# Patient Record
Sex: Male | Born: 1935 | Race: White | Hispanic: No | Marital: Married | Smoking: Former smoker
Health system: Southern US, Community
[De-identification: ages and names within clinical notes are randomized; demographics above are authoritative.]

## PROBLEM LIST (undated history)

## (undated) DIAGNOSIS — I340 Nonrheumatic mitral (valve) insufficiency: Secondary | ICD-10-CM

## (undated) DIAGNOSIS — I10 Essential (primary) hypertension: Secondary | ICD-10-CM

## (undated) DIAGNOSIS — I4729 Other ventricular tachycardia: Secondary | ICD-10-CM

## (undated) DIAGNOSIS — N183 Chronic kidney disease, stage 3 unspecified: Secondary | ICD-10-CM

## (undated) DIAGNOSIS — Z7901 Long term (current) use of anticoagulants: Secondary | ICD-10-CM

## (undated) DIAGNOSIS — I472 Ventricular tachycardia, unspecified: Secondary | ICD-10-CM

## (undated) DIAGNOSIS — I639 Cerebral infarction, unspecified: Secondary | ICD-10-CM

## (undated) DIAGNOSIS — M1990 Unspecified osteoarthritis, unspecified site: Secondary | ICD-10-CM

## (undated) DIAGNOSIS — A0472 Enterocolitis due to Clostridium difficile, not specified as recurrent: Secondary | ICD-10-CM

## (undated) DIAGNOSIS — I5022 Chronic systolic (congestive) heart failure: Secondary | ICD-10-CM

## (undated) DIAGNOSIS — C44611 Basal cell carcinoma of skin of unspecified upper limb, including shoulder: Secondary | ICD-10-CM

## (undated) DIAGNOSIS — C189 Malignant neoplasm of colon, unspecified: Secondary | ICD-10-CM

## (undated) DIAGNOSIS — I482 Chronic atrial fibrillation, unspecified: Secondary | ICD-10-CM

## (undated) DIAGNOSIS — K922 Gastrointestinal hemorrhage, unspecified: Secondary | ICD-10-CM

## (undated) DIAGNOSIS — I2510 Atherosclerotic heart disease of native coronary artery without angina pectoris: Secondary | ICD-10-CM

## (undated) DIAGNOSIS — I420 Dilated cardiomyopathy: Secondary | ICD-10-CM

## (undated) DIAGNOSIS — E785 Hyperlipidemia, unspecified: Secondary | ICD-10-CM

## (undated) DIAGNOSIS — R7303 Prediabetes: Secondary | ICD-10-CM

## (undated) DIAGNOSIS — I119 Hypertensive heart disease without heart failure: Secondary | ICD-10-CM

## (undated) DIAGNOSIS — I255 Ischemic cardiomyopathy: Secondary | ICD-10-CM

## (undated) DIAGNOSIS — B951 Streptococcus, group B, as the cause of diseases classified elsewhere: Secondary | ICD-10-CM

## (undated) DIAGNOSIS — R7881 Bacteremia: Secondary | ICD-10-CM

## (undated) HISTORY — DX: Hyperlipidemia, unspecified: E78.5

## (undated) HISTORY — DX: Enterocolitis due to Clostridium difficile, not specified as recurrent: A04.72

## (undated) HISTORY — DX: Streptococcus, group b, as the cause of diseases classified elsewhere: B95.1

## (undated) HISTORY — DX: Ventricular tachycardia: I47.2

## (undated) HISTORY — DX: Hypertensive heart disease without heart failure: I11.9

## (undated) HISTORY — DX: Dilated cardiomyopathy: I42.0

## (undated) HISTORY — DX: Other ventricular tachycardia: I47.29

## (undated) HISTORY — DX: Gastrointestinal hemorrhage, unspecified: K92.2

## (undated) HISTORY — DX: Dilated cardiomyopathy: I25.5

## (undated) HISTORY — DX: Long term (current) use of anticoagulants: Z79.01

## (undated) HISTORY — PX: INGUINAL HERNIA REPAIR: SUR1180

## (undated) HISTORY — DX: Bacteremia: R78.81

## (undated) HISTORY — DX: Atherosclerotic heart disease of native coronary artery without angina pectoris: I25.10

## (undated) HISTORY — PX: CORONARY ARTERY BYPASS GRAFT: SHX141

## (undated) HISTORY — DX: Chronic systolic (congestive) heart failure: I50.22

## (undated) HISTORY — DX: Prediabetes: R73.03

## (undated) HISTORY — DX: Nonrheumatic mitral (valve) insufficiency: I34.0

## (undated) HISTORY — DX: Basal cell carcinoma of skin of unspecified upper limb, including shoulder: C44.611

## (undated) HISTORY — DX: Ventricular tachycardia, unspecified: I47.20

## (undated) HISTORY — DX: Malignant neoplasm of colon, unspecified: C18.9

## (undated) HISTORY — DX: Chronic kidney disease, stage 3 unspecified: N18.30

## (undated) HISTORY — DX: Chronic atrial fibrillation, unspecified: I48.20

## (undated) HISTORY — PX: CATARACT EXTRACTION W/ INTRAOCULAR LENS  IMPLANT, BILATERAL: SHX1307

## (undated) HISTORY — DX: Cerebral infarction, unspecified: I63.9

## (undated) HISTORY — DX: Chronic kidney disease, stage 3 (moderate): N18.3

---

## 2000-05-03 ENCOUNTER — Encounter: Admission: RE | Admit: 2000-05-03 | Discharge: 2000-05-03 | Payer: Self-pay | Admitting: Family Medicine

## 2000-05-03 ENCOUNTER — Encounter: Payer: Self-pay | Admitting: Family Medicine

## 2000-05-06 ENCOUNTER — Encounter (INDEPENDENT_AMBULATORY_CARE_PROVIDER_SITE_OTHER): Payer: Self-pay | Admitting: Specialist

## 2000-05-06 ENCOUNTER — Encounter: Payer: Self-pay | Admitting: Emergency Medicine

## 2000-05-06 ENCOUNTER — Inpatient Hospital Stay (HOSPITAL_COMMUNITY): Admission: EM | Admit: 2000-05-06 | Discharge: 2000-06-02 | Payer: Self-pay | Admitting: Emergency Medicine

## 2000-05-07 HISTORY — PX: CARDIAC VALVE REPLACEMENT: SHX585

## 2000-05-07 HISTORY — PX: VALVE REPLACEMENT: SUR13

## 2000-05-17 ENCOUNTER — Encounter: Payer: Self-pay | Admitting: Cardiothoracic Surgery

## 2000-05-18 ENCOUNTER — Encounter: Payer: Self-pay | Admitting: Cardiothoracic Surgery

## 2000-05-19 ENCOUNTER — Encounter: Payer: Self-pay | Admitting: Cardiothoracic Surgery

## 2000-05-20 ENCOUNTER — Encounter: Payer: Self-pay | Admitting: Cardiothoracic Surgery

## 2000-05-21 ENCOUNTER — Encounter: Payer: Self-pay | Admitting: Cardiothoracic Surgery

## 2000-05-22 ENCOUNTER — Encounter: Payer: Self-pay | Admitting: Cardiothoracic Surgery

## 2000-05-28 ENCOUNTER — Encounter: Payer: Self-pay | Admitting: Surgery

## 2000-05-29 ENCOUNTER — Encounter: Payer: Self-pay | Admitting: Cardiothoracic Surgery

## 2000-08-03 ENCOUNTER — Encounter (HOSPITAL_COMMUNITY): Admission: RE | Admit: 2000-08-03 | Discharge: 2000-11-01 | Payer: Self-pay | Admitting: Cardiology

## 2000-08-04 ENCOUNTER — Encounter (HOSPITAL_COMMUNITY): Admission: RE | Admit: 2000-08-04 | Discharge: 2000-11-02 | Payer: Self-pay | Admitting: Cardiology

## 2000-10-29 ENCOUNTER — Inpatient Hospital Stay (HOSPITAL_COMMUNITY): Admission: EM | Admit: 2000-10-29 | Discharge: 2000-11-05 | Payer: Self-pay | Admitting: *Deleted

## 2000-11-09 ENCOUNTER — Ambulatory Visit (HOSPITAL_COMMUNITY): Admission: RE | Admit: 2000-11-09 | Discharge: 2000-11-09 | Payer: Self-pay | Admitting: Cardiology

## 2004-08-05 ENCOUNTER — Encounter: Admission: RE | Admit: 2004-08-05 | Discharge: 2004-08-05 | Payer: Self-pay | Admitting: Family Medicine

## 2004-11-05 ENCOUNTER — Ambulatory Visit (HOSPITAL_COMMUNITY): Admission: RE | Admit: 2004-11-05 | Discharge: 2004-11-06 | Payer: Self-pay | Admitting: General Surgery

## 2004-11-05 ENCOUNTER — Encounter (INDEPENDENT_AMBULATORY_CARE_PROVIDER_SITE_OTHER): Payer: Self-pay | Admitting: Specialist

## 2007-01-29 ENCOUNTER — Emergency Department (HOSPITAL_COMMUNITY): Admission: EM | Admit: 2007-01-29 | Discharge: 2007-01-30 | Payer: Self-pay | Admitting: Emergency Medicine

## 2010-07-25 NOTE — Op Note (Signed)
NAMEKRISS, Alfred Dennis              ACCOUNT NO.:  [id number]   MEDICAL RECORD NO.:  [id number]          PATIENT TYPE:  AMB   LOCATION:  SDS                          FACILITY:  [facility]   PHYSICIAN:  Ollen Gross. Vernell Morgans, M.D. DATE OF BIRTH:  Jun 23, 1923   DATE OF PROCEDURE:  11/05/2004  DATE OF DISCHARGE:                                 OPERATIVE REPORT   PREOPERATIVE DIAGNOSIS:  Right inguinal hernia.   POSTOPERATIVE DIAGNOSIS:  Right indirect inguinal hernia.   OPERATION/PROCEDURE:  Right inguinal hernia repair with mesh.   SURGEON:  Ollen Gross. Carolynne Edouard, M.D.   ANESTHESIA:  Local and IV sedation.   DESCRIPTION OF PROCEDURE:  After informed consent was obtained, the patient  was brought to the operating room and placed in the supine position on the  operating table.  After adequate sedation had been achieved and the  patient's right groin and abdomen was prepped with Betadine and draped in  the usual sterile manner.  The right colon was infiltrated with 0.25%  Marcaine with epinephrine and a small incision was made from the edge of the  pubic tubercle on the right towards the anterior superior iliac spine for a  distance of about 5 cm.  This incision was carried down through the skin and  subcutaneous tissue sharply with the electrocautery.  Small bridging vein  was encountered that was clamped with hemostats, divided and ligated with 3-  0 silk ties.  The rest of the dissection was carried through the  subcutaneous tissue sharply with the electrocautery until the fascia of the  external oblique was encountered.  The fascia of the external oblique was  opened along its fibers with a 15-blade knife and Metzenbaum scissors  towards the apex of the external ring.  Blunt dissection was then carried  out around the cord structures at the edge of the pubic tubercle until the  cord structures could be surrounded between two fingers.  A half-inch  Penrose drain was then placed around the cord  structures for retraction  purposes.  The floor of the canal looked good.  Blunt hemostat dissection  and sharp dissection with the electrocautery was carried out of the cord  structures to skeletonize the cord structures until the hernia sac was  identified and gently separated from the rest of the cord structures.  The  distal end of the sac was densely adhesions to the vessels and the vas and  was roughed adherent to them.  The proximal sac was divided sharply with the  electrocautery and the sac was ligated near its base with a 2-0 silk suture  ligature.  Once this was accomplished, a piece of 3 x 6 Duval mesh was  chosen and cut to fit.  The mesh was sewed inferiorly to the shelving edge  of the inguinal ligament with some running 2-0 Prolene stitch.  Superiorly  the mesh was sewed to the muscular aponeurotic strength layer of the  transversalis with interrupted 3-0 Prolene vertical mattress stitches.  Tails were cut in the mesh laterally.  These tails were wrapped around the  cord structures  and anchored laterally to the shelving edge of the inguinal  ligament with an interrupted 2-0 Prolene stitch.  Once this was  accomplished, the mesh was in good position without any tension.  The wound  was irrigated with copious amounts of saline.  The fascia of the external  oblique was then reapproximated with a running 2-0 Vicryl stitch.  The rest  of the 0.25% Marcaine was used to infiltrate the rest of the wound.  The  subcutaneous fascia was closed with a running 3-0 Vicryl stitch and the skin  was closed with a running 4-0 Monocryl subcuticular stitch.  Benzoin and  Steri-Strips and sterile dressings were applied.  The patient tolerated the  procedure well.  At the end of the case all needle, sponge and instrument  counts were correct. The patient was then awakened and taken to the recovery  room in stable condition.      Ollen Gross. Vernell Morgans, M.D.  Electronically Signed     PST/MEDQ   D:  11/05/2004  T:  11/05/2004  Job:  [id number]

## 2010-07-25 NOTE — Discharge Summary (Signed)
[place]. [facility]  Patient:    Alfred Dennis, Alfred Dennis                       MRN: [id number] Adm. Date:  [id number] Disc. Date: [id number] Attending:  Mikey Bussing Dictator:   Dominica Severin, P.A. CC:         Mikey Bussing, M.D., [facility] Office             Armanda Magic, M.D.             Kelli Hope, M.D.                           Discharge Summary  ADDENDUM TO THE PREVIOUS DISCHARGE SUMMARY [phone]  DATE OF BIRTH:  08/19/1923  BRIEF NOTE:  This patient was initially was anticipated for discharge on May 28, 2000, although his INR at that time was not quite therapeutic, and then it was determined that he would be discharged when his INR was greater than 2.5. Later that day, on May 28, 2000, the patient had some shortness of breath, was found on a chest x-ray to have IV fluid overload and was given IV Lasix, and his shortness of breath subsequently subsided, although later on that evening, the patient had a sudden change in mental status and with ______ of his left arm.  The patient had previously been doing well, was awake, alert, and ambulating.  At that time, he had a CT scan to rule out the bleed which eventually was proven to be true, and the patient was then started on some IV heparin for further anticoagulation.  Later that evening, his left-sided weakness had eventually improved.  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  The patient gradually began to progress from that event, and he eventually regained his strength and did have some confusion, although that has since resolved.  His left-sided weakness has undoubtedly improved, and there is very little residual effect of his right brain CVA at this time.  The patient was continued to be anticoagulated and followed.  He did not have any  more cardiac compromise or respiratory compromise.  He was seen daily by cardiac rehab phase 1 and eventually began to progress to the point where he was prestroke, and the patient is going to be discharged today on June 02, 2000.  His INR is therapeutic at 3.6, and the patients discharge instructions have remained the same.  DISCHARGE MEDICATIONS:  1. Aspirin 81 mg daily.  2. Darvocet-N 100 1-2 tablets every 4-6 hours as needed for pain.  3. Toprol XL 50 tablet.  The patient is to take 1-1/2 tablets daily for a     total of 75 mg a day.  4. Coumadin 2.5 mg tablets.  The patient is to take that as directed.  He is     to take 2.5 mg today on March 27 and have his blood work drawn tomorrow by     a Patent examiner, and he will get further instructions from Dr.     Malachy Mood office for further Coumadin dosages.  The plan currently is to     have 2.5 mg on odd days and 5 mg on even days.  5. Niferex 1 tablet daily.  6. Altace 5 mg 1 daily.  7. Multivitamin daily.  8. Stool softener as needed.  9. Lasix 40 mg 1 daily x 7 days. 10. Potassium chloride 20 mEq 1 daily x 7 days.  Again, the patient has a PT INR blood work appointment on June 03, 2000, by the home health nurse and home health nurse to fax the results to Dr. Malachy Mood office and to adjust the Coumadin dose as needed.  The patient is to have an appointment with Dr. Mayford Knife next week on April 4, at 10:15 a.m. for a chest x-ray and then to see Dr. Mayford Knife and follow up with Dr. Donata Clay with the chest x-ray on Friday, June 18, 2000, at 10 a.m.  The patient is also to follow up with Dr. Thad Ranger office, the neurology department, and the patient was given the number to call to follow up with them in 3-4 weeks. DD:  06/02/00 TD:  06/03/00 Job: [id number]

## 2010-07-25 NOTE — Op Note (Signed)
[place]. [facility]  Patient:    AXXEL, GUDE Visit Number: [id number] MRN: [id number]          Service Type: MED Location: [phone] Attending Physician:  Armanda Magic Proc. Date: 11/05/00 Admit Date:  10/29/2000   CC:         Armanda Magic, M.D.   Operative Report  DIAGNOSIS:  Gross hematuria.  PROCEDURE:  Flexible bedside cystourethroscopy.  SURGEON:  Lucrezia Starch. Ovidio Hanger, M.D.  ANESTHESIA:  Local Xylocaine jelly.  COMPLICATIONS:  None.  INDICATION FOR PROCEDURE:  Mr. Monical is a very nice white male who has cardiac valve disease.  He has been on anticoagulation, and his INR was prolonged and he developed gross hematuria.  He has undergone a CT scan of the abdomen and pelvis, which revealed no significant urinary tract problems.  His urine has cleared but with the gross hematuria, it was felt that flexible bedside cystourethroscopy was indicated.  He understands the risks, benefits, and alternatives, and wishes to proceed.  DESCRIPTION OF PROCEDURE:  After the patient was supine in his bed, the penis was prepped and draped with Betadine solution.  Approximately 10 cc of 2% Xylocaine was injected intraurethrally, and then flexible cystourethroscopy was performed with an Olympus flexible cystourethroscope.  He was noted to have moderate trilobar hypertrophy and grade 1 trabeculation.  Efflux of cleft arm urine was noted from the normally-placed ureteral orifices bilaterally. There was some urate debris throughout the bladder, really no inflammation, no blood clots, and no lesions were noted.  The flexible cystourethroscope was visually removed, and the procedure was concluded.  IMPRESSION:  Benign prostatic hypertrophy, otherwise essentially normal flexible cystourethroscopy.  He will follow up in three to four weeks in the office.  If the bleeding continues, further hematuria workup will be indicated, and he will call as  needed. Attending Physician:  Armanda Magic DD:  11/05/00 TD:  11/05/00 Job: [id number]

## 2010-07-25 NOTE — Consult Note (Signed)
[place]. [facility]  Patient:    Alfred Dennis, Alfred Dennis                       MRN: [id number] Proc. Date: 05/06/00 Adm. Date:  [id number] Disc. Date: [id number] Attending:  Jetty Duhamel T Dictator:   Gwenlyn Perking, M.D.                          Consultation Report  PROBLEM LIST:  1. Atrial fibrillation with rapid ventricular response.  2. Congestive heart failure.  3. Remote history of smoking, quit 35 years ago.  4. Unknown lipid status.  5. Recent history of upper respiratory infection versus pulmonary edema from     congestive heart failure.  CONSULTATION REPORT:  Asked by Dr. Ebbie Latus hospitalist, to evaluate this patient with atrial fibrillation.  HISTORY:  In brief, this patient is a 77 year old, white male with no prior medical problems, who presented to his primary physician at [facility], with a dry cough approximately 5 days ago.  He was diagnosed with bronchitis at that time and prescription was given for a Z-Pak.  A chest x-ray was also obtained at that time.  When chest x-ray came back, however, it was apparent that the patient had, in fact, CHF, and Lasix was called in to the patient.  The patient was called in again today to the office for a recheck.  Found to be worse at that time and in atrial fibrillation.  The patient was therefore transferred to the emergency department for further evaluation and treatment and admission.  The patient has no past medical history to speak of.  PAST MEDICAL HISTORY:  None.  PAST SURGICAL HISTORY:  None.  MEDICATIONS:  None.  SOCIAL HISTORY:  No alcohol use.  Remote history of smoking one pack per day x 10 years, quit 35 years ago.  ALLERGIES:  No known drug allergies.  FAMILY HISTORY:  Noncontributory.  REVIEW OF SYSTEMS:  Negative except for dry cough.  No hemoptysis.  No melena. No hematochezia.  No fevers.  No chills.  No weight loss.  No weight gain except over the last two days,  approximately 3 pounds as reported by the patient.  Cardiovascular:  No orthopnea.  No chest pain.  Increased dyspnea on exertion over the last five days, now with shortness of breath at rest.  No paroxysmal nocturnal dyspnea.  No palpitations.  PHYSICAL EXAMINATION:  VITAL SIGNS:  Heart rate 105, blood pressure 150/80.  O2 saturations 90% on 2 L.  GENERAL:  Well-developed, well-nourished, 77 year old, white male, alert and oriented, anxious, diaphoretic, in moderate respiratory distress.  HEENT:  Normocephalic, atraumatic.  Pupils equally round and reactive to light and accommodation.  Extraocular muscles are intact.  Ears, nose and throat are grossly within normal limits.  NECK:  Positive jugular venous distention to the level of the jaw while the patient is sitting up at approximately 60 degrees of elevation of his head.  CARDIOVASCULAR:  Tachycardic, irregularly irregular rate with 3/6 systolic murmur best appreciated at the apex radiating to his axilla.  PMI is enlarged and laterally displaced.  RESPIRATORY:  Diffuse inspiratory rales.  Paradoxical abdominal respiratory movements.  Positive accessory respiratory muscle use.  Slight increase of his expiratory phase.  ABDOMEN:  Soft, distended, nontender, with normoactive bowel sounds.  EXTREMITIES:  There is 2+ pitting edema bilateral lower extremity to level of knees.  LABORATORY:  Sodium 138, potassium 4.4, chloride  105, CO2 25, BUN 30, creatinine 1.2, glucose 119.  White blood cell count 11.9.  H&H was 14.2 and 43.3.  Platelets 172.  CK and CK-MB is pending.  Troponin I is 0.01.  Chest x-ray:  Moderate CHF.  Very impressive cardiomegaly.  ASSESSMENT:  This 77 year old, white male in florid congestive heart failure in atrial fibrillation.  Onset of the atrial fibrillation is unknown, but suspect it was fairly recent.  The patient also with murmur of mitral regurgitation which may be the underlying etiology driving  congestive heart failure and atrial fibrillation.  However, other possibilities include ischemic cardiomyopathy and hypertensive cardiomyopathy.  The patient is currently stable but still with a heart rate over 100.  The patient also in considerable respiratory distress secondary to pulmonary edema.  PLAN:  1. Admit this patient to CCU/ICU.  2. Rate control with Cardizem and digoxin IV.  3. Diuresis with IV Lasix.  4. Consider further preload reduction with IV nitroglycerin.  5. Will rule out patient for myocardial infarction with serial cardiac     enzymes.  6. Echo 2-D to assess left ventricular function, as well as severity of     mitral valve regurgitation.  Discuss plan of care with attending, Dr. Armanda Magic. DD:  05/06/00 TD:  05/07/00 Job: [id number]

## 2010-07-25 NOTE — Op Note (Signed)
[place]. [facility]  Patient:    Alfred Dennis, Alfred Dennis                       MRN: [id number] Proc. Date: 05/17/00 Adm. Date:  [id number] Disc. Date: [id number] Attending:  Mikey Bussing CC:         Mikey Bussing, M.D.  Department of Anesthesia   Operative Report  PROCEDURE:  Intraoperative transesophageal echocardiographic evaluation.  OPERATING SURGEON:  Mikey Bussing, M.D.  HISTORY:  Alfred Dennis is a 77 year old white male with a diagnosis of three-vessel coronary artery disease and mitral valve disease, who is scheduled for mitral valve repair/replacement and coronary artery bypass grafting, and for whom intraoperative echocardiographic evaluation is required as part of medical-surgical management.  DESCRIPTION OF PROCEDURE:  After the induction of general anesthesia and stabilization of vital signs, the omniplane echocardiograhpic probe was passed orally through the esophagus and transgastric, intragastrically, the probe head turned cephalad, and interrogation begun.  Beginning at the apex, multiple short axis views were taken demonstrating generalized global hypokinesia of a mildly hypertrophic myocardium.  The probe head was brought continuously cephalad until the mid-transgastric views began to demonstrate the inferior aspect of the mitral valve.  Using color Doppler, there was evidence of significant regurgitation throughout most of the midportion of the mitral valve.  The long axis view of the left ventricle demonstrated intact chordae tendineae and intact papillary muscles.  At that view also, a significant regurgitant jet was seen emanating from underneath the posterior leaflet, rolling directly over the anterior leaflet, striking and rolling up the wall septally, inferoseptal wall.  The mitral valve was then imaged in successive views and rotations demonstrating generalized billowing and mild prolapse of the both anterior and  posterior leaflets with a significant overriding prolapse of the midportion of the posterior leaflet, producing the significant regurgitant jet as noted.  The left atrium was significantly enlarged and by continuous-wave Doppler, there was significant high velocity flow reversal in the pulmonary veins.  Taken together, these demonstrated 4+/severe mitral regurgitation.  The aortic valve was then imaged and demonstrated to be having three leaflets, which were thickened, but there was no evidence of stenosis or regurgitation.  The tricuspid valve was imaged through the right ventricular outflow tract views and demonstrated two to three small regurgitant jets, probably from the back pressure of the incompetent mitral valve, producing a relative pressure overload to the right side.  Both the interventricular and interatrial septa were intact.  Dr. Donata Clay decided after intraoperatively noting that in addition to significant overriding prolapse of the middle scallop of the posterior leaflet, both anterior and posterior scallops and A1 and A2 were also significantly prolapsed, and therefore he elected to do a mitral valve replacement rather than repair because there was a generalized prolapsing of the valve.  Probe was put on standby, and the operation proceeded.  After the operation was complete, the heart was refilled, restarted, and an interrogation repeated. This time the left ventricle was considerably more dynamic globally.  The synthetic mitral valve, which was a bileaflet St. Jude, was operating properly.  Both regurgitant cleaning jets were seen clearly.  There was no paravalvular leakage.  The aortic and tricuspid valve were functioning normally.  FINAL DIAGNOSES: 1. Successful replacement of the mitral valve with a properly-operating St.    Jude valve replacement. 2. Successful multiple-vessel coronary artery bypass grafting with global    improvement in the myocardial  function. DD:  05/17/00 TD:  05/18/00 Job: [id number]

## 2010-07-25 NOTE — Consult Note (Signed)
[place]  Patient:    Alfred Dennis, Alfred Dennis                       MRN: [id number] Proc. Date: 05/11/00 Adm. Date:  [id number] Disc. Date: [id number] Attending:  Jetty Duhamel T CC:         Gretta Arab Valentina Lucks, M.D., [facility]  Armanda Magic, M.D., [facility]  CVTS Office   Consultation Report  REASON FOR CONSULTATION:  Severe mitral regurgitation with congestive heart failure, significant two-vessel coronary artery disease.  PRIMARY CARE PHYSICIAN:  Gretta Arab. Valentina Lucks, M.D.  REQUESTING PHYSICIAN:  Armanda Magic, M.D.  CHIEF COMPLAINT:  Shortness of breath.  HISTORY OF PRESENT ILLNESS:  The patient is a 77 year old white male with prior history of cardiac disease who presented with symptoms of congestive heart failure and was admitted to the hospital on February 28 with rapid atrial fibrillation and pulmonary edema.  The patient has had preceding progressive dyspnea on exertion with orthopnea and had been treated with antibiotics and Lasix as an outpatient; however, he returned without improvement and was found to be in rapid atrial fibrillation with pulmonary edema on x-ray.  He was admitted for further therapy and evaluation.  The patient denied any chest pain, presyncope, or palpitation.  He was treated with diuretics and digoxin, and his heart rate was controlled.  He was placed on heparin and ruled out for MI with negative enzymes.  A cardiology consultation was completed by Dr. Armanda Magic who performed a transesophageal 2-D echocardiogram which demonstrated 3 to 4+ mitral regurgitation with probable prolapse of the posterior leaflet of the mitral valve, no significant AI or AF, global left ventricular dilatation, and an ejection fraction of 45%.  The patient then underwent left heart catheterization today which demonstrated 60% LAD stenosis, 80% stenosis of the first diagonal, and 70 to 80% stenosis of the OM-2 with a normal  right coronary.  Left ventricular end diastolic pressure was 20 mmHg, and ejection fraction was calculated at 45%.  Due to the patients severe mitral regurgitation and two vessel coronary artery disease, he was felt to be a candidate for surgical therapy, and a consultation was requested by Dr. Mayford Knife.  PAST MEDICAL HISTORY:   No prior operations or chronic medical illnesses.  PAST SURGICAL HISTORY:  No prior surgical procedures except for dental extractions.  MEDICATIONS:  None.  ALLERGIES:  None.  SOCIAL HISTORY:  The patient is married and has a son and a daughter who live in the state.  He stopped smoking 35 years ago and is a retired Psychologist, educational.  He denies significant alcohol use.  FAMILY HISTORY:  No history of rheumatic fever, valvular heart disease, or premature coronary disease.  REVIEW OF SYSTEMS:  The patient states he gained 2 to 3 pounds in weight prior to his presentation in heart failure.  He denies any fever, chills, productive cough, or night sweats.  He denies any TIA symptoms or CVA.  There is no history of claudication, DVT, or thrombophlebitis.  GI history is negative for dysphagia or blood per rectum.  Pulmonary history is negative for asthma, hemoptysis, or history of abnormal chest x-ray.  He has no hematologic history of bleeding, diaphysis, or easy bruisability.  He denies any history of depression.  Otherwise Review of Systems is negative.  PHYSICAL EXAMINATION:  VITAL SIGNS:  He is 5 feet 8 inches tall, weighs 185 pounds.  Blood pressure is 110/70, heart  rate 88 and irregular in atrial fibrillation by the telemetry monitor.  Oxygen saturation is 96 to 97% on room air.  GENERAL:  Appearance is that of an elderly white male in his hospital room following cardiac catheterization.  He is hard of hearing without his hearing aid.  I examined the patient with his wife present.  HEENT:  Full EOMs.  Normocephalic.  He has partial plates and  some necrotic teeth, especially in the upper maxillary region.  Pharynx is clear.  NECK:  Supple without JVD, thyromegaly, carotid bruit, or mass.  LYMPHATICS:  Negative palpable adenopathy in the cervical, supraclavicular, or axillary regions.  LUNGS:  Bibasilar rales.  There are no thoracic deformities.  CARDIAC:  Grade 4/6 holosystolic murmur at the left lower sternal border radiating to the left axilla.  There is no S3 gallop.  The heart rate is irregular.  ABDOMEN:  Soft, nontender, without masse, organomegaly, or abdominal bruit.  EXTREMITIES:  Reveal 2+ edema at each ankle.  No clubbing or swollen tender joints.  VASCULAR:  Exam reveals 2+ pulses bilaterally in the radial, femoral, and pedal regions.  There is no evidence of chronic venous insufficiency.  SKIN:  Without rash or lesion.  RECTAL:  Exam is deferred.  NEUROLOGIC:  Alert and oriented x 3 with full motor function limited to exam in bed as he is immobilized following cardiac catheterization.  ASSESSMENT/PLAN:  I reviewed his transesophageal 2-D echocardiogram and cardiac catheterization today.  I agree with the interpretation of severe mitral regurgitation, moderate LV dysfunction with global left ventricular dilatation and severe two-vessel coronary disease.  He would benefit from mitral valve repair/replacement and coronary artery bypass grafting to the left anterior descending artery, first diagonal, and obtuse marginal vessels.  Prior to surgery, he will need a dental consultation and treatment of any active dental disease.  Right hear catheterization would be also optimal prior to any major valvular reconstruction.  I discussed the plan and recommendations with the patient and his wife, and they understand and are in agreement to proceed.  Surgery will be scheduled after completion of the above studies.  Thank you very much for this consultation.  DD:  05/11/00 TD:  05/11/00 Job:  [id number]

## 2010-07-25 NOTE — Op Note (Signed)
[place]  Patient:    Alfred Dennis, Alfred Dennis                       MRN: [id number] Proc. Date: 05/17/00 Adm. Date:  [id number] Disc. Date: [id number] Attending:  Mikey Bussing CC:         Armanda Magic, M.D.   Operative Report  PREOPERATIVE DIAGNOSIS:  Two vessel coronary disease, severe mitral regurgitation, class 4 congestive heart failure, atrial fibrillation.  POSTOPERATIVE DIAGNOSIS:  Two vessel coronary disease, severe mitral regurgitation, class 4 congestive heart failure, atrial fibrillation.  OPERATION PERFORMED:  Coronary artery bypass grafting x 3 with mitral valve replacement (left internal mammary artery to left anterior descending, saphenous vein graft to ramus intermediate, saphenous vein graft to obtuse marginal 2, mitral valve replacement with a 33 mm St. Jude prosthesis).  SURGEON:  Mikey Bussing, M.D.  ASSISTANT:  Sherrie George, P.A.  ANESTHESIA:  General.  ANESTHESIOLOGIST:  Edwin Cap. Zoila Shutter, M.D.  INDICATIONS FOR PROCEDURE:  The patient is a 77 year old male who presented in congestive heart failure and pulmonary edema and atrial fibrillation.  He was ruled out for myocardial infarction and echo demonstrated significant mitral regurgitation.  Cardiac catheterization by  Armanda Magic, M.D. demonstrated severe two-vessel disease with 80% LAD stenosis, 80% stenosis of the ramus intermediate and 90% stenosis of the obtuse marginal 2 with severe mitral regurgitation.  The patient was referred for coronary artery bypass grafting and mitral valve replacement or repair.  Prior to the operating room I examined the patient in his hospital room and reviewed the results of the cardiac catheterization and echo with the patient and his wife.  I discussed the indications and benefits of coronary artery bypass surgery and mitral valve replacement or repair.  I discussed the major aspects of the operation including the  choice of conduit, the use of general anesthesia and cardiopulmonary bypass, the location of the surgical incisions, and the preference to performing valve repair if possible.  The patient and family understood that if a valve repair was not possible, a mitral valve replacement with a mechanical valve would be used with lifelong commitment to Coumadin anticoagulation.  They understood that we would attempt to convert the patient back to sinus rhythm but he may continue with atrial fibrillation. They understood the risks that are associated with this operation during our discussion and we discussed the risks of MI, CVA, bleeding, infection and death.  The patient understood these implications, the alternatives to surgical therapy for his coronary and valvular disease and agreed to proceed with the operation as planned under what I felt was an informed consent.  OPERATIVE FINDINGS:  The heart was dilated.  The coronaries were small and suboptimal vessels for grafting.  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  There was severe fibroelastic degeneration of the valve with significant prolapse with segments of A2, P2, and P3.  A valve repair was attempted but not possible.  DESCRIPTION OF PROCEDURE:  The patient was brought to the operating room and placed supine on the operating table there general anesthesia was induced under invasive hemodynamic monitoring.  The transesophageal echocardiogram demonstrated and documented severe mitral regurgitation.  The patient was prepped and draped as a sterile field and a median sternotomy was performed as the saphenous vein was harvested from the right leg.  The internal mammary artery was harvested as a pedicle graft from its origin at the subclavian vessels.  Heparin was administered and the ACT was documented as being therapeutic.  Through pursestrings placed in the ascending aorta and right atrium, the patient was cannulated and  placed on bypass.  A second IVC cannula was placed for bicaval drainage.  The coronaries were identified and the interatrial groove was dissected out.  Keepers were placed around both caval cannulas.  Cardioplegia cannulas were placed for both antegrade and retrograde delivery of cold blood cardioplegia.  The patient was cooled to 28 degrees and as the aortic crossclamp was applied, 700 cc total both antegrade and retrograde cold blood cardioplegia was administered with immediate cardioplegic arrest and septal temperature dropping less than 12 degrees. Topical iced saline slush was used to augment myocardial preservation and a pericardial insulator pad was used to protect the left phrenic nerve.  The distal coronary anastomoses were then performed.  The first distal anastomosis was to the ramus intermediate.  This was a 1.5 mm vessel with proximal 80% stenosis and a reversed saphenous vein was sewn end-to-side with running 7-0 Prolene.  The second distal anastomosis was to the OM2.  This was a small 1 mm vessel of the distal circumflex which had almost total proximal occlusion.  A reversed saphenous vein was sewn end-to-side with running 7-0 Prolene with adequate flow through the graft.  A third distal anastomosis was to the distal third of the LAD just before the distal diagonal bifurcation after which the LAD became very small.  The LAD was a 1.3 mm vessel and the left internal mammary artery pedicle was brought through an opening created in the left lateral pericardium and brought down on the LAD and sewn end-to-side with a running 8-0 Prolene.  There was good flow through the anastomosis after briefly flashing the anastomosis with release of the atraumatic vascular clamp on the mammary pedicle.  The pedicle was reclamped and secured to the epicardium with a 6-0 Prolene.  The patient was then readministered cardioplegia.  Attention was then directed to the mitral valve.  A left  atriotomy was performed and the atrial retractors were placed.  The mitral valve was difficult to expose due to its enlarged  redundant nature.  On valve analysis there was significant prolapse of the segments A2 with a ruptured primary chord, P2 with a ruptured chord, and P3 also with a ruptured chord.  After resection of the abnormal segments, the valve was nonrepairable.  The remainder of the anterior leaflet was removed and annular sutures of 2-0 Tevdek were placed around the annulus with pledgeted reinforcement.  Sixteen sutures were placed.  The annulus was sized to a 33 mm St. Jude valve and the sutures were placed through the sewing ring of the valve and the valve was seated and sutures were tied.  The valve was inspected and found to open and close well.  A balloon with Foley catheter was placed across the valve and the patient was rewarmed.  The atriotomy was closed and prior to tying the suture, air was evacuated from the heart through the Foley catheter as an LE vent as well as using the usual deairing maneuvers.  The Foley catheter was removed after the balloon was deflated and the atriotomy was tied.  The aortic crossclamp was removed and the vein grafts were perfused from the cardioplegia cannula off the aorta.  The heart resumed a spontaneous rhythm.  A partial occlusion clamp was placed on the ascending aorta and two proximal vein anastomoses were placed using a 4.4 mm punch and  running 6-0 Prolene. The partial clamp was removed and vein grafts were perfused.  Each had adequate flow and hemostasis was documented at the proximal and distal sites. The patient was rewarmed and reperfused.  Temporary pacing wires were applied. The retrograde cardioplegia cannula had been removed.  The inferior caval cannula was removed.  The patient was then weaned from cardiopulmonary bypass after the lungs were re-expanded and the ventilator was resumed.  The cardiac output was adequate  and the patient was given protamine and the cannulas were removed.  The patient remained on very low dose dopamine.  The mediastinum was irrigated with warm antibiotic irrigation.  The leg incisions were irrigated and closed in a standard fashion.  The pericardium was loosely reapproximated. Two mediastinal and left pleural chest tube were placed and brought out through separate incisions.  The sternum was reapproximated with interrupted steel wire.  The pectoralis fascia was closed with interrupted #1 Vicryl.  The subcuticular layer was closed with a running Vicryl and the skin was closed wtih a subcuticular.  Total cardiopulmonary bypass time was 260 minutes with aortic crossclamp time of 150 minutes. DD:  05/17/00 TD:  05/18/00 Job: [id number]

## 2010-07-25 NOTE — Consult Note (Signed)
[place]. [facility]  Patient:    Alfred Dennis, Alfred Dennis                       MRN: [id number] Proc. Date: 05/12/00 Adm. Date:  [id number] Disc. Date: [id number] Attending:  Jetty Duhamel T CC:         Mikey Bussing, M.D.; Armanda Magic, M.D.  Jetty Duhamel, M.D.; Addison Lank, D.D.S. (private dentist)   Consultation Report  DATE OF BIRTH:  02/05/1924  INTRODUCTION:  Alfred Dennis is a 77 year old, white male referred by Dr. Armanda Magic and Dr. Kathlee Nations Trigt for a dental consultation.  The patient with recent cardiac catheterization which revealed severe mitral regurgitation, congestive heart failure, and coronary artery disease.  The patient with anticipated mitral valve replacement/repair, as well as a coronary artery bypass graft heart surgery with Dr. Donata Clay.  The patient is now seen as part of a pre-heart valve surgery dental protocol to rule out dental infection prior to the surgery.  MEDICAL HISTORY:  1. Coronary artery disease.     A. Status post cardiac catheterization, May 11, 2000, which revealed        2-vessel disease and ejection fraction of approximately 45%.     B. Anticipated coronary artery bypass graft heart surgery with a mitral        valve replacement with Dr. Donata Clay.  2. Severe mitral regurgitation.     A. Status post transesophageal echocardiogram which revealed 3 to 4+        mitral regurgitation, global left ventricular dilatation, and ejection        fraction of approximately 45%.     B. Anticipated mitral valve replacement/repair along with a coronary        artery bypass graft as above.  3. Congestive heart failure.  4. Atrial fibrillation.  5. Hearing deficit with hearing aids.  6. Visual deficit with glasses.  ALLERGIES/ADVERSE DRUG REACTIONS:  None known.  MEDICATIONS:  (Per MAR)  1. Digoxin 0.25 mg daily.  2. Potassium chloride 40 mEq daily.  3. Enteric-coated aspirin 325 mg daily.  4. Lasix 40  mg daily.  5. Altace 5 mg daily.  6. Heparin as per protocol.  7. Diltiazem 125 mg per protocol.  8. Nitroglycerin sublingual as needed.  SOCIAL HISTORY:  The patient denies the use of alcohol.  Patient with remote history of smoking one pack per day x 10 years.  The patient quit smoking 35 years ago.  The patient is married with two children and two grandchildren. 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  FAMILY HISTORY:  Noncontributory.  FUNCTIONAL ASSESSMENT:  The patient was independent for all ADLs prior to this admission.  REVIEW OF SYSTEMS:  This was reviewed from the chart/health history assessment form - this admission.  DENTAL HISTORY:  CHIEF COMPLAINT:  The patient needed a dental consultation prior to the mitral valve replacement/coronary artery bypass graft heart surgery.  HISTORY OF PRESENT ILLNESS:  The patient with anticipated mitral valve replacement/coronary artery bypass graft surgery with Dr. Donata Clay.  The patient is now seen as part of a dental protocol to rule out dental infection prior to the heart surgery.  The patient currently denies a history of toothaches, swellings, or abscesses. The patient was last seen by the private dentist approximately 1 year ago for a root canal therapy.  The patient has not had a cleaning since then.  The  patient sees Dr. Addison Lank for regular dental care as needed.  The patient with a history of upper and lower partial dentures, but only wears the upper partial denture.  DENTAL EXAM:  GENERAL:  The patient is a well-developed, well-nourished white male in no acute distress.  VITAL SIGNS:  Blood pressure 142/91, pulse 86, temperature 96.  HEAD AND NECK:  There is no palpable lymphadenopathy.  The patient has bilateral TMJ subluxation on maximum opening, but denies acute symptoms.  PERIODONTAL:  The patient with chronic periodontitis with plaque and calculus accumulation/accretions, gingival  recession, incipient tooth mobility, and mild to moderate bone loss.  DENTITION:  The patient is missing tooth numbers 1, 2, 7, 10, 12, 13, 14, 15, 17, 18, 29, 30, 31, and 32.  DENTAL CARIES/SUBOPTIMAL RESTORATIONS:  The patient with multiple flexure lesions and is in need of evaluation for resin restorations by the private dentist of his choice.  ENDODONTIC:  The patient with no history of acute pulpitis symptoms.  The patient with a previous root canal therapy of tooth #6 with no apparent persistent periapical pathology or symptoms.  CROWN AND BRIDGE:  There are no crown and bridge restorations.  The patient could be evaluated for future crown and bridge therapy by the private dentist of his choice.  PROSTHODONTIC:  Patient with history of upper and lower partial dentures.  The patient indicates that the upper partial "fits okay," but that he has not worn the lower partial denture "for some time now."  The patient indicates that the lower partial denture was ill-fitting.  The patient did not bring the maxillary partial denture with him today.  OCCLUSION:  The patient with a poor occlusal scheme secondary to multiple missing teeth, supraeruption and drifting of the unopposed teeth into the edentulous areas, and lack of replacement of all missing teeth with clinically acceptable dental prostheses.  RADIOGRAPHIC INTERPRETATION:  (The panoramic x-ray was taken on May 11, 2000 and supplemented with a full series of dental radiographs in the hospital dental clinic).  Missing tooth numbers 1, 2, 7, 10, 12, 13, 14, 15, 17, 18, 29, 30, 31, and 32. There is mild to moderate bone loss.  There is supraeruption and drifting of the unopposed teeth into the edentulous areas.  There are multiple amalgam and resin restorations noted.  There is a previous root canal therapy of tooth #6 with no apparent persistent periapical pathology.  There was a questionable radiolucent area associated with  lower anterior teeth of the panoramic x-ray  but this was not seen on the periapical radiographs and did not correlate with the clinical exam.  ASSESSMENT:  1. Plaque and calculus accumulations.  2. Chronic periodontitis with mild to moderate bone loss.  3. Gingival recession.  4. Tooth mobility.  5. Multiple flexure lesions that need to be evaluated for resin restoration.  6. Missing teeth.  7. Supraeruption and drifting of the unopposed teeth into the edentulous     areas.  8. History of ill-fitting lower partial denture.  9. Lack of replacement of all missing teeth with clinically acceptable dental     prostheses. 10. Poor occlusal scheme secondary to number 6-9 as above. 11. Bilateral temporomandibular joint subluxation but without acute TMJ     symptoms. 12. Previous root canal therapy of tooth #6 with no apparent persistent     periapical pathology or symptoms. 13. Need for subacute bacteria endocarditis antibiotic prophylaxis prior to     invasive dental procedures.  PLAN/RECOMMENDATIONS:  1.  I discussed the risks, benefits, and complications of various treatment     options with the patient in relationship to his medical and dental     conditions, anticipate a mitral valve replacement/coronary artery bypass     graft heart surgery, and the risk for subacute bacteria endocarditis.  We     discussed periodontal therapy, dental restorations, crown and bridge     therapy, root canal therapy, implant therapy, and the fabrication of     dental prostheses as indicated.  The patient wishes to proceed with a     dental cleaning only and will then follow up with all the dental treatment     needs with a private dentist of his choice.  2. Discussion of findings with Dr. Armanda Magic, Dr. Kathlee Nations Trigt, and Dr.     Reather Littler as indicated concerning the ability/debility of the patient     to undergo dental treatment as planned, and the regimen of choice for     subacute bacteria  endocarditis.  3. Provision of written and verbal information on "Heart Valves and Mouth     Care" - today.  4. Discussion of findings with private dentist (Dr. Addison Lank) as indicated. DD:  05/13/00 TD:  05/13/00 Job: [id number]

## 2010-07-25 NOTE — Discharge Summary (Signed)
[place]. [facility]  Patient:    Alfred Dennis, Alfred Dennis Visit Number: [id number] MRN: [id number]          Service Type: MED Location: [id number] Attending Physician:  Armanda Magic Dictated by:   Anselm Lis, N.P. Admit Date:  10/29/2000 Discharge Date: 11/05/2000                             Discharge Summary  DATE OF BIRTH: 05/08/1923  CONSULTANTS: 1. Darci Needle, M.D., cardiology. 2. Lucrezia Starch. Earlene Plater, M.D., urologist. 3. Ollen Gross. Vernell Morgans, M.D., general surgery.  PRIMARY CARE Tamala Manzer:  Gretta Arab. Valentina Lucks, M.D.  NEUROLOGIST:  Kelli Hope, M.D.  CARDIOVASCULAR THORACIC SURGEON:  Kathlee Nations Suann Larry, M.D.  PROCEDURES: 1. Abdominal/pelvic CAT scan revealing some thickening of the    gallbladder wall with what was thought to be stones and    some stranding near the gallbladder. 2. Bedside cystoscopy by Dr. Gaynelle Arabian revealing BPH with    grade 1 trabeculation.  No bladder or urethral lesions    present.  Urate debris noted.  DISCHARGE DIAGNOSES: 1. Coagulopathy secondary to Coumadin with initial INR of 9.0,    3.4 at the time of discharge. 2. Hematuria secondary to #1. Bedside cystoscopy by Dr. Gaynelle Arabian revealing BPH without bladder or urethral lesions. 3. History of atrial fibrillation, chronic; identified February 2002. 4. Coronary atherosclerotic heart disease/severe MR (March 2002).  CABG x 3.    Mitral valve replacement St. Jude. 5. Chronic systemic anticoagulation secondary to prosthetic valve and chronic    atrial fibrillation.  PLAN: 1. The patient is discharged home in stable condition. 2. Activity as tolerated. 3. Diet:  Low fat, low cholesterol as tolerated.  DISCHARGE MEDICATIONS:  1. Multivitamin one p.o. q.d.  2. Baby aspirin 81 mg p.o. q.d.  3. Altace 5 mg p.o. q.d.  4. Lanoxin 0.25 mg p.o. q.d.  5. Metamucil (new) twice daily per package directions.  6. Zocor 20 mg p.o. q.d.  7. Toprol XL  (decreased) 50 mg tablet 1-1/2 tablets p.o. q.d.  8. Peri-Colace (new) 300/100 mg one tablet p.o. b.i.d.  Hold if     mushy stool.  9. Coumadin; (no Coumadin today November 05, 2000; changed, dose and     strength as directed. 10. Milk of Magnesia (new) 30 cc q.h.s. p.r.n. constipation.  FOLLOW-UP: 1. The patient is to stop by our clinic on the way home to get office    samples of Toprol. 2. Saturday, November 06, 2000, will stop by [facility] in the morning    to have protime/INR drawn, number [phone]).  Dr. Melburn Popper or Dr. Patty Sermons    will call and let the patient know how much Coumadin to take for    Saturday, Sunday and Monday.  The patient is to call our answering    service at [phone] if he has not heard back from Dr. Melburn Popper or    Dr. Patty Sermons by 5 or 6 Saturday evening. 3. Tuesday, November 09, 2000, at 8:30 a.m. gallbladder ultrasound at    [facility], first floor. NPO after midnight.  At 10:15 a.m. with    Dawn in our Coumadin Clinic [phone]) for protime/INR. 4. Friday, November 19, 2000, at 1:15 p.m. to see Dr. Mayford Knife [phone]). 5. Wednesday, December 01, 2000, at 11:45 a.m. to see Dr. Gaynelle Arabian,    [address], black  box/glass building.  HISTORY OF PRESENT ILLNESS: [name] is a pleasant 77 year old gentleman who is status post CABG x 3 and mitral valve replacement in March 2002. History of atrial fibrillation first noted preoperatively.  His postoperative course had been complicated by right brain stroke with a transient right-sided weakness which has totally resolved.  HOSPITAL COURSE: The patient was admitted with hematuria in the setting of protime of 49, INR 9.0.  His Coumadin was placed on hold as labs drifted downward.  An abdominal CAT scan (as part of work-up for hematuria) was significant for the presence of gallstones with an abnormal thick-wall gallbladder and pericholecystic fluid suggesting acute cholecystitis. Negative for renal calculi  or renal masses. No hydronephrosis.  Dr. Chevis Pretty, general surgery, was consulted who felt as the patient was clinically asymptomatic without fever nor elevation of the WBC.  Recommended getting an ultrasound of the gallbladder the week after discharge when his hematuria has been diagnosed and resolved.  He also states that the patient should follow up with him at any if any abdominal symptoms develop.  He is scheduled for outpatient abdominal ultrasound next Tuesday, November 09, 2000.  Dr. Earlene Plater performed a bedside flexible cystoscopy revealing BPH grade I trabeculation.  Urate debris was noted.  No bladder or urethral lesions were present.  He recommended Mr. Castilla follow up with him in three to foru weeks and p.r.n.  The patients Coumadin was restarted on November 01, 2000, for an INR of 1.7. He was initiated on Lovenox as well which continued until November 04, 2000.  On Friday, November 05, 2000, his pro time was 27.6, INR of 2.4.  Coumadin.  He will have a protime/INR drawn Saturday, November 06, 2000, with follow-up dosing of Coumadin pending those results by cross-covering doctor, Dr. Melburn Popper or Dr. Patty Sermons.  PAST MEDICAL HISTORY: 1. May 08, 2000, right cerebrovascular accident post bypass thought to be    probable embolic resulting in left-sided weakness which has resolved by the    time of discharge. 2. CASHD/severe mitral regurgitation May 17, 2000.  CABG x 3 with LIMA    to the LAD, SVG to the OM-II, SVG to ramus by Dr. Kathlee Nations Trigt.  Also,    mitral valve replacement was a #33 St. Jude mechanical valve.  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 3. Left ventricular dysfunction noted at the time of cardiac catheterization,    March 2002.  Prebypass surgery revealing EF of 42-45% with anterior/apical    and inferior apical akinesis.  On Lanoxin, ACE inhibitor, beta blocker. 4. Markedly dilated left atrium about the size of the  left ventricle noted at    the time of heart catheterization and prebypass surgery March 2002.  5. February 2002, CHF thought secondary to atrial fibrillation with    rapid ventricular response in the setting of severe mitral regurgitation,    prebypass surgery and premitral valve repair. 6. Atrial fibrillation, onset first noted February 2002.  He continues    with chronic atrial fibrillation with controlled ventricular response on    Toprol and Lanoxin. 7. On Zocor presumed secondary to dyslipidemia versus her risk factor    modification.  ALLERGIES:  No known drug allergies.  LABORATORY DATA:  Admission WBC 7.7, hemoglobin 12.9, hematocrit 38.2, platelet count 255,000.  At the time of discharge, hemoglobin 11, hematocrit 32.2, platelets 290,000.  Admission sodium 140, potassium 4.2, chloride 107, CO2 29, BUN 16, glucose 98,  creatinine 1.0, total bilirubin 0.4, alkaline phosphatase 91, SGOT 34, SGPT of 49, total protein 5.8, albumin 3.0, calcium of 8.8.  Admission pro time of 49, INR of 9, PTT of 167.  Coumadin was held. On October 30, 2000, pro time 45.2, INR 7.8, Coumadin held. On October 31, 2000, pro time 22.8, INR 2.4, Coumadin held.  November 01, 2000, protime of 18.1, INR of 1.7, Coumadin dose 7.5 mg. November 02, 2000, pro time 18.5, INR 1.7, Coumadin dose 10 mg.  On November 03, 2000, pro time 19.4, INR 1.9, Coumadin dose 7.5.  On November 04, 2000, pro time 23.8, INR 2.6, Coumadin dose 2 mg.  On November 05, 2000, pro time 27.6, INR 3.4, Coumadin held.  The patient had received Lovenox subcu November 01, 2000, through November 04, 2000.  Admission EKG revealed atrial fibrillation with controlled ventricular response at 84 beats per minute.  Nonischemic changes, occasional PVCs with right bundle branch block configuration.  Chest x-ray revealed no active disease.  Pelvic and abdominal CAT scan: Gallstones with an abnormal thick walled gallbladder and pericholecystic fluid suggesting  acute cholecystitis.  Hepatobiliary scan may be confirmatory. Negative for renal calculi or renal masses.  No hydronephrosis. Dictated by:   Anselm Lis, N.P. Attending Physician:  Armanda Magic DD:  11/05/00 TD:  11/05/00 Job: [id number]

## 2010-07-25 NOTE — Cardiovascular Report (Signed)
[place]. [facility]  Patient:    Alfred Dennis, Alfred Dennis                       MRN: [id number] Proc. Date: 05/13/00 Adm. Date:  [id number] Disc. Date: [id number] Attending:  Jetty Duhamel T CC:         Mikey Bussing, M.D.  Gretta Arab Valentina Lucks, M.D.   Cardiac Catheterization  PROCEDURE:  Left heart catheterization.  INDICATIONS:  Mitral regurgitation with mitral valve prolapse, need to assess PA pressures prior to mitral valve replacement.  COMPLICATIONS:  None.  This is a 77 year old white male who presented with congestive heart failure and was found to have severe mitral regurgitation with posterior mitral valve prolapse as well as two vessel coronary artery disease.  He now presents for right heart catheterization to evaluate pulmonary artery pressures prior to coronary artery bypass grafting with mitral valve repair or replacement.  DESCRIPTION OF PROCEDURE:  The patient is brought to the catheterization laboratory in the fasting, nonsedated state.  Informed consent was obtained. The patient was connected to continuous heart rate and pulse oximetry monitoring and intermittent blood pressure monitoring.  The right groin was prepped and draped in the usual sterile fashion.  1% lidocaine was used for local anesthesia.  Using the modified Seldinger technique, an 8 French sheath was placed in the right femoral vein.  Under fluoroscopic guidance a 7 French Swan-Ganz balloon catheter was placed into the right atrium.  Right atrial pressure was measured.  The catheter was then advanced into the right ventricle and right ventricular pressure was measured.  The catheter was then advanced under fluoroscopic guidance into the pulmonary artery and pulmonary artery pressure was measured.  It was then advanced into the pulmonary capillary wedge position.  After appropriate pressures were obtained, the balloon was deflated and cardiac outputs were performed using 10  cc for each injection.  The following results were obtained.  RESULTS:  Right atrial pressure mean 8 mmHg with an A wave of 13 mm, V wave 20 mm.  RV systolic pressure 32, RV diastolic pressure 14 mmHg, PA pressures 29/11 mmHg.  Pulmonary capillary wedge mean 18 mmHg, A wave 14 mmHg, V wave 26 mmHg.  Cardiac output 3, cardiac index 1.53.  IMPRESSION: 1. Normal PA pressures. 2. Mildly elevated pulmonary capillar wedge. 3. Mild to moderately reduced cardiac output.  PLAN:  Mitral valve replacement with CABG on Monday per Mikey Bussing, M.D. DD:  05/13/00 TD:  05/14/00 Job: [id number]

## 2010-07-25 NOTE — H&P (Signed)
[place]. [facility]  Patient:    Alfred Dennis, Alfred Dennis Visit Number: [id number] MRN: [id number]          Service Type: MED Location: [phone] Attending Physician:  Corlis Leak. Dictated by:   Anselm Lis, N.P. Adm. Date:  10/29/2000   CC:         Armanda Magic, M.D.  Gretta Arab Valentina Lucks, M.D.  Mikey Bussing, M.D.  Kelli Hope, M.D.   History and Physical  DATE OF BIRTH: April 11, 1923  PRIMARY CARE CARDIOLOGIST: Armanda Magic, M.D.  PRIMARY CARE Quynn Vilchis: Gretta Arab. Valentina Lucks, M.D.  CV/TS DOCTOR: Kathlee Nations Suann Larry, M.D.  NEUROLOGIST: Kelli Hope, M.D.  IMPRESSION (as dictated by Dr. Verdis Prime):  1. Coagulopathy secondary to Coumadin, with international normalized ratio of     9.0.  2. Hematuria secondary to #1.  3. Status post mitral valve replacement (St. Jude).  4. History of atrial fibrillation, chronic.  PLAN (as dictated by Dr. Verdis Prime):  1. Hold Coumadin.  2. Daily pro time/INR.  3. Resume Coumadin when INR less than 2.5.  4. IV fluids.  5. UA and culture.  6. CBC in a.m. q.d. x 2 days.  HISTORY OF PRESENT ILLNESS: [name] is a very pleasant 77 year old gentleman, who is status post CABG x 3, with mitral valve replacement in March 2002.  He had presented at that time with A fib and biventricular response with associated CHF.  A subsequent 2D echocardiogram revealed severe MR and markedly enlarged left atrium.  He had undergone CABG x 2 with mitral valve repair on May 13, 2000.  His postoperative course had been complicated by right brain stroke with transient left-sided weakness, which had totally resolved.  Since discharge he had been doing well at rehab and was feeling well save for some unintentional weight loss because of poor tolerance to low-salt diet.  Early this morning after voiding at approximately 3 a.m. he noted blood in the urine and presented to [facility], where pro time/INR were 9.0 and 49 respectively.  He was on chronic systemic anticoagulation secondary to A fib and prosthetic mitral valve (mechanical) and had recently been checked at our clinic and found to be subtherapeutic with slight increase in Coumadin dosing.  He has not been on antibiotics nor changed his diet significantly.  He has been following his Coumadin scheduling as prescribed.  He is without other signs of bleeding save for the hematuria.  PAST MEDICAL HISTORY:  1. Right CVA, May 08, 2000, post bypass, thought to be probable embolic     results in left-sided weakness, which resolved by the time of discharge.  2. CASHD/severe mitral regurgitation, (May 17, 2000) CABG x 3 with     LIMA to the LAD, SVG to OM #2, SVG to ramus by Dr. Kathlee Nations Trigt.     Also mitral valve replacement with #33 St. Jude mechanical valve.  The     valve has shown myxoid degeneration and fibrosis by pathology report.  3. LV dysfunction noted at the time of cardiac catheterization in March 2002     pre bypass surgery, revealing EF of 40-45% with anterior/apical and     inferior/apical akinesis.  On Lanoxin, ACE inhibitor, beta-blocker.  4. Markedly dilated left atrium about the size of the left ventricle noted at     the time of cardiac catheterization and pre bypass surgery March 2002.  5. February  2002, CHF secondary to atrial fibrillation with rapid ventricular     response in the setting of severe mitral regurgitation pre bypass surgery     and pre mitral valve repair.  6. Atrial fibrillation, onset first noted February 2002.   He continues with     chronic A fib with controlled ventricular response on Toprol and Lanoxin.  7. On Zocor, presumed secondary to dyslipidemia versus for risk factors     modification.  ALLERGIES: No known drug allergies.  MEDICATIONS:  1. Baby aspirin 81 mg p.o. q.d.  2. Multivitamin.  3. Zocor 20 mg p.o. q.d.  4. Lanoxin 0.2 mg  p.o. q.d.  5. Altace 5 mg p.o. q.d.  6. Toprol 100 mg p.o. q.d.  7. Coumadin 2.5 mg one tablet p.o. q.h.s. except for 1-1/2 tablets on     Tuesday and Friday.  SOCIAL HISTORY: The patient has been married for 51 years.  Tobacco, quit 35 years earlier.  EtOH, negative.  Caffeine, negative.  The patient has one son and one daughter alive and well, one in [place], [place] and one in [place].  FAMILY HISTORY: Mother died at age 50 of myocardial infarction, prior multiple episodes.  Father died at age 46 of complications of blood clot thrown after hip surgery.  One sister died of MI at age 49, multiple prior MIs.  She had an AICD/pacemaker.  REVIEW OF SYSTEMS: As in HPI/Past Medical History; otherwise, no problems with light headedness, dizziness, syncope, or near syncopal episodes.  No dysphagia with food or fluids.  Denies melena or bright red blood PR.  Has been having problems with constipation recently but last bowel movement yesterday (three or four times) after taking mag citrate the day before and on prior days milk of mag and stool softeners.  He has been counseled to take Metamucil by primary care doctors nurse.  The patient denies pedal edema, orthopnea, or PND.  PHYSICAL EXAMINATION (as performed by Dr. Verdis Prime):  VITAL SIGNS: Orthostatic vital signs lying 122/60 with heart rate 62, sitting 136/56 with heart rate 76, standing 135/56 with heart rate 72.  O2 saturation 99%.  Heart rate 54, respiratory rate 20.  GENERAL: He is a well-nourished, elderly gentleman in no apparent distress. His wife is in attendance.  HEENT/NECK: Bilateral carotid upstrokes without bruits, no significant JVD, no thyromegaly.  CARDIAC: Irregular rate and rhythm without murmur, rub, or gallop.  Crisp  metallic sounds, mitral valve.  ABDOMEN: Soft, nondistended.  Normoactive bowel sounds.  Negative abdomen aorta, renal, or femoral bruits.  Nontender to applied  pressure.  No masses. No organomegaly appreciated.  EXTREMITIES: Distal pulses intact.  Negative pedal edema.  NEUROLOGIC: Cranial nerves 2-12 grossly intact.  Alert and oriented x 3.  GU/RECTAL: Examinations deferred.  LABORATORY DATA: Pro time 49, INR 9, PTT 167.  Hemoglobin 12.9, hematocrit 38.2, WBC 7.7; platelets 256,000.  EKG revealed A fib with controlled ventricular response, 84 beats per minute; nonischemic EKG changes; occasional premature beats with right bundle branch block configuration.  Chest x-ray revealed no active disease. Dictated by:   Anselm Lis, N.P. Attending Physician:  Corlis Leak DD:  10/29/00 TD:  10/30/00 Job: [phone]

## 2010-07-25 NOTE — Cardiovascular Report (Signed)
[place]. [facility]  Patient:    Alfred Dennis, Alfred Dennis                       MRN: [id number] Proc. Date: 05/10/00 Adm. Date:  [id number] Disc. Date: [id number] Attending:  Jetty Duhamel T CC:         Jetty Duhamel, M.D.  Maurice Small, M.D.   Cardiac Catheterization  REFERRING PHYSICIAN:  Jetty Duhamel, M.D., Maurice Small, M.D.  PROCEDURES PERFORMED: 1. Left heart catheterization. 2. Left ventriculography.  SURGEON:  Cherlyn Labella, M.D.  INDICATIONS:  Class IV congestive heart failure and atrial fibrillation with severe mitral regurgitation.  BRIEF HISTORY:  This is a 77 year old white male previously healthy, not followed on a routine basis with his medical care, who presented with a dry cough for approximately five days and was diagnosed with bronchitis, but then a chest x-ray revealed congestive heart failure. He subsequently presented to the emergency room in Class IV congestive heart failure with rapid atrial fibrillation as well as systolic murmur consistent with mitral regurgitation. A 2D echocardiogram showed normal left ventricular size with mild left ventricular dysfunction and severe mitral regurgitation with marked enlarged left atrium. He now presents for cardiac catheterization.  COMPLICATIONS:  None.  DESCRIPTION OF PROCEDURE:  The patient was brought to the cardiac catheterization laboratory in a fasting, nonsedated state. Informed consent was obtained. The patient was connected to continuous heart rate and pulse pulse oximetry monitoring and intermittent blood pressure monitoring. The right groin was prepped and draped in a sterile fashion. Lidocaine 1% was used for local anesthesia. Using the modified Seldinger technique, a #6 French sheath was placed in the right femoral artery. Under fluoroscopic guidance a #6 [place] JL-4 catheter was placed over a guidewire into the left main coronary artery. Cine films were taken in the 30  degree RAO and 45 degree LAO cranial and caudal views. This catheter was then exchanged out over a guidewire for a #6 [place] JR-4 catheter which was placed over a guidewire into the right coronary artery under fluoroscopic guidance. Cineangiography was performed in the 30 degree and 40 degree cranial RAO and LAO positions respectively. This catheter was exchanged out over a guidewire for a #6 French straight pigtail catheter which was over the guidewire into the left ventricular cavity under fluoroscopic guidance. Left ventriculography was performed in the 30 degree straight RAO and 45 degree cranial LAO views. Thirty cc at 13 cc per second was used for the RAO view and 20 cc at 12 cc per second was used for the LAO view. The catheter was then pulled back across the aortic valve without any significant difference in pressure and then removed over a guidewire. At the end of the procedure all catheters and sheaths were removed. Manual compression was performed until adequate hemostasis was obtained. The patient tolerated the procedure well and was transferred back to his room in stable condition.  LEFT VENTRICULOGRAPHY: 1. Anterior apical and inferior apical akinetic segment with mild    left ventricular dysfunction and the ejection fraction estimated    at 40-45%. 2. There was severe 3-4 plus mitral regurgitation with a markedly    dilated left atrium that appeared actually to be the size of the    left ventricle. 3. The left ventricular end diastolic pressure was 20 mmHg,    left ventricular pressure 138/20 mmHg. 4. Aortic pressure 139/79 mmHg.  ANGIOGRAPHIC RESULTS: 1. Left main coronary artery: The left main coronary artery was  widely    patent and trifurcated into a left anterior descending artery, a    ramus branch and left circumflex branch. 2. Left anterior descending artery: The left anterior descending artery    had a 40-50% narrow eccentric lesion proximally and then gave  rise    to a small diagonal branch. There was diffuse irregularities    throughout the mid and distal left anterior descending artery.    There was a discrete 50-60% eccentric lesion seen best on the LAO    caudal view. The rest of the left anterior descending artery    traversed to the apex and was patent. There was a moderate sized    ramus branch that had an 80-90% in the proximal to the mid portion    of the vessel. 3. Left circumflex coronary artery: The left circumflex gave rise to    one very large obtuse marginal branch which was widely patent and    it bifurcated into two daughter vessels which were widely patent.    The mid circumflex artery traversed the AV groove and had a 70%    eccentric stenosis before bifurcating into the two daughter branches    which were patent. 4. Right coronary artery: The right coronary artery was widely patent    throughout its course and bifurcated into a posterior descending artery    and a posterolateral artery, both of which are widely patent.  IMPRESSION: 1. Two-vessel obstructive coronary artery disease. 2. Mild left ventricular dysfunction with apical akinesis. 3. Three to four plus mitral regurgitation with markedly dilated    left atrium. 4. Class IV congestive heart failure.  PLAN: 1. Continue IV heparin. 2. ACE inhibitors for afterload reduction. 3. Digoxin for atrial fibrillation rate control. 4. Continue diuresis. 5. Plan transesophageal echocardiogram tomorrow to further evaluate    etiology of mitral regurgitation and severity. The patient will    ultimately probably need to have coronary artery bypass grafting    and mitral valve replacement or repair. DD:  05/10/00 TD:  05/10/00 Job: [id number]

## 2010-07-25 NOTE — H&P (Signed)
[place]. [facility]  Patient:    Alfred Dennis, Alfred Dennis                       MRN: [id number] Adm. Date:  [id number] Attending:  Jetty Duhamel Dennis                         History and Physical  DATE OF BIRTH:  March 12, 1923.  PRIMARY PHYSICIAN:  Alfred Dennis, M.D.  CHIEF COMPLAINT:  New onset atrial fibrillation discovered at a visit with family practice physician.  HISTORY OF PRESENT ILLNESS:  Alfred Dennis is a 77 year old male who was sent to the emergency room at [facility] after evaluation by his primary care physician, Alfred Dennis. Valentina Dennis, who discovered the patient to be in atrial fibrillation with rapid ventricular response by electrocardiogram in her office.  The patient initially began to develop symptoms approximately four weeks ago consistent with an upper respiratory infection to include a dry cough and minimal shortness of breath only with exertion.  Symptoms persisted for approximately three weeks at which time the patient reported to Alfred Dennis for evaluation.  Initial diagnosis was possible bronchitis and the patient was placed on azithromycin as well as a cough medicine.  Chest x-ray obtained at that time later returned to Alfred Dennis with the reading of cardiomegaly with pulmonary edema.  As a result, the patient was placed on Lasix 10 mg q.d. over the phone and was asked to report to her office today.  During his office visit today, the patient complained of increasing shortness of breath over the last week, most prominent on exertion.  Electrocardiogram was done and the patient was found to be in atrial fibrillation with rapid ventricular response rate of approximately 150 beats per minute.  At this time, lying on a stretcher in the emergency room, the patient denies chest pain, palpitations, or numbness or tingling in the left arm.  He does state that when he physically exerts himself  even to the amount of just putting on pants or shirts that he gets markedly short of breath.  This resolves abruptly when resting.  He states that his cough has since cleared and that he never produced much sputum with the cough.  He has no fevers or chills.  Remarkably, Alfred Dennis has no significant past medical history and has been very healthy up until this time.  REVIEW OF SYSTEMS:  Review of systems is entirely negative with the exception of the positives noted in the history of present illness.  PAST MEDICAL HISTORY:  No previous hospitalizations.  No previous surgeries. Remote history of tobacco abuse in the amount of one pack per day x 10 years, abstinence x 35 years.  Unknown cholesterol status.  Questionable recent upper respiratory illness versus symptomatic onset of atrial fibrillation.  MEDICATIONS:  Chronically none.  Recently azithromycin in Z-Pak formulation with Lasix 10 mg q.d. x 3 days.  ALLERGIES:  None.  FAMILY HISTORY:  Noncontributory.  SOCIAL HISTORY:  The patient lives in [place].  He is retired.  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  Tobacco history is as above. He denies alcohol use or illicit drug use of any kind.  He is married and has two grown children who are healthy.  PHYSICAL EXAMINATION:  GENERAL:  A 77 year old Caucasian male who appears stated age, lying on a stretcher, somewhat short  of breath.  VITAL SIGNS:  Temperature 96.5, blood pressure 138/98, pulse 153, respiratory rate 32.  O2 saturation 100% on two liters.  HEENT:  Normocephalic and atraumatic.  Pupils are equal, round and reactive to light and accommodation.  Extraocular movements intact bilaterally.  Hearing intact grossly bilaterally.  OC/OP clear without exudate.  Nasal mucosa pink and moist without epistaxis or discharge.  NECK:  No lymphadenopathy or thyromegaly.  CARDIOVASCULAR:  Irregularly irregular with questionable 2/6 holosystolic murmur  with no appreciable gallops.  LUNGS:  Mild bibasilar crackles with good air movement throughout and no appreciable wheezing.  ABDOMEN:  Nontender, nondistended, soft.  Bowel sounds present.  No hepatosplenomegaly.  No rebound.  No ascites.  EXTREMITIES:  There is 2+ edema to the level of the knees bilateral lower extremities with no erythema or cutaneous abrasions.  NEUROLOGICAL:  There is 5/5 strength throughout upper and lower extremities. No Babinski.  Alert and oriented x 4.  Cranial nerves II-XII intact bilaterally.  Intact sensation and touch throughout.  GENITOURINARY:  Prostate mildly enlarged diffusely with no appreciable nodularity.  Hemoccult negative per doctors exam.  Scant brown stool.  LABORATORY DATA:  A 12-lead EKG revealing initial heart rate of 156 beats per minute with atrial fibrillation and subsequent follow-up with a 105 beats per minute, status post Cardizem bolus confirming atrial fibrillation.  Sodium 138, potassium 4.4, chloride 105, CO2 25, glucose 119, BUN 30, creatinine 1.2, calcium 9.1.  White blood cell count 11.9, hemoglobin 14.2, MCV 92.3, platelet count 172,000.  Absolute granulocyte count not available. CK 46, CK-MB 2.7, prothrombin time 15.3, INR 1.4, PTT 28, troponin I 0.01. TSH pending.  Chest x-ray revealing cardiomegaly with questionable pericardial effusion and mild pulmonary edema with slight blunting of right and left costophrenic angles.  IMPRESSION: 1. New onset of atrial fibrillation with rapid ventricular response:  We    will attempt rate control with IV Cardizem drip beginning at 5 mg    per hour and titrating up to a maximum of 15 as tolerated.  Keep heart    rate within target range and prevent hypotension.  We will begin evaluation    for the possible etiology of the atrial fibrillation with rule out    for myocardial infarction, though this is less likely in that the patient    has no symptoms consistent with angina  pectoris.  Additionally, TSH has    been drawn and we will await its results.  There is no history of pulmonary     disease to suggest this as a possible etiology.  The patient has not been    seen by a physician in some time and it is quite possible that he has    a primary congestive heart failure which could have lead to his atrial    fibrillation rather than the reverse but at this time we are unable to    make that call.  I will begin anticoagulation with IV heparin until    the patient is evaluated by cardiology to allow for the possibility of    acute interventions at their preference.  If no acute intervention is    deemed to be appropriate, I will change the patient to Coumadin for    long-term anticoagulation control.  At this time, I will not place the    patient on beta blocker as he is being initiated on a Cardizem drip and    I will hold on aspirin while he is heparinized. 2.  Congestive heart failure:  It is unclear at this time whether the patients    congestive heart failure was the initiating event which lead to his    atrial fibrillation or the reverse of that.  It seems most likely that    given that the patient was asymptomatic until approximately four weeks    ago that his congestive heart failure is probably the result of his    atrial fibrillation.  We will diurese at this time with IV Lasix and follow    Is and Os closely.  As his atrial fibrillation is rate control, I suspect    that his congestive heart failure would become controlled as well if it    is simply because of his atrial fibrillation.  We will obtain a 2-D    echocardiogram once the patient is rate controlled to assess for valvular    function as well as overall systolic and diastolic function to investigate    the possibility of a primary congestive heart failure.  Further medical    management will be geared upon the results of the 2-D echocardiogram.  The    patient will be supplemented with nasal  cannula oxygen while diuresis is    underway.  RECOMMENDATIONS:  I have consulted [facility] to evaluate the patient, to consider acute intervention for atrial fibrillation, and to help plan future follow-up if acute intervention is not felt to be appropriate. DD:  05/06/00 TD:  05/06/00 Job: [id number]

## 2010-07-25 NOTE — Cardiovascular Report (Signed)
[place]. [facility]  Patient:    Alfred Dennis, Alfred Dennis                       MRN: [id number] Proc. Date: 05/06/00 Adm. Date:  [id number] Disc. Date: [id number] Attending:  Kerin Perna Iii Dictator:   Sherrie George, P.A. CC:         Armanda Magic, M.D.  Jetty Duhamel, M.D.  Gretta Arab Valentina Lucks, M.D.  Mikey Bussing, M.D.   Cardiac Catheterization  DATE OF BIRTH:  1923-12-16  ADMISSION DIAGNOSES: 1. Poorly congestive heart failure with new onset of atrial fibrillation. 2. Questionable mitral regurgitation. 3. History of tobacco use.  DISCHARGE DIAGNOSES: 1. Severe two-vessel coronary artery disease, severe mitral regurgitation    +3-4 with class IV congestive heart failure and atrial fibrillation. 2. Postoperative cellulitis, right arm. 3. Postoperative anemia. 4. Postoperative atrial fibrillation/junctional rhythms.  PROCEDURES: 1. Left heart catheterization, May 10, 2000, with 70% mid RCA stenosis,    80-90% intermediate stenosis +3-4 MR with 40-45% ejection fraction. 2. Transesophageal echocardiography, May 11, 2000. 3. Right heart catheterization, May 13, 2000, Dr. Mayford Knife. 4. Coronary artery bypass grafting x 3 to the left internal mammary of    the LAD, saphenous vein graft to the second obtuse marginal, saphenous vein    graft to the ramus, mitral valve repair with #33 St. Jude mechanical    valve, May 17, 2000.  BRIEF HISTORY:  The patient is a 77 year old, white male, who was sent to the emergency room at [facility] after evaluation by his primary care physician, Dr. Maurice Small, revealed new onset atrial fibrillation and chest x-ray was consistent with congestive heart failure.  The patient initially presented with symptoms consistent with an upper respiratory infection and was treated with antibiotics, and chest x-ray was obtained and showed some pulmonary edema.  The patient was brought back for  reevaluation and chest x-ray showed worsening pulmonary edema with cardiomegaly.  The patient was subsequently admitted for medical management and further evaluation and treatment indicated.  PAST MEDICAL HISTORY:  Insignificant.  He has no previous hospitalizations.  REVIEW OF SYSTEMS:  Negative.  MEDICATIONS ON ADMISSION:  The patient was on a Z-Pak and was on Lasix 10 mg a day with three doses left.  ALLERGIES:  None.  HABITS:  The patient used tobacco for approximately 10 years and used alcohol for 35 years.  For further history and physical, please see the dictated noted.  HOSPITAL COURSE:  The patient was admitted and after admission was seen in consultation by Dr. Armanda Magic of the cardiology service.  It was her opinion that the patient had atrial fibrillation with rapid ventricular response and new congestive heart failure.  The patient was medically managed initially and showed good improvement on presentation and +2 edema of both lower extremities.  Once the patient was stable, he was taken to the catheterization lab by Dr. Mayford Knife and underwent left heart catheterization on May 10, 2000, which revealed a 70% mid RCA stenosis and 80-90% intermediate stenosis and +3 to +4 mitral regurgitation.  Ejection fraction was calculated at 40-45%.  At this point consideration for coronary artery bypass grafting and mitral valve repair was anticipated.  The patient underwent transesophageal echocardiogram on May 07, 2000.  This showed the left ventricle was mildly dilated.  Overall left ventricular systolic function was at normal limits of normal.  LV ejection fraction was estimated at 50-55% on this study.  Aortic valve thickness was mildly increased.  There appeared to be abnormal coaptation of the mitral valve leaflets.  The image was not clear but there appeared to be at least moderate prolapse in the posterior mitral valve leaflet with severe MR eccentrically directed  toward the intraatrial septum in the LA.  There was a moderate holosystolic mitral valve prolapse involving the posterior leaflet and mitral regurgitation grade was +3 to +4 on a scale of 0 to 4.  Mitral regurgitation jet was eccentric.  The left atrium was moderately to marked dilated.  The right ventricular size was in the upper limits of normal.  The estimated peak right ventricular systolic pressure was in the range of 45 to 55 mmHg.  The right atrium was dilated.  After completion of this study, Dr. Kathlee Nations Trigt was contacted.  He evaluated the patient.  It was his opinion the patient was a candidate for coronary artery bypass grafting along with mitral valve repair.  The risks and benefits were discussed in detail.  It was initially hoped he could make do with a mitral valve repair, although replacement was mentioned.  The patient remained stable and thus underwent right heart catheterization on May 13, 2000.  With this information at hand he was subsequently taken to the operating room by Dr. Donata Clay on May 17, 2000.  At that time he underwent procedures as described above which is coronary artery bypass grafting x 3 and a mitral valve replacement.  After exposing the mitral valve, he was felt to have severe prolapse and repair was not possible due to the extensive fiber elastic degeneration of the valve tissue.  Therefore, a mitral valve replacement was performed.  The patient tolerated the procedure well and returned to the intensive care unit in satisfactory condition.  He was initially A paced for a junctional rhythm but remained overall hemodynamically stable.  He was maintained on Lovenox and was started on Coumadin.  His Coumadin went up very rapidly and was ultimately held and allowed to drift back down and then  stabilized.  Because of his junctional rhythm, he continued to be A paced for several days.  There was concern that he might require a permanent  pacemaker. He continued to have intermittent bouts of junctional rhythm and atrial fibrillation.  He made slow steady progress.  He was maintained on daily diuretics.  Pacemaker was used to help with his cardiac output.  He also developed a cellulitis in the right arm which was treated conservatively with heat and Keflex as an antibiotic.  The patient was ultimately stabilized and transferred to the stepdown unit and then to 2000.  He made slow steady progress with no significant postoperative complications.  He had mild postoperative anemia and cellulitis in his right arm and then his intermittent atrial fibrillation and junctional rhythm problems which have pretty much resolved themselves.  At this point, he is ambulating up to 400 feet with O2 saturations in the 96 range on room air.  He is no longer having the junctional rhythm but remains in atrial fibrillation.  His INR is, at this point almost therapeutic with a pro time of 18.1 seconds and an INR of 1.8. The patient is hemodynamically stable.  Heart rate is in the 80s.  He has been followed on a daily basis by Dr. Mayford Knife, who today change Lopressor from 25 b.i.d. to Toprol XL 25 mg q.d.  If the patient continues to do well and has no further problems, and  INR is 2 or better, we anticipate discharge home in the a.m. of May 28, 2000.  DISCHARGE MEDICATIONS: 1. Lasix 40 mg p.o. q.d. 2. Potassium chloride 20 mEq q.d. 30 days of each. 3. Niferex 150 mg p.o. q.d. 4. Keflex 250 mg q.8h. x 5 days. 5. Altace 5 mg p.o. q.d. 6. Darvocet-N 100 1-2 p.o. q.4h. p.r.n. 7. Toprol XL 25 mg p.o. q.d. 8. Coumadin will probably send him home on 5 alternating with 2.5 mg    but will determine that in the a.m. just prior to discharge.  FOLLOWUP:  He is to see Dr. Mayford Knife next Monday on May 31, 2000, and have Coumadin checked.  He is to have a routine postoperative office examination by Dr. Mayford Knife in two weeks and will return to see Dr. Donata Clay  on Friday, April 12 at 10 a.m. with a chest x-ray from Dr. Malachy Mood office.  DISCHARGE ACTIVITY:  Light to moderate.  No lifting over 10 pounds.  No driving or strenuous activity.  WOUND CARE:  The patients wounds are all healing nicely.  He has a subcuticular suture in the sternum and legs.  There is no staple removal necessary.  The patient is to have his pacing wires removed today.  DISCHARGE LABORATORY DATA:  Sodium is 137, potassium is 3.7, chloride is 105, CO2 is 26, BUN is 15, creatinine is 0.9, glucose is 94.  Hemoglobin is 9.1 with hematocrit 27.3, white count of 9.5, platelets are 251,000.  Pro time is 18.1 with an INR of 1.8.  The patients INR was up to 4.0 on May 22, 2000. It has steadily come down from that point.  AST on March 16 is 32, ALT is 33, alk. phos. 57, total bilirubin 1.2.  RBC folate level was 531 with the normal being between 180 and 600.  Ferritin level was 721.  Vitamin B12 was 755 with the normal being 211 to 911.  Iron-binding capacity shows an iron of 28 with a normal of 42 to 135.  Total iron-binding capacity is 198 with the normal being 215 to 435, percent saturation was 14% with the normal being 20-55%. Reticulocyte count on May 21, 2000, was 1.8.  RBC reticulocyte was 2.84. Absolute reticulocyte was 51.1 with the normal being 19-186.  CONDITION ON DISCHARGE:  Improving. DD:  05/27/00 TD:  05/27/00 Job: [id number]

## 2010-07-25 NOTE — Consult Note (Signed)
[place]. [facility]  Patient:    Alfred, Dennis Visit Number: [id number] MRN: [id number]          Service Type: MED Location: [id number] Attending Physician:  Armanda Magic Dictated by:   Ollen Gross. Vernell Morgans, M.D. Proc. Date: 11/03/00 Adm. Date:  10/29/2000                            Consultation Report  HISTORY OF PRESENT ILLNESS:  [name] is a 77 year old gentleman who has a history of coronary artery disease who back in March 2002 underwent a CABG and mitral valve replacement with a St. Jude mechanical valve.  He also has a history of atrial fibrillation and has been on anticoagulation for both of these.  He was recently admitted with hematuria and was found to have an INR of 9.  As part of his workup for the hematuria, he underwent a CT scan of his abdomen and pelvis which showed some thickening of the gallbladder wall with what was thought to be stones and some stranding near the gallbladder.  The patient denies any fevers, chills, nausea, vomiting, chest pain, or abdominal pain.  His appetite has been good and he states were it not for the blood in his urine he would never have even thought about coming to the hospital.  The rest of his review of systems unremarkable.  PAST MEDICAL HISTORY:  Coronary artery disease, atrial fibrillation, embolic CVA, hypertension.  PAST SURGICAL HISTORY:  Mitral valve replacement and CABG.  MEDICATIONS: 1. Baby aspirin. 2. Zocor. 3. Lanoxin. 4. Altace. 5. Toprol. 6. Coumadin.  ALLERGIES:  He has no known drug allergies.  SOCIAL HISTORY:  Denies use of alcohol or tobacco products.  FAMILY HISTORY:  Noncontributory.  PHYSICAL EXAMINATION:  VITAL SIGNS:  He is afebrile and his vitals are stable.  GENERAL:  He is a well-developed, well-nourished, elderly white male in no acute distress lying comfortably in bed.  SKIN:  Warm and dry with no jaundice.  HEENT:  His extraocular muscles are  intact.  Pupils are equal, round, and react to light.  NECK:  No bruits.  LUNGS:  Clear to auscultation bilaterally.  HEART:  Irregularly irregular with an audible click.  ABDOMEN:  Completely soft and nontender with no guarding or peritoneal signs.  EXTREMITIES:  No cyanosis, clubbing, or edema.  NEUROLOGIC:  He is alert and oriented x 4.  HEMATOLOGIC:  I can palpate no lymphadenopathy.  LABORATORY DATA:  His white count is 6000.  ASSESSMENT AND PLAN:  This is a 77 year old white male with hematuria from his Coumadin anticoagulation who was incidentally found to have some thickening of the gallbladder wall on CT scan.  I would recommended checking some liver function tests on him at this point and, if his liver functions are completely normal, I would have a difficult time recommending that he have surgery, given the fact that he seems to be completely asymptomatic from his gallbladder. Certainly, if he ever were to develop some abdominal pain symptoms consistent with his gallbladder, we would consider offering him a cholecystectomy but this would require switching his anticoagulation from Coumadin to heparin and then stopping his anticoagulation for a period of time, which we would need the help of cardiology with.  We will check his liver functions today and follow closely with you.  Again, thank you for allowing [place] to help with the care of this patient. Dictated  by:   Ollen Gross Vernell Morgans, M.D. Attending Physician:  Armanda Magic DD:  11/03/00 TD:  11/03/00 Job: [id number]

## 2010-07-25 NOTE — Consult Note (Signed)
[place]. [facility]  Patient:    Alfred Dennis, Alfred Dennis Visit Number: [id number] MRN: [id number]          Service Type: MED Location: [id number] Attending Physician:  Armanda Magic Dictated by:   Lucrezia Starch. Ovidio Hanger, M.D. Adm. Date:  10/29/2000   CC:         Armanda Magic, M.D.   Consultation Report  CHIEF COMPLAINT:  "I have blood in my urine."  HISTORY OF PRESENT ILLNESS:  Mr. Cruey is a very nice, 77 year old white male who presents with significant cardiac problems, and he is status post coronary artery bypass and mitral valve replacement in March of 2002. He has had atrial fibrillation, congestive heart failure, and other arrhythmias. He was subsequently maintained on anticoagulation. On October 29, 2000, at 3 a.m., he developed spontaneous gross hematuria. He was seen in the emergency room where an INR was noted to be 9, and he was admitted for evaluation. He notes that he has really not had previous difficulty with urinating at home. He has never seen blood in his urine before; and since then, also his anticoagulation is under control and more appropriate, he has continued to have gross hematuria which is variable in intensity but persistent. There are no other urinary complaints.  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 He had severe mitral regurgitation, coronary artery bypass x 3, with mitral valve replacement with a St. Jude mechanical valve. He has left ventricular dysfunction, a dilated atrium, atrial fibrillation.  PAST SURGICAL HISTORY:  As above.  ALLERGIES:  No known allergies noted.  MEDICATIONS:  He takes baby aspirin, multivitamin, Zocor, Lanoxin, Altace, Toprol, Coumadin. He is currently maintained on Lovenox.  SOCIAL HISTORY:  Married 51 years. Quit smoking cigarettes 35 years ago. Negative alcohol. Caffeine negative.  FAMILY HISTORY:  His mother died at 28 of an MI, prior  multiple episodes. Father died at 20 of a blood clot. One sister died of an MI at age 55.  REVIEW OF SYSTEMS:  He does occasionally have shortness of breath. He has had no dizziness, syncope, or near-syncopal episodes. He has no dysphagia, although he does note some slight epigastric pain from time to time. He has been on a low-fat diet. Denies melena or bright red blood in stools. He has the gross hematuria as noted.  PHYSICAL EXAMINATION:  VITAL SIGNS:  Afebrile, vital signs stable.  GENERAL:  He is well-nourished, well-developed, elderly, in no acute distress. Oriented x 3.  HEENT/NECK:  No bruits. No jugular venous distention. No thyromegaly.  CHEST:  Normal diaphragmatic motion.  CARDIAC:  There is irregular irregular rhythm with a mitral valve click.  ABDOMEN:  Soft, nondistended, without masses or organomegaly. There are no costovertebral angle, inguinal, or suprapubic masses.  EXTREMITIES:  Essentially normal with negative edema.  NEUROLOGICAL:  Appears to be intact.  GENITOURINARY:  Penis and testicles without lesions.  PERTINENT LABORATORY DATA:  Urine culture and sensitivity is currently pending. BUN/creatinine is 14/1. H&H is 10.4/30.4.  A CT scan report was reviewed. He is noted to have gallstones with questionable inflammation in the perigallbladder area. There is no renal, ureter, or bladder abnormalities noted. There is BPH which is essentially normal for age.  IMPRESSION:  Gross hematuria, status post anticoagulation. CT scan is negative for significant urinary problems.  PLAN:  Flexible bedside cystourethroscopy. This was discussed in detail with the patient and with his wife and his  daughter on the telephone previously. They wish to proceed, and we will plan performing that Friday morning. He will proceed accordingly. Dictated by:   Lucrezia Starch. Ovidio Hanger, M.D. Attending Physician:  Armanda Magic DD:  11/03/00 TD:  11/03/00 Job: [id number]

## 2010-10-08 DIAGNOSIS — C44611 Basal cell carcinoma of skin of unspecified upper limb, including shoulder: Secondary | ICD-10-CM

## 2010-10-08 HISTORY — DX: Basal cell carcinoma of skin of unspecified upper limb, including shoulder: C44.611

## 2010-12-16 LAB — BASIC METABOLIC PANEL
Creatinine, Ser: 1.17
GFR calc Af Amer: >60
GFR calc non Af Amer: 60 — ABNORMAL LOW
Potassium: 4.8
Sodium: 139

## 2010-12-16 LAB — DIFFERENTIAL
Lymphocytes Relative: 13
Lymphs Abs: 1.5
Monocytes Absolute: 0.7
Monocytes Relative: 6
Neutrophils Relative %: 79 — ABNORMAL HIGH

## 2010-12-16 LAB — PROTIME-INR: INR: 2.1 — ABNORMAL HIGH

## 2010-12-16 LAB — CBC
HCT: 41.4
MCV: 91.1
RDW: 12.7

## 2011-03-10 DIAGNOSIS — H2510 Age-related nuclear cataract, unspecified eye: Secondary | ICD-10-CM | POA: Diagnosis not present

## 2011-03-16 DIAGNOSIS — H2510 Age-related nuclear cataract, unspecified eye: Secondary | ICD-10-CM | POA: Diagnosis not present

## 2011-03-23 DIAGNOSIS — Z954 Presence of other heart-valve replacement: Secondary | ICD-10-CM | POA: Diagnosis not present

## 2011-03-23 DIAGNOSIS — Z7901 Long term (current) use of anticoagulants: Secondary | ICD-10-CM | POA: Diagnosis not present

## 2011-03-30 DIAGNOSIS — H269 Unspecified cataract: Secondary | ICD-10-CM | POA: Diagnosis not present

## 2011-03-30 DIAGNOSIS — H2510 Age-related nuclear cataract, unspecified eye: Secondary | ICD-10-CM | POA: Diagnosis not present

## 2011-05-04 DIAGNOSIS — Z7901 Long term (current) use of anticoagulants: Secondary | ICD-10-CM | POA: Diagnosis not present

## 2011-05-04 DIAGNOSIS — Z954 Presence of other heart-valve replacement: Secondary | ICD-10-CM | POA: Diagnosis not present

## 2011-06-15 DIAGNOSIS — Z954 Presence of other heart-valve replacement: Secondary | ICD-10-CM | POA: Diagnosis not present

## 2011-06-15 DIAGNOSIS — Z7901 Long term (current) use of anticoagulants: Secondary | ICD-10-CM | POA: Diagnosis not present

## 2011-06-15 DIAGNOSIS — R609 Edema, unspecified: Secondary | ICD-10-CM | POA: Diagnosis not present

## 2011-06-15 DIAGNOSIS — Z79899 Other long term (current) drug therapy: Secondary | ICD-10-CM | POA: Diagnosis not present

## 2011-06-15 DIAGNOSIS — E7800 Pure hypercholesterolemia, unspecified: Secondary | ICD-10-CM | POA: Diagnosis not present

## 2011-06-15 DIAGNOSIS — I4891 Unspecified atrial fibrillation: Secondary | ICD-10-CM | POA: Diagnosis not present

## 2011-06-15 DIAGNOSIS — I2510 Atherosclerotic heart disease of native coronary artery without angina pectoris: Secondary | ICD-10-CM | POA: Diagnosis not present

## 2011-06-15 DIAGNOSIS — I10 Essential (primary) hypertension: Secondary | ICD-10-CM | POA: Diagnosis not present

## 2011-06-16 DIAGNOSIS — E7800 Pure hypercholesterolemia, unspecified: Secondary | ICD-10-CM | POA: Diagnosis not present

## 2011-06-16 DIAGNOSIS — Z79899 Other long term (current) drug therapy: Secondary | ICD-10-CM | POA: Diagnosis not present

## 2011-07-27 DIAGNOSIS — I4891 Unspecified atrial fibrillation: Secondary | ICD-10-CM | POA: Diagnosis not present

## 2011-07-27 DIAGNOSIS — Z7901 Long term (current) use of anticoagulants: Secondary | ICD-10-CM | POA: Diagnosis not present

## 2011-09-07 DIAGNOSIS — Z7901 Long term (current) use of anticoagulants: Secondary | ICD-10-CM | POA: Diagnosis not present

## 2011-09-07 DIAGNOSIS — Z954 Presence of other heart-valve replacement: Secondary | ICD-10-CM | POA: Diagnosis not present

## 2011-09-08 DIAGNOSIS — R7301 Impaired fasting glucose: Secondary | ICD-10-CM | POA: Diagnosis not present

## 2011-09-21 DIAGNOSIS — I4891 Unspecified atrial fibrillation: Secondary | ICD-10-CM | POA: Diagnosis not present

## 2011-09-21 DIAGNOSIS — Z954 Presence of other heart-valve replacement: Secondary | ICD-10-CM | POA: Diagnosis not present

## 2011-10-12 DIAGNOSIS — Z7901 Long term (current) use of anticoagulants: Secondary | ICD-10-CM | POA: Diagnosis not present

## 2011-10-12 DIAGNOSIS — Z954 Presence of other heart-valve replacement: Secondary | ICD-10-CM | POA: Diagnosis not present

## 2011-11-16 DIAGNOSIS — Z954 Presence of other heart-valve replacement: Secondary | ICD-10-CM | POA: Diagnosis not present

## 2011-11-16 DIAGNOSIS — Z7901 Long term (current) use of anticoagulants: Secondary | ICD-10-CM | POA: Diagnosis not present

## 2011-12-14 DIAGNOSIS — I4891 Unspecified atrial fibrillation: Secondary | ICD-10-CM | POA: Diagnosis not present

## 2011-12-14 DIAGNOSIS — I10 Essential (primary) hypertension: Secondary | ICD-10-CM | POA: Diagnosis not present

## 2011-12-14 DIAGNOSIS — Z954 Presence of other heart-valve replacement: Secondary | ICD-10-CM | POA: Diagnosis not present

## 2011-12-14 DIAGNOSIS — I2510 Atherosclerotic heart disease of native coronary artery without angina pectoris: Secondary | ICD-10-CM | POA: Diagnosis not present

## 2011-12-14 DIAGNOSIS — R609 Edema, unspecified: Secondary | ICD-10-CM | POA: Diagnosis not present

## 2011-12-14 DIAGNOSIS — E7800 Pure hypercholesterolemia, unspecified: Secondary | ICD-10-CM | POA: Diagnosis not present

## 2011-12-14 DIAGNOSIS — Z7901 Long term (current) use of anticoagulants: Secondary | ICD-10-CM | POA: Diagnosis not present

## 2011-12-14 DIAGNOSIS — Z79899 Other long term (current) drug therapy: Secondary | ICD-10-CM | POA: Diagnosis not present

## 2011-12-24 DIAGNOSIS — Z79899 Other long term (current) drug therapy: Secondary | ICD-10-CM | POA: Diagnosis not present

## 2011-12-24 DIAGNOSIS — I4891 Unspecified atrial fibrillation: Secondary | ICD-10-CM | POA: Diagnosis not present

## 2011-12-24 DIAGNOSIS — E7800 Pure hypercholesterolemia, unspecified: Secondary | ICD-10-CM | POA: Diagnosis not present

## 2011-12-24 DIAGNOSIS — R609 Edema, unspecified: Secondary | ICD-10-CM | POA: Diagnosis not present

## 2011-12-24 DIAGNOSIS — Z954 Presence of other heart-valve replacement: Secondary | ICD-10-CM | POA: Diagnosis not present

## 2011-12-24 DIAGNOSIS — I10 Essential (primary) hypertension: Secondary | ICD-10-CM | POA: Diagnosis not present

## 2011-12-24 DIAGNOSIS — I2510 Atherosclerotic heart disease of native coronary artery without angina pectoris: Secondary | ICD-10-CM | POA: Diagnosis not present

## 2011-12-30 DIAGNOSIS — R609 Edema, unspecified: Secondary | ICD-10-CM | POA: Diagnosis not present

## 2011-12-30 DIAGNOSIS — L03119 Cellulitis of unspecified part of limb: Secondary | ICD-10-CM | POA: Diagnosis not present

## 2011-12-30 DIAGNOSIS — Z9181 History of falling: Secondary | ICD-10-CM | POA: Diagnosis not present

## 2011-12-30 DIAGNOSIS — I10 Essential (primary) hypertension: Secondary | ICD-10-CM | POA: Diagnosis not present

## 2011-12-30 DIAGNOSIS — I4891 Unspecified atrial fibrillation: Secondary | ICD-10-CM | POA: Diagnosis not present

## 2011-12-30 DIAGNOSIS — Z1331 Encounter for screening for depression: Secondary | ICD-10-CM | POA: Diagnosis not present

## 2011-12-30 DIAGNOSIS — L02419 Cutaneous abscess of limb, unspecified: Secondary | ICD-10-CM | POA: Diagnosis not present

## 2012-01-06 DIAGNOSIS — L03119 Cellulitis of unspecified part of limb: Secondary | ICD-10-CM | POA: Diagnosis not present

## 2012-01-06 DIAGNOSIS — Z23 Encounter for immunization: Secondary | ICD-10-CM | POA: Diagnosis not present

## 2012-01-06 DIAGNOSIS — L02419 Cutaneous abscess of limb, unspecified: Secondary | ICD-10-CM | POA: Diagnosis not present

## 2012-01-11 DIAGNOSIS — I4891 Unspecified atrial fibrillation: Secondary | ICD-10-CM | POA: Diagnosis not present

## 2012-01-11 DIAGNOSIS — R609 Edema, unspecified: Secondary | ICD-10-CM | POA: Diagnosis not present

## 2012-01-11 DIAGNOSIS — I2510 Atherosclerotic heart disease of native coronary artery without angina pectoris: Secondary | ICD-10-CM | POA: Diagnosis not present

## 2012-01-11 DIAGNOSIS — Z79899 Other long term (current) drug therapy: Secondary | ICD-10-CM | POA: Diagnosis not present

## 2012-01-11 DIAGNOSIS — I10 Essential (primary) hypertension: Secondary | ICD-10-CM | POA: Diagnosis not present

## 2012-01-11 DIAGNOSIS — Z954 Presence of other heart-valve replacement: Secondary | ICD-10-CM | POA: Diagnosis not present

## 2012-01-11 DIAGNOSIS — Z7901 Long term (current) use of anticoagulants: Secondary | ICD-10-CM | POA: Diagnosis not present

## 2012-01-26 DIAGNOSIS — Z954 Presence of other heart-valve replacement: Secondary | ICD-10-CM | POA: Diagnosis not present

## 2012-01-26 DIAGNOSIS — Z7901 Long term (current) use of anticoagulants: Secondary | ICD-10-CM | POA: Diagnosis not present

## 2012-02-03 ENCOUNTER — Encounter (HOSPITAL_COMMUNITY): Payer: Self-pay | Admitting: Physical Medicine and Rehabilitation

## 2012-02-03 ENCOUNTER — Inpatient Hospital Stay (HOSPITAL_COMMUNITY)
Admission: EM | Admit: 2012-02-03 | Discharge: 2012-02-18 | DRG: 329 | Disposition: A | Discharge status: Home Health | Payer: Medicare Other | Attending: Family Medicine | Admitting: Family Medicine

## 2012-02-03 ENCOUNTER — Emergency Department (HOSPITAL_COMMUNITY): Payer: Medicare Other

## 2012-02-03 DIAGNOSIS — K8020 Calculus of gallbladder without cholecystitis without obstruction: Secondary | ICD-10-CM | POA: Diagnosis not present

## 2012-02-03 DIAGNOSIS — I482 Chronic atrial fibrillation, unspecified: Secondary | ICD-10-CM

## 2012-02-03 DIAGNOSIS — J9819 Other pulmonary collapse: Secondary | ICD-10-CM | POA: Diagnosis not present

## 2012-02-03 DIAGNOSIS — C184 Malignant neoplasm of transverse colon: Secondary | ICD-10-CM | POA: Diagnosis not present

## 2012-02-03 DIAGNOSIS — Z79899 Other long term (current) drug therapy: Secondary | ICD-10-CM

## 2012-02-03 DIAGNOSIS — R072 Precordial pain: Secondary | ICD-10-CM | POA: Diagnosis not present

## 2012-02-03 DIAGNOSIS — I872 Venous insufficiency (chronic) (peripheral): Secondary | ICD-10-CM | POA: Diagnosis present

## 2012-02-03 DIAGNOSIS — K2990 Gastroduodenitis, unspecified, without bleeding: Secondary | ICD-10-CM | POA: Diagnosis not present

## 2012-02-03 DIAGNOSIS — R7309 Other abnormal glucose: Secondary | ICD-10-CM | POA: Diagnosis present

## 2012-02-03 DIAGNOSIS — I4891 Unspecified atrial fibrillation: Secondary | ICD-10-CM | POA: Diagnosis present

## 2012-02-03 DIAGNOSIS — D649 Anemia, unspecified: Secondary | ICD-10-CM | POA: Diagnosis present

## 2012-02-03 DIAGNOSIS — I10 Essential (primary) hypertension: Secondary | ICD-10-CM | POA: Diagnosis not present

## 2012-02-03 DIAGNOSIS — I959 Hypotension, unspecified: Secondary | ICD-10-CM

## 2012-02-03 DIAGNOSIS — F17200 Nicotine dependence, unspecified, uncomplicated: Secondary | ICD-10-CM | POA: Diagnosis present

## 2012-02-03 DIAGNOSIS — G4700 Insomnia, unspecified: Secondary | ICD-10-CM | POA: Diagnosis not present

## 2012-02-03 DIAGNOSIS — R0989 Other specified symptoms and signs involving the circulatory and respiratory systems: Secondary | ICD-10-CM | POA: Diagnosis not present

## 2012-02-03 DIAGNOSIS — D509 Iron deficiency anemia, unspecified: Secondary | ICD-10-CM | POA: Diagnosis not present

## 2012-02-03 DIAGNOSIS — N179 Acute kidney failure, unspecified: Secondary | ICD-10-CM | POA: Diagnosis present

## 2012-02-03 DIAGNOSIS — I5023 Acute on chronic systolic (congestive) heart failure: Secondary | ICD-10-CM | POA: Diagnosis present

## 2012-02-03 DIAGNOSIS — K922 Gastrointestinal hemorrhage, unspecified: Secondary | ICD-10-CM | POA: Diagnosis not present

## 2012-02-03 DIAGNOSIS — I4729 Other ventricular tachycardia: Secondary | ICD-10-CM | POA: Diagnosis not present

## 2012-02-03 DIAGNOSIS — Z7901 Long term (current) use of anticoagulants: Secondary | ICD-10-CM

## 2012-02-03 DIAGNOSIS — D010 Carcinoma in situ of colon: Secondary | ICD-10-CM | POA: Diagnosis not present

## 2012-02-03 DIAGNOSIS — R0602 Shortness of breath: Secondary | ICD-10-CM | POA: Diagnosis not present

## 2012-02-03 DIAGNOSIS — K6389 Other specified diseases of intestine: Secondary | ICD-10-CM

## 2012-02-03 DIAGNOSIS — D490 Neoplasm of unspecified behavior of digestive system: Secondary | ICD-10-CM | POA: Diagnosis not present

## 2012-02-03 DIAGNOSIS — Z954 Presence of other heart-valve replacement: Secondary | ICD-10-CM | POA: Diagnosis not present

## 2012-02-03 DIAGNOSIS — I509 Heart failure, unspecified: Secondary | ICD-10-CM | POA: Diagnosis present

## 2012-02-03 DIAGNOSIS — C772 Secondary and unspecified malignant neoplasm of intra-abdominal lymph nodes: Secondary | ICD-10-CM | POA: Diagnosis not present

## 2012-02-03 DIAGNOSIS — K921 Melena: Secondary | ICD-10-CM | POA: Diagnosis present

## 2012-02-03 DIAGNOSIS — Z452 Encounter for adjustment and management of vascular access device: Secondary | ICD-10-CM | POA: Diagnosis not present

## 2012-02-03 DIAGNOSIS — D62 Acute posthemorrhagic anemia: Secondary | ICD-10-CM | POA: Diagnosis not present

## 2012-02-03 DIAGNOSIS — J984 Other disorders of lung: Secondary | ICD-10-CM | POA: Diagnosis not present

## 2012-02-03 DIAGNOSIS — K633 Ulcer of intestine: Secondary | ICD-10-CM | POA: Diagnosis not present

## 2012-02-03 DIAGNOSIS — D126 Benign neoplasm of colon, unspecified: Secondary | ICD-10-CM | POA: Diagnosis not present

## 2012-02-03 DIAGNOSIS — Z952 Presence of prosthetic heart valve: Secondary | ICD-10-CM

## 2012-02-03 DIAGNOSIS — C189 Malignant neoplasm of colon, unspecified: Secondary | ICD-10-CM | POA: Diagnosis not present

## 2012-02-03 DIAGNOSIS — J811 Chronic pulmonary edema: Secondary | ICD-10-CM | POA: Diagnosis not present

## 2012-02-03 DIAGNOSIS — I472 Ventricular tachycardia, unspecified: Secondary | ICD-10-CM | POA: Diagnosis not present

## 2012-02-03 DIAGNOSIS — K2970 Gastritis, unspecified, without bleeding: Secondary | ICD-10-CM | POA: Diagnosis present

## 2012-02-03 HISTORY — DX: Essential (primary) hypertension: I10

## 2012-02-03 HISTORY — DX: Unspecified osteoarthritis, unspecified site: M19.90

## 2012-02-03 LAB — CBC WITH DIFFERENTIAL/PLATELET
Basophils Absolute: 0.1 10*3/uL (ref 0.0–0.1)
Basophils Relative: 1 % (ref 0–1)
Eosinophils Relative: 1 % (ref 0–5)
HCT: 22.8 % — ABNORMAL LOW (ref 39.0–52.0)
Lymphocytes Relative: 21 % (ref 12–46)
Lymphs Abs: 1.6 10*3/uL (ref 0.7–4.0)
MCHC: 29.4 g/dL — ABNORMAL LOW (ref 30.0–36.0)
Monocytes Relative: 15 % — ABNORMAL HIGH (ref 3–12)
Platelets: 268 10*3/uL (ref 150–400)
RDW: 16.7 % — ABNORMAL HIGH (ref 11.5–15.5)
WBC: 7.8 10*3/uL (ref 4.0–10.5)

## 2012-02-03 LAB — PROTIME-INR: Prothrombin Time: 29.3 seconds — ABNORMAL HIGH (ref 11.6–15.2)

## 2012-02-03 LAB — POCT I-STAT, CHEM 8
Calcium, Ion: 1.21 mmol/L (ref 1.13–1.30)
Glucose, Bld: 110 mg/dL — ABNORMAL HIGH (ref 70–99)
Potassium: 4.7 mEq/L (ref 3.5–5.1)
Sodium: 138 mEq/L (ref 135–145)
TCO2: 25 mmol/L (ref 0–100)

## 2012-02-03 LAB — PREPARE RBC (CROSSMATCH)

## 2012-02-03 LAB — POCT I-STAT TROPONIN I

## 2012-02-03 LAB — ABO/RH: ABO/RH(D): A POS

## 2012-02-03 MED ORDER — NITROGLYCERIN 0.4 MG SL SUBL: 0.4000 mg | SUBLINGUAL_TABLET | SUBLINGUAL | Status: DC | PRN

## 2012-02-03 MED ORDER — BISACODYL 10 MG RE SUPP: 10.0000 mg | Freq: Every day | RECTAL | Status: DC | PRN

## 2012-02-03 MED ORDER — METOPROLOL SUCCINATE ER 25 MG PO TB24
25.0000 mg | ORAL_TABLET | Freq: Every day | ORAL | Status: DC
  Administered 2012-02-04: 25 mg via ORAL
  Filled 2012-02-03 (×3): qty 1

## 2012-02-03 MED ORDER — SIMVASTATIN 10 MG PO TABS
10.0000 mg | ORAL_TABLET | Freq: Every day | ORAL | Status: DC
  Administered 2012-02-03 – 2012-02-17 (×14): 10 mg via ORAL
  Filled 2012-02-03 (×16): qty 1

## 2012-02-03 MED ORDER — PANTOPRAZOLE SODIUM 40 MG IV SOLR
40.0000 mg | Freq: Every day | INTRAVENOUS | Status: DC
  Administered 2012-02-04 – 2012-02-05 (×2): 40 mg via INTRAVENOUS
  Filled 2012-02-03 (×3): qty 40

## 2012-02-03 MED ORDER — ONDANSETRON HCL 4 MG/2ML IJ SOLN: 4.0000 mg | Freq: Three times a day (TID) | INTRAMUSCULAR | Status: DC | PRN

## 2012-02-03 MED ORDER — ACETAMINOPHEN 325 MG PO TABS: 650.0000 mg | ORAL_TABLET | Freq: Four times a day (QID) | ORAL | Status: DC | PRN

## 2012-02-03 MED ORDER — SODIUM CHLORIDE 0.9 % IJ SOLN
3.0000 mL | INTRAMUSCULAR | Status: DC | PRN
  Administered 2012-02-05: 3 mL via INTRAVENOUS
  Administered 2012-02-13: 22:00:00 via INTRAVENOUS

## 2012-02-03 MED ORDER — ONDANSETRON HCL 4 MG PO TABS: 4.0000 mg | ORAL_TABLET | Freq: Four times a day (QID) | ORAL | Status: DC | PRN

## 2012-02-03 MED ORDER — SODIUM CHLORIDE 0.9 % IJ SOLN: 3.0000 mL | Freq: Two times a day (BID) | INTRAMUSCULAR | Status: DC

## 2012-02-03 MED ORDER — ACETAMINOPHEN 650 MG RE SUPP: 650.0000 mg | Freq: Four times a day (QID) | RECTAL | Status: DC | PRN

## 2012-02-03 MED ORDER — FUROSEMIDE 10 MG/ML IJ SOLN: 40.0000 mg | Freq: Every day | INTRAMUSCULAR | Status: DC

## 2012-02-03 MED ORDER — ONDANSETRON HCL 4 MG/2ML IJ SOLN: 4.0000 mg | Freq: Four times a day (QID) | INTRAMUSCULAR | Status: DC | PRN

## 2012-02-03 MED ORDER — FUROSEMIDE 10 MG/ML IJ SOLN
40.0000 mg | Freq: Two times a day (BID) | INTRAMUSCULAR | Status: DC
  Administered 2012-02-04 – 2012-02-07 (×7): 40 mg via INTRAVENOUS
  Filled 2012-02-03 (×9): qty 4

## 2012-02-03 MED ORDER — SODIUM CHLORIDE 0.9 % IV SOLN
250.0000 mL | INTRAVENOUS | Status: DC | PRN
  Administered 2012-02-04: 250 mL via INTRAVENOUS

## 2012-02-03 MED ORDER — SODIUM CHLORIDE 0.9 % IJ SOLN
3.0000 mL | Freq: Two times a day (BID) | INTRAMUSCULAR | Status: DC
  Administered 2012-02-04 – 2012-02-14 (×12): 3 mL via INTRAVENOUS

## 2012-02-03 NOTE — ED Notes (Signed)
Pt presents to department for evaluation of SOB and midsternal chest pressure. SOB has become progressively worse x2 week, states CP x1 month. Pt speaking complete sentences at the time. Respirations unlabored. He is alert and answering questions appropriately. Denies pain at the time.

## 2012-02-03 NOTE — ED Provider Notes (Signed)
History     CSN: [id number]  Arrival date & time 02/03/12  7829   First MD Initiated Contact with Patient 02/03/12 1032      Chief Complaint  Patient presents with  . Shortness of Breath     The history is provided by the patient.   Pt presents to department for evaluation of SOB and midsternal chest pressure. SOB has become progressively worse x2 week, states CP x1 month. Pt speaking complete sentences at the time. Respirations unlabored. He is alert and answering questions appropriately. Denies pain at the time.  No past medical history on file.  No past surgical history on file.  History reviewed. No pertinent family history.  History  Substance Use Topics  . Smoking status: Never Smoker   . Smokeless tobacco: Not on file  . Alcohol Use: No      Review of Systems All other systems reviewed and are negative Allergies  Review of patient's allergies indicates no known allergies.  Home Medications  No current outpatient prescriptions on file.  BP 131/48  Pulse 74  Temp 98.3 F (36.8 C) (Oral)  Resp 16  SpO2 99%  Physical Exam  Nursing note and vitals reviewed. Constitutional: He is oriented to person, place, and time. He appears well-developed and well-nourished. No distress.  HENT:  Head: Normocephalic and atraumatic.  Eyes: Pupils are equal, round, and reactive to light.  Neck: Normal range of motion. JVD (Mild JVD) present.  Cardiovascular: Normal rate and intact distal pulses.   Pulmonary/Chest: No respiratory distress. He has no rales.  Abdominal: Normal appearance. He exhibits no distension.  Genitourinary: Guaiac positive stool.  Musculoskeletal: Normal range of motion. He exhibits edema (4+ pitting edema bilaterally).  Neurological: He is alert and oriented to person, place, and time. No cranial nerve deficit.  Skin: Skin is warm and dry. No rash noted.  Psychiatric: He has a normal mood and affect. His behavior is normal.    ED Course    Procedures (including critical care time)  Labs Reviewed  PRO B NATRIURETIC PEPTIDE - Abnormal; Notable for the following:    Pro B Natriuretic peptide (BNP) 3809.0 (*)     All other components within normal limits  CBC WITH DIFFERENTIAL - Abnormal; Notable for the following:    RBC 3.18 (*)     Hemoglobin 6.7 (*)     HCT 22.8 (*)     MCV 71.7 (*)     MCH 21.1 (*)     MCHC 29.4 (*)     RDW 16.7 (*)     Monocytes Relative 15 (*)     Monocytes Absolute 1.2 (*)     All other components within normal limits  POCT I-STAT, CHEM 8 - Abnormal; Notable for the following:    BUN 34 (*)     Creatinine, Ser 1.40 (*)     Glucose, Bld 110 (*)     Hemoglobin 8.2 (*)     HCT 24.0 (*)     All other components within normal limits  POCT I-STAT TROPONIN I  OCCULT BLOOD, POC DEVICE  OCCULT BLOOD X 1 CARD TO LAB, STOOL  PROTIME-INR  APTT  PREPARE RBC (CROSSMATCH)  TYPE AND SCREEN   Dg Chest 2 View  02/03/2012  *RADIOLOGY REPORT*  Clinical Data: Short of breath.  Weakness.  CHEST - 2 VIEW  Comparison: None.  Findings: Cardiomegaly.  Pulmonary vascular congestion.  Elevation of the left hemidiaphragm.  Left basilar atelectasis.  No focal consolidation.  Mitral valve replacement.  Interstitial pulmonary edema is present.  IMPRESSION: Cardiomegaly, pulmonary vascular congestion and interstitial pulmonary edema compatible with mild CHF.   Original Report Authenticated By: Andreas Newport, M.D.      1. Anemia   2. GI bleed   3. Chronic congestive heart failure       MDM  Plan admission for transfusion and further evaluation of anemia        Nelia Shi, MD 02/03/12 1428

## 2012-02-03 NOTE — H&P (Addendum)
[facility] History and Physical  TRUETT MCFARLAN QMV:[id number] DOB: [birth year] DOA: 02/03/2012  Referring physician: Dr Radford Pax PCP: No primary provider on file.  Specialists:  Chief Complaint: worsening DOE  HPI: Alfred Dennis is a 78 y.o. Male with h/o afib, MVR st Jude's valve, CHF last EF 40-45% who presents with the above complaints. He states that for the past 3weeks he has had SOB with minimal exertion that has worsened. He is a poor historian and his daughter is assisting with the history(his wife is present but has some dementia and unable to assist) He denies orthopnea and states he wakes up multiple times at night but unable to tell if that happens because he feels SOB. He admits to leg swelling and states he has had it for sometime. He also admits that he stopped taking his lasix for sometime(unclear as to exactly how long)a few weeks ago when his wife was hospitalized but that he resumed it when he noticed he legs were swelling up more. He denies chest pian, cough, fevers. In the ED CXR showed Cardiomegaly, pulmonary vascular congestion and interstitial pulmonary edema compatible with mild CHF, BNP elevated and labs showed hgb of 6.7 but recheck on a few mins later was 8.2.Last Hgb was 13.9 in 2008   H e admits to easy fatiguability. Rectal exam per EDP was guaiac +, brown stool, INR IS 2.96. He denies melena, and no hematochezia. He is admitted  For further eval and management.      Review of Systems: The patient denies anorexia, fever, weight loss,, vision loss, decreased hearing, hoarseness, chest pain, syncope, balance deficits, hemoptysis, abdominal pain, melena, hematochezia, severe indigestion/heartburn, hematuria, incontinence,transient blindness, depression  Past Medical History  Diagnosis Date  . Hypertension   . Shortness of breath     "w/any activity lately" (02/03/2012)  . CHF (congestive heart failure)     mild/note 02/03/2012  . Arthritis     "left  foot; fingers" (02/03/2012)   Past Surgical History  Procedure Date  . Inguinal hernia repair     right  . Cardiac valve replacement ~ 2001    "Delio't know which one"  (1127/2013)  . Cataract extraction w/ intraocular lens  implant, bilateral ~ 2011  . Valve replacement     Dr. Mayford Knife Cardiology   Social History:  reports that he has been smoking Cigarettes.  He has a 20 pack-year smoking history. He has never used smokeless tobacco. He reports that he does not drink alcohol or use illicit drugs. where does patient live--home   No Known Allergies family history. Mother died at age 20 of myocardial infarction,  Father died at age 44 of complications of blood clot thrown after  hip surgery. One sister died of MI at age 57, multiple prior MIs.    Prior to Admission medications   Medication Sig Start Date End Date Taking? Authorizing Provider  furosemide (LASIX) 20 MG tablet Take 40 mg by mouth daily.   Yes Historical Provider, MD  hydrALAZINE (APRESOLINE) 25 MG tablet Take 37.5 mg by mouth 4 (four) times daily.   Yes Historical Provider, MD  metoprolol succinate (TOPROL-XL) 50 MG 24 hr tablet Take 75 mg by mouth daily. Take with or immediately following a meal.   Yes Historical Provider, MD  Multiple Vitamin (MULTIVITAMIN WITH MINERALS) TABS Take 1 tablet by mouth daily.   Yes Historical Provider, MD  omega-3 acid ethyl esters (LOVAZA) 1 G capsule Take 1 g by mouth 3 (three) times  daily.   Yes Historical Provider, MD  simvastatin (ZOCOR) 10 MG tablet Take 10 mg by mouth at bedtime.   Yes Historical Provider, MD  triamcinolone cream (KENALOG) 0.1 % Apply 1 application topically 2 (two) times daily. Stop on 02/05/12   Yes Historical Provider, MD  warfarin (COUMADIN) 5 MG tablet Take 2.5-5 mg by mouth daily. Takes 2.5mg  everyday except 5mg  on Tuesday, Thursday, and Sunday   Yes Historical Provider, MD   Physical Exam: Filed Vitals:   02/03/12 1005 02/03/12 1115 02/03/12 1202 02/03/12 1400    BP: 147/81 133/54 120/58 131/48  Pulse: 89 82 76 74  Temp: 98.3 F (36.8 C)     TempSrc: Oral     Resp: 24 20 16 16   SpO2: 100% 98% 99% 99%   Constitutional: Vital signs reviewed.  Patient is a well-developed and well-nourished  in no acute distress and cooperative with exam. Alert and oriented x3.  Head: Normocephalic and atraumatic Ear: TM normal bilaterally Mouth: no erythema or exudates, MMM Eyes: PERRL, EOMI, conjunctivae normal, No scleral icterus.  Neck: Supple, Trachea midline normal ROM, No JVD, mass, thyromegaly, or carotid bruit present.  Cardiovascular: Irreg, irreg, rate controlled, S1 normal, S2 normal, no S3 appreciated, pulses symmetric and intact bilaterally Pulmonary/Chest: CTAB, no wheezes,few crackles at bases Abdominal: Soft. Non-tender, non-distended, bowel sounds are normal, no masses, organomegaly, or guarding present.  Extremities:+2-3 edema, no cyanosis Neurological: A&O x3, Strength is normal and symmetric bilaterally, cranial nerve II-XII are grossly intact, no focal motor deficit, sensory intact to light touch bilaterally.  Skin: Warm, dry and intact. No rash. Psychiatric: Normal mood and affect.  Labs on Admission:  Basic Metabolic Panel:  Lab 02/03/12 6283  NA 138  K 4.7  CL 103  CO2 --  GLUCOSE 110*  BUN 34*  CREATININE 1.40*  CALCIUM --  MG --  PHOS --   Liver Function Tests: No results found for this basename: AST:5,ALT:5,ALKPHOS:5,BILITOT:5,PROT:5,ALBUMIN:5 in the last 168 hours No results found for this basename: LIPASE:5,AMYLASE:5 in the last 168 hours No results found for this basename: AMMONIA:5 in the last 168 hours CBC:  Lab 02/03/12 1126 02/03/12 1113  WBC -- 7.8  NEUTROABS -- 4.8  HGB 8.2* 6.7*  HCT 24.0* 22.8*  MCV -- 71.7*  PLT -- 268   Cardiac Enzymes: No results found for this basename: CKTOTAL:5,CKMB:5,CKMBINDEX:5,TROPONINI:5 in the last 168 hours  BNP (last 3 results)  Basename 02/03/12 1113  PROBNP 3809.0*    CBG: No results found for this basename: GLUCAP:5 in the last 168 hours  Radiological Exams on Admission: Dg Chest 2 View  02/03/2012  *RADIOLOGY REPORT*  Clinical Data: Short of breath.  Weakness.  CHEST - 2 VIEW  Comparison: None.  Findings: Cardiomegaly.  Pulmonary vascular congestion.  Elevation of the left hemidiaphragm.  Left basilar atelectasis.  No focal consolidation.  Mitral valve replacement.  Interstitial pulmonary edema is present.  IMPRESSION: Cardiomegaly, pulmonary vascular congestion and interstitial pulmonary edema compatible with mild CHF.   Original Report Authenticated By: Andreas Newport, M.D.       Assessment/Plan Principal Problem:  *CHF (congestive heart failure) - start diureses with iv lasix - echo, CES to further eval, anemia and  non compliance with lasix also contributing factors - he is followed by Dr Royston Cowper. Active Problems: Anemia/GIB - recheck hgb abd transfuse as appropriate - GI consulted- Kiron to see - holDING coumadin for now  Chronic a-fib  ARF (acute renal failure) - monitor with diuresis, and further  manage accordingly if worsening  HTN (hypertension) - toprol resume at lower dose given GIB, and holding off hydralazine for now monitor and further treat as appropriate  S/P MVR (mitral valve replacement) -holding coumadin, follow and start heparin when INR<2.5 as recommended per cards.  -Cards consulted for further recs - Dr Eldridge Dace to see   Code Status: full Family Communication: daughter and wife at bedside Disposition Plan: admit to tele.  Time spent: >38mins  Kela Millin [facility] Pager [phone]  If 7PM-7AM, please contact night-coverage [email or web address] Password [facility] 02/03/2012, 4:38 PM

## 2012-02-03 NOTE — Consult Note (Addendum)
Admit date: 02/03/2012 Referring Physician  Dr. Donna Bernard Primary Physician  Maurice Small, MD Primary Cardiologist  Dr. Mayford Knife Reason for Consultation  anemia  HPI: 78 year old man with history of mitral valve replacement who has been experiencing shortness of breath.  He has been treated for lower extremity edema over the past few weeks with diuretics.  In the past, he also had three-vessel bypass surgery with Dr. Donata Clay.  He does not report significant orthopnea.  He does not report any bright red blood in his stool.  He has not had black stool.  He does report some blood on the toilet paper after wiping.  He thought this may have been related to issues with urinary incontinence.  Currently, he denies any chest pain or shortness of breath at rest.  In the emergency room, he was found to be guaiac positive.  Because of this, we are asked to help with anticoagulation.  He will be seen by GI shortly as well.     PMH:   Past Medical History  Diagnosis Date  . Hypertension   . Shortness of breath     "w/any activity lately" (02/03/2012)  . CHF (congestive heart failure)     mild/note 02/03/2012  . Arthritis     "left foot; fingers" (02/03/2012)     PSH:   Past Surgical History  Procedure Date  . Inguinal hernia repair     right  . Cardiac valve replacement ~ 2001    "Ichiro't know which one"  (1127/2013)  . Cataract extraction w/ intraocular lens  implant, bilateral ~ 2011  . Valve replacement     Dr. Mayford Knife Cardiology    Allergies:  Review of patient's allergies indicates no known allergies. Prior to Admit Meds:   Prescriptions prior to admission  Medication Sig Dispense Refill  . furosemide (LASIX) 20 MG tablet Take 40 mg by mouth daily.      . hydrALAZINE (APRESOLINE) 25 MG tablet Take 37.5 mg by mouth 4 (four) times daily.      . metoprolol succinate (TOPROL-XL) 50 MG 24 hr tablet Take 75 mg by mouth daily. Take with or immediately following a meal.      . Multiple Vitamin  (MULTIVITAMIN WITH MINERALS) TABS Take 1 tablet by mouth daily.      Marland Kitchen omega-3 acid ethyl esters (LOVAZA) 1 G capsule Take 1 g by mouth 3 (three) times daily.      . simvastatin (ZOCOR) 10 MG tablet Take 10 mg by mouth at bedtime.      . triamcinolone cream (KENALOG) 0.1 % Apply 1 application topically 2 (two) times daily. Stop on 02/05/12      . warfarin (COUMADIN) 5 MG tablet Take 2.5-5 mg by mouth daily. Takes 2.5mg  everyday except 5mg  on Tuesday, Thursday, and Sunday       Fam HX:   History reviewed. No pertinent family history. Social HX:    History   Social History  . Marital Status: Married    Spouse Name: N/A    Number of Children: N/A  . Years of Education: N/A   Occupational History  . Not on file.   Social History Main Topics  . Smoking status: Current Every Day Smoker -- 1.0 packs/day for 20 years    Types: Cigarettes  . Smokeless tobacco: Never Used     Comment: 02/03/2012 "quit smoking in my 30's"  . Alcohol Use: No  . Drug Use: No  . Sexually Active: No   Other Topics  Concern  . Not on file   Social History Narrative  . No narrative on file     ROS:  All 11 ROS were addressed and are negative except what is stated in the HPI  Physical Exam: Blood pressure 126/55, pulse 83, temperature 98.9 F (37.2 C), temperature source Oral, resp. rate 17, SpO2 99.00%.   General: Well developed, well nourished, in no acute distress Head:   Normal cephalic and atramatic  Lungs:  Scant bibasilar crackles Heart:  Irregularly irregular rhythm, crisp S1 click, S2 Abdomen:  abdomen soft and non-tender Msk:   Normal strength and tone for age. Extremities:  3+ bilateral lower extremity pitting edema.  Neuro: Alert Psych:  Normal affect, responds appropriately    Labs:   Lab Results  Component Value Date   WBC 7.8 02/03/2012   HGB 8.2* 02/03/2012   HCT 24.0* 02/03/2012   MCV 71.7* 02/03/2012   PLT 268 02/03/2012    Lab 02/03/12 1126  NA 138  K 4.7  CL 103    CO2 --  BUN 34*  CREATININE 1.40*  CALCIUM --  PROT --  BILITOT --  ALKPHOS --  ALT --  AST --  GLUCOSE 110*   No results found for this basename: PTT   Lab Results  Component Value Date   INR 2.96* 02/03/2012   INR 2.1* 01/30/2007   No results found for this basename: CKTOTAL, CKMB, CKMBINDEX, TROPONINI     No results found for this basename: CHOL   No results found for this basename: HDL   No results found for this basename: LDLCALC   No results found for this basename: TRIG   No results found for this basename: CHOLHDL   No results found for this basename: LDLDIRECT      Radiology:  Dg Chest 2 View  02/03/2012  *RADIOLOGY REPORT*  Clinical Data: Short of breath.  Weakness.  CHEST - 2 VIEW  Comparison: None.  Findings: Cardiomegaly.  Pulmonary vascular congestion.  Elevation of the left hemidiaphragm.  Left basilar atelectasis.  No focal consolidation.  Mitral valve replacement.  Interstitial pulmonary edema is present.  IMPRESSION: Cardiomegaly, pulmonary vascular congestion and interstitial pulmonary edema compatible with mild CHF.   Original Report Authenticated By: Andreas Newport, M.D.     EKG:  Atrial fibrillation with PVC  ASSESSMENT: Mitral valve replacement, anemia, heart failure, possible GI bleed  PLAN:  May need GI workup.  If invasive procedure is needed, would hold Coumadin.  Start IV heparin when INR below 2.5.  This can be stopped for whatever procedure is needed.  Given that his prosthetic valves in the mitral position, he is at higher risk for thrombosis of the valve if he is off of anticoagulation for a prolonged period of time.  Would recommend IV Lasix, 40 mg IV twice a day.  He has evidence of significant fluid overload.  Follow kidney function and potassium closely. EF in the past was 40% in 2008.  Likely acute on chronic systolic heart failure.  CAD.  No angina.  Will follow along.  Corky Crafts., MD  02/03/2012  5:36 PM

## 2012-02-03 NOTE — ED Notes (Signed)
Pt transported to xray 

## 2012-02-04 ENCOUNTER — Encounter (HOSPITAL_COMMUNITY): Payer: Self-pay | Admitting: *Deleted

## 2012-02-04 DIAGNOSIS — I4891 Unspecified atrial fibrillation: Secondary | ICD-10-CM

## 2012-02-04 DIAGNOSIS — D649 Anemia, unspecified: Secondary | ICD-10-CM

## 2012-02-04 DIAGNOSIS — I509 Heart failure, unspecified: Secondary | ICD-10-CM

## 2012-02-04 DIAGNOSIS — K922 Gastrointestinal hemorrhage, unspecified: Secondary | ICD-10-CM

## 2012-02-04 LAB — HEMOGLOBIN AND HEMATOCRIT, BLOOD: Hemoglobin: 9.3 g/dL — ABNORMAL LOW (ref 13.0–17.0)

## 2012-02-04 LAB — TROPONIN I: Troponin I: <0.3 ng/mL (ref <0.30)

## 2012-02-04 LAB — PROTIME-INR
Prothrombin Time: 26.3 seconds — ABNORMAL HIGH (ref 11.6–15.2)
Prothrombin Time: 26.3 seconds — ABNORMAL HIGH (ref 11.6–15.2)

## 2012-02-04 LAB — CBC
MCH: 23.2 pg — ABNORMAL LOW (ref 26.0–34.0)
MCHC: 31.5 g/dL (ref 30.0–36.0)
Platelets: 161 10*3/uL (ref 150–400)
RBC: 3.53 MIL/uL — ABNORMAL LOW (ref 4.22–5.81)
RDW: 17.8 % — ABNORMAL HIGH (ref 11.5–15.5)

## 2012-02-04 LAB — IRON AND TIBC
Iron: 22 ug/dL — ABNORMAL LOW (ref 42–135)
Saturation Ratios: 5 % — ABNORMAL LOW (ref 20–55)
TIBC: 423 ug/dL (ref 215–435)
UIBC: 401 ug/dL — ABNORMAL HIGH (ref 125–400)

## 2012-02-04 LAB — BASIC METABOLIC PANEL
GFR calc Af Amer: 63 mL/min — ABNORMAL LOW (ref >90)
GFR calc non Af Amer: 54 mL/min — ABNORMAL LOW (ref >90)
Glucose, Bld: 92 mg/dL (ref 70–99)
Potassium: 4.3 mEq/L (ref 3.5–5.1)
Sodium: 140 mEq/L (ref 135–145)

## 2012-02-04 MED ORDER — HEPARIN (PORCINE) IN NACL 100-0.45 UNIT/ML-% IJ SOLN
750.0000 [IU]/h | INTRAMUSCULAR | Status: AC
  Administered 2012-02-04 – 2012-02-05 (×2): 1050 [IU]/h via INTRAVENOUS
  Administered 2012-02-06: 900 [IU]/h via INTRAVENOUS
  Administered 2012-02-07: 750 [IU]/h via INTRAVENOUS
  Filled 2012-02-04 (×5): qty 250

## 2012-02-04 NOTE — Progress Notes (Signed)
[facility] PROGRESS NOTE  HAFIZ IRION ZDG:[id number] DOB: 01-19-1924 DOA: 02/03/2012 PCP: No primary provider on file.  Assessment/Plan: Acute on chronic systolic CHF -Last ejection fraction 40% -Recheck echocardiogram -Continue furosemide IV 40 mg twice a day -Appreciate cardiology recommendations -Continue Toprol-XL -Daily weights, strict I.'s and O.'s -neg 575cc for admission -Check TSH Anemia with positive FOBT -Transfused 2 units PRBCs yesterday -GI has been consulted -Hold warfarin -With high risk of thrombosis due to St. Jude MVR, start heparin if INR is less than 2.5 Chronic atrial fibrillation -Continue metoprolol succinate -Rate controlled currently -Anticoagulation as discussed above Microcytic anemia -Await GI eval -Check iron studies although may be falsely normal given recent transfusion Venous stasis dermatitis-legs -No need for antibiotics for his legs    Family Communication:   Daughter at beside Disposition Plan:   Home when medically stable     Procedures/Studies: Dg Chest 2 View  02/03/2012  *RADIOLOGY REPORT*  Clinical Data: Short of breath.  Weakness.  CHEST - 2 VIEW  Comparison: None.  Findings: Cardiomegaly.  Pulmonary vascular congestion.  Elevation of the left hemidiaphragm.  Left basilar atelectasis.  No focal consolidation.  Mitral valve replacement.  Interstitial pulmonary edema is present.  IMPRESSION: Cardiomegaly, pulmonary vascular congestion and interstitial pulmonary edema compatible with mild CHF.   Original Report Authenticated By: Andreas Newport, M.D.          Subjective: Patient continues to complain of orthopnea and dyspnea on exertion. No dyspnea at rest. No distress. Denies any fevers, chills, chest pain, nausea, vomiting, diarrhea, abdominal pain, dizziness.   Objective: Filed Vitals:   02/04/12 0216 02/04/12 0316 02/04/12 0415 02/04/12 0534  BP: 155/50 123/50 136/58 137/60  Pulse: 80 104 82 72  Temp:  98.5 F (36.9 C) 98 F (36.7 C) 97.8 F (36.6 C) 98.4 F (36.9 C)  TempSrc: Oral Oral Oral Oral  Resp: 20 18 20 18   Weight:    75.2 kg (165 lb 12.6 oz)  SpO2:    96%    Intake/Output Summary (Last 24 hours) at 02/04/12 0929 Last data filed at 02/04/12 9518  Gross per 24 hour  Intake    950 ml  Output   1525 ml  Net   -575 ml   Weight change:  Exam:   General:  Pt is alert, follows commands appropriately, not in acute distress  HEENT: No icterus, No thrush, No neck mass, [place]/AT  Cardiovascular: IRRR,no rubs, no gallops  Respiratory: Bibasilar crackles, right greater than left. No wheezes or rhonchi.   Abdomen: Soft/+BS, non tender, non distended, no guarding  Extremities: 3+ edema, No lymphangitis, No petechiae, No rashes, no synovitis  Data Reviewed: Basic Metabolic Panel:  Lab 02/04/12 8416 02/03/12 1126  NA 140 138  K 4.3 4.7  CL 108 103  CO2 25 --  GLUCOSE 92 110*  BUN 20 34*  CREATININE 1.18 1.40*  CALCIUM 9.1 --  MG -- --  PHOS -- --   Liver Function Tests: No results found for this basename: AST:5,ALT:5,ALKPHOS:5,BILITOT:5,PROT:5,ALBUMIN:5 in the last 168 hours No results found for this basename: LIPASE:5,AMYLASE:5 in the last 168 hours No results found for this basename: AMMONIA:5 in the last 168 hours CBC:  Lab 02/04/12 0455 02/03/12 1126 02/03/12 1113  WBC 5.7 -- 7.8  NEUTROABS -- -- 4.8  HGB 8.2* 8.2* 6.7*  HCT 26.0* 24.0* 22.8*  MCV 73.7* -- 71.7*  PLT 161 -- 268   Cardiac Enzymes:  Lab 02/04/12 0445 02/03/12 2158  CKTOTAL -- --  CKMB -- --  CKMBINDEX -- --  TROPONINI <0.30 <0.30   BNP: No components found with this basename: POCBNP:5 CBG: No results found for this basename: GLUCAP:5 in the last 168 hours  No results found for this or any previous visit (from the past 240 hour(s)).   Scheduled Meds:   . furosemide  40 mg Intravenous BID  . metoprolol succinate  25 mg Oral Daily  . pantoprazole (PROTONIX) IV  40 mg Intravenous  Daily  . simvastatin  10 mg Oral q1800  . sodium chloride  3 mL Intravenous Q12H  . sodium chloride  3 mL Intravenous Q12H  . [DISCONTINUED] furosemide  40 mg Intravenous Daily   Continuous Infusions:    Akeya Ryther, DO  [facility] Pager [phone]  If 7PM-7AM, please contact night-coverage [email or web address] Password [email or web address] 02/04/2012, 9:29 AM   LOS: 1 day

## 2012-02-04 NOTE — Progress Notes (Signed)
Subjective:  Sitting up in bed eating soup. No distress. Daughter here as well as wife.   Objective:  Vital Signs in the last 24 hours: Temp:  [97.1 F (36.2 C)-98.9 F (37.2 C)] 98.4 F (36.9 C) (11/28 0534) Pulse Rate:  [58-104] 72  (11/28 0534) Resp:  [16-24] 18  (11/28 0534) BP: (119-155)/(48-81) 137/60 mmHg (11/28 0534) SpO2:  [96 %-100 %] 96 % (11/28 0534) Weight:  [75.2 kg (165 lb 12.6 oz)] 75.2 kg (165 lb 12.6 oz) (11/28 0534)  Intake/Output from previous day: 11/27 0701 - 11/28 0700 In: 950 [P.O.:300; Blood:650] Out: 1325 [Urine:1325]   Physical Exam: General: Well developed, well nourished, in no acute distress. Head:  Normocephalic and atraumatic. Lungs:Mild decrease at bases. No wheeze Heart: Irreg Irreg, S1 click.   No murmur, rubs or gallops.  Abdomen: soft, non-tender, positive bowel sounds. Extremities: No clubbing or cyanosis.3+ chronic appearing LE edema. Neurologic: Alert and oriented x 3.    Lab Results:  Basename 02/04/12 0455 02/03/12 1126 02/03/12 1113  WBC 5.7 -- 7.8  HGB 8.2* 8.2* --  PLT 161 -- 268    Basename 02/04/12 0455 02/03/12 1126  NA 140 138  K 4.3 4.7  CL 108 103  CO2 25 --  GLUCOSE 92 110*  BUN 20 34*  CREATININE 1.18 1.40*    Basename 02/04/12 0445 02/03/12 2158  TROPONINI <0.30 <0.30    Imaging: Dg Chest 2 View  02/03/2012  *RADIOLOGY REPORT*  Clinical Data: Short of breath.  Weakness.  CHEST - 2 VIEW  Comparison: None.  Findings: Cardiomegaly.  Pulmonary vascular congestion.  Elevation of the left hemidiaphragm.  Left basilar atelectasis.  No focal consolidation.  Mitral valve replacement.  Interstitial pulmonary edema is present.  IMPRESSION: Cardiomegaly, pulmonary vascular congestion and interstitial pulmonary edema compatible with mild CHF.   Original Report Authenticated By: Andreas Newport, M.D.    Personally viewed.   Telemetry: AFIB persistent. 130 at ambulation. 5 beats of WCT possible afib with aberr.   Personally viewed.   Assessment/Plan:  Principal Problem:  *CHF (congestive heart failure) Active Problems:  Chronic a-fib  ARF (acute renal failure)  HTN (hypertension)  S/P MVR (mitral valve replacement)  Anemia  GIB (gastrointestinal bleeding)   -INR 2.6. Start heparin IV once below 2.5. Increased risk of thrombosis. MECHANICAL valve.   - AFIB reasonable control. Cont with Toprol 25 QD.   - Diastolic/Sysolic heart failure - no significant orthopnea or SOB but does have edema. Continue with IV lasix 40 BID. Replete K as needed.   -Anemia - Hg 6.7 now 8.2 post tx. GIB - GI to see.   Will follow with you. Spoke at length with daughter.   SKAINS, MARK 02/04/2012, 9:54 AM

## 2012-02-04 NOTE — Consult Note (Signed)
Referring Provider: [facility] Primary Care Physician:  No primary provider on file. Primary Gastroenterologist:  none  Reason for Consultation:  anemia  HPI: Alfred Dennis is a 78 y.o. male with history of congestive heart failure and chronic atrial fibrillation. He is status post mitral valve replacement in 2001 with St. Jude's valve and has been on chronic Coumadin since. Patient was admitted yesterday with complaint of progressive dyspnea which she says started about 4-5 weeks ago and had gradually become worse . Apparently his wife was hospitalized a few weeks ago and he had stopped taking his diuretic during that time so that he could go back and 4 to the hospital to see her .He says he restarted the Lasix but I'm not clear exactly when that happened. His dyspnea worsened and he presented to the emergency room. He denies any chest pain. He was found on admission to have a BNP of 3809, and hemoglobin of 6.7 hematocrit 22.8 MCV of 71.7 and an INR of 2.96. He  had not had any overt bleeding but was found to be Hemoccult-positive. He states that he has never had any GI evaluation, and has no history of anemia as far as he can remember. He denies any dysphagia odynophagia heartburn abdominal pain nausea change in bowel habits etc. He does admit to some constipation a couple of weeks ago but has not noted any melena or hematochezia.  He was transfused last evening and hemoglobin is 8.2 this morning. He says his breathing is better but probably not back to baseline. He is concerned about his lower extremity edema and says he has gained about 7 pounds over the past couple weeks. Family history is negative for colon cancer/ polyps. Patient has not been on any aspirin or NSAIDs.  We discussed potential endoscopic evaluation, and despite his daughter's protest he is fairly adamant at this time and he is not having any procedures done. He states he wants his congestive heart failure treated but he  will not have any procedures done with potential complications that might shorten his life. He says he is too old, does not have much time left and does not want to have any involved workup.  Past Medical History  Diagnosis Date  . Hypertension   . Shortness of breath     "w/any activity lately" (02/03/2012)  . CHF (congestive heart failure)     mild/note 02/03/2012  . Arthritis     "left foot; fingers" (02/03/2012)    Past Surgical History  Procedure Date  . Inguinal hernia repair     right  . Cataract extraction w/ intraocular lens  implant, bilateral ~ 2011  . Valve replacement     Dr. [facility]  . Cardiac valve replacement ~ 2001    "Semaj't know which one"  (02/03/2012)    Prior to Admission medications   Medication Sig Start Date End Date Taking? Authorizing Provider  furosemide (LASIX) 20 MG tablet Take 40 mg by mouth daily.   Yes Historical Provider, MD  hydrALAZINE (APRESOLINE) 25 MG tablet Take 37.5 mg by mouth 4 (four) times daily.   Yes Historical Provider, MD  metoprolol succinate (TOPROL-XL) 50 MG 24 hr tablet Take 75 mg by mouth daily. Take with or immediately following a meal.   Yes Historical Provider, MD  Multiple Vitamin (MULTIVITAMIN WITH MINERALS) TABS Take 1 tablet by mouth daily.   Yes Historical Provider, MD  omega-3 acid ethyl esters (LOVAZA) 1 G capsule Take 1 g by mouth  3 (three) times daily.   Yes Historical Provider, MD  simvastatin (ZOCOR) 10 MG tablet Take 10 mg by mouth at bedtime.   Yes Historical Provider, MD  triamcinolone cream (KENALOG) 0.1 % Apply 1 application topically 2 (two) times daily. Stop on 02/05/12   Yes Historical Provider, MD  warfarin (COUMADIN) 5 MG tablet Take 2.5-5 mg by mouth daily. Takes 2.5mg  everyday except 5mg  on Tuesday, Thursday, and Sunday   Yes Historical Provider, MD    Current Facility-Administered Medications  Medication Dose Route Frequency Provider Last Rate Last Dose  . 0.9 %  sodium chloride infusion   250 mL Intravenous PRN Kela Millin, MD      . acetaminophen (TYLENOL) tablet 650 mg  650 mg Oral Q6H PRN Adeline C Viyuoh, MD       Or  . acetaminophen (TYLENOL) suppository 650 mg  650 mg Rectal Q6H PRN Adeline C Viyuoh, MD      . bisacodyl (DULCOLAX) suppository 10 mg  10 mg Rectal Daily PRN Adeline C Viyuoh, MD      . furosemide (LASIX) injection 40 mg  40 mg Intravenous BID Corky Crafts, MD   40 mg at 02/04/12 0823  . metoprolol succinate (TOPROL-XL) 24 hr tablet 25 mg  25 mg Oral Daily Adeline C Viyuoh, MD   25 mg at 02/04/12 1009  . nitroGLYCERIN (NITROSTAT) SL tablet 0.4 mg  0.4 mg Sublingual Q5 Min x 3 PRN Adeline C Viyuoh, MD      . ondansetron (ZOFRAN) tablet 4 mg  4 mg Oral Q6H PRN Adeline C Viyuoh, MD       Or  . ondansetron (ZOFRAN) injection 4 mg  4 mg Intravenous Q6H PRN Adeline C Viyuoh, MD      . pantoprazole (PROTONIX) injection 40 mg  40 mg Intravenous Daily Adeline C Viyuoh, MD   40 mg at 02/04/12 1009  . simvastatin (ZOCOR) tablet 10 mg  10 mg Oral q1800 Kela Millin, MD   10 mg at 02/03/12 1852  . sodium chloride 0.9 % injection 3 mL  3 mL Intravenous Q12H Adeline C Viyuoh, MD   3 mL at 02/04/12 1009  . sodium chloride 0.9 % injection 3 mL  3 mL Intravenous Q12H Adeline C Viyuoh, MD      . sodium chloride 0.9 % injection 3 mL  3 mL Intravenous PRN Adeline C Viyuoh, MD      . [DISCONTINUED] furosemide (LASIX) injection 40 mg  40 mg Intravenous Daily Adeline C Viyuoh, MD      . [DISCONTINUED] ondansetron (ZOFRAN) injection 4 mg  4 mg Intravenous Q8H PRN Nelia Shi, MD        Allergies as of 02/03/2012  . (No Known Allergies)    History reviewed. No pertinent family history.  History   Social History  . Marital Status: Married    Spouse Name: N/A    Number of Children: N/A  . Years of Education: N/A   Occupational History  . Not on file.   Social History Main Topics  . Smoking status: Current Every Day Smoker -- 1.0 packs/day for 20 years     Types: Cigarettes  . Smokeless tobacco: Never Used     Comment: 02/03/2012 "quit smoking in my 30's"  . Alcohol Use: No  . Drug Use: No  . Sexually Active: No   Other Topics Concern  . Not on file   Social History Narrative  . No narrative on file  Review of Systems: Pertinent positive and negative review of systems were noted in the above HPI section.  All other review of systems was otherwise negative. Marland Kitchen  Physical Exam: Vital signs in last 24 hours: Temp:  [97.1 F (36.2 C)-98.9 F (37.2 C)] 98.3 F (36.8 C) (11/28 1011) Pulse Rate:  [58-104] 82  (11/28 1011) Resp:  [16-20] 18  (11/28 1011) BP: (119-155)/(48-73) 133/73 mmHg (11/28 1011) SpO2:  [96 %-99 %] 98 % (11/28 1011) Weight:  [165 lb 12.6 oz (75.2 kg)] 165 lb 12.6 oz (75.2 kg) (11/28 0534) Last BM Date: 02/03/12 General:   Alert,  Well-developed elderly white male sitting on the edge of the bed, well-nourished, pleasant and cooperative in NAD. Family in room Head:  Normocephalic and atraumatic. Eyes:  Sclera clear, no icterus.   Conjunctiva pale. Ears:  Normal auditory acuity. Nose:  No deformity, discharge,  or lesions. Mouth:  No deformity or lesions.   Neck:  Supple; no masses or thyromegaly. Lungs: Fine Rales left greater than right  Heart: Irregular rate and rhythm; artificial valve click, systolic murmur Abdomen:  Soft,nontender, BS active,nonpalp mass or hsm.   Rectal:  Deferred, documented Hemoccult positive on admission  Msk:  Symmetrical without gross deformities. . Pulses:  Normal pulses noted. Extremities:  2+ edema to the knees bilaterally, with chronic stasis changes Neurologic:  Alert and  oriented x4;  grossly normal neurologically. Skin:  Intact without significant lesions or rashes.. Psych:  Alert and cooperative. Normal mood and affect.  Intake/Output from previous day: 11/27 0701 - 11/28 0700 In: 950 [P.O.:300; Blood:650] Out: 1325 [Urine:1325] Intake/Output this shift: Total  I/O In: -  Out: 650 [Urine:650]  Lab Results:  Basename 02/04/12 1000 02/04/12 0455 02/03/12 1126 02/03/12 1113  WBC -- 5.7 -- 7.8  HGB 9.3* 8.2* 8.2* --  HCT 29.7* 26.0* 24.0* --  PLT -- 161 -- 268   BMET  Basename 02/04/12 0455 02/03/12 1126  NA 140 138  K 4.3 4.7  CL 108 103  CO2 25 --  GLUCOSE 92 110*  BUN 20 34*  CREATININE 1.18 1.40*  CALCIUM 9.1 --   LFT No results found for this basename: PROT,ALBUMIN,AST,ALT,ALKPHOS,BILITOT,BILIDIR,IBILI in the last 72 hours PT/INR  Basename 02/04/12 0455 02/03/12 1400  LABPROT 26.3* 29.3*  INR 2.56* 2.96*     Studies/Results: Dg Chest 2 View  02/03/2012  *RADIOLOGY REPORT*  Clinical Data: Short of breath.  Weakness.  CHEST - 2 VIEW  Comparison: None.  Findings: Cardiomegaly.  Pulmonary vascular congestion.  Elevation of the left hemidiaphragm.  Left basilar atelectasis.  No focal consolidation.  Mitral valve replacement.  Interstitial pulmonary edema is present.  IMPRESSION: Cardiomegaly, pulmonary vascular congestion and interstitial pulmonary edema compatible with mild CHF.   Original Report Authenticated By: Andreas Newport, M.D.     IMPRESSION:  #32  78 year old male with acute on chronic congestive heart failure #2 chronic atrial fibrillation #3 chronic anticoagulation with Coumadin #4 status post mitral valve replacement/St. Jude's valve #5 severe microcytic anemia-iron studies pending #6 Hemoccult-positive stool in the setting of therapeutic INR. Patient has no GI symptoms, given microcytic indices may have had a slow GI blood loss over many months. Possibilities include occult neoplasm, AVMs, gastropathy etc.  PLAN: Will allow regular diet, await iron studies Patient would need to be optimized from a cardiopulmonary standpoint prior to considering endoscopic evaluation, and ideally would need to come off of his Coumadin and be bridged. At this time he is fairly adamant that he  is not going to have a GI evaluation, and  if decision is made not to proceed with any endoscopic evaluation he will need iron supplementation as indicated, empiric PPI, and serial CBCs as an outpatient with transfusions as indicated. We will followup over the next few days    Amy Esterwood  02/04/2012, 11:01 AM

## 2012-02-04 NOTE — Progress Notes (Signed)
Pt 5 beat run of VT. Cardiology MD aware. Will continue to monitor

## 2012-02-04 NOTE — Progress Notes (Addendum)
ANTICOAGULATION CONSULT NOTE - Initial Consult  Pharmacy Consult for heparin when INR<2.5 Indication: chronic afib, st. Jude mitral valve No Known Allergies  Patient Measurements: Height: 5\' 7"  (170.2 cm) Weight: 165 lb 12.6 oz (75.2 kg) IBW/kg (Calculated) : 66.1  Heparin Dosing Weight: 75kg  Vital Signs: Temp: 97.1 F (36.2 C) (11/28 1500) Temp src: Oral (11/28 1500) BP: 109/57 mmHg (11/28 1500) Pulse Rate: 70  (11/28 1500)  Labs:  Basename 02/04/12 1552 02/04/12 1000 02/04/12 0455 02/04/12 0445 02/03/12 2158 02/03/12 1400 02/03/12 1126 02/03/12 1113  HGB 8.4* 9.3* -- -- -- -- -- --  HCT 26.9* 29.7* 26.0* -- -- -- -- --  PLT -- -- 161 -- -- -- -- 268  APTT -- -- -- -- -- 46* -- --  LABPROT 26.3* -- 26.3* -- -- 29.3* -- --  INR 2.56* -- 2.56* -- -- 2.96* -- --  HEPARINUNFRC -- -- -- -- -- -- -- --  CREATININE -- -- 1.18 -- -- -- 1.40* --  CKTOTAL -- -- -- -- -- -- -- --  CKMB -- -- -- -- -- -- -- --  TROPONINI -- -- -- <0.30 <0.30 -- -- --    Estimated Creatinine Clearance: 42.8 ml/min (by C-G formula based on Cr of 1.18).   Medical History: Past Medical History  Diagnosis Date  . Hypertension   . Shortness of breath     "w/any activity lately" (02/03/2012)  . CHF (congestive heart failure)     mild/note 02/03/2012  . Arthritis     "left foot; fingers" (02/03/2012)  . Dysrhythmia     Medications:  Warfarin Home dose 2.5 qday X 5 mg TTSun last dose 11/26  Assessment: 77 year old male with chronic afib and st jude mitral valve on warfarin prior to admit. Patient admitted with dyspnea on exertion and found to have FOBT+ in ED. Holding warfarin for now pending further workup. D/w Dr. Anne Fu this afternoon and given mitral valve and that INR is now at low end of goal will recheck INR this afternoon and start heparin if appropriate.  Upon INR recheck, INR 2.56 (same as this AM).  Discussed with Dr. Anne Fu, will go ahead and begin IV heparin since further INR  decline is expected, and he is high risk for clot given mechanical valve.  Goal of Therapy:  INR 2.5-3.5 Heparin level 0.3-0.7 units/ml Monitor platelets by anticoagulation protocol: Yes   Plan:  1. Start heparin at 1050 units/hr. (no bolus) 2. Check heparin level 8 hrs after gtt starts. 3. Daily heparin level and CBC. 4. F/U plans for resuming Coumadin.  Koralyn Prestage C 02/04/2012,7:12 PM

## 2012-02-04 NOTE — Consult Note (Signed)
Patient seen, examined, and I agree with the above documentation, including the assessment and plan. Microcytic anemia with heme + stool on warfarin.  No overt or acute bleeding currently. Pt remains hesitant to agree to any GI procedure at this point. Can re-discuss once optimized from cardiopulmonary standpoint, also warfarin would need to be held, INR < 1.5.  Heparin gtt decision per cardiology given MVR. If he refuses procedures, then would replete iron, perhaps IV while here, and then continue oral supplementation at home Will follow

## 2012-02-04 NOTE — Progress Notes (Signed)
ANTICOAGULATION CONSULT NOTE - Initial Consult  Pharmacy Consult for heparin when INR<2.5 Indication: chronic afib, st. Jude mitral valve No Known Allergies  Patient Measurements: Height: 5\' 7"  (170.2 cm) Weight: 165 lb 12.6 oz (75.2 kg) IBW/kg (Calculated) : 66.1  Heparin Dosing Weight: 75kg  Vital Signs: Temp: 98.3 F (36.8 C) (11/28 1011) Temp src: Oral (11/28 1011) BP: 133/73 mmHg (11/28 1011) Pulse Rate: 82  (11/28 1011)  Labs:  Basename 02/04/12 1000 02/04/12 0455 02/04/12 0445 02/03/12 2158 02/03/12 1400 02/03/12 1126 02/03/12 1113  HGB 9.3* 8.2* -- -- -- -- --  HCT 29.7* 26.0* -- -- -- 24.0* --  PLT -- 161 -- -- -- -- 268  APTT -- -- -- -- 46* -- --  LABPROT -- 26.3* -- -- 29.3* -- --  INR -- 2.56* -- -- 2.96* -- --  HEPARINUNFRC -- -- -- -- -- -- --  CREATININE -- 1.18 -- -- -- 1.40* --  CKTOTAL -- -- -- -- -- -- --  CKMB -- -- -- -- -- -- --  TROPONINI -- -- <0.30 <0.30 -- -- --    Estimated Creatinine Clearance: 42.8 ml/min (by C-G formula based on Cr of 1.18).   Medical History: Past Medical History  Diagnosis Date  . Hypertension   . Shortness of breath     "w/any activity lately" (02/03/2012)  . CHF (congestive heart failure)     mild/note 02/03/2012  . Arthritis     "left foot; fingers" (02/03/2012)  . Dysrhythmia     Medications:  Warfarin Home dose 2.5 qday X 5 mg TTSun last dose 11/26  Assessment: 78 year old male with chronic afib and st jude mitral valve on warfarin prior to admit. Patient admitted with dyspnea on exertion and found to have FOBT+ in ED. Holding warfarin for now pending further workup. D/w Dr. Anne Fu this afternoon and given mitral valve and that INR is now at low end of goal will recheck INR this afternoon and start heparin if appropriate.  Goal of Therapy:  INR 2.5-3.5 Heparin level 0.3-0.7 units/ml Monitor platelets by anticoagulation protocol: Yes   Plan:  Recheck INR at 1800 Start IV heparin when  INR<2.5  Alfred Dennis 02/04/2012,2:28 PM

## 2012-02-05 DIAGNOSIS — D509 Iron deficiency anemia, unspecified: Secondary | ICD-10-CM | POA: Diagnosis present

## 2012-02-05 LAB — CBC
HCT: 26.1 % — ABNORMAL LOW (ref 39.0–52.0)
MCH: 23.3 pg — ABNORMAL LOW (ref 26.0–34.0)
MCV: 73.3 fL — ABNORMAL LOW (ref 78.0–100.0)
Platelets: 181 10*3/uL (ref 150–400)
RDW: 18.2 % — ABNORMAL HIGH (ref 11.5–15.5)

## 2012-02-05 LAB — TYPE AND SCREEN
Unit division: 0
Unit division: 0

## 2012-02-05 LAB — HEPARIN LEVEL (UNFRACTIONATED)
Heparin Unfractionated: 0.4 IU/mL (ref 0.30–0.70)
Heparin Unfractionated: 0.44 IU/mL (ref 0.30–0.70)

## 2012-02-05 LAB — BASIC METABOLIC PANEL
CO2: 26 mEq/L (ref 19–32)
Calcium: 8.7 mg/dL (ref 8.4–10.5)
Creatinine, Ser: 1.25 mg/dL (ref 0.50–1.35)
Glucose, Bld: 114 mg/dL — ABNORMAL HIGH (ref 70–99)

## 2012-02-05 MED ORDER — METOPROLOL SUCCINATE ER 50 MG PO TB24
50.0000 mg | ORAL_TABLET | Freq: Every day | ORAL | Status: DC
  Administered 2012-02-05 – 2012-02-11 (×6): 50 mg via ORAL
  Filled 2012-02-05 (×8): qty 1

## 2012-02-05 MED ORDER — PANTOPRAZOLE SODIUM 40 MG PO TBEC
40.0000 mg | DELAYED_RELEASE_TABLET | Freq: Every day | ORAL | Status: DC
  Administered 2012-02-05 – 2012-02-18 (×13): 40 mg via ORAL
  Filled 2012-02-05 (×13): qty 1

## 2012-02-05 MED ORDER — SODIUM CHLORIDE 0.9 % IV SOLN
125.0000 mg | Freq: Once | INTRAVENOUS | Status: AC
  Administered 2012-02-05: 125 mg via INTRAVENOUS
  Filled 2012-02-05: qty 10

## 2012-02-05 MED ORDER — FERROUS SULFATE 325 (65 FE) MG PO TABS
325.0000 mg | ORAL_TABLET | Freq: Three times a day (TID) | ORAL | Status: DC
  Administered 2012-02-05 – 2012-02-09 (×10): 325 mg via ORAL
  Filled 2012-02-05 (×17): qty 1

## 2012-02-05 MED ORDER — FERROUS SULFATE 325 (65 FE) MG PO TABS: 325.0000 mg | ORAL_TABLET | Freq: Two times a day (BID) | ORAL | Status: DC

## 2012-02-05 NOTE — Progress Notes (Signed)
ANTICOAGULATION CONSULT NOTE - Follow Up Consult  Pharmacy Consult for heparin when INR<2.5 Indication: chronic afib, st. Jude mitral valve No Known Allergies  Patient Measurements: Height: 5\' 7"  (170.2 cm) Weight: 165 lb 12.6 oz (75.2 kg) IBW/kg (Calculated) : 66.1  Heparin Dosing Weight: 75kg  Vital Signs: Temp: 98.2 F (36.8 C) (11/28 2002) Temp src: Oral (11/28 2002) BP: 113/47 mmHg (11/28 2002) Pulse Rate: 80  (11/28 2002)  Labs:  Basename 02/05/12 0310 02/04/12 2146 02/04/12 1552 02/04/12 0455 02/04/12 0445 02/03/12 2158 02/03/12 1400 02/03/12 1126 02/03/12 1113  HGB 8.3* 8.6* -- -- -- -- -- -- --  HCT 26.1* 26.7* 26.9* -- -- -- -- -- --  PLT 181 -- -- 161 -- -- -- -- 268  APTT -- -- -- -- -- -- 46* -- --  LABPROT 25.2* -- 26.3* 26.3* -- -- -- -- --  INR 2.42* -- 2.56* 2.56* -- -- -- -- --  HEPARINUNFRC 0.40 -- -- -- -- -- -- -- --  CREATININE -- -- -- 1.18 -- -- -- 1.40* --  CKTOTAL -- -- -- -- -- -- -- -- --  CKMB -- -- -- -- -- -- -- -- --  TROPONINI -- -- -- -- <0.30 <0.30 -- -- --    Estimated Creatinine Clearance: 42.8 ml/min (by C-G formula based on Cr of 1.18).   Medical History: Past Medical History  Diagnosis Date  . Hypertension   . Shortness of breath     "w/any activity lately" (02/03/2012)  . CHF (congestive heart failure)     mild/note 02/03/2012  . Arthritis     "left foot; fingers" (02/03/2012)  . Dysrhythmia     Medications:  Warfarin Home dose 2.5 qday X 5 mg TTSun last dose 11/26  Assessment: Alfred Dennis with chronic afib and st jude mitral valve on warfarin prior to admit. Patient admitted with dyspnea on exertion and found to have FOBT+ in ED. On IV heparin and holding warfarin for now pending further workup. Heparin level (0.4) is at-goal on 1050 units/hr.   Goal of Therapy:  INR 2.5-3.5 Heparin level 0.3-0.7 units/ml Monitor platelets by anticoagulation protocol: Yes   Plan:  1. Continue IV heparin at 1050 units/hr.    2. Confirmatory heparin level at 11:00 AM. 3. F/U plans for resuming Coumadin.  [name] 02/05/2012,3:50 AM

## 2012-02-05 NOTE — Progress Notes (Addendum)
[facility] PROGRESS NOTE  [facility] WUJ:[id number] DOB: 21-Oct-1923 DOA: 02/03/2012 PCP: No primary provider on file.  Assessment/Plan: Acute on chronic systolic CHF  -Last ejection fraction 40%  -Recheck echocardiogram  -Continue furosemide IV 40 mg twice a day  -Appreciate cardiology recommendations  -Continue Toprol-XL  -Daily weights, strict I.'s and O.'s  -neg 3313cc/2kg for the admission -TSH 3.922 Blood loss Anemia -Transfused 2 units PRBCs 11/27 -appreciate GI followup -Hold warfarin  -With high risk of thrombosis due to St. Jude MVR, start heparin gtt Chronic atrial fibrillation/history of St. Jude mitral valve replacement -Continue metoprolol succinate-dose increased--discussed with Dr. Anne Fu -Rate controlled currently  -Anticoagulation as discussed above  Microcytic anemia  -Give 1 dose ferric gluconate IV, start ferrous sulfate 325 3 times a day -Appreciate GI recommendations -Endoscopy planned on Monday if INR is less than 1.5 Venous stasis dermatitis-legs  -No need for antibiotics for his legs     Family Communication:   Son at bedside Disposition Plan:   Home when medically stable      Procedures/Studies: Dg Chest 2 View  02/03/2012  *RADIOLOGY REPORT*  Clinical Data: Short of breath.  Weakness.  CHEST - 2 VIEW  Comparison: None.  Findings: Cardiomegaly.  Pulmonary vascular congestion.  Elevation of the left hemidiaphragm.  Left basilar atelectasis.  No focal consolidation.  Mitral valve replacement.  Interstitial pulmonary edema is present.  IMPRESSION: Cardiomegaly, pulmonary vascular congestion and interstitial pulmonary edema compatible with mild CHF.   Original Report Authenticated By: Andreas Newport, M.D.          Subjective: Patient is breathing better. He denies any fevers, chills, chest pain, nausea, vomiting, diarrhea, abdominal pain, hematochezia, melena, dizziness  Objective: Filed Vitals:   02/04/12 2002 02/05/12 0534  02/05/12 1024 02/05/12 1427  BP: 113/47 132/39 125/51 102/48  Pulse: 80 74 88 67  Temp: 98.2 F (36.8 C) 98.7 F (37.1 C)  97.6 F (36.4 C)  TempSrc: Oral Oral  Oral  Resp: 18 18 17 19   Height:      Weight:  73.2 kg (161 lb 6 oz)    SpO2: 99% 99% 98% 98%    Intake/Output Summary (Last 24 hours) at 02/05/12 1625 Last data filed at 02/05/12 1428  Gross per 24 hour  Intake 1369.86 ml  Output   3005 ml  Net -1635.14 ml   Weight change: -2 kg (-4 lb 6.5 oz) Exam:   General:  Pt is alert, follows commands appropriately, not in acute distress  HEENT: No icterus, No thrush,  [place]/AT  Cardiovascular: RRR, S1/S2, no rubs, no gallops  Respiratory: CTA bilaterally, no wheezing, no crackles, no rhonchi  Abdomen: Soft/+BS, non tender, non distended, no guarding  Extremities: 3+edema,No lymphangitis, No petechiae, No rashes, no synovitis  Data Reviewed: Basic Metabolic Panel:  Lab 02/05/12 9562 02/04/12 0455 02/03/12 1126  NA 139 140 138  K 3.5 4.3 4.7  CL 104 108 103  CO2 26 25 --  GLUCOSE 114* 92 110*  BUN 22 20 34*  CREATININE 1.25 1.18 1.40*  CALCIUM 8.7 9.1 --  MG -- -- --  PHOS -- -- --   Liver Function Tests: No results found for this basename: AST:5,ALT:5,ALKPHOS:5,BILITOT:5,PROT:5,ALBUMIN:5 in the last 168 hours No results found for this basename: LIPASE:5,AMYLASE:5 in the last 168 hours No results found for this basename: AMMONIA:5 in the last 168 hours CBC:  Lab 02/05/12 1030 02/05/12 0310 02/04/12 2146 02/04/12 1552 02/04/12 1000 02/04/12 0455 02/03/12 1113  WBC -- 6.9 -- -- --  5.7 7.8  NEUTROABS -- -- -- -- -- -- 4.8  HGB 8.1* 8.3* 8.6* 8.4* 9.3* -- --  HCT 25.6* 26.1* 26.7* 26.9* 29.7* -- --  MCV -- 73.3* -- -- -- 73.7* 71.7*  PLT -- 181 -- -- -- 161 268   Cardiac Enzymes:  Lab 02/04/12 0445 02/03/12 2158  CKTOTAL -- --  CKMB -- --  CKMBINDEX -- --  TROPONINI <0.30 <0.30   BNP: No components found with this basename: POCBNP:5 CBG: No results  found for this basename: GLUCAP:5 in the last 168 hours  No results found for this or any previous visit (from the past 240 hour(s)).   Scheduled Meds:   . ferric gluconate (FERRLECIT/NULECIT) IV  125 mg Intravenous Once  . ferrous sulfate  325 mg Oral TID WC  . furosemide  40 mg Intravenous BID  . metoprolol succinate  50 mg Oral Daily  . pantoprazole  40 mg Oral QAC lunch  . simvastatin  10 mg Oral q1800  . sodium chloride  3 mL Intravenous Q12H  . sodium chloride  3 mL Intravenous Q12H  . [DISCONTINUED] ferrous sulfate  325 mg Oral BID WC  . [DISCONTINUED] metoprolol succinate  25 mg Oral Daily  . [DISCONTINUED] pantoprazole (PROTONIX) IV  40 mg Intravenous Daily   Continuous Infusions:   . heparin 1,050 Units/hr (02/05/12 1200)     Taysom Glymph, DO  [facility] Pager [phone]  If 7PM-7AM, please contact night-coverage [email or web address] Password [email or web address] 02/05/2012, 4:25 PM   LOS: 2 days

## 2012-02-05 NOTE — Clinical Documentation Improvement (Signed)
Anemia Blood Loss Clarification  THIS DOCUMENT IS NOT A PERMANENT PART OF THE MEDICAL RECORD  RESPOND TO THE THIS QUERY, FOLLOW THE INSTRUCTIONS BELOW:  1. If needed, update documentation for the patient's encounter via the notes activity.  2. Access this query again and click edit on the In [employer].  3. After updating, or not, click F2 to complete all highlighted (required) fields concerning your review. Select "additional documentation in the medical record" OR "no additional documentation provided".  4. Click Sign note button.  5. The deficiency will fall out of your In Basket *Please let us know if you are not able to complete this workflow by phone or e-mail (listed below).        02/05/12  Dear Dr. Jomarie Longs Marton Redwood  In an effort to better capture your patient's severity of illness, reflect appropriate length of stay and utilization of resources, a review of the patient medical record has revealed the following indicators.    Based on your clinical judgment, please clarify and document in a progress note and/or discharge summary the clinical condition associated with the following supporting information:  In responding to this query please exercise your independent judgment.  The fact that a query is asked, does not imply that any particular answer is desired or expected.  Possible Clinical Conditions?    Acute Blood Loss Anemia   Acute on chronic blood loss anemia   Chronic blood loss anemia   Other Condition________________   Cannot Clinically Determine    Supporting Information:  Risk Factors: (As per notes)  "Anemia/GIB", " Anemia with positive FOBT"  Signs and Symptom: (As per notes)". Anemia/GI bleed-per primary team/GI. Awaiting colonoscopy although he is hesitant to agree to any GI procedure at this point. Warfarin is currently being held. INR would need to be optimized to less than 1.5. I have reviewed gastroenterology  note"   Diagnostics: LABS: Component      Hemoglobin HCT  Latest Ref Rng      13.0 - 17.0 g/dL 16.1 - 09.6 %  04/54/0981     11:13 AM 6.7 (LL) 22.8 (L)  02/03/2012     11:26 AM 8.2 (L) 24.0 (L)  02/04/2012     4:55 AM 8.2 (L) 26.0 (L)  02/04/2012     10:00 AM 9.3 (L) 29.7 (L)  02/04/2012     3:52 PM 8.4 (L) 26.9 (L)  02/04/2012     9:46 PM 8.6 (L) 26.7 (L)  02/05/2012      8.3 (L) 26.1 (L)    Treatments: Transfusion:(As per notes)-Transfused 2 units PRBCs yesterday  Serial H&H monitoring   Reviewed: made changes to my progress note 12/10 Thank You,  Joanette Gula Delk RN,BSN Clinical Documentation Specialist: [phone] Pager Health Information Management [place]

## 2012-02-05 NOTE — Progress Notes (Signed)
     [facility] Daily Rounding Note 02/05/2012, 10:48 AM  SUBJECTIVE:       Breathing better, not at baseline best yet.  No pain.  Not dizzy.  No CP Weight is 73.2 kg c/w 75.2 kg yesterday.  Now willing to proceed to colon/egd.   OBJECTIVE:         Vital signs in last 24 hours:    Temp:  [97.1 F (36.2 C)-98.7 F (37.1 C)] 98.7 F (37.1 C) (11/29 0534) Pulse Rate:  [70-88] 88  (11/29 1024) Resp:  [17-18] 17  (11/29 1024) BP: (109-132)/(39-57) 125/51 mmHg (11/29 1024) SpO2:  [96 %-99 %] 98 % (11/29 1024) Weight:  [73.2 kg (161 lb 6 oz)] 73.2 kg (161 lb 6 oz) (11/29 0534) Last BM Date: 02/04/12 General: frail, aged, NAD   Heart: Irrefular/irregular with mech valve click Chest: fine rales at bases.  No acute dyspnea.  Abdomen: soft , NT, active BS, ND  Extremities: 3+ LE edema with chronic brawny and woody changes, some weeping.  Neuro/Psych:  Pleasant, not disoriented or confused.  Intake/Output from previous day: 11/28 0701 - 11/29 0700 In: 1070.4 [P.O.:840; I.V.:219.4; IV Piggyback:11] Out: 2800 [Urine:2800]  Intake/Output this shift: Total I/O In: 220 [P.O.:220] Out: 980 [Urine:980]  Lab Results:  Basename 02/05/12 0310 02/04/12 2146 02/04/12 1552 02/04/12 0455 02/03/12 1113  WBC 6.9 -- -- 5.7 7.8  HGB 8.3* 8.6* 8.4* -- --  HCT 26.1* 26.7* 26.9* -- --  PLT 181 -- -- 161 268   BMET  Basename 02/05/12 0310 02/04/12 0455 02/03/12 1126  NA 139 140 138  K 3.5 4.3 4.7  CL 104 108 103  CO2 26 25 --  GLUCOSE 114* 92 110*  BUN 22 20 34*  CREATININE 1.25 1.18 1.40*  CALCIUM 8.7 9.1 --    PT/INR  Basename 02/05/12 0310 02/04/12 1552  LABPROT 25.2* 26.3*  INR 2.42* 2.56*   Studies/Results: Dg Chest 2 View  02/03/2012  *RADIOLOGY REPORT*  Clinical Data: Short of breath.  Weakness.  CHEST - 2 VIEW  Comparison: None.  Findings: Cardiomegaly.  Pulmonary vascular congestion.  Elevation of the left hemidiaphragm.  Left basilar atelectasis.  No focal consolidation.   Mitral valve replacement.  Interstitial pulmonary edema is present.  IMPRESSION: Cardiomegaly, pulmonary vascular congestion and interstitial pulmonary edema compatible with mild CHF.   Original Report Authenticated By: Andreas Newport, M.D.     ASSESMENT: *  Microcytic anemia.  Heme positive. Hgb stable.  No Iron in use PTA. FOB positive on admission. On empiric IV Protonix (no PPI PTA).  Pt now agreeable to colonoscopy/EGD *  Mechanical AVR.  Chronic Coumadin on hold. Interim Heparin GTT in place.  *  Atrial Fib *  Acute on chronic systolic and diastolic heart failure.  *  Insomnia.  Will defer decsion re sleep aid meds to hospitalist.       PLAN: *  Could do colon/egd as soon as tomorrow, may be better to wait and give pt another 36 hours of diuresis before embarking on prep. *  Change to po PPI.  CBC in AM.   LOS: 2 days   Jennye Moccasin  02/05/2012, 10:48 AM Pager: [phone]

## 2012-02-05 NOTE — Progress Notes (Signed)
ANTICOAGULATION CONSULT NOTE - Follow Up Consult  Pharmacy Consult for heparin when INR<2.5 Indication: chronic afib, st. Jude mitral valve No Known Allergies  Patient Measurements: Height: 5\' 7"  (170.2 cm) Weight: 161 lb 6 oz (73.2 kg) IBW/kg (Calculated) : 66.1  Heparin Dosing Weight: 75kg  Vital Signs: Temp: 98.7 F (37.1 C) (11/29 0534) Temp src: Oral (11/29 0534) BP: 125/51 mmHg (11/29 1024) Pulse Rate: 88  (11/29 1024)  Labs:  Basename 02/05/12 1030 02/05/12 0310 02/04/12 2146 02/04/12 1552 02/04/12 0455 02/04/12 0445 02/03/12 2158 02/03/12 1400 02/03/12 1126 02/03/12 1113  HGB 8.1* 8.3* -- -- -- -- -- -- -- --  HCT 25.6* 26.1* 26.7* -- -- -- -- -- -- --  PLT -- 181 -- -- 161 -- -- -- -- 268  APTT -- -- -- -- -- -- -- 46* -- --  LABPROT -- 25.2* -- 26.3* 26.3* -- -- -- -- --  INR -- 2.42* -- 2.56* 2.56* -- -- -- -- --  HEPARINUNFRC 0.44 0.40 -- -- -- -- -- -- -- --  CREATININE -- 1.25 -- -- 1.18 -- -- -- 1.40* --  CKTOTAL -- -- -- -- -- -- -- -- -- --  CKMB -- -- -- -- -- -- -- -- -- --  TROPONINI -- -- -- -- -- <0.30 <0.30 -- -- --    Estimated Creatinine Clearance: 40.4 ml/min (by C-G formula based on Cr of 1.25).   Medications:  Warfarin Home dose 2.5 qday X 5 mg TTSun last dose 11/26  Assessment: 78 year old male with chronic afib and st jude mitral valve on warfarin prior to admit. Patient admitted with dyspnea on exertion and found to have FOBT+ in ED. On IV heparin and holding warfarin for colonoscopy/endocopy. Repeat heparin level (0.44) remains thereapeutic on 1050 units/hr. INR 2.42. Per GI note desire INR < 1./5 for procedures.  H/H 8.1/25.6.  PLTC 181 - stable.    Goal of Therapy:  INR 2.5-3.5 Heparin level 0.3-0.7 units/ml Monitor platelets by anticoagulation protocol: Yes   Plan:  1. Continue IV heparin at 1050 units/hr.  2. Daily HL and CBC while on heparin bridge for mechanical AVR. 3. Colonoscopy/egd once INR < 1.5 Herby Abraham,  Pharm.D. [phone] 02/05/2012 11:59 AM

## 2012-02-05 NOTE — Progress Notes (Signed)
Utilization Review Completed.   Shakema Surita, RN, BSN Nurse Case Manager  [phone]  

## 2012-02-05 NOTE — Progress Notes (Signed)
Patient seen, examined, and I agree with the above documentation, including the assessment and plan. Hgb is stable without overt bleeding. Pt being followed for acute CHF exacerbation and still not returned to baseline.  IV diuresis ongoing. We have discussed EGD/colonoscopy and he would like given iron def anemia.  His INR is 2.4 and will need additional time to drift to < 1.5.  He is already on heparin gtt to cover his mitral valve.  This also will provide more time for ongoing diuresis and improvement in CHF exacerbation before proceeding. Anticipate EGD/colon likely Monday Monitor Hgb

## 2012-02-05 NOTE — Progress Notes (Signed)
Subjective:  Sitting up on side in bed, comfortable, breathing well but he does complain of his left nostril being "clogged ". No bleeding episodes. Yesterday evening, heparin IV was started. He did have question about colonoscopy.  Objective:  Vital Signs in the last 24 hours: Temp:  [97.1 F (36.2 C)-98.7 F (37.1 C)] 98.7 F (37.1 C) (11/29 0534) Pulse Rate:  [70-82] 74  (11/29 0534) Resp:  [18] 18  (11/29 0534) BP: (109-133)/(39-73) 132/39 mmHg (11/29 0534) SpO2:  [96 %-99 %] 99 % (11/29 0534) Weight:  [73.2 kg (161 lb 6 oz)] 73.2 kg (161 lb 6 oz) (11/29 0534)  Intake/Output from previous day: 11/28 0701 - 11/29 0700 In: 491 [P.O.:480; IV Piggyback:11] Out: 2800 [Urine:2800]   Physical Exam: General: Well developed, well nourished, in no acute distress.  Head: Normocephalic and atraumatic.  Lungs:Mild decrease at bases. No wheeze  Heart: Irreg Irreg, S1 click. No murmur, rubs or gallops.  Abdomen: soft, non-tender, positive bowel sounds.  Extremities: No clubbing or cyanosis.3+ chronic appearing LE edema. Slightly improved today, slightly reddened. Neurologic: Alert and oriented x 3.    Lab Results:  Basename 02/05/12 0310 02/04/12 2146 02/04/12 0455  WBC 6.9 -- 5.7  HGB 8.3* 8.6* --  PLT 181 -- 161    Basename 02/05/12 0310 02/04/12 0455  NA 139 140  K 3.5 4.3  CL 104 108  CO2 26 25  GLUCOSE 114* 92  BUN 22 20  CREATININE 1.25 1.18    Basename 02/04/12 0445 02/03/12 2158  TROPONINI <0.30 <0.30     Telemetry: Brief periods of rapid ventricular response atrial fibrillation. Majority of time, he is below 100 beats per minute. Personally viewed.    Assessment/Plan:  Principal Problem:  *CHF (congestive heart failure) Active Problems:  Chronic a-fib  ARF (acute renal failure)  HTN (hypertension)  S/P MVR (mitral valve replacement)  Anemia  GIB (gastrointestinal bleeding)  78 year old male who was admitted with anemia, status post transfusion,  presumed GI bleed with mechanical St. Jude mitral valve, chronic anticoagulation, hypertension, persistent chronic atrial fibrillation fairly well rate controlled with chronic lower extremity edema consistent with acute on chronic diastolic heart failure.  1. Mechanical mitral valve-heparin IV started last night. INR less than 2.5. Resume Coumadin when comfortable by GI perspective.  2. Atrial fibrillation-persistent. He does have peaks of rapid ventricular response. I will try to increase his metoprolol from 25 mg a day to 50 mg a day. Hopefully this will help. Overall he is fairly well rate controlled.  3. Acute on chronic diastolic/systolic heart failure-prior ejection fraction in the 40% range. Still has lower extremity edema although improved. Creatinine is currently 1.25 today. I would continue with 40 mg IV twice a day Lasix. Potassium supplementation as needed.  4. Anemia/GI bleed-per primary team/GI. Awaiting colonoscopy although he is hesitant to agree to any GI procedure at this point. Warfarin is currently being held. INR would need to be optimized to less than 1.5. I have reviewed gastroenterology note.   Gudelia Eugene 02/05/2012, 8:02 AM

## 2012-02-06 DIAGNOSIS — I10 Essential (primary) hypertension: Secondary | ICD-10-CM

## 2012-02-06 DIAGNOSIS — D509 Iron deficiency anemia, unspecified: Secondary | ICD-10-CM

## 2012-02-06 DIAGNOSIS — I5023 Acute on chronic systolic (congestive) heart failure: Secondary | ICD-10-CM

## 2012-02-06 LAB — CBC
MCHC: 30.9 g/dL (ref 30.0–36.0)
RDW: 19 % — ABNORMAL HIGH (ref 11.5–15.5)

## 2012-02-06 LAB — BASIC METABOLIC PANEL
BUN: 23 mg/dL (ref 6–23)
GFR calc Af Amer: 54 mL/min — ABNORMAL LOW (ref >90)
GFR calc non Af Amer: 47 mL/min — ABNORMAL LOW (ref >90)
Potassium: 3.5 mEq/L (ref 3.5–5.1)
Sodium: 140 mEq/L (ref 135–145)

## 2012-02-06 LAB — PROTIME-INR
INR: 1.59 — ABNORMAL HIGH (ref 0.00–1.49)
Prothrombin Time: 18.5 seconds — ABNORMAL HIGH (ref 11.6–15.2)

## 2012-02-06 LAB — HEPARIN LEVEL (UNFRACTIONATED): Heparin Unfractionated: 0.93 IU/mL — ABNORMAL HIGH (ref 0.30–0.70)

## 2012-02-06 MED ORDER — POTASSIUM CHLORIDE CRYS ER 20 MEQ PO TBCR
40.0000 meq | EXTENDED_RELEASE_TABLET | Freq: Two times a day (BID) | ORAL | Status: AC
  Administered 2012-02-06 – 2012-02-07 (×4): 40 meq via ORAL
  Filled 2012-02-06 (×5): qty 2

## 2012-02-06 MED ORDER — PEG-KCL-NACL-NASULF-NA ASC-C 100 G PO SOLR
1.0000 | Freq: Once | ORAL | Status: AC
  Administered 2012-02-07: 100 g via ORAL
  Filled 2012-02-06 (×2): qty 1

## 2012-02-06 MED ORDER — SODIUM CHLORIDE 0.45 % IV SOLN
INTRAVENOUS | Status: DC
  Administered 2012-02-06: 12:00:00 via INTRAVENOUS

## 2012-02-06 NOTE — Progress Notes (Signed)
Pt was asleep and had 7 beats of Vtach. Pt asymptomatic. VS stable.  NP on call made aware. No new orders received at this time. Will cont to monitor pt.

## 2012-02-06 NOTE — Progress Notes (Signed)
[facility] Progress Note  Subjective: Patient feels better today, dyspnea has improved. Eating well.  No abd complaints.  No overt bleeding. Wife and daughter at bedside On heparin gtt  Objective:  Vital signs in last 24 hours: Temp:  [97.6 F (36.4 C)-98.6 F (37 C)] 98.6 F (37 C) (11/30 0424) Pulse Rate:  [66-78] 78  (11/30 1027) Resp:  [18-19] 18  (11/30 1027) BP: (102-125)/(46-57) 125/57 mmHg (11/30 1027) SpO2:  [96 %-100 %] 100 % (11/30 1027) Weight:  [158 lb 15.2 oz (72.1 kg)] 158 lb 15.2 oz (72.1 kg) (11/30 0424) Last BM Date: 02/04/12 Gen: awake, alert, NAD HEENT: anicteric, op clear CV: irreg, irreg, s1 click Pulm: CTA b/l Abd: soft, NT/ND, +BS throughout Ext: 1+ LE edema Neuro: nonfocal   Intake/Output from previous day: 11/29 0701 - 11/30 0700 In: 903.6 [P.O.:680; I.V.:215.6; IV Piggyback:8] Out: 3030 [Urine:3030] Intake/Output this shift: Total I/O In: 120 [P.O.:120] Out: 1225 [Urine:1225]  Lab Results:  [facility] 02/06/12 0515 02/05/12 1030 02/05/12 0310 02/04/12 0455  WBC 5.3 -- 6.9 5.7  HGB 8.0* 8.1* 8.3* --  HCT 25.9* 25.6* 26.1* --  PLT 197 -- 181 161   BMET  Basename 02/06/12 0515 02/05/12 0310 02/04/12 0455  NA 140 139 140  K 3.5 3.5 4.3  CL 106 104 108  CO2 28 26 25   GLUCOSE 92 114* 92  BUN 23 22 20   CREATININE 1.34 1.25 1.18  CALCIUM 8.6 8.7 9.1    PT/INR  Basename 02/06/12 0515 02/05/12 0310  LABPROT 18.5* 25.2*  INR 1.59* 2.42*    Assessment / Plan: 78 year old history of atrial fibrillation, status post mitral valve replacement on Coumadin at home presenting with profound heme-positive microcytic anemia/iron deficiency  1.  Iron deficiency anemia/heme positive stool -- he has continued to improve from a CHF standpoint with IV diuresis, hemoglobin has been stable after transfusion with no overt bleeding. I had a long discussion with the patient and his family at bedside today including the risks and benefits of  endoscopic intervention versus iron supplementation and daily PPI with watchful waiting. They have elected for endoscopic procedure, understanding the risks and benefits, and are agreeable to proceed. --He received IV iron while hospitalized --INR 1.59 today, and drifting down on heparin drip. --Will plan EGD and colonoscopy with Dr. Leone Payor on Monday. Prep to begin tomorrow, heparin drip to be held 6 hours prior to procedure    Principal Problem:  *CHF (congestive heart failure) Active Problems:  Chronic a-fib  ARF (acute renal failure)  HTN (hypertension)  S/P MVR (mitral valve replacement)  GIB (gastrointestinal bleeding)  Iron deficiency anemia  Acute on chronic systolic CHF (congestive heart failure)  Non-sustained ventricular tachycardia     LOS: 3 days   Alfred Dennis M  02/06/2012, 11:20 AM

## 2012-02-06 NOTE — Progress Notes (Signed)
ANTICOAGULATION CONSULT NOTE - Follow Up Consult  Pharmacy Consult for heparin Indication: chronic afib, st Jude mitral valve  No Known Allergies  Patient Measurements: Height: 5\' 7"  (170.2 cm) Weight: 158 lb 15.2 oz (72.1 kg) IBW/kg (Calculated) : 66.1  Heparin Dosing Weight: 75 kg  Vital Signs: Temp: 98.3 F (36.8 C) (11/30 1359) Temp src: Oral (11/30 1359) BP: 128/58 mmHg (11/30 1405) Pulse Rate: 61  (11/30 1359)  Labs:  Basename 02/06/12 1428 02/06/12 0515 02/05/12 1030 02/05/12 0310 02/04/12 1552 02/04/12 0455 02/04/12 0445 02/03/12 2158  HGB -- 8.0* 8.1* -- -- -- -- --  HCT -- 25.9* 25.6* 26.1* -- -- -- --  PLT -- 197 -- 181 -- 161 -- --  APTT -- -- -- -- -- -- -- --  LABPROT -- 18.5* -- 25.2* 26.3* -- -- --  INR -- 1.59* -- 2.42* 2.56* -- -- --  HEPARINUNFRC 0.96* 0.93* 0.44 -- -- -- -- --  CREATININE -- 1.34 -- 1.25 -- 1.18 -- --  CKTOTAL -- -- -- -- -- -- -- --  CKMB -- -- -- -- -- -- -- --  TROPONINI -- -- -- -- -- -- <0.30 <0.30    Estimated Creatinine Clearance: 37.7 ml/min (by C-G formula based on Cr of 1.34).   Medications:  Scheduled:    . [COMPLETED] ferric gluconate (FERRLECIT/NULECIT) IV  125 mg Intravenous Once  . ferrous sulfate  325 mg Oral TID WC  . furosemide  40 mg Intravenous BID  . metoprolol succinate  50 mg Oral Daily  . pantoprazole  40 mg Oral QAC lunch  . peg 3350 powder  1 kit Oral Once  . potassium chloride  40 mEq Oral BID  . simvastatin  10 mg Oral q1800  . sodium chloride  3 mL Intravenous Q12H  . sodium chloride  3 mL Intravenous Q12H  . [DISCONTINUED] ferrous sulfate  325 mg Oral BID WC   Infusions:    . sodium chloride 10 mL/hr at 02/06/12 1215  . heparin 900 Units/hr (02/06/12 1610)    Assessment: 78 yo male with chronic afib and st Jude MV is currently on supratherapeutic heparin.  Heparin level 0.96.  Confirmed that patient is currently on heparin 900 units/hr Goal of Therapy:  Heparin level 0.3-0.7  units/ml Monitor platelets by anticoagulation protocol: Yes   Plan:  1) Reduce heparin to 750 units/hr 2) Heparin level in 8 hr  Eloisa Chokshi, Tsz-Yin 02/06/2012,3:14 PM

## 2012-02-06 NOTE — Progress Notes (Signed)
NT reported BP=80/44. Pt asymptomatic. Pt manual BP=128/58 confirmed twice manually.  Will con't to monitor and will notify MD of any changes.

## 2012-02-06 NOTE — Progress Notes (Signed)
ANTICOAGULATION CONSULT NOTE - Follow Up Consult  Pharmacy Consult for heparin when INR<2.5 Indication: chronic afib, st. Jude mitral valve No Known Allergies  Patient Measurements: Height: 5\' 7"  (170.2 cm) Weight: 158 lb 15.2 oz (72.1 kg) IBW/kg (Calculated) : 66.1  Heparin Dosing Weight: 75kg  Vital Signs: Temp: 98.6 F (37 C) (11/30 0424) Temp src: Oral (11/30 0424) BP: 125/52 mmHg (11/30 0424) Pulse Rate: 66  (11/30 0424)  Labs:  Basename 02/06/12 0515 02/05/12 1030 02/05/12 0310 02/04/12 1552 02/04/12 0455 02/04/12 0445 02/03/12 2158 02/03/12 1400 02/03/12 1126  HGB 8.0* 8.1* -- -- -- -- -- -- --  HCT 25.9* 25.6* 26.1* -- -- -- -- -- --  PLT 197 -- 181 -- 161 -- -- -- --  APTT -- -- -- -- -- -- -- 46* --  LABPROT 18.5* -- 25.2* 26.3* -- -- -- -- --  INR 1.59* -- 2.42* 2.56* -- -- -- -- --  HEPARINUNFRC 0.93* 0.44 0.40 -- -- -- -- -- --  CREATININE -- -- 1.25 -- 1.18 -- -- -- 1.40*  CKTOTAL -- -- -- -- -- -- -- -- --  CKMB -- -- -- -- -- -- -- -- --  TROPONINI -- -- -- -- -- <0.30 <0.30 -- --    Estimated Creatinine Clearance: 40.4 ml/min (by C-G formula based on Cr of 1.25).   Medications:  Warfarin Home dose 2.5 qday X 5 mg TTSun last dose 11/26  Assessment: 78 year old male with chronic afib and st jude mitral valve on warfarin prior to admit. Patient admitted with dyspnea on exertion and found to have FOBT+ in ED. On IV heparin and holding warfarin for colonoscopy/endocopy.   Heparin level (0.93) is above-goal on 1050 units/hr. Confirmed with RN that heparin level was drawn appropriately.   Goal of Therapy:  INR 2.5-3.5 Heparin level 0.3-0.7 units/ml Monitor platelets by anticoagulation protocol: Yes   Plan:  1. Decrease IV heparin to 900 units/hr.  2. Heparin level in 8 hours.   Lorre Munroe 02/06/2012 6:39 AM

## 2012-02-06 NOTE — Progress Notes (Signed)
[facility] PROGRESS NOTE  Alfred Dennis:[id number] DOB: Nov 04, 1923 DOA: 02/03/2012 PCP: No primary provider on file.  Assessment/Plan: Acute on chronic systolic CHF  -Last ejection fraction 40%  -Continue furosemide IV 40 mg twice a day  -Appreciate cardiology recommendations  -Continue Toprol-XL  -Daily weights, strict I.'s and O.'s  -neg 5196cc/6.5kg for the admission  -TSH 3.922  Blood loss Anemia  -Transfused 2 units PRBCs 11/27--hemoglobin remained stable -appreciate GI followup  -Hold warfarin  -With high risk of thrombosis due to St. Jude MVR, continue heparin gtt  Chronic atrial fibrillation/history of St. Jude mitral valve replacement  -Continue metoprolol succinate-dose increased--discussed with Dr. Anne Fu  -Rate controlled currently  -Anticoagulation as discussed above  Microcytic anemia  -Give 1 dose ferric gluconate IV, start ferrous sulfate 325mg  3 times a day  -Appreciate GI recommendations  -Endoscopy planned on Monday if INR is less than 1.5  Venous stasis dermatitis-legs  -No need for antibiotics for his legs     Family Communication:   Discussed with daughter Disposition Plan:   Home when medically stable     Procedures/Studies: Dg Chest 2 View  02/03/2012  *RADIOLOGY REPORT*  Clinical Data: Short of breath.  Weakness.  CHEST - 2 VIEW  Comparison: None.  Findings: Cardiomegaly.  Pulmonary vascular congestion.  Elevation of the left hemidiaphragm.  Left basilar atelectasis.  No focal consolidation.  Mitral valve replacement.  Interstitial pulmonary edema is present.  IMPRESSION: Cardiomegaly, pulmonary vascular congestion and interstitial pulmonary edema compatible with mild CHF.   Original Report Authenticated By: Andreas Newport, M.D.          Subjective: Patient is feeling better. He is having less shortness of breath. He denies any fevers, chills, chest pain, shortness breath, nausea, vomiting, diarrhea, abdominal pain, dysuria,  rashes.  Objective: Filed Vitals:   02/05/12 1427 02/05/12 2024 02/06/12 0424 02/06/12 1027  BP: 102/48 109/46 125/52 125/57  Pulse: 67 73 66 78  Temp: 97.6 F (36.4 C) 97.8 F (36.6 C) 98.6 F (37 C)   TempSrc: Oral Oral Oral   Resp: 19 18 18 18   Height:      Weight:   72.1 kg (158 lb 15.2 oz)   SpO2: 98% 96% 96% 100%    Intake/Output Summary (Last 24 hours) at 02/06/12 1137 Last data filed at 02/06/12 0900  Gross per 24 hour  Intake  799.6 ml  Output   3275 ml  Net -2475.4 ml   Weight change: -1.1 kg (-2 lb 6.8 oz) Exam:   General:  Pt is alert, follows commands appropriately, not in acute distress  HEENT: No icterus, No thrush, [place]/AT  Cardiovascular: IRRR, S1/S2, no rubs  Respiratory: Bibasilar crackles. No wheezes or rhonchi. Good air movement.  Abdomen: Soft/+BS, non tender, non distended, no guarding  Extremities: 2+ edema, No lymphangitis, No petechiae, No rashes, no synovitis  Data Reviewed: Basic Metabolic Panel:  Lab 02/06/12 8119 02/05/12 0310 02/04/12 0455 02/03/12 1126  NA 140 139 140 138  K 3.5 3.5 4.3 4.7  CL 106 104 108 103  CO2 28 26 25  --  GLUCOSE 92 114* 92 110*  BUN 23 22 20  34*  CREATININE 1.34 1.25 1.18 1.40*  CALCIUM 8.6 8.7 9.1 --  MG -- -- -- --  PHOS -- -- -- --   Liver Function Tests: No results found for this basename: AST:5,ALT:5,ALKPHOS:5,BILITOT:5,PROT:5,ALBUMIN:5 in the last 168 hours No results found for this basename: LIPASE:5,AMYLASE:5 in the last 168 hours No results found for  this basename: AMMONIA:5 in the last 168 hours CBC:  Lab 02/06/12 0515 02/05/12 1030 02/05/12 0310 02/04/12 2146 02/04/12 1552 02/04/12 0455 02/03/12 1113  WBC 5.3 -- 6.9 -- -- 5.7 7.8  NEUTROABS -- -- -- -- -- -- 4.8  HGB 8.0* 8.1* 8.3* 8.6* 8.4* -- --  HCT 25.9* 25.6* 26.1* 26.7* 26.9* -- --  MCV 74.6* -- 73.3* -- -- 73.7* 71.7*  PLT 197 -- 181 -- -- 161 268   Cardiac Enzymes:  Lab 02/04/12 0445 02/03/12 2158  CKTOTAL -- --  CKMB --  --  CKMBINDEX -- --  TROPONINI <0.30 <0.30   BNP: No components found with this basename: POCBNP:5 CBG: No results found for this basename: GLUCAP:5 in the last 168 hours  No results found for this or any previous visit (from the past 240 hour(s)).   Scheduled Meds:   . [COMPLETED] ferric gluconate (FERRLECIT/NULECIT) IV  125 mg Intravenous Once  . ferrous sulfate  325 mg Oral TID WC  . furosemide  40 mg Intravenous BID  . metoprolol succinate  50 mg Oral Daily  . pantoprazole  40 mg Oral QAC lunch  . potassium chloride  40 mEq Oral BID  . simvastatin  10 mg Oral q1800  . sodium chloride  3 mL Intravenous Q12H  . sodium chloride  3 mL Intravenous Q12H  . [DISCONTINUED] ferrous sulfate  325 mg Oral BID WC   Continuous Infusions:   . heparin 900 Units/hr (02/06/12 0641)     Sindee Stucker, DO  [facility] Pager [phone]  If 7PM-7AM, please contact night-coverage [email or web address] Password [email or web address] 02/06/2012, 11:37 AM   LOS: 3 days

## 2012-02-06 NOTE — Progress Notes (Signed)
Subjective:  No complaints, sitting, eating breakfast. No CP, no SOB  Objective:  Vital Signs in the last 24 hours: Temp:  [97.6 F (36.4 C)-98.6 F (37 C)] 98.6 F (37 C) (11/30 0424) Pulse Rate:  [66-88] 66  (11/30 0424) Resp:  [17-19] 18  (11/30 0424) BP: (102-125)/(46-52) 125/52 mmHg (11/30 0424) SpO2:  [96 %-98 %] 96 % (11/30 0424) Weight:  [72.1 kg (158 lb 15.2 oz)] 72.1 kg (158 lb 15.2 oz) (11/30 0424)  Intake/Output from previous day: 11/29 0701 - 11/30 0700 In: 903.6 [P.O.:680; I.V.:215.6; IV Piggyback:8] Out: 3030 [Urine:3030]   Physical Exam: General: Well developed, well nourished, in no acute distress.  Head: Normocephalic and atraumatic.  Lungs:Mild decrease at bases. No wheeze  Heart: Irreg Irreg, S1 click. No murmur, rubs or gallops.  Abdomen: soft, non-tender, positive bowel sounds.  Extremities: No clubbing or cyanosis.3+ chronic appearing LE edema. Slightly improved today, slightly reddened.  Neurologic: Alert and oriented x 3.    Lab Results:  Basename 02/06/12 0515 02/05/12 1030 02/05/12 0310  WBC 5.3 -- 6.9  HGB 8.0* 8.1* --  PLT 197 -- 181    Basename 02/06/12 0515 02/05/12 0310  NA 140 139  K 3.5 3.5  CL 106 104  CO2 28 26  GLUCOSE 92 114*  BUN 23 22  CREATININE 1.34 1.25    Basename 02/04/12 0445 02/03/12 2158  TROPONINI <0.30 <0.30   Telemetry: AFIB. 5 beats of NSVT 1 am. Asymptomatic Personally viewed.    Cardiac Studies:  EF 40% in 2008  Assessment/Plan:  Principal Problem:  *CHF (congestive heart failure) Active Problems:  Chronic a-fib  ARF (acute renal failure)  HTN (hypertension)  S/P MVR (mitral valve replacement)  GIB (gastrointestinal bleeding)  Iron deficiency anemia  Acute on chronic systolic CHF (congestive heart failure)   78 year old male who was admitted with anemia, status post transfusion, presumed GI bleed with mechanical St. Jude mitral valve, chronic anticoagulation, hypertension, persistent chronic  atrial fibrillation fairly well rate controlled with chronic lower extremity edema consistent with acute on chronic diastolic/systolic heart failure, past EF 40%  1. Mechanical mitral valve-heparin IV. INR less than 2.5. Resume Coumadin when comfortable by GI perspective.   2. Atrial fibrillation-persistent. Increased his metoprolol from 25 mg a day to 50 mg a day on 11/29. Hopefully this will help. Overall he is fairly well rate controlled.   3. Acute on chronic diastolic/systolic heart failure-prior ejection fraction in the 40% range. Still has lower extremity edema although improved. Creatinine is slightly increased to 1.34 from 1.25 today (when he came in 1.4). I would continue with 40 mg IV twice a day Lasix. Potassium supplementation as needed. Perhaps change to oral tomorrow depending on creat.   4. Anemia/GI bleed-per primary team/GI. Awaiting colonoscopy/EGD. Warfarin is currently being held. INR would need to be optimized to less than 1.5. I have reviewed gastroenterology note.  5. Non sustained VT - brief. Metoprolol. Asymptomatic. Keep K > 4.   6. Hypokalemia - K 3.5. Replete. Gave him of KCL BID for 2 days.   SKAINS, MARK 02/06/2012, 7:42 AM

## 2012-02-06 NOTE — Progress Notes (Signed)
  Echocardiogram 2D Echocardiogram has been performed.  Alfred Dennis 02/06/2012, 9:37 AM

## 2012-02-07 LAB — HEPARIN LEVEL (UNFRACTIONATED)
Heparin Unfractionated: 0.59 IU/mL (ref 0.30–0.70)
Heparin Unfractionated: 0.6 [IU]/mL (ref 0.30–0.70)

## 2012-02-07 LAB — PROTIME-INR
INR: 1.31 (ref 0.00–1.49)
Prothrombin Time: 16 seconds — ABNORMAL HIGH (ref 11.6–15.2)

## 2012-02-07 LAB — CBC
MCH: 22.5 pg — ABNORMAL LOW (ref 26.0–34.0)
Platelets: 211 10*3/uL (ref 150–400)
RBC: 3.55 MIL/uL — ABNORMAL LOW (ref 4.22–5.81)

## 2012-02-07 MED ORDER — FUROSEMIDE 40 MG PO TABS
40.0000 mg | ORAL_TABLET | Freq: Every day | ORAL | Status: DC
  Administered 2012-02-08 – 2012-02-11 (×3): 40 mg via ORAL
  Filled 2012-02-07 (×6): qty 1

## 2012-02-07 NOTE — Progress Notes (Signed)
[facility] PROGRESS NOTE  Alfred Dennis KGM:[id number] DOB: Jan 03, 1924 DOA: 02/03/2012 PCP: No primary provider on file.  Assessment/Plan: Acute on chronic systolic CHF  -EF-25-30 -Furosemide changed to po -Appreciate cardiology recommendations  -Continue Toprol-XL  -Daily weights, strict I.'s and O.'s  -neg 8441cc/5.0kg for the admission  -TSH 3.922  NSVTach -due to cardiomyopathy -continue metoprolol Blood loss Anemia  -Transfused 2 units PRBCs 11/27--hemoglobin remained stable  -appreciate GI followup  -Hold warfarin  -With high risk of thrombosis due to St. Jude MVR, continue heparin gtt  Chronic atrial fibrillation/history of St. Jude mitral valve replacement  -Continue metoprolol succinate-dose increased--discussed with Dr. Anne Fu  -Rate controlled currently  -Anticoagulation as discussed above  Microcytic anemia  -Give 1 dose ferric gluconate IV, start ferrous sulfate 325mg  3 times a day  -Appreciate GI recommendations  -Endoscopy planned on Monday  Venous stasis dermatitis-legs  -No need for antibiotics for his legs     Family Communication:   Daughter at beside      Procedures/Studies: Dg Chest 2 View  02/03/2012  *RADIOLOGY REPORT*  Clinical Data: Short of breath.  Weakness.  CHEST - 2 VIEW  Comparison: None.  Findings: Cardiomegaly.  Pulmonary vascular congestion.  Elevation of the left hemidiaphragm.  Left basilar atelectasis.  No focal consolidation.  Mitral valve replacement.  Interstitial pulmonary edema is present.  IMPRESSION: Cardiomegaly, pulmonary vascular congestion and interstitial pulmonary edema compatible with mild CHF.   Original Report Authenticated By: Andreas Newport, M.D.          Subjective: Patient feels that his breathing is back to normal. He denies any chest pain, shortness of breath, nausea, vomiting, diarrhea, abdominal pain. No fevers or chills. No dysuria. No hematuria. Denies any hematochezia or  melena.  Objective: Filed Vitals:   02/06/12 1405 02/06/12 2007 02/07/12 0430 02/07/12 1033  BP: 128/58 112/44 90/58 113/69  Pulse:  76 70 83  Temp:  98.1 F (36.7 C) 97.2 F (36.2 C) 98.6 F (37 C)  TempSrc:  Oral Oral Oral  Resp:  18 18 18   Height:      Weight:   70.2 kg (154 lb 12.2 oz)   SpO2:  98% 98% 97%    Intake/Output Summary (Last 24 hours) at 02/07/12 1300 Last data filed at 02/07/12 1200  Gross per 24 hour  Intake   1010 ml  Output   4175 ml  Net  -3165 ml   Weight change: -1.9 kg (-4 lb 3 oz) Exam:   General:  Pt is alert, follows commands appropriately, not in acute distress  HEENT: No icterus, No thrush,  [place]/AT  Cardiovascular: IRRR no rubs,   Respiratory: Left greater than the right crackles. No wheezes or rhonchi. Good air movement.  Abdomen: Soft/+BS, non tender, non distended, no guarding  Extremities: 2+ edema, No lymphangitis, No petechiae, No rashes, no synovitis  Data Reviewed: Basic Metabolic Panel:  Lab 02/06/12 6440 02/05/12 0310 02/04/12 0455 02/03/12 1126  NA 140 139 140 138  K 3.5 3.5 4.3 4.7  CL 106 104 108 103  CO2 28 26 25  --  GLUCOSE 92 114* 92 110*  BUN 23 22 20  34*  CREATININE 1.34 1.25 1.18 1.40*  CALCIUM 8.6 8.7 9.1 --  MG -- -- -- --  PHOS -- -- -- --   Liver Function Tests: No results found for this basename: AST:5,ALT:5,ALKPHOS:5,BILITOT:5,PROT:5,ALBUMIN:5 in the last 168 hours No results found for this basename: LIPASE:5,AMYLASE:5 in the last 168 hours No results found for  this basename: AMMONIA:5 in the last 168 hours CBC:  Lab 02/07/12 0545 02/06/12 0515 02/05/12 1030 02/05/12 0310 02/04/12 2146 02/04/12 0455 02/03/12 1113  WBC 5.9 5.3 -- 6.9 -- 5.7 7.8  NEUTROABS -- -- -- -- -- -- 4.8  HGB 8.0* 8.0* 8.1* 8.3* 8.6* -- --  HCT 26.4* 25.9* 25.6* 26.1* 26.7* -- --  MCV 74.4* 74.6* -- 73.3* -- 73.7* 71.7*  PLT 211 197 -- 181 -- 161 268   Cardiac Enzymes:  Lab 02/04/12 0445 02/03/12 2158  CKTOTAL -- --   CKMB -- --  CKMBINDEX -- --  TROPONINI <0.30 <0.30   BNP: No components found with this basename: POCBNP:5 CBG: No results found for this basename: GLUCAP:5 in the last 168 hours  No results found for this or any previous visit (from the past 240 hour(s)).   Scheduled Meds:   . ferrous sulfate  325 mg Oral TID WC  . furosemide  40 mg Oral Daily  . metoprolol succinate  50 mg Oral Daily  . pantoprazole  40 mg Oral QAC lunch  . peg 3350 powder  1 kit Oral Once  . potassium chloride  40 mEq Oral BID  . simvastatin  10 mg Oral q1800  . sodium chloride  3 mL Intravenous Q12H  . sodium chloride  3 mL Intravenous Q12H  . [DISCONTINUED] furosemide  40 mg Intravenous BID   Continuous Infusions:   . sodium chloride 10 mL/hr at 02/06/12 1215  . heparin 750 Units/hr (02/07/12 0305)     Jenella Craigie, DO  [facility] Pager [phone]  If 7PM-7AM, please contact night-coverage [email or web address] Password [email or web address] 02/07/2012, 1:00 PM   LOS: 4 days

## 2012-02-07 NOTE — Progress Notes (Signed)
Subjective:  Laying flat, feeling well. No bleeding. No SOB, no CP.   Objective:  Vital Signs in the last 24 hours: Temp:  [97.2 F (36.2 C)-98.3 F (36.8 C)] 97.2 F (36.2 C) (12/01 0430) Pulse Rate:  [61-78] 70  (12/01 0430) Resp:  [17-18] 18  (12/01 0430) BP: (80-128)/(44-58) 90/58 mmHg (12/01 0430) SpO2:  [98 %-100 %] 98 % (12/01 0430) Weight:  [70.2 kg (154 lb 12.2 oz)] 70.2 kg (154 lb 12.2 oz) (12/01 0430)  Intake/Output from previous day: 11/30 0701 - 12/01 0700 In: 950 [P.O.:860; I.V.:90] Out: 3500 [Urine:3500]   Physical Exam: General: Well developed, well nourished, in no acute distress.  Head: Normocephalic and atraumatic.  Lungs:Mild decrease at bases. No wheeze  Heart: Irreg Irreg, S1 click. No murmur, rubs or gallops.  Abdomen: soft, non-tender, positive bowel sounds.  Extremities: No clubbing or cyanosis.1+ chronic appearing LE edema. Slightly improved today, slightly reddened.  Neurologic: Alert and oriented x 3.      Lab Results:  Basename 02/07/12 0545 02/06/12 0515  WBC 5.9 5.3  HGB 8.0* 8.0*  PLT 211 197    Basename 02/06/12 0515 02/05/12 0310  NA 140 139  K 3.5 3.5  CL 106 104  CO2 28 26  GLUCOSE 92 114*  BUN 23 22  CREATININE 1.34 1.25   Telemetry: no NSVT. AFIB rate contr.  Personally viewed.   Cardiac Studies:  ECHO EF 25%  Assessment/Plan:  Principal Problem:  *CHF (congestive heart failure) Active Problems:  Chronic a-fib  ARF (acute renal failure)  HTN (hypertension)  S/P MVR (mitral valve replacement)  GIB (gastrointestinal bleeding)  Iron deficiency anemia  Acute on chronic systolic CHF (congestive heart failure)  Non-sustained ventricular tachycardia   78 year old male who was admitted with anemia, status post transfusion, presumed GI bleed with mechanical St. Jude mitral valve, chronic anticoagulation, hypertension, persistent chronic atrial fibrillation fairly well rate controlled with chronic lower extremity edema  consistent with acute on chronic diastolic/systolic heart failure, past EF 40% now 20-25%   1. Mechanical mitral valve-heparin IV. INR less than 2.5. Resume Coumadin when comfortable by GI perspective.   2. Atrial fibrillation-persistent. Increased his metoprolol from 25 mg a day to 50 mg a day on 11/29. Helping. Overall he is fairly well rate controlled.   3. Acute on chronic diastolic/systolic heart failure-prior ejection fraction in the 40% range now 20-25%. Has decreased lower extremity edema. Creatinine is slightly increased to 1.34 from 1.25  (when he came in 1.4). I will decrease to 40 PO once a day Lasix. Hypotension as well. Potassium supplementation as needed.  4. Anemia/GI bleed-per primary team/GI. Awaiting colonoscopy/EGD. Warfarin is currently being held. INR now ess than 1.5. I have reviewed gastroenterology note.   5. Non sustained VT - brief. Metoprolol. Asymptomatic. Keep K > 4. This is expected with EF 20-25%. Would not be candidate for ICD due to age.    6. Hypokalemia - K 3.5. Replete. Gave him of KCL BID for 2 days.      SKAINS, MARK 02/07/2012, 8:22 AM

## 2012-02-07 NOTE — Progress Notes (Signed)
ANTICOAGULATION CONSULT NOTE - Follow Up Consult  Pharmacy Consult for heparin Indication: chronic afib, st Jude mitral valve  No Known Allergies  Labs:  Basename 02/07/12 0545 02/07/12 0129 02/06/12 1428 02/06/12 0515 02/05/12 1030 02/05/12 0310  HGB 8.0* -- -- 8.0* -- --  HCT 26.4* -- -- 25.9* 25.6* --  PLT 211 -- -- 197 -- 181  APTT -- -- -- -- -- --  LABPROT 16.0* -- -- 18.5* -- 25.2*  INR 1.31 -- -- 1.59* -- 2.42*  HEPARINUNFRC 0.60 0.59 0.96* -- -- --  CREATININE -- -- -- 1.34 -- 1.25  CKTOTAL -- -- -- -- -- --  CKMB -- -- -- -- -- --  TROPONINI -- -- -- -- -- --    Estimated Creatinine Clearance: 37.7 ml/min (by C-G formula based on Cr of 1.34).  Assessment: 78 yo male with chronic afib and St Jude MVR for Heparin Heparin level therapeutic  Goal of Therapy:  Heparin level 0.3-0.7 units/ml Monitor platelets by anticoagulation protocol: Yes   Plan:  Continue Heparin at 750 units / hr Plan for EGD tomorrow  Thank you. Okey Regal, PharmD [phone]  02/07/2012,10:02 AM

## 2012-02-07 NOTE — Progress Notes (Signed)
ANTICOAGULATION CONSULT NOTE - Follow Up Consult  Pharmacy Consult for heparin Indication: chronic afib, st Jude mitral valve  No Known Allergies  Patient Measurements: Height: 5\' 7"  (170.2 cm) Weight: 158 lb 15.2 oz (72.1 kg) IBW/kg (Calculated) : 66.1  Heparin Dosing Weight: 75 kg  Vital Signs: Temp: 98.1 F (36.7 C) (11/30 2007) Temp src: Oral (11/30 2007) BP: 112/44 mmHg (11/30 2007) Pulse Rate: 76  (11/30 2007)  Labs:  Basename 02/07/12 0129 02/06/12 1428 02/06/12 0515 02/05/12 1030 02/05/12 0310 02/04/12 1552 02/04/12 0455 02/04/12 0445  HGB -- -- 8.0* 8.1* -- -- -- --  HCT -- -- 25.9* 25.6* 26.1* -- -- --  PLT -- -- 197 -- 181 -- 161 --  APTT -- -- -- -- -- -- -- --  LABPROT -- -- 18.5* -- 25.2* 26.3* -- --  INR -- -- 1.59* -- 2.42* 2.56* -- --  HEPARINUNFRC 0.59 0.96* 0.93* -- -- -- -- --  CREATININE -- -- 1.34 -- 1.25 -- 1.18 --  CKTOTAL -- -- -- -- -- -- -- --  CKMB -- -- -- -- -- -- -- --  TROPONINI -- -- -- -- -- -- -- <0.30    Estimated Creatinine Clearance: 37.7 ml/min (by C-G formula based on Cr of 1.34).  Assessment: 78 yo male with chronic afib and St Jude MVR for Heparin  Goal of Therapy:  Heparin level 0.3-0.7 units/ml Monitor platelets by anticoagulation protocol: Yes   Plan:  Continue Heparin at current rate  Almer Bushey, [employer] 02/07/2012,2:42 AM

## 2012-02-08 ENCOUNTER — Encounter (HOSPITAL_COMMUNITY): Payer: Self-pay | Admitting: Gastroenterology

## 2012-02-08 ENCOUNTER — Encounter (HOSPITAL_COMMUNITY)
Admission: EM | Disposition: A | Discharge status: Home Health | Payer: Self-pay | Source: Home / Self Care | Attending: Internal Medicine

## 2012-02-08 DIAGNOSIS — C189 Malignant neoplasm of colon, unspecified: Secondary | ICD-10-CM | POA: Diagnosis present

## 2012-02-08 DIAGNOSIS — D126 Benign neoplasm of colon, unspecified: Secondary | ICD-10-CM

## 2012-02-08 DIAGNOSIS — D490 Neoplasm of unspecified behavior of digestive system: Secondary | ICD-10-CM

## 2012-02-08 DIAGNOSIS — K2970 Gastritis, unspecified, without bleeding: Secondary | ICD-10-CM

## 2012-02-08 DIAGNOSIS — K2990 Gastroduodenitis, unspecified, without bleeding: Secondary | ICD-10-CM

## 2012-02-08 DIAGNOSIS — K6389 Other specified diseases of intestine: Secondary | ICD-10-CM

## 2012-02-08 HISTORY — PX: COLONOSCOPY: SHX5424

## 2012-02-08 HISTORY — PX: ESOPHAGOGASTRODUODENOSCOPY: SHX5428

## 2012-02-08 LAB — CBC
HCT: 29.4 % — ABNORMAL LOW (ref 39.0–52.0)
Hemoglobin: 8.9 g/dL — ABNORMAL LOW (ref 13.0–17.0)
MCH: 22.8 pg — ABNORMAL LOW (ref 26.0–34.0)
MCHC: 30.3 g/dL (ref 30.0–36.0)
MCV: 75.2 fL — ABNORMAL LOW (ref 78.0–100.0)
Platelets: 225 K/uL (ref 150–400)
RBC: 3.91 MIL/uL — ABNORMAL LOW (ref 4.22–5.81)
RDW: 20.2 % — ABNORMAL HIGH (ref 11.5–15.5)
WBC: 5.4 K/uL (ref 4.0–10.5)

## 2012-02-08 LAB — BASIC METABOLIC PANEL
CO2: 25 mEq/L (ref 19–32)
Calcium: 9.3 mg/dL (ref 8.4–10.5)
Creatinine, Ser: 1.2 mg/dL (ref 0.50–1.35)
GFR calc non Af Amer: 53 mL/min — ABNORMAL LOW (ref >90)
Glucose, Bld: 95 mg/dL (ref 70–99)

## 2012-02-08 LAB — HEPARIN LEVEL (UNFRACTIONATED): Heparin Unfractionated: 0.46 IU/mL (ref 0.30–0.70)

## 2012-02-08 LAB — PROTIME-INR: Prothrombin Time: 16.1 seconds — ABNORMAL HIGH (ref 11.6–15.2)

## 2012-02-08 LAB — GLUCOSE, CAPILLARY: Glucose-Capillary: 116 mg/dL — ABNORMAL HIGH (ref 70–99)

## 2012-02-08 SURGERY — EGD (ESOPHAGOGASTRODUODENOSCOPY)
Anesthesia: Moderate Sedation

## 2012-02-08 MED ORDER — BUTAMBEN-TETRACAINE-BENZOCAINE 2-2-14 % EX AERO
INHALATION_SPRAY | CUTANEOUS | Status: DC | PRN
  Administered 2012-02-08: 2 via TOPICAL

## 2012-02-08 MED ORDER — SODIUM CHLORIDE 0.9 % IV SOLN
INTRAVENOUS | Status: DC
  Administered 2012-02-08: 500 mL via INTRAVENOUS

## 2012-02-08 MED ORDER — HEPARIN (PORCINE) IN NACL 100-0.45 UNIT/ML-% IJ SOLN
900.0000 [IU]/h | INTRAMUSCULAR | Status: DC
  Administered 2012-02-08 (×2): 750 [IU]/h via INTRAVENOUS
  Administered 2012-02-09 (×2): 900 [IU]/h via INTRAVENOUS
  Filled 2012-02-08 (×3): qty 250

## 2012-02-08 MED ORDER — MIDAZOLAM HCL 5 MG/5ML IJ SOLN
INTRAMUSCULAR | Status: DC | PRN
  Administered 2012-02-08 (×3): 1 mg via INTRAVENOUS

## 2012-02-08 MED ORDER — MIDAZOLAM HCL 10 MG/2ML IJ SOLN: INTRAMUSCULAR | Status: DC | PRN

## 2012-02-08 MED ORDER — FENTANYL CITRATE 0.05 MG/ML IJ SOLN
INTRAMUSCULAR | Status: DC | PRN
  Administered 2012-02-08 (×3): 12.5 ug via INTRAVENOUS

## 2012-02-08 MED ORDER — FENTANYL CITRATE 0.05 MG/ML IJ SOLN
INTRAMUSCULAR | Status: AC
  Filled 2012-02-08: qty 4

## 2012-02-08 MED ORDER — DIPHENHYDRAMINE HCL 50 MG/ML IJ SOLN
INTRAMUSCULAR | Status: AC
  Filled 2012-02-08: qty 1

## 2012-02-08 MED ORDER — MIDAZOLAM HCL 5 MG/ML IJ SOLN
INTRAMUSCULAR | Status: AC
  Filled 2012-02-08: qty 4

## 2012-02-08 NOTE — Progress Notes (Deleted)
Pt d/c to home with wife.

## 2012-02-08 NOTE — Progress Notes (Signed)
[facility] PROGRESS NOTE  MAXTEN SHULER BJY:[id number] DOB: April 12, 1923 DOA: 02/03/2012 PCP: No primary provider on file.  Assessment/Plan: Acute on chronic systolic CHF  -EF-25-30  -Furosemide changed to po--continue -Appreciate cardiology recommendations  -Continue Toprol-XL  -Daily weights, strict I.'s and O.'s  -neg 7706/6.7kg for the admission  -TSH 3.922  Transverse colon mass -Noted on colonoscopy -Gen. surgery consulted today NSVTach  -no further epsiodes with increase of metoprolol -due to cardiomyopathy  -continue metoprolol  Blood loss Anemia/Iron deficiency -Transfused 2 units PRBCs 11/27--hemoglobin remained stable  -appreciate GI followup  -Hold warfarin  -With high risk of thrombosis due to St. Jude MVR, continue heparin gtt with anticipated surgery for colon mass -continue iron supplementation Chronic atrial fibrillation/history of St. Jude mitral valve replacement  -Continue metoprolol succinate-dose increased -Rate controlled currently  -Anticoagulation as discussed above  Venous stasis dermatitis-legs  -No need for antibiotics for his legs      Family Communication:   Family at beside Disposition Plan:   Home when medically stable   Procedures:  Colonoscopy and EGD 02/08/2012>>>>     Procedures/Studies: Dg Chest 2 View  02/03/2012  *RADIOLOGY REPORT*  Clinical Data: Short of breath.  Weakness.  CHEST - 2 VIEW  Comparison: None.  Findings: Cardiomegaly.  Pulmonary vascular congestion.  Elevation of the left hemidiaphragm.  Left basilar atelectasis.  No focal consolidation.  Mitral valve replacement.  Interstitial pulmonary edema is present.  IMPRESSION: Cardiomegaly, pulmonary vascular congestion and interstitial pulmonary edema compatible with mild CHF.   Original Report Authenticated By: Andreas Newport, M.D.          Subjective: Patient complains of mild sore throat. He denies any fevers, chills, chest pain, shortness of  breath, nausea, vomiting, diarrhea. Denies any rashes, dysuria.  Objective: Filed Vitals:   02/08/12 1423 02/08/12 1430 02/08/12 1440 02/08/12 1450  BP: 113/55 113/55 119/44 145/113  Pulse:      Temp:      TempSrc:      Resp: 15 20 15 16   Height:      Weight:      SpO2: 100% 100% 100%     Intake/Output Summary (Last 24 hours) at 02/08/12 1605 Last data filed at 02/08/12 1500  Gross per 24 hour  Intake 2176.63 ml  Output   1232 ml  Net 944.63 ml   Weight change: -1.707 kg (-3 lb 12.2 oz) Exam:   General:  Pt is alert, follows commands appropriately, not in acute distress  HEENT: No icterus, No thrush [place]/AT  Cardiovascular: RRR, no rubs, no gallops  Respiratory: Bibasilar crackles, left greater than right. No wheezes or rhonchi. Good air movement.  Heart auscultation. No wheezes or rhonchi.  Abdomen: Soft/+BS, non tender, non distended, no guarding  Extremities: 2+ edema, No lymphangitis, No petechiae, No rashes, no synovitis  Data Reviewed: Basic Metabolic Panel:  Lab 02/08/12 0865 02/06/12 0515 02/05/12 0310 02/04/12 0455 02/03/12 1126  NA 142 140 139 140 138  K 4.5 3.5 3.5 4.3 4.7  CL 108 106 104 108 103  CO2 25 28 26 25  --  GLUCOSE 95 92 114* 92 110*  BUN 23 23 22 20  34*  CREATININE 1.20 1.34 1.25 1.18 1.40*  CALCIUM 9.3 8.6 8.7 9.1 --  MG -- -- -- -- --  PHOS -- -- -- -- --   Liver Function Tests: No results found for this basename: AST:5,ALT:5,ALKPHOS:5,BILITOT:5,PROT:5,ALBUMIN:5 in the last 168 hours No results found for this basename: LIPASE:5,AMYLASE:5 in the last 168 hours  No results found for this basename: AMMONIA:5 in the last 168 hours CBC:  Lab 02/08/12 0445 02/07/12 0545 02/06/12 0515 02/05/12 1030 02/05/12 0310 02/04/12 0455 02/03/12 1113  WBC 5.4 5.9 5.3 -- 6.9 5.7 --  NEUTROABS -- -- -- -- -- -- 4.8  HGB 8.9* 8.0* 8.0* 8.1* 8.3* -- --  HCT 29.4* 26.4* 25.9* 25.6* 26.1* -- --  MCV 75.2* 74.4* 74.6* -- 73.3* 73.7* --  PLT 225 211 197  -- 181 161 --   Cardiac Enzymes:  Lab 02/04/12 0445 02/03/12 2158  CKTOTAL -- --  CKMB -- --  CKMBINDEX -- --  TROPONINI <0.30 <0.30   BNP: No components found with this basename: POCBNP:5 CBG:  Lab 02/08/12 1112  GLUCAP 116*    No results found for this or any previous visit (from the past 240 hour(s)).   Scheduled Meds:   . ferrous sulfate  325 mg Oral TID WC  . furosemide  40 mg Oral Daily  . metoprolol succinate  50 mg Oral Daily  . pantoprazole  40 mg Oral QAC lunch  . [COMPLETED] peg 3350 powder  1 kit Oral Once  . [COMPLETED] potassium chloride  40 mEq Oral BID  . simvastatin  10 mg Oral q1800  . sodium chloride  3 mL Intravenous Q12H   Continuous Infusions:   . [EXPIRED] heparin Stopped (02/08/12 0715)  . heparin    . [DISCONTINUED] sodium chloride 10 mL/hr at 02/06/12 1215  . [DISCONTINUED] sodium chloride 500 mL (02/08/12 1310)     Lottie Siska, DO  [facility] Pager [phone]  If 7PM-7AM, please contact night-coverage [email or web address] Password [email or web address] 02/08/2012, 4:05 PM   LOS: 5 days

## 2012-02-08 NOTE — Op Note (Signed)
Alfred Dennis [facility] [address] [place], [address]   ENDOSCOPY PROCEDURE REPORT  PATIENT: Alfred Dennis, Alfred Dennis  MR#: [id number] BIRTHDATE: 10-20-1923 , 87  yrs. old GENDER: Male ENDOSCOPIST: Iva Boop, MD, [facility] PROCEDURE DATE:  02/08/2012 PROCEDURE:  EGD w/ biopsy ASA CLASS:     Class IV INDICATIONS:  Iron deficiency anemia.   Heme positive stool. MEDICATIONS: Fentanyl 37.5 mcg IV and Versed 3 mg IV TOPICAL ANESTHETIC: Cetacaine Spray  DESCRIPTION OF PROCEDURE: After the risks benefits and alternatives of the procedure were thoroughly explained, informed consent was obtained.  The Pentax Gastroscope [id number] endoscope was introduced through the mouth and advanced to the second portion of the duodenum. Without limitations.  The instrument was slowly withdrawn as the mucosa was fully examined.        STOMACH: Abnormal mucosa was found in the gastric antrum.  The mucosa was erythematous and had petechiae.  Multiple biopsies were performed using cold forceps.  Sample sent for histology. ? gastritis vs. vascular ectasia  The remainder of the upper endoscopy exam was otherwise normal. Retroflexed views revealed no abnormalities.     The scope was then withdrawn from the patient and the procedure completed.  COMPLICATIONS: There were no complications. ENDOSCOPIC IMPRESSION: 1.   Abnormal mucosa was found in the gastric antrum; The mucosa was erythematous and had petechiae; multiple biopsies ? gastritis vs. mild vascular ectasia 2.   The remainder of the upper endoscopy exam was otherwise normal  RECOMMENDATIONS: Proceed with a Colonoscopy.    eSigned:  Iva Boop, MD, [name] 02/08/2012 2:34 PM

## 2012-02-08 NOTE — Progress Notes (Signed)
ANTICOAGULATION CONSULT NOTE - Follow Up Consult  Pharmacy Consult for Heparin Indication: Afib + AVR  No Known Allergies  Patient Measurements: Height: 5\' 7"  (170.2 cm) Weight: 151 lb (68.493 kg) (b scale) IBW/kg (Calculated) : 66.1  Heparin Dosing Weight:    Vital Signs: Temp: 98.3 F (36.8 C) (12/02 1257) Temp src: Oral (12/02 1257) BP: 145/113 mmHg (12/02 1450) Pulse Rate: 117  (12/02 1257)  Labs:  Alvira Philips 02/08/12 0445 02/07/12 0545 02/07/12 0129 02/06/12 0515  HGB 8.9* 8.0* -- --  HCT 29.4* 26.4* -- 25.9*  PLT 225 211 -- 197  APTT -- -- -- --  LABPROT 16.1* 16.0* -- 18.5*  INR 1.32 1.31 -- 1.59*  HEPARINUNFRC 0.46 0.60 0.59 --  CREATININE 1.20 -- -- 1.34  CKTOTAL -- -- -- --  CKMB -- -- -- --  TROPONINI -- -- -- --    Estimated Creatinine Clearance: 42.1 ml/min (by C-G formula based on Cr of 1.2).   Assessment: CC/HPI: 78 year old male with chronic afib and St. Jude MVR on warfarin prior to admit. Patient admitted with dyspnea on exertion and found to have FOBT+ in ED. On IV heparin and holding warfarin for colonoscopy/endocopy.  AC: Warfarin PTA for Mechanical Mitral Valve and h/o A-fib, INR 1.32 today (going to endoscopy today). On heparin while off warfarin>> HL this AM was 0.46 on 750 units/hr. PTA Dose: 5mg  warfarin Tues/Thurs/Sunday, 2.5mg  all other days with admit INR 2.96  GI: Circumferential mass were found in the transverse colon Three sessile polyps found at the cecum.  Goal of Therapy:  Heparin level 0.3-0.7 units/ml Monitor platelets by anticoagulation protocol: Yes   Plan:  Orders to restart heparin post-colonoscopy. Resume 750 units/hr and recheck level in 6-8 hrs. No plan to resume Coumadin yet in case surgery needed.  Merilynn Finland, [employer] 02/08/2012,3:35 PM

## 2012-02-08 NOTE — Consult Note (Signed)
Agree with above.   Would plan resection unless widespread disease found, and then palliative resection for bleeding may still be indicated.  Will follow and plan resection midweek barring unforseen circumstances.

## 2012-02-08 NOTE — Op Note (Signed)
[facility] [address] [place], [address]   COLONOSCOPY PROCEDURE REPORT  PATIENT: Taim, Wurm  MR#: [id number] BIRTHDATE: November 16, 1923 , 87  yrs. old GENDER: Male ENDOSCOPIST: Iva Boop, MD, [facility] PROCEDURE DATE:  02/08/2012 PROCEDURE:   Colonoscopy with biopsy ASA CLASS:   Class IV INDICATIONS:Iron Deficiency Anemia and heme-positive stool. MEDICATIONS: There was residual sedation effect present from prior procedure.  DESCRIPTION OF PROCEDURE:   After the risks benefits and alternatives of the procedure were thoroughly explained, informed consent was obtained.  A digital rectal exam revealed no abnormalities of the rectum.   The Pentax Colonoscope [id number] endoscope was introduced through the anus and advanced to the cecum, which was identified by both the appendix and ileocecal valve. No adverse events experienced.   The quality of the prep was good, using MoviPrep  The instrument was then slowly withdrawn as the colon was fully examined.      COLON FINDINGS: A circumferential ulcerated mass with friable surfaces was found in the transverse colon.  Multiple biopsies were performed using cold forceps.   Three polypoid shaped sessile polyps measuring 0.5 - 2 cm in size were found at the cecum and in the transverse colon.  Multiple biopsies were performed using cold forceps.   The colon mucosa was otherwise normal.  Retroflexed views revealed no abnormalities. The time to cecum=minutes 0 seconds.  Withdrawal time=  .  The scope was withdrawn and the procedure completed. COMPLICATIONS: There were no complications.  ENDOSCOPIC IMPRESSION: 1.   Circumferential mass were found in the transverse colon; multiple biopsies were performed using cold forceps- looks like carcinoma and source of anemia 2.   Three sessile polyps measuring 0.5 - 2 cm in size were found at the cecum (2) - biopiseds and in the transverse colon (1- 5mm); multiple  biopsies were performed using cold forceps 3.   The colon mucosa was otherwise normal  RECOMMENDATIONS: Resume heparin but not warfarin (ordered), clear liquids Will need to discuss if surgery reasonable   eSigned:  Iva Boop, MD, [facility] 02/08/2012 2:40 PM

## 2012-02-08 NOTE — Consult Note (Signed)
Alfred Dennis 08-30-1923  [id number].   Requesting MD: Dr. Onalee Hua Tat Chief Complaint/Reason for Consult: transverse colon mass HPI: This is an 78 yo male who has been having increasing SOB over the last 3-4 weeks.  It progressed to the point he came to [facility] where he was found to be in CHF.  He was subsequently admitted for further treatment.  At the same time, he was noted to be anemic, which was exacerbating his SOB.  He was found to be heme-positive and GI was asked to see the patient.  He was set up for a colonoscopy.  He was found to have several sessile polyps in the right colon and a transverse colon mass that was ulcerative in nature and likely malignant.  The patient denies any abdominal symptoms such as pain, nausea, vomiting, change in bowel function, or hematochezia.  Denies weight loss.  Due to this finding, we were asked to see the patient for surgical evaluation.  Review of Systems: Please see HPI, otherwise all other systems are negative  History reviewed. No pertinent family history.  No family history of colon cancer  Past Medical History  Diagnosis Date  . Hypertension   . Shortness of breath     "w/any activity lately" (02/03/2012)  . CHF (congestive heart failure)     mild/note 02/03/2012  . Arthritis     "left foot; fingers" (02/03/2012)  . Dysrhythmia     Past Surgical History  Procedure Date  . Inguinal hernia repair     right  . Cataract extraction w/ intraocular lens  implant, bilateral ~ 2011  . Valve replacement     Dr. [facility]  . Cardiac valve replacement ~ 2001    "Krishna't know which one"  (02/03/2012)  . Coronary artery bypass graft     3x bypass    Social History:  reports that he quit smoking about 31 years ago. His smoking use included Cigarettes. He has a 20 pack-year smoking history. He has never used smokeless tobacco. He reports that he does not drink alcohol or use illicit drugs.  Allergies: No Known Allergies  Medications  Prior to Admission  Medication Sig Dispense Refill  . furosemide (LASIX) 20 MG tablet Take 40 mg by mouth daily.      . hydrALAZINE (APRESOLINE) 25 MG tablet Take 37.5 mg by mouth 4 (four) times daily.      . metoprolol succinate (TOPROL-XL) 50 MG 24 hr tablet Take 75 mg by mouth daily. Take with or immediately following a meal.      . Multiple Vitamin (MULTIVITAMIN WITH MINERALS) TABS Take 1 tablet by mouth daily.      Marland Kitchen omega-3 acid ethyl esters (LOVAZA) 1 G capsule Take 1 g by mouth 3 (three) times daily.      . simvastatin (ZOCOR) 10 MG tablet Take 10 mg by mouth at bedtime.      . triamcinolone cream (KENALOG) 0.1 % Apply 1 application topically 2 (two) times daily. Stop on 02/05/12      . warfarin (COUMADIN) 5 MG tablet Take 2.5-5 mg by mouth daily. Takes 2.5mg  everyday except 5mg  on Tuesday, Thursday, and Sunday        Blood pressure 145/113, pulse 117, temperature 98.3 F (36.8 C), temperature source Oral, resp. rate 16, height 5\' 7"  (1.702 m), weight 151 lb (68.493 kg), SpO2 100.00%. Physical Exam: General: pleasant, WD, WN white male who is laying in bed in NAD HEENT: head is normocephalic, atraumatic.  Sclera  are noninjected.  PERRL.  Ears and nose without any masses or lesions.  Mouth is pink and moist Heart: irregularly irregular.  Normal s1,s2. No obvious murmurs, gallops, or rubs noted.  Palpable radial and pedal pulses bilaterally Lungs: CTAB, no wheezes, rhonchi, or rales noted.  Respiratory effort nonlabored Abd: soft, NT, ND, +BS, no masses, hernias, or organomegaly MS: all 4 extremities are symmetrical with no cyanosis or clubbing.  He has peripheral edema noted in his bilateral lower extremities Skin: warm and dry with no masses, lesions, or rashes Psych: A&Ox3 with an appropriate affect.    Results for orders placed during the hospital encounter of 02/03/12 (from the past 48 hour(s))  HEPARIN LEVEL (UNFRACTIONATED)     Status: Normal   Collection Time   02/07/12   1:29 AM      Component Value Range Comment   Heparin Unfractionated 0.59  0.30 - 0.70 IU/mL   PROTIME-INR     Status: Abnormal   Collection Time   02/07/12  5:45 AM      Component Value Range Comment   Prothrombin Time 16.0 (*) 11.6 - 15.2 seconds    INR 1.31  0.00 - 1.49   CBC     Status: Abnormal   Collection Time   02/07/12  5:45 AM      Component Value Range Comment   WBC 5.9  4.0 - 10.5 K/uL    RBC 3.55 (*) 4.22 - 5.81 MIL/uL    Hemoglobin 8.0 (*) 13.0 - 17.0 g/dL    HCT 01.0 (*) 27.2 - 52.0 %    MCV 74.4 (*) 78.0 - 100.0 fL    MCH 22.5 (*) 26.0 - 34.0 pg    MCHC 30.3  30.0 - 36.0 g/dL    RDW 53.6 (*) 64.4 - 15.5 %    Platelets 211  150 - 400 K/uL   HEPARIN LEVEL (UNFRACTIONATED)     Status: Normal   Collection Time   02/07/12  5:45 AM      Component Value Range Comment   Heparin Unfractionated 0.60  0.30 - 0.70 IU/mL   PROTIME-INR     Status: Abnormal   Collection Time   02/08/12  4:45 AM      Component Value Range Comment   Prothrombin Time 16.1 (*) 11.6 - 15.2 seconds    INR 1.32  0.00 - 1.49   HEPARIN LEVEL (UNFRACTIONATED)     Status: Normal   Collection Time   02/08/12  4:45 AM      Component Value Range Comment   Heparin Unfractionated 0.46  0.30 - 0.70 IU/mL   BASIC METABOLIC PANEL     Status: Abnormal   Collection Time   02/08/12  4:45 AM      Component Value Range Comment   Sodium 142  135 - 145 mEq/L    Potassium 4.5  3.5 - 5.1 mEq/L    Chloride 108  96 - 112 mEq/L    CO2 25  19 - 32 mEq/L    Glucose, Bld 95  70 - 99 mg/dL    BUN 23  6 - 23 mg/dL    Creatinine, Ser 0.34  0.50 - 1.35 mg/dL    Calcium 9.3  8.4 - 74.2 mg/dL    GFR calc non Af Amer 53 (*) >90 mL/min    GFR calc Af Amer 62 (*) >90 mL/min   CBC     Status: Abnormal   Collection Time   02/08/12  4:45  AM      Component Value Range Comment   WBC 5.4  4.0 - 10.5 K/uL    RBC 3.91 (*) 4.22 - 5.81 MIL/uL    Hemoglobin 8.9 (*) 13.0 - 17.0 g/dL    HCT 16.1 (*) 09.6 - 52.0 %    MCV 75.2 (*) 78.0 -  100.0 fL    MCH 22.8 (*) 26.0 - 34.0 pg    MCHC 30.3  30.0 - 36.0 g/dL    RDW 04.5 (*) 40.9 - 15.5 %    Platelets 225  150 - 400 K/uL   GLUCOSE, CAPILLARY     Status: Abnormal   Collection Time   02/08/12 11:12 AM      Component Value Range Comment   Glucose-Capillary 116 (*) 70 - 99 mg/dL    No results found.     Assessment/Plan 1. Transverse colon mass, likely malignant 2. A. Fib 3. CHF 4. HTN  Plan: 1. Cardiology is already seeing the patient.  We would ask them if the patient is in as good of shape cardiac wise as possible for surgical intervention.  If so, then we will plan on proceeding with right hemicolectomy around mid week.  In the mean time, I have ordered a CT scan of the chest/abd/pel to rule out metastatic disease.  He should remain on clear liquids and NOT be advanced passed this to avoid another bowel prep before surgery.  We will check a CEA.  Continue to hold coumadin and remain on heparin.  We will cut his drip off about 8 hours prior to surgery the day of.  I have discussed all of this with him and his family.  He is still a little sleepy and may not remember all we have discussed though.  As of now, they all understand and are willing to proceed with surgical resection.  Mirra Basilio E 02/08/2012, 7:20 PM Pager: [phone]

## 2012-02-08 NOTE — Progress Notes (Signed)
SUBJECTIVE:  Resting well after colonoscopy which showed transverse colonic mass and 3 sessile polyps in cecum  OBJECTIVE:   Vitals:   Filed Vitals:   02/08/12 1423 02/08/12 1430 02/08/12 1440 02/08/12 1450  BP: 113/55 113/55 119/44 145/113  Pulse:      Temp:      TempSrc:      Resp: 15 20 15 16   Height:      Weight:      SpO2: 100% 100% 100%    I&O's:   Intake/Output Summary (Last 24 hours) at 02/08/12 1554 Last data filed at 02/08/12 1500  Gross per 24 hour  Intake 2176.63 ml  Output   1232 ml  Net 944.63 ml   TELEMETRY: Reviewed telemetry pt in atrial fibrillation     PHYSICAL EXAM General: Well developed, well nourished, in no acute distress Head: Eyes PERRLA, No xanthomas.   Normal cephalic and atramatic  Lungs:   Clear bilaterally to auscultation and percussion. Heart:   HRRR S1 S2 Pulses are 2+ & equal. Abdomen: Bowel sounds are positive, abdomen soft and non-tender without masses  Extremities:   Trace edema  bilaterally Neuro: Alert and oriented X 3. Psych:  Good affect, responds appropriately   LABS: Basic Metabolic Panel:  Basename 02/08/12 0445 02/06/12 0515  NA 142 140  K 4.5 3.5  CL 108 106  CO2 25 28  GLUCOSE 95 92  BUN 23 23  CREATININE 1.20 1.34  CALCIUM 9.3 8.6  MG -- --  PHOS -- --   Liver Function Tests: No results found for this basename: AST:2,ALT:2,ALKPHOS:2,BILITOT:2,PROT:2,ALBUMIN:2 in the last 72 hours No results found for this basename: LIPASE:2,AMYLASE:2 in the last 72 hours CBC:  Basename 02/08/12 0445 02/07/12 0545  WBC 5.4 5.9  NEUTROABS -- --  HGB 8.9* 8.0*  HCT 29.4* 26.4*  MCV 75.2* 74.4*  PLT 225 211   Coag Panel:   Lab Results  Component Value Date   INR 1.32 02/08/2012   INR 1.31 02/07/2012   INR 1.59* 02/06/2012    RADIOLOGY: Dg Chest 2 View  02/03/2012  *RADIOLOGY REPORT*  Clinical Data: Short of breath.  Weakness.  CHEST - 2 VIEW  Comparison: None.  Findings: Cardiomegaly.  Pulmonary vascular congestion.   Elevation of the left hemidiaphragm.  Left basilar atelectasis.  No focal consolidation.  Mitral valve replacement.  Interstitial pulmonary edema is present.  IMPRESSION: Cardiomegaly, pulmonary vascular congestion and interstitial pulmonary edema compatible with mild CHF.   Original Report Authenticated By: Andreas Newport, M.D.     Assessment/Plan:  Principal Problem:  *CHF (congestive heart failure)  Active Problems:  Chronic a-fib  ARF (acute renal failure)  HTN (hypertension)  S/P MVR (mitral valve replacement)  GIB (gastrointestinal bleeding)  Iron deficiency anemia  Acute on chronic systolic CHF (congestive heart failure)  Non-sustained ventricular tachycardia   78 year old male who was admitted with anemia, status post transfusion, GI bleed with mechanical St. Jude mitral valve, chronic anticoagulation, hypertension, persistent chronic atrial fibrillation fairly well rate controlled with chronic lower extremity edema consistent with acute on chronic diastolic/systolic heart failure, past EF 40% now 20-25%  1. Mechanical mitral valve-heparin IV. INR less than 2.5.  No coumadin for now until surgery has made determination of whether to proceed with resection of colon 2. Atrial fibrillation-persistent. Increased his metoprolol from 25 mg a day to 50 mg a day on 11/29. Helping. Overall he is fairly well rate controlled.  3. Acute on chronic diastolic/systolic heart failure-prior ejection fraction in  the 40% range now 20-25%. Has decreased lower extremity edema.  4. Anemia/GI bleed-per primary team/GI. colonoscopy today with transverse colon mass worrisome for CA - surgery consulted.  No coumadin until surgery determines how to proceed. 5. Non sustained VT - brief. Metoprolol. Asymptomatic. Keep K > 4. This is expected with EF 20-25%. Would not be candidate for ICD due to age.       Quintella Reichert, MD  02/08/2012  3:54 PM

## 2012-02-08 NOTE — Progress Notes (Signed)
IV attempt 2x. Unsuccessful. IV team paged

## 2012-02-09 ENCOUNTER — Encounter (HOSPITAL_COMMUNITY): Payer: Self-pay

## 2012-02-09 ENCOUNTER — Encounter (HOSPITAL_COMMUNITY): Payer: Self-pay | Admitting: Internal Medicine

## 2012-02-09 ENCOUNTER — Inpatient Hospital Stay (HOSPITAL_COMMUNITY): Payer: Medicare Other

## 2012-02-09 DIAGNOSIS — C189 Malignant neoplasm of colon, unspecified: Secondary | ICD-10-CM

## 2012-02-09 LAB — CBC
HCT: 29.1 % — ABNORMAL LOW (ref 39.0–52.0)
MCHC: 30.2 g/dL (ref 30.0–36.0)
MCV: 75.6 fL — ABNORMAL LOW (ref 78.0–100.0)
Platelets: 213 10*3/uL (ref 150–400)
RDW: 21 % — ABNORMAL HIGH (ref 11.5–15.5)

## 2012-02-09 LAB — HEPARIN LEVEL (UNFRACTIONATED): Heparin Unfractionated: 0.18 IU/mL — ABNORMAL LOW (ref 0.30–0.70)

## 2012-02-09 MED ORDER — IOHEXOL 300 MG/ML  SOLN
100.0000 mL | Freq: Once | INTRAMUSCULAR | Status: AC | PRN
  Administered 2012-02-09: 100 mL via INTRAVENOUS

## 2012-02-09 NOTE — Progress Notes (Signed)
ANTICOAGULATION CONSULT NOTE - Follow Up Consult  Pharmacy Consult for heparin Indication: chronic afib, st Jude mitral valve  No Known Allergies  Patient Measurements: Height: 5\' 7"  (170.2 cm) Weight: 148 lb 6.4 oz (67.314 kg) (b scale) IBW/kg (Calculated) : 66.1  Heparin Dosing Weight: 75 kg  Vital Signs: Temp: 97.3 F (36.3 C) (12/03 1349) Temp src: Oral (12/03 1349) BP: 115/54 mmHg (12/03 1349) Pulse Rate: 70  (12/03 1349)  Labs:  Basename 02/09/12 1632 02/09/12 0547 02/09/12 0005 02/08/12 0445 02/07/12 0545  HGB -- 8.8* -- 8.9* --  HCT -- 29.1* -- 29.4* 26.4*  PLT -- 213 -- 225 211  APTT -- -- -- -- --  LABPROT -- -- -- 16.1* 16.0*  INR -- -- -- 1.32 1.31  HEPARINUNFRC 0.42 0.18* 0.22* -- --  CREATININE -- -- -- 1.20 --  CKTOTAL -- -- -- -- --  CKMB -- -- -- -- --  TROPONINI -- -- -- -- --    Estimated Creatinine Clearance: 42.1 ml/min (by C-G formula based on Cr of 1.2).  Assessment: 78 year old male with chronic afib and St. Jude MVR on warfarin prior to admit. Patient admitted with dyspnea on exertion and found to have FOBT+ in ED. On IV heparin and holding warfarin, likely to OR 12/4 for colectomy 2/2 mass found during endoscopy.   AC: On Warfarin PTA for Mechanical Mitral Valve and h/o A-fib, off warfarin and on heparin drip, to OR likely on 12/4 for colectomy 2/2 mass. Heparin level now 0.42 in goal.  PTA Dose: 5mg  warfarin Tues/Thurs/Sunday, 2.5mg  all other days with admit INR 2.96  GI: Transverse colon mass, causing hematochezia. Biopsies pending but clinically this is cancer. Awaiting staging CT scan.    Goal of Therapy:  Heparin level 0.3-0.7 units/ml Monitor platelets by anticoagulation protocol: Yes   Plan:  Heparin 900 units/hr Next heparin level in am.  Misty Stanley Stillinger 02/09/2012,5:24 PM

## 2012-02-09 NOTE — Progress Notes (Addendum)
SUBJECTIVE:  Doing well no compliants  OBJECTIVE:   Vitals:   Filed Vitals:   02/08/12 1450 02/08/12 2036 02/09/12 0442 02/09/12 1052  BP: 145/113 143/68 124/84 132/72  Pulse:  79 73 70  Temp:  98.1 F (36.7 C) 97.7 F (36.5 C)   TempSrc:  Oral Oral   Resp: 16 16 18    Height:      Weight:   67.314 kg (148 lb 6.4 oz)   SpO2:  100% 100%    I&O's:   Intake/Output Summary (Last 24 hours) at 02/09/12 1307 Last data filed at 02/09/12 1100  Gross per 24 hour  Intake 1432.38 ml  Output   1925 ml  Net -492.62 ml   TELEMETRY: Reviewed telemetry pt in atrial fibrillation     PHYSICAL EXAM General: Well developed, well nourished, in no acute distress Lungs:   Clear bilaterally to auscultation and percussion. Heart:   Irregularly irregular S1 S2 Pulses are 2+ & equal. Abdomen: Bowel sounds are positive, abdomen soft and non-tender without masses Extremities:   Trace edema Neuro: Alert and oriented X 3. Psych:  Good affect, responds appropriately   LABS: Basic Metabolic Panel:  Basename 02/08/12 0445  NA 142  K 4.5  CL 108  CO2 25  GLUCOSE 95  BUN 23  CREATININE 1.20  CALCIUM 9.3  MG --  PHOS --   Liver Function Tests: No results found for this basename: AST:2,ALT:2,ALKPHOS:2,BILITOT:2,PROT:2,ALBUMIN:2 in the last 72 hours No results found for this basename: LIPASE:2,AMYLASE:2 in the last 72 hours CBC:  Basename 02/09/12 0547 02/08/12 0445  WBC 7.7 5.4  NEUTROABS -- --  HGB 8.8* 8.9*  HCT 29.1* 29.4*  MCV 75.6* 75.2*  PLT 213 225   Coag Panel:   Lab Results  Component Value Date   INR 1.32 02/08/2012   INR 1.31 02/07/2012   INR 1.59* 02/06/2012    RADIOLOGY: Dg Chest 2 View  02/03/2012  *RADIOLOGY REPORT*  Clinical Data: Short of breath.  Weakness.  CHEST - 2 VIEW  Comparison: None.  Findings: Cardiomegaly.  Pulmonary vascular congestion.  Elevation of the left hemidiaphragm.  Left basilar atelectasis.  No focal consolidation.  Mitral valve replacement.   Interstitial pulmonary edema is present.  IMPRESSION: Cardiomegaly, pulmonary vascular congestion and interstitial pulmonary edema compatible with mild CHF.   Original Report Authenticated By: Andreas Newport, M.D.    Ct Chest W Contrast  02/09/2012  *RADIOLOGY REPORT*  Clinical Data:  Status post colonoscopy 02/08/2012 where a circumferential ulcerated mass was identified transverse colon.  CT CHEST, ABDOMEN AND PELVIS WITH CONTRAST  Technique:  Multidetector CT imaging of the chest, abdomen and pelvis was performed following the standard protocol during bolus administration of intravenous contrast.  Contrast: OMNIPAQUE IOHEXOL 300 MG/ML  SOLN  Comparison:  Plain films of the chest 02/03/2012.  CT CHEST  Findings:  There is no axillary, hilar or mediastinal lymphadenopathy.  Trace pleural fluid on the left is noted.  There is no right pleural effusion or pericardial effusion.  Massive cardiomegaly is identified.  The patient is status post CABG and mitral valve replacement.  No pulmonary nodule or mass is identified.  There is some mild dependent atelectasis.  No focal bony abnormality is identified.  IMPRESSION:  1.  Negative for metastatic or acute disease. 2.  Massive cardiomegaly.  CT ABDOMEN AND PELVIS  Findings:  The liver, adrenal glands, spleen, pancreas and kidneys all appear normal.  Multiple stones are seen within the gallbladder but there  is no CT evidence of cholecystitis.  The patient has a left inguinal hernia containing a loop of descending colon.  No obstruction or other complicating feature is identified.  Scattered colonic diverticula without diverticulitis are noted.  Focal wall thickening is seen in the transverse colon just distal to the hepatic flexure which may represent the patient's mass seen on colonoscopy. The walls of the rectosigmoid colon and cecum are thickened although these loops are under distended.  Stomach, small bowel and appendix are normal in appearance.  The prostate  gland is mildly enlarged.  Urinary bladder appears normal.  There is no lymphadenopathy or fluid.  No focal bony abnormality is identified.  IMPRESSION:  1.  Negative for acute or metastatic disease. 2.  Wall thickening in the transverse colon just distal to the hepatic flexure may represent the patient's known colon mass.  Wall thickening of the cecum and rectosigmoid colon is likely due to under distention.  3.  Mild enlargement of the prostate gland. 4.  Diverticulosis without diverticulitis. 5.  Left inguinal hernia contains a knuckle of colon without obstruction or other complicating feature. 6.  Gallstones without cholecystitis.   Original Report Authenticated By: Holley Dexter, M.D.    Ct Abdomen Pelvis W Contrast  02/09/2012  *RADIOLOGY REPORT*  Clinical Data:  Status post colonoscopy 02/08/2012 where a circumferential ulcerated mass was identified transverse colon.  CT CHEST, ABDOMEN AND PELVIS WITH CONTRAST  Technique:  Multidetector CT imaging of the chest, abdomen and pelvis was performed following the standard protocol during bolus administration of intravenous contrast.  Contrast: OMNIPAQUE IOHEXOL 300 MG/ML  SOLN  Comparison:  Plain films of the chest 02/03/2012.  CT CHEST  Findings:  There is no axillary, hilar or mediastinal lymphadenopathy.  Trace pleural fluid on the left is noted.  There is no right pleural effusion or pericardial effusion.  Massive cardiomegaly is identified.  The patient is status post CABG and mitral valve replacement.  No pulmonary nodule or mass is identified.  There is some mild dependent atelectasis.  No focal bony abnormality is identified.  IMPRESSION:  1.  Negative for metastatic or acute disease. 2.  Massive cardiomegaly.  CT ABDOMEN AND PELVIS  Findings:  The liver, adrenal glands, spleen, pancreas and kidneys all appear normal.  Multiple stones are seen within the gallbladder but there is no CT evidence of cholecystitis.  The patient has a left inguinal  hernia containing a loop of descending colon.  No obstruction or other complicating feature is identified.  Scattered colonic diverticula without diverticulitis are noted.  Focal wall thickening is seen in the transverse colon just distal to the hepatic flexure which may represent the patient's mass seen on colonoscopy. The walls of the rectosigmoid colon and cecum are thickened although these loops are under distended.  Stomach, small bowel and appendix are normal in appearance.  The prostate gland is mildly enlarged.  Urinary bladder appears normal.  There is no lymphadenopathy or fluid.  No focal bony abnormality is identified.  IMPRESSION:  1.  Negative for acute or metastatic disease. 2.  Wall thickening in the transverse colon just distal to the hepatic flexure may represent the patient's known colon mass.  Wall thickening of the cecum and rectosigmoid colon is likely due to under distention.  3.  Mild enlargement of the prostate gland. 4.  Diverticulosis without diverticulitis. 5.  Left inguinal hernia contains a knuckle of colon without obstruction or other complicating feature. 6.  Gallstones without cholecystitis.  Original Report Authenticated By: Holley Dexter, M.D.    Assessment/Plan:  Principal Problem:  *CHF (congestive heart failure)  Active Problems:  Chronic a-fib  ARF (acute renal failure)  HTN (hypertension)  S/P MVR (mitral valve replacement)  GIB (gastrointestinal bleeding)  Iron deficiency anemia  Acute on chronic systolic CHF (congestive heart failure)  Non-sustained ventricular tachycardia  Colonic mass most likely CA 78 year old male who was admitted with anemia, status post transfusion, GI bleed with mechanical St. Jude mitral valve, chronic anticoagulation, hypertension, persistent chronic atrial fibrillation fairly well rate controlled with chronic lower extremity edema consistent with acute on chronic diastolic/systolic heart failure, past EF 40% now 20-25%  1.  Mechanical mitral valve-heparin IV. INR less than 2.5. No coumadin for now until surgery  2. Atrial fibrillation-persistent rate controlled.  3. Acute on chronic diastolic/systolic heart failure-prior ejection fraction in the 40% range now 20-25% - improved 4. Anemia/GI bleed-per primary team/GI. colonoscopy today with transverse colon mass worrisome for CA - surgery consulted. No coumadin until surgery determines how to proceed.  5. Non sustained VT - brief. Metoprolol. Asymptomatic. Keep K > 4. This is expected with EF 20-25%. Would not be candidate for ICD due to age.  6.  Colonic mass - very difficult situation.  He needs mass removed otherwise restarting coumadin for his MVR will be difficult giving ongoing bleeding from his mass.  If is not on chronic anticoagulation his risk of valve thrombosis is extremely high.  He is at least moderate risk if not high risk for surgery from a cardiac standpoint due to severe LV dysfunction.  His risk includes the risk of CHF exacerbation as well as intraop or periop infarct since we do not know if he has CAD that has resulted in decline in his EF.  If ultimately it is determined that he is a candidate for surgery ( no evidence of metastatic disease) then would recommend use of intraop IV NTG to maximize coronary blood flow and also avoid fluid overload.  He currently is compensated with his CHF and heart rate is controlled.          Quintella Reichert, MD  02/09/2012  1:07 PM

## 2012-02-09 NOTE — Progress Notes (Signed)
Consent obtained from pt for Laparoscopic assisted partial colectomy as ordered by Dr. Donell Beers.  Amanda Pea, Charity fundraiser.

## 2012-02-09 NOTE — Progress Notes (Signed)
Will complete staging workup with scans. Plan lap R colon tomorrow unless widespread metastatic disease present. Discussed with wife and daughter. Reviewed risks of infection, leak, bleeding, ostomy, cardiac events.

## 2012-02-09 NOTE — Progress Notes (Signed)
ANTICOAGULATION CONSULT NOTE - Follow Up Consult  Pharmacy Consult for heparin Indication: chronic afib, st Jude mitral valve  No Known Allergies  Patient Measurements: Height: 5\' 7"  (170.2 cm) Weight: 151 lb (68.493 kg) (b scale) IBW/kg (Calculated) : 66.1  Heparin Dosing Weight: 75 kg  Vital Signs: Temp: 98.1 F (36.7 C) (12/02 2036) Temp src: Oral (12/02 2036) BP: 143/68 mmHg (12/02 2036) Pulse Rate: 79  (12/02 2036)  Labs:  Basename 02/09/12 0005 02/08/12 0445 02/07/12 0545 02/06/12 0515  HGB -- 8.9* 8.0* --  HCT -- 29.4* 26.4* 25.9*  PLT -- 225 211 197  APTT -- -- -- --  LABPROT -- 16.1* 16.0* 18.5*  INR -- 1.32 1.31 1.59*  HEPARINUNFRC 0.22* 0.46 0.60 --  CREATININE -- 1.20 -- 1.34  CKTOTAL -- -- -- --  CKMB -- -- -- --  TROPONINI -- -- -- --    Estimated Creatinine Clearance: 42.1 ml/min (by C-G formula based on Cr of 1.2).  Assessment: 78 yo male with chronic afib and St Jude MVR for Heparin  Goal of Therapy:  Heparin level 0.3-0.7 units/ml Monitor platelets by anticoagulation protocol: Yes   Plan:  As patient was previously therapeutic at current rate, expect level to increase with time.  Will continue Heparin at same rate for now, f/u am labs    [name], Gary Fleet 02/09/2012,12:56 AM

## 2012-02-09 NOTE — Progress Notes (Signed)
[facility] PROGRESS NOTE  Alfred Dennis WGN:[id number] DOB: 1924/02/23 DOA: 02/03/2012 PCP: No primary provider on file.  Assessment/Plan: Acute on chronic systolic CHF  -EF-25-30  -Furosemide changed to po--continue  -Appreciate cardiology recommendations  -Continue Toprol-XL  -Daily weights, strict I.'s and O.'s  -neg 8284cc for the admission  -TSH 3.922  Transverse colon mass  -Noted on colonoscopy  -Gen. Surgery plans R-hemicolectomy if no metastatic disease -cardiology following for clearance--appreciate input NSVTach  -no further epsiodes with increase of metoprolol  -due to cardiomyopathy  -continue metoprolol  Blood loss Anemia/Iron deficiency  -Transfused 2 units PRBCs 11/27--hemoglobin remained stable  -appreciate GI followup  -Hold warfarin  -With high risk of thrombosis due to St. Jude MVR, continue heparin gtt with anticipated surgery for colon mass  -continue iron supplementation  Chronic atrial fibrillation/history of St. Jude mitral valve replacement  -Continue metoprolol succinate-dose increased  -Rate controlled currently  -Anticoagulation as discussed above  Venous stasis dermatitis-legs  -No need for antibiotics for his legs   Procedures:  Colonoscopy and EGD 02/08/2012    Family Communication:   Daughter at beside Disposition Plan:   Home when medically stable      Procedures/Studies: Dg Chest 2 View  02/03/2012  *RADIOLOGY REPORT*  Clinical Data: Short of breath.  Weakness.  CHEST - 2 VIEW  Comparison: None.  Findings: Cardiomegaly.  Pulmonary vascular congestion.  Elevation of the left hemidiaphragm.  Left basilar atelectasis.  No focal consolidation.  Mitral valve replacement.  Interstitial pulmonary edema is present.  IMPRESSION: Cardiomegaly, pulmonary vascular congestion and interstitial pulmonary edema compatible with mild CHF.   Original Report Authenticated By: Andreas Newport, M.D.    Ct Chest W Contrast  02/09/2012   *RADIOLOGY REPORT*  Clinical Data:  Status post colonoscopy 02/08/2012 where a circumferential ulcerated mass was identified transverse colon.  CT CHEST, ABDOMEN AND PELVIS WITH CONTRAST  Technique:  Multidetector CT imaging of the chest, abdomen and pelvis was performed following the standard protocol during bolus administration of intravenous contrast.  Contrast: OMNIPAQUE IOHEXOL 300 MG/ML  SOLN  Comparison:  Plain films of the chest 02/03/2012.  CT CHEST  Findings:  There is no axillary, hilar or mediastinal lymphadenopathy.  Trace pleural fluid on the left is noted.  There is no right pleural effusion or pericardial effusion.  Massive cardiomegaly is identified.  The patient is status post CABG and mitral valve replacement.  No pulmonary nodule or mass is identified.  There is some mild dependent atelectasis.  No focal bony abnormality is identified.  IMPRESSION:  1.  Negative for metastatic or acute disease. 2.  Massive cardiomegaly.  CT ABDOMEN AND PELVIS  Findings:  The liver, adrenal glands, spleen, pancreas and kidneys all appear normal.  Multiple stones are seen within the gallbladder but there is no CT evidence of cholecystitis.  The patient has a left inguinal hernia containing a loop of descending colon.  No obstruction or other complicating feature is identified.  Scattered colonic diverticula without diverticulitis are noted.  Focal wall thickening is seen in the transverse colon just distal to the hepatic flexure which may represent the patient's mass seen on colonoscopy. The walls of the rectosigmoid colon and cecum are thickened although these loops are under distended.  Stomach, small bowel and appendix are normal in appearance.  The prostate gland is mildly enlarged.  Urinary bladder appears normal.  There is no lymphadenopathy or fluid.  No focal bony abnormality is identified.  IMPRESSION:  1.  Negative  for acute or metastatic disease. 2.  Wall thickening in the transverse colon just  distal to the hepatic flexure may represent the patient's known colon mass.  Wall thickening of the cecum and rectosigmoid colon is likely due to under distention.  3.  Mild enlargement of the prostate gland. 4.  Diverticulosis without diverticulitis. 5.  Left inguinal hernia contains a knuckle of colon without obstruction or other complicating feature. 6.  Gallstones without cholecystitis.   Original Report Authenticated By: Holley Dexter, M.D.    Ct Abdomen Pelvis W Contrast  02/09/2012  *RADIOLOGY REPORT*  Clinical Data:  Status post colonoscopy 02/08/2012 where a circumferential ulcerated mass was identified transverse colon.  CT CHEST, ABDOMEN AND PELVIS WITH CONTRAST  Technique:  Multidetector CT imaging of the chest, abdomen and pelvis was performed following the standard protocol during bolus administration of intravenous contrast.  Contrast: OMNIPAQUE IOHEXOL 300 MG/ML  SOLN  Comparison:  Plain films of the chest 02/03/2012.  CT CHEST  Findings:  There is no axillary, hilar or mediastinal lymphadenopathy.  Trace pleural fluid on the left is noted.  There is no right pleural effusion or pericardial effusion.  Massive cardiomegaly is identified.  The patient is status post CABG and mitral valve replacement.  No pulmonary nodule or mass is identified.  There is some mild dependent atelectasis.  No focal bony abnormality is identified.  IMPRESSION:  1.  Negative for metastatic or acute disease. 2.  Massive cardiomegaly.  CT ABDOMEN AND PELVIS  Findings:  The liver, adrenal glands, spleen, pancreas and kidneys all appear normal.  Multiple stones are seen within the gallbladder but there is no CT evidence of cholecystitis.  The patient has a left inguinal hernia containing a loop of descending colon.  No obstruction or other complicating feature is identified.  Scattered colonic diverticula without diverticulitis are noted.  Focal wall thickening is seen in the transverse colon just distal to the  hepatic flexure which may represent the patient's mass seen on colonoscopy. The walls of the rectosigmoid colon and cecum are thickened although these loops are under distended.  Stomach, small bowel and appendix are normal in appearance.  The prostate gland is mildly enlarged.  Urinary bladder appears normal.  There is no lymphadenopathy or fluid.  No focal bony abnormality is identified.  IMPRESSION:  1.  Negative for acute or metastatic disease. 2.  Wall thickening in the transverse colon just distal to the hepatic flexure may represent the patient's known colon mass.  Wall thickening of the cecum and rectosigmoid colon is likely due to under distention.  3.  Mild enlargement of the prostate gland. 4.  Diverticulosis without diverticulitis. 5.  Left inguinal hernia contains a knuckle of colon without obstruction or other complicating feature. 6.  Gallstones without cholecystitis.   Original Report Authenticated By: Holley Dexter, M.D.          Subjective:   Objective: Filed Vitals:   02/09/12 0442 02/09/12 1052 02/09/12 1349 02/09/12 2137  BP: 124/84 132/72 115/54 121/59  Pulse: 73 70 70 69  Temp: 97.7 F (36.5 C)  97.3 F (36.3 C) 97.1 F (36.2 C)  TempSrc: Oral  Oral Oral  Resp: 18  18 18   Height:      Weight: 67.314 kg (148 lb 6.4 oz)     SpO2: 100%  100% 98%    Intake/Output Summary (Last 24 hours) at 02/09/12 2305 Last data filed at 02/09/12 2137  Gross per 24 hour  Intake 1312.38 ml  Output   1750 ml  Net -437.62 ml   Weight change: -1.179 kg (-2 lb 9.6 oz) Exam:   General:  Pt is alert, follows commands appropriately, not in acute distress  HEENT: No icterus, No thrush, No neck mass, [place]/AT  Cardiovascular: RRR, S1/S2, no rubs, no gallops  Respiratory: CTA bilaterally, no wheezing, no crackles, no rhonchi  Abdomen: Soft/+BS, non tender, non distended, no guarding  Extremities: No edema, No lymphangitis, No petechiae, No rashes, no synovitis  Data  Reviewed: Basic Metabolic Panel:  Lab 02/08/12 1610 02/06/12 0515 02/05/12 0310 02/04/12 0455 02/03/12 1126  NA 142 140 139 140 138  K 4.5 3.5 3.5 4.3 4.7  CL 108 106 104 108 103  CO2 25 28 26 25  --  GLUCOSE 95 92 114* 92 110*  BUN 23 23 22 20  34*  CREATININE 1.20 1.34 1.25 1.18 1.40*  CALCIUM 9.3 8.6 8.7 9.1 --  MG -- -- -- -- --  PHOS -- -- -- -- --   Liver Function Tests: No results found for this basename: AST:5,ALT:5,ALKPHOS:5,BILITOT:5,PROT:5,ALBUMIN:5 in the last 168 hours No results found for this basename: LIPASE:5,AMYLASE:5 in the last 168 hours No results found for this basename: AMMONIA:5 in the last 168 hours CBC:  Lab 02/09/12 0547 02/08/12 0445 02/07/12 0545 02/06/12 0515 02/05/12 1030 02/05/12 0310 02/03/12 1113  WBC 7.7 5.4 5.9 5.3 -- 6.9 --  NEUTROABS -- -- -- -- -- -- 4.8  HGB 8.8* 8.9* 8.0* 8.0* 8.1* -- --  HCT 29.1* 29.4* 26.4* 25.9* 25.6* -- --  MCV 75.6* 75.2* 74.4* 74.6* -- 73.3* --  PLT 213 225 211 197 -- 181 --   Cardiac Enzymes:  Lab 02/04/12 0445 02/03/12 2158  CKTOTAL -- --  CKMB -- --  CKMBINDEX -- --  TROPONINI <0.30 <0.30   BNP: No components found with this basename: POCBNP:5 CBG:  Lab 02/08/12 1112  GLUCAP 116*    No results found for this or any previous visit (from the past 240 hour(s)).   Scheduled Meds:   . ferrous sulfate  325 mg Oral TID WC  . furosemide  40 mg Oral Daily  . metoprolol succinate  50 mg Oral Daily  . pantoprazole  40 mg Oral QAC lunch  . simvastatin  10 mg Oral q1800  . sodium chloride  3 mL Intravenous Q12H   Continuous Infusions:   . heparin 900 Units/hr (02/09/12 0848)     Myia Bergh, DO  [facility] Pager [phone]  If 7PM-7AM, please contact night-coverage [email or web address] Password [email or web address] 02/09/2012, 11:05 PM   LOS: 6 days

## 2012-02-09 NOTE — Progress Notes (Signed)
1 Day Post-Op  Subjective: Resting quietly, wife and daughter at bedside. He appears to be in no acute distress and offers no new c/o this morning.  Objective: Vital signs in last 24 hours: Temp:  [97.7 F (36.5 C)-98.3 F (36.8 C)] 97.7 F (36.5 C) (12/03 0442) Pulse Rate:  [73-117] 73  (12/03 0442) Resp:  [14-20] 18  (12/03 0442) BP: (101-145)/(44-113) 124/84 mmHg (12/03 0442) SpO2:  [98 %-100 %] 100 % (12/03 0442) Weight:  [148 lb 6.4 oz (67.314 kg)] 148 lb 6.4 oz (67.314 kg) (12/03 0442) Last BM Date: 02/08/12  Intake/Output from previous day: 12/02 0701 - 12/03 0700 In: 952.4 [P.O.:840; I.V.:112.4] Out: 1227 [Urine:1225; Stool:2] Intake/Output this shift:    General appearance: alert, cooperative, appears stated age and no distress Chest: CTA bilaterally Cardiac: RRR  Abdomen: soft, + BS, non tender Extremities: warm to touch, no edema or tenderness, + pulses Labs: wbc up slightly but still wnl, H&H stable, PLTs down slightly.  Lab Results:   [facility] 02/09/12 0547 02/08/12 0445  WBC 7.7 5.4  HGB 8.8* 8.9*  HCT 29.1* 29.4*  PLT 213 225   BMET  Basename 02/08/12 0445  NA 142  K 4.5  CL 108  CO2 25  GLUCOSE 95  BUN 23  CREATININE 1.20  CALCIUM 9.3   PT/INR  Basename 02/08/12 0445 02/07/12 0545  LABPROT 16.1* 16.0*  INR 1.32 1.31   ABG No results found for this basename: PHART:2,PCO2:2,PO2:2,HCO3:2 in the last 72 hours  Studies/Results: No results found.  Anti-infectives: Anti-infectives    None      Assessment/Plan: Patient Active Problem List  Diagnosis  . Chronic a-fib  . CHF (congestive heart failure)  . ARF (acute renal failure)  . HTN (hypertension)  . S/P MVR (mitral valve replacement)  . GIB (gastrointestinal bleeding)  . Iron deficiency anemia  . Acute on chronic systolic CHF (congestive heart failure)  . Non-sustained ventricular tachycardia  . Carcinoma of colon  . Benign neoplasm of colon  . Gastritis  . Colonic mass   s/p Procedure(s) (LRB) with comments: ESOPHAGOGASTRODUODENOSCOPY (EGD) (N/A) COLONOSCOPY (N/A)   Plan:  1. Ct of abdomen and chest today 2. Cardiology to see patient for pre-op clearance 3. NPO after midnight if cleared by Cardiology 4. Probable right hemicolectomy to level of cecum tomorrow for transverse colon mass, with sessile polyps in cecum. 5. Management of his other medical needs per medicine team.   LOS: 6 days    Golda Acre [facility] Pager # [phone] 02/09/2012

## 2012-02-09 NOTE — Progress Notes (Signed)
     Alfred Dennis Daily Rounding Note 02/09/2012, 8:36 AM  Notes reviewed from Dr Mayford Knife, Dr Donell Beers.  Plans are to proceed to "resection unless widespread disease found, and then palliative resection for bleeding may still be indicated"  A/P *  Transverse colon mass, causing hematochezia.  Biopsies pending but clinically this is cancer.  Awaiting staging CT scan.  Surgery likely to proceed this week with blessing of Cardiologist.  *  Chronic Coumadin owing to [facility] mitral valve and A fib. On hold, heparin in place *  ABL anemia. Iron deficient.  Due to bleeding colon mass. 2 units PRBC thus far.  *  CHF  Dennis will sign off.  Call if questions or reinvolvement needed.     LOS: 6 days   Jennye Moccasin  02/09/2012, 8:36 AM Pager: [phone]   Addendum, 1:57 PM:  CT CHEST 1. Negative for metastatic or acute disease.  2. Massive cardiomegaly.  CT ABDOMEN AND PELVIS IMPRESSION:  1. Negative for acute or metastatic disease.  2. Wall thickening in the transverse colon just distal to the  hepatic flexure may represent the patient's known colon mass. Wall  thickening of the cecum and rectosigmoid colon is likely due to  under distention.  3. Mild enlargement of the prostate gland.  4. Diverticulosis without diverticulitis.  5. Left inguinal hernia contains a knuckle of colon without  obstruction or other complicating feature.  6. Gallstones without cholecystitis.  IMPRESSION:  1. Negative for acute or metastatic disease.  2. Wall thickening in the transverse colon just distal to the  hepatic flexure may represent the patient's known colon mass. Wall  thickening of the cecum and rectosigmoid colon is likely due to  under distention.  3. Mild enlargement of the prostate gland.  4. Diverticulosis without diverticulitis.  5. Left inguinal hernia contains a knuckle of colon without  obstruction or other complicating feature.  6. Gallstones without cholecystitis.  IMPRESSION:  1.  Negative for acute or metastatic disease.  2. Wall thickening in the transverse colon just distal to the  hepatic flexure may represent the patient's known colon mass. Wall  thickening of the cecum and rectosigmoid colon is likely due to  under distention.  3. Mild enlargement of the prostate gland.  4. Diverticulosis without diverticulitis.  5. Left inguinal hernia contains a knuckle of colon without  obstruction or other complicating feature.  6. Gallstones without cholecystitis.

## 2012-02-09 NOTE — Progress Notes (Signed)
ANTICOAGULATION CONSULT NOTE - Follow Up Consult  Pharmacy Consult for heparin Indication: chronic afib, st Jude mitral valve  No Known Allergies  Patient Measurements: Height: 5\' 7"  (170.2 cm) Weight: 148 lb 6.4 oz (67.314 kg) (b scale) IBW/kg (Calculated) : 66.1  Heparin Dosing Weight: 75 kg  Vital Signs: Temp: 97.7 F (36.5 C) (12/03 0442) Temp src: Oral (12/03 0442) BP: 124/84 mmHg (12/03 0442) Pulse Rate: 73  (12/03 0442)  Labs:  Basename 02/09/12 0547 02/09/12 0005 02/08/12 0445 02/07/12 0545  HGB 8.8* -- 8.9* --  HCT 29.1* -- 29.4* 26.4*  PLT 213 -- 225 211  APTT -- -- -- --  LABPROT -- -- 16.1* 16.0*  INR -- -- 1.32 1.31  HEPARINUNFRC 0.18* 0.22* 0.46 --  CREATININE -- -- 1.20 --  CKTOTAL -- -- -- --  CKMB -- -- -- --  TROPONINI -- -- -- --    Estimated Creatinine Clearance: 42.1 ml/min (by C-G formula based on Cr of 1.2).  Assessment: 78 yo male with chronic afib and St Jude MVR for Heparin.  Heparin level did not increase further.  Goal of Therapy:  Heparin level 0.3-0.7 units/ml Monitor platelets by anticoagulation protocol: Yes   Plan:  Increase Heparin 900 units/hr Check heparin level in 8 hours.  Saron Vanorman, Gary Fleet 02/09/2012,7:45 AM

## 2012-02-09 NOTE — Progress Notes (Signed)
Agree with Ms. Gribbin's assessment and plan. Story Vanvranken E. Ansel Ferrall, MD, FACG  

## 2012-02-10 ENCOUNTER — Inpatient Hospital Stay (HOSPITAL_COMMUNITY): Payer: Medicare Other | Admitting: Anesthesiology

## 2012-02-10 ENCOUNTER — Encounter (HOSPITAL_COMMUNITY): Payer: Self-pay | Admitting: Anesthesiology

## 2012-02-10 ENCOUNTER — Encounter (HOSPITAL_COMMUNITY)
Admission: EM | Disposition: A | Discharge status: Home Health | Payer: Self-pay | Source: Home / Self Care | Attending: Internal Medicine

## 2012-02-10 DIAGNOSIS — Z954 Presence of other heart-valve replacement: Secondary | ICD-10-CM

## 2012-02-10 DIAGNOSIS — C189 Malignant neoplasm of colon, unspecified: Secondary | ICD-10-CM

## 2012-02-10 HISTORY — PX: LAPAROSCOPIC PARTIAL COLECTOMY: SHX5907

## 2012-02-10 LAB — CBC
HCT: 29.6 % — ABNORMAL LOW (ref 39.0–52.0)
Hemoglobin: 9 g/dL — ABNORMAL LOW (ref 13.0–17.0)
MCH: 23.1 pg — ABNORMAL LOW (ref 26.0–34.0)
MCHC: 30.4 g/dL (ref 30.0–36.0)
RDW: 21.5 % — ABNORMAL HIGH (ref 11.5–15.5)

## 2012-02-10 SURGERY — LAPAROSCOPIC PARTIAL COLECTOMY
Anesthesia: General | Site: Abdomen | Wound class: Clean Contaminated

## 2012-02-10 MED ORDER — ALVIMOPAN 12 MG PO CAPS: 12.0000 mg | ORAL_CAPSULE | Freq: Two times a day (BID) | ORAL | Status: DC

## 2012-02-10 MED ORDER — SODIUM CHLORIDE 0.9 % IR SOLN
Status: DC | PRN
  Administered 2012-02-10: 1000 mL

## 2012-02-10 MED ORDER — ROCURONIUM BROMIDE 100 MG/10ML IV SOLN
INTRAVENOUS | Status: DC | PRN
  Administered 2012-02-10: 50 mg via INTRAVENOUS

## 2012-02-10 MED ORDER — HYDROMORPHONE HCL PF 1 MG/ML IJ SOLN
0.2500 mg | INTRAMUSCULAR | Status: DC | PRN
  Administered 2012-02-10 (×2): 0.5 mg via INTRAVENOUS

## 2012-02-10 MED ORDER — LACTATED RINGERS IV SOLN
INTRAVENOUS | Status: DC
  Administered 2012-02-10: 10:00:00 via INTRAVENOUS

## 2012-02-10 MED ORDER — PROPOFOL 10 MG/ML IV BOLUS
INTRAVENOUS | Status: DC | PRN
  Administered 2012-02-10: 100 mg via INTRAVENOUS

## 2012-02-10 MED ORDER — METRONIDAZOLE IN NACL 5-0.79 MG/ML-% IV SOLN
500.0000 mg | INTRAVENOUS | Status: DC
  Filled 2012-02-10: qty 100

## 2012-02-10 MED ORDER — VECURONIUM BROMIDE 10 MG IV SOLR
INTRAVENOUS | Status: DC | PRN
  Administered 2012-02-10: 1 mg via INTRAVENOUS
  Administered 2012-02-10: 3 mg via INTRAVENOUS

## 2012-02-10 MED ORDER — ARTIFICIAL TEARS OP OINT
TOPICAL_OINTMENT | OPHTHALMIC | Status: DC | PRN
  Administered 2012-02-10: 1 via OPHTHALMIC

## 2012-02-10 MED ORDER — SODIUM CHLORIDE 0.9 % IV SOLN
10.0000 mg | INTRAVENOUS | Status: DC | PRN
  Administered 2012-02-10: 15 ug/min via INTRAVENOUS

## 2012-02-10 MED ORDER — ALVIMOPAN 12 MG PO CAPS
12.0000 mg | ORAL_CAPSULE | Freq: Two times a day (BID) | ORAL | Status: DC
  Administered 2012-02-10 – 2012-02-14 (×10): 12 mg via ORAL
  Filled 2012-02-10 (×13): qty 1

## 2012-02-10 MED ORDER — HEPARIN (PORCINE) IN NACL 100-0.45 UNIT/ML-% IJ SOLN
1000.0000 [IU]/h | INTRAMUSCULAR | Status: DC
  Administered 2012-02-10 – 2012-02-11 (×2): 900 [IU]/h via INTRAVENOUS
  Filled 2012-02-10 (×3): qty 250

## 2012-02-10 MED ORDER — ONDANSETRON HCL 4 MG/2ML IJ SOLN: 4.0000 mg | Freq: Once | INTRAMUSCULAR | Status: DC | PRN

## 2012-02-10 MED ORDER — FENTANYL CITRATE 0.05 MG/ML IJ SOLN
INTRAMUSCULAR | Status: DC | PRN
  Administered 2012-02-10: 100 ug via INTRAVENOUS

## 2012-02-10 MED ORDER — CEFAZOLIN SODIUM 1-5 GM-% IV SOLN
INTRAVENOUS | Status: AC
  Filled 2012-02-10: qty 100

## 2012-02-10 MED ORDER — 0.9 % SODIUM CHLORIDE (POUR BTL) OPTIME
TOPICAL | Status: DC | PRN
  Administered 2012-02-10 (×2): 1000 mL

## 2012-02-10 MED ORDER — BUPIVACAINE-EPINEPHRINE PF 0.25-1:200000 % IJ SOLN
INTRAMUSCULAR | Status: AC
  Filled 2012-02-10: qty 30

## 2012-02-10 MED ORDER — LIDOCAINE HCL (PF) 1 % IJ SOLN
INTRAMUSCULAR | Status: AC
  Filled 2012-02-10: qty 30

## 2012-02-10 MED ORDER — KCL IN DEXTROSE-NACL 20-5-0.45 MEQ/L-%-% IV SOLN
INTRAVENOUS | Status: DC
  Administered 2012-02-10: 50 mL/h via INTRAVENOUS
  Filled 2012-02-10 (×3): qty 1000

## 2012-02-10 MED ORDER — CEFAZOLIN SODIUM 1-5 GM-% IV SOLN
INTRAVENOUS | Status: DC | PRN
  Administered 2012-02-10: 2 g via INTRAVENOUS

## 2012-02-10 MED ORDER — HYDROMORPHONE HCL PF 1 MG/ML IJ SOLN
INTRAMUSCULAR | Status: AC
  Filled 2012-02-10: qty 1

## 2012-02-10 MED ORDER — ONDANSETRON HCL 4 MG/2ML IJ SOLN
INTRAMUSCULAR | Status: DC | PRN
  Administered 2012-02-10: 4 mg via INTRAVENOUS

## 2012-02-10 MED ORDER — METRONIDAZOLE IN NACL 5-0.79 MG/ML-% IV SOLN
INTRAVENOUS | Status: DC | PRN
  Administered 2012-02-10: 500 mg via INTRAVENOUS

## 2012-02-10 MED ORDER — MORPHINE SULFATE 2 MG/ML IJ SOLN
1.0000 mg | INTRAMUSCULAR | Status: DC | PRN
  Administered 2012-02-10 – 2012-02-11 (×2): 2 mg via INTRAVENOUS
  Filled 2012-02-10 (×3): qty 1

## 2012-02-10 MED ORDER — LACTATED RINGERS IV SOLN
INTRAVENOUS | Status: DC | PRN
  Administered 2012-02-10 (×2): via INTRAVENOUS

## 2012-02-10 MED ORDER — LIDOCAINE HCL (CARDIAC) 20 MG/ML IV SOLN
INTRAVENOUS | Status: DC | PRN
  Administered 2012-02-10: 50 mg via INTRAVENOUS

## 2012-02-10 MED ORDER — LIDOCAINE HCL 1 % IJ SOLN
INTRAMUSCULAR | Status: DC | PRN
  Administered 2012-02-10: 12:00:00

## 2012-02-10 MED ORDER — EPHEDRINE SULFATE 50 MG/ML IJ SOLN
INTRAMUSCULAR | Status: DC | PRN
  Administered 2012-02-10: 10 mg via INTRAVENOUS

## 2012-02-10 SURGICAL SUPPLY — 88 items
APPLIER CLIP ROT 10 11.4 M/L (STAPLE)
APR CLP MED LRG 11.4X10 (STAPLE)
BLADE SURG 10 STRL SS (BLADE) ×2 IMPLANT
BLADE SURG ROTATE 9660 (MISCELLANEOUS) ×1 IMPLANT
CANISTER SUCTION 2500CC (MISCELLANEOUS) ×2 IMPLANT
CELLS DAT CNTRL 66122 CELL SVR (MISCELLANEOUS) IMPLANT
CHLORAPREP W/TINT 26ML (MISCELLANEOUS) ×2 IMPLANT
CLIP APPLIE ROT 10 11.4 M/L (STAPLE) IMPLANT
CLOTH BEACON ORANGE TIMEOUT ST (SAFETY) ×2 IMPLANT
COVER SURGICAL LIGHT HANDLE (MISCELLANEOUS) ×2 IMPLANT
DECANTER SPIKE VIAL GLASS SM (MISCELLANEOUS) IMPLANT
DISSECTOR BLUNT TIP ENDO 5MM (MISCELLANEOUS) IMPLANT
DRAPE PROXIMA HALF (DRAPES) IMPLANT
DRAPE UTILITY 15X26 W/TAPE STR (DRAPE) ×4 IMPLANT
DRAPE WARM FLUID 44X44 (DRAPE) ×2 IMPLANT
DRSG COVADERM 4X6 (GAUZE/BANDAGES/DRESSINGS) ×1 IMPLANT
ELECT CAUTERY BLADE 6.4 (BLADE) ×2 IMPLANT
ELECT REM PT RETURN 9FT ADLT (ELECTROSURGICAL) ×2
ELECTRODE REM PT RTRN 9FT ADLT (ELECTROSURGICAL) ×1 IMPLANT
GAUZE SPONGE 2X2 8PLY STRL LF (GAUZE/BANDAGES/DRESSINGS) IMPLANT
GEL ULTRASOUND 20GR AQUASONIC (MISCELLANEOUS) IMPLANT
GLOVE BIO SURGEON STRL SZ 6 (GLOVE) ×4 IMPLANT
GLOVE BIO SURGEON STRL SZ7.5 (GLOVE) ×2 IMPLANT
GLOVE BIOGEL PI IND STRL 6.5 (GLOVE) ×1 IMPLANT
GLOVE BIOGEL PI IND STRL 7.0 (GLOVE) IMPLANT
GLOVE BIOGEL PI INDICATOR 6.5 (GLOVE) ×1
GLOVE BIOGEL PI INDICATOR 7.0 (GLOVE) ×1
GLOVE ECLIPSE 6.5 STRL STRAW (GLOVE) ×1 IMPLANT
GLOVE SURG SS PI 6.5 STRL IVOR (GLOVE) ×1 IMPLANT
GLOVE SURG SS PI 7.0 STRL IVOR (GLOVE) ×1 IMPLANT
GOWN PREVENTION PLUS XXLARGE (GOWN DISPOSABLE) ×2 IMPLANT
GOWN STRL NON-REIN LRG LVL3 (GOWN DISPOSABLE) ×4 IMPLANT
KIT BASIN OR (CUSTOM PROCEDURE TRAY) ×2 IMPLANT
KIT ROOM TURNOVER OR (KITS) ×2 IMPLANT
LEGGING LITHOTOMY PAIR STRL (DRAPES) IMPLANT
LIGASURE 5MM LAPAROSCOPIC (INSTRUMENTS) IMPLANT
LIGASURE IMPACT 36 18CM CVD LR (INSTRUMENTS) IMPLANT
NS IRRIG 1000ML POUR BTL (IV SOLUTION) ×4 IMPLANT
PAD ARMBOARD 7.5X6 YLW CONV (MISCELLANEOUS) ×4 IMPLANT
PENCIL BUTTON HOLSTER BLD 10FT (ELECTRODE) ×2 IMPLANT
RELOAD PROXIMATE 75MM BLUE (ENDOMECHANICALS) ×4 IMPLANT
RETRACTOR WND ALEXIS 18 MED (MISCELLANEOUS) IMPLANT
RTRCTR WOUND ALEXIS 18CM MED (MISCELLANEOUS)
SCALPEL HARMONIC ACE (MISCELLANEOUS) IMPLANT
SCISSORS LAP 5X35 DISP (ENDOMECHANICALS) ×1 IMPLANT
SEALER TISSUE G2 STRG ARTC 35C (ENDOMECHANICALS) ×1 IMPLANT
SET IRRIG TUBING LAPAROSCOPIC (IRRIGATION / IRRIGATOR) ×1 IMPLANT
SLEEVE ENDOPATH XCEL 5M (ENDOMECHANICALS) ×4 IMPLANT
SPECIMEN JAR LARGE (MISCELLANEOUS) ×2 IMPLANT
SPONGE GAUZE 2X2 STER 10/PKG (GAUZE/BANDAGES/DRESSINGS) ×1
SPONGE GAUZE 4X4 12PLY (GAUZE/BANDAGES/DRESSINGS) ×2 IMPLANT
SPONGE LAP 18X18 X RAY DECT (DISPOSABLE) IMPLANT
STAPLER GUN LINEAR PROX 60 (STAPLE) ×1 IMPLANT
STAPLER PROXIMATE 75MM BLUE (STAPLE) ×1 IMPLANT
STAPLER VISISTAT 35W (STAPLE) ×2 IMPLANT
SUCTION POOLE TIP (SUCTIONS) ×2 IMPLANT
SURGILUBE 2OZ TUBE FLIPTOP (MISCELLANEOUS) IMPLANT
SUT PDS AB 1 CT  36 (SUTURE)
SUT PDS AB 1 CT 36 (SUTURE) IMPLANT
SUT PDS II 0 TP-1 LOOPED 60 (SUTURE) ×4 IMPLANT
SUT PROLENE 2 0 CT2 30 (SUTURE) IMPLANT
SUT PROLENE 2 0 KS (SUTURE) IMPLANT
SUT SILK 2 0 (SUTURE)
SUT SILK 2 0 SH CR/8 (SUTURE) IMPLANT
SUT SILK 2-0 18XBRD TIE 12 (SUTURE) ×1 IMPLANT
SUT SILK 3 0 (SUTURE)
SUT SILK 3 0 SH CR/8 (SUTURE) IMPLANT
SUT SILK 3-0 18XBRD TIE 12 (SUTURE) ×1 IMPLANT
SUT VIC AB 2-0 SH 27 (SUTURE) ×4
SUT VIC AB 2-0 SH 27XBRD (SUTURE) IMPLANT
SUT VIC AB 3-0 SH 8-18 (SUTURE) ×1 IMPLANT
SUT VICRYL AB 2 0 TIES (SUTURE) ×1 IMPLANT
SUT VICRYL AB 3 0 TIES (SUTURE) ×1 IMPLANT
SYS LAPSCP GELPORT 120MM (MISCELLANEOUS)
SYSTEM LAPSCP GELPORT 120MM (MISCELLANEOUS) IMPLANT
TOWEL OR 17X24 6PK STRL BLUE (TOWEL DISPOSABLE) ×2 IMPLANT
TOWEL OR 17X26 10 PK STRL BLUE (TOWEL DISPOSABLE) ×2 IMPLANT
TRAY FOLEY CATH 14FRSI W/METER (CATHETERS) ×2 IMPLANT
TRAY LAPAROSCOPIC (CUSTOM PROCEDURE TRAY) ×2 IMPLANT
TRAY PROCTOSCOPIC FIBER OPTIC (SET/KITS/TRAYS/PACK) IMPLANT
TROCAR XCEL 12X100 BLDLESS (ENDOMECHANICALS) IMPLANT
TROCAR XCEL BLUNT TIP 100MML (ENDOMECHANICALS) ×2 IMPLANT
TROCAR XCEL NON-BLD 11X100MML (ENDOMECHANICALS) IMPLANT
TROCAR XCEL NON-BLD 5MMX100MML (ENDOMECHANICALS) ×2 IMPLANT
TUBE CONNECTING 12X1/4 (SUCTIONS) ×2 IMPLANT
TUBING FILTER THERMOFLATOR (ELECTROSURGICAL) ×2 IMPLANT
WATER STERILE IRR 1000ML POUR (IV SOLUTION) IMPLANT
YANKAUER SUCT BULB TIP NO VENT (SUCTIONS) ×4 IMPLANT

## 2012-02-10 NOTE — Anesthesia Procedure Notes (Signed)
Procedure Name: Intubation Date/Time: 02/10/2012 10:57 AM Performed by: Carmela Rima Pre-anesthesia Checklist: Patient identified, Timeout performed, Emergency Drugs available, Suction available and Patient being monitored Patient Re-evaluated:Patient Re-evaluated prior to inductionOxygen Delivery Method: Circle system utilized Preoxygenation: Pre-oxygenation with 100% oxygen Intubation Type: IV induction Ventilation: Mask ventilation without difficulty Laryngoscope Size: Mac and 3 Grade View: Grade I Tube type: Oral Tube size: 7.5 mm Number of attempts: 1 Placement Confirmation: ETT inserted through vocal cords under direct vision,  positive ETCO2 and breath sounds checked- equal and bilateral Secured at: 22 cm Tube secured with: Tape Dental Injury: Teeth and Oropharynx as per pre-operative assessment

## 2012-02-10 NOTE — Progress Notes (Addendum)
SUBJECTIVE: doing well.  Denies chest pain or SOB  OBJECTIVE:   Vitals:   Filed Vitals:   02/09/12 1349 02/09/12 2137 02/10/12 0513 02/10/12 0823  BP: 115/54 121/59 115/54 124/39  Pulse: 70 69 70 63  Temp: 97.3 F (36.3 C) 97.1 F (36.2 C) 98.1 F (36.7 C) 98 F (36.7 C)  TempSrc: Oral Oral Oral Oral  Resp: 18 18 18 18   Height:      Weight:   66.724 kg (147 lb 1.6 oz)   SpO2: 100% 98% 99% 98%   I&O's:   Intake/Output Summary (Last 24 hours) at 02/10/12 0836 Last data filed at 02/10/12 1610  Gross per 24 hour  Intake    840 ml  Output   1750 ml  Net   -910 ml   TELEMETRY: Reviewed telemetry pt in atrial fibrillation     PHYSICAL EXAM General: Well developed, well nourished, in no acute distress Head: Eyes PERRLA, No xanthomas.   Normal cephalic and atramatic  Lungs:   Clear bilaterally to auscultation and percussion. Heart:   HRRR S1 S2 Pulses are 2+ & equal. Abdomen: Bowel sounds are positive, abdomen soft and non-tender without masses Extremities:   No clubbing, cyanosis or edema.  DP +1 Neuro: Alert and oriented X 3. Psych:  Good affect, responds appropriately   LABS: Basic Metabolic Panel:  Basename 02/08/12 0445  NA 142  K 4.5  CL 108  CO2 25  GLUCOSE 95  BUN 23  CREATININE 1.20  CALCIUM 9.3  MG --  PHOS --   CBC:  Basename 02/10/12 0450 02/09/12 0547  WBC 4.7 7.7  NEUTROABS -- --  HGB 9.0* 8.8*  HCT 29.6* 29.1*  MCV 76.1* 75.6*  PLT 200 213   Coag Panel:   Lab Results  Component Value Date   INR 1.32 02/08/2012   INR 1.31 02/07/2012   INR 1.59* 02/06/2012    RADIOLOGY: Dg Chest 2 View  02/03/2012  *RADIOLOGY REPORT*  Clinical Data: Short of breath.  Weakness.  CHEST - 2 VIEW  Comparison: None.  Findings: Cardiomegaly.  Pulmonary vascular congestion.  Elevation of the left hemidiaphragm.  Left basilar atelectasis.  No focal consolidation.  Mitral valve replacement.  Interstitial pulmonary edema is present.  IMPRESSION: Cardiomegaly,  pulmonary vascular congestion and interstitial pulmonary edema compatible with mild CHF.   Original Report Authenticated By: Andreas Newport, M.D.    Ct Chest W Contrast  02/09/2012  *RADIOLOGY REPORT*  Clinical Data:  Status post colonoscopy 02/08/2012 where a circumferential ulcerated mass was identified transverse colon.  CT CHEST, ABDOMEN AND PELVIS WITH CONTRAST  Technique:  Multidetector CT imaging of the chest, abdomen and pelvis was performed following the standard protocol during bolus administration of intravenous contrast.  Contrast: OMNIPAQUE IOHEXOL 300 MG/ML  SOLN  Comparison:  Plain films of the chest 02/03/2012.  CT CHEST  Findings:  There is no axillary, hilar or mediastinal lymphadenopathy.  Trace pleural fluid on the left is noted.  There is no right pleural effusion or pericardial effusion.  Massive cardiomegaly is identified.  The patient is status post CABG and mitral valve replacement.  No pulmonary nodule or mass is identified.  There is some mild dependent atelectasis.  No focal bony abnormality is identified.  IMPRESSION:  1.  Negative for metastatic or acute disease. 2.  Massive cardiomegaly.  CT ABDOMEN AND PELVIS  Findings:  The liver, adrenal glands, spleen, pancreas and kidneys all appear normal.  Multiple stones are seen within  the gallbladder but there is no CT evidence of cholecystitis.  The patient has a left inguinal hernia containing a loop of descending colon.  No obstruction or other complicating feature is identified.  Scattered colonic diverticula without diverticulitis are noted.  Focal wall thickening is seen in the transverse colon just distal to the hepatic flexure which may represent the patient's mass seen on colonoscopy. The walls of the rectosigmoid colon and cecum are thickened although these loops are under distended.  Stomach, small bowel and appendix are normal in appearance.  The prostate gland is mildly enlarged.  Urinary bladder appears normal.  There  is no lymphadenopathy or fluid.  No focal bony abnormality is identified.  IMPRESSION:  1.  Negative for acute or metastatic disease. 2.  Wall thickening in the transverse colon just distal to the hepatic flexure may represent the patient's known colon mass.  Wall thickening of the cecum and rectosigmoid colon is likely due to under distention.  3.  Mild enlargement of the prostate gland. 4.  Diverticulosis without diverticulitis. 5.  Left inguinal hernia contains a knuckle of colon without obstruction or other complicating feature. 6.  Gallstones without cholecystitis.   Original Report Authenticated By: Holley Dexter, M.D.    Ct Abdomen Pelvis W Contrast  02/09/2012  *RADIOLOGY REPORT*  Clinical Data:  Status post colonoscopy 02/08/2012 where a circumferential ulcerated mass was identified transverse colon.  CT CHEST, ABDOMEN AND PELVIS WITH CONTRAST  Technique:  Multidetector CT imaging of the chest, abdomen and pelvis was performed following the standard protocol during bolus administration of intravenous contrast.  Contrast: OMNIPAQUE IOHEXOL 300 MG/ML  SOLN  Comparison:  Plain films of the chest 02/03/2012.  CT CHEST  Findings:  There is no axillary, hilar or mediastinal lymphadenopathy.  Trace pleural fluid on the left is noted.  There is no right pleural effusion or pericardial effusion.  Massive cardiomegaly is identified.  The patient is status post CABG and mitral valve replacement.  No pulmonary nodule or mass is identified.  There is some mild dependent atelectasis.  No focal bony abnormality is identified.  IMPRESSION:  1.  Negative for metastatic or acute disease. 2.  Massive cardiomegaly.  CT ABDOMEN AND PELVIS  Findings:  The liver, adrenal glands, spleen, pancreas and kidneys all appear normal.  Multiple stones are seen within the gallbladder but there is no CT evidence of cholecystitis.  The patient has a left inguinal hernia containing a loop of descending colon.  No obstruction or  other complicating feature is identified.  Scattered colonic diverticula without diverticulitis are noted.  Focal wall thickening is seen in the transverse colon just distal to the hepatic flexure which may represent the patient's mass seen on colonoscopy. The walls of the rectosigmoid colon and cecum are thickened although these loops are under distended.  Stomach, small bowel and appendix are normal in appearance.  The prostate gland is mildly enlarged.  Urinary bladder appears normal.  There is no lymphadenopathy or fluid.  No focal bony abnormality is identified.  IMPRESSION:  1.  Negative for acute or metastatic disease. 2.  Wall thickening in the transverse colon just distal to the hepatic flexure may represent the patient's known colon mass.  Wall thickening of the cecum and rectosigmoid colon is likely due to under distention.  3.  Mild enlargement of the prostate gland. 4.  Diverticulosis without diverticulitis. 5.  Left inguinal hernia contains a knuckle of colon without obstruction or other complicating feature. 6.  Gallstones  without cholecystitis.   Original Report Authenticated By: Holley Dexter, M.D.    Assessment/Plan:  Principal Problem:  *CHF (congestive heart failure)  Active Problems:  Chronic a-fib  ARF (acute renal failure)  HTN (hypertension)  S/P MVR (mitral valve replacement)  GIB (gastrointestinal bleeding)  Iron deficiency anemia  Acute on chronic systolic CHF (congestive heart failure)  Non-sustained ventricular tachycardia  Colonic mass most likely CA  78 year old male who was admitted with anemia, status post transfusion, GI bleed with mechanical St. Jude mitral valve, chronic anticoagulation, hypertension, persistent chronic atrial fibrillation fairly well rate controlled with chronic lower extremity edema consistent with acute on chronic diastolic/systolic heart failure, past EF 40% now 20-25%  1. Mechanical mitral valve-heparin IV.  No coumadin for now until  surgery  2. Atrial fibrillation-persistent rate controlled.  3. Acute on chronic diastolic/systolic heart failure-prior ejection fraction in the 40% range now 20-25% -appears compensated. 4. Anemia/GI bleed-per primary team/GI. colonoscopy today with transverse colon mass worrisome for CA - surgery today. No coumadin until surgery 5. Non sustained VT - brief. Metoprolol. Asymptomatic. Keep K > 4. This is expected with EF 20-25%. Would not be candidate for ICD due to age.  6. Colonic mass - very difficult situation. He needs mass removed otherwise restarting coumadin for his MVR will be difficult giving ongoing bleeding from his mass. If is not on chronic anticoagulation his risk of valve thrombosis is extremely high. He is at least moderate risk if not high risk for surgery from a cardiac standpoint due to severe LV dysfunction. His risk includes the risk of CHF exacerbation as well as intraop or periop infarct since we do not know if he has CAD that has resulted in decline in his EF. If ultimately it is determined that he is a candidate for surgery ( no evidence of metastatic disease) then would recommend use of intraop IV NTG to maximize coronary blood flow and also avoid fluid overload. He currently is compensated with his CHF and heart rate is controlled. Risks discussed with family members and they understand.         Quintella Reichert, MD  02/10/2012  8:36 AM

## 2012-02-10 NOTE — Transfer of Care (Signed)
Immediate Anesthesia Transfer of Care Note  Patient: Alfred Dennis  Procedure(s) Performed: Procedure(s) (LRB) with comments: LAPAROSCOPIC PARTIAL COLECTOMY (N/A)  Patient Location: PACU  Anesthesia Type:General  Level of Consciousness: awake  Airway & Oxygen Therapy: Patient Spontanous Breathing and Patient connected to nasal cannula oxygen  Post-op Assessment: Report given to PACU RN, Post -op Vital signs reviewed and stable and Patient moving all extremities X 4  Post vital signs: Reviewed and stable  Complications: No apparent anesthesia complications

## 2012-02-10 NOTE — Progress Notes (Signed)
02/10/11 @ 0355 heparin drip discontinued as per Dr Donell Beers order

## 2012-02-10 NOTE — Progress Notes (Deleted)
Utilization review completed.  

## 2012-02-10 NOTE — Anesthesia Postprocedure Evaluation (Signed)
  Anesthesia Post-op Note  Patient: Alfred Dennis  Procedure(s) Performed: Procedure(s) (LRB) with comments: LAPAROSCOPIC PARTIAL COLECTOMY (N/A)  Patient Location: PACU  Anesthesia Type:General  Level of Consciousness: awake, oriented, sedated and patient cooperative  Airway and Oxygen Therapy: Patient Spontanous Breathing  Post-op Pain: mild  Post-op Assessment: Post-op Vital signs reviewed, Patient's Cardiovascular Status Stable, Respiratory Function Stable, Patent Airway, No signs of Nausea or vomiting and Pain level controlled  Post-op Vital Signs: stable  Complications: No apparent anesthesia complications

## 2012-02-10 NOTE — Anesthesia Preprocedure Evaluation (Addendum)
Anesthesia Evaluation  Patient identified by MRN, date of birth, ID band Patient awake    Reviewed: Allergy & Precautions, H&P , NPO status , Patient's Chart, lab work & pertinent test results  Airway Mallampati: I TM Distance: >3 FB Neck ROM: full    Dental  (+) Partial Upper, Poor Dentition and Dental Advidsory Given   Pulmonary shortness of breath and at rest, COPDformer smoker,          Cardiovascular hypertension, + Peripheral Vascular Disease and +CHF + dysrhythmias Atrial Fibrillation Rhythm:irregular Rate:Normal     Neuro/Psych    GI/Hepatic   Endo/Other    Renal/GU ARFRenal disease     Musculoskeletal   Abdominal   Peds  Hematology  (+) anemia ,   Anesthesia Other Findings   Reproductive/Obstetrics                          Anesthesia Physical Anesthesia Plan  ASA: III  Anesthesia Plan: General   Post-op Pain Management:    Induction: Intravenous  Airway Management Planned: Oral ETT  Additional Equipment:   Intra-op Plan:   Post-operative Plan: Extubation in OR  Informed Consent: I have reviewed the patients History and Physical, chart, labs and discussed the procedure including the risks, benefits and alternatives for the proposed anesthesia with the patient or authorized representative who has indicated his/her understanding and acceptance.   Dental Advisory Given  Plan Discussed with: CRNA, Anesthesiologist and Surgeon  Anesthesia Plan Comments:        Anesthesia Quick Evaluation

## 2012-02-10 NOTE — Progress Notes (Signed)
Patient ID: Alfred Dennis, male   DOB: [birth year], 78 y.o.   MRN: [id number] 2 Days Post-Op  Subjective: Resting quietly, wife and daughter at bedside. He appears to be in no acute distress and offers no new c/o this morning. Awaiting surgery later this morning.  Objective: Vital signs in last 24 hours: Temp:  [97.1 F (36.2 C)-98.1 F (36.7 C)] 98.1 F (36.7 C) (12/04 0513) Pulse Rate:  [69-70] 70  (12/04 0513) Resp:  [18] 18  (12/04 0513) BP: (115-132)/(54-72) 115/54 mmHg (12/04 0513) SpO2:  [98 %-100 %] 99 % (12/04 0513) Weight:  [147 lb 1.6 oz (66.724 kg)] 147 lb 1.6 oz (66.724 kg) (12/04 0513) Last BM Date: 02/08/12  Intake/Output from previous day: 12/03 0701 - 12/04 0700 In: 840 [P.O.:840] Out: 1750 [Urine:1750] Intake/Output this shift:    General appearance: alert, cooperative, appears stated age and no distress Chest: CTA bilaterally Cardiac: RRR  Abdomen: soft, + BS, non tender Extremities: warm to touch, no edema or tenderness, + pulses Labs: wbc wnl, H&H stable, PLTs down slightly.   Lab Results:   [facility] 02/10/12 0450 02/09/12 0547  WBC 4.7 7.7  HGB 9.0* 8.8*  HCT 29.6* 29.1*  PLT 200 213   BMET  Basename 02/08/12 0445  NA 142  K 4.5  CL 108  CO2 25  GLUCOSE 95  BUN 23  CREATININE 1.20  CALCIUM 9.3   PT/INR  Basename 02/08/12 0445  LABPROT 16.1*  INR 1.32   ABG No results found for this basename: PHART:2,PCO2:2,PO2:2,HCO3:2 in the last 72 hours  Studies/Results: Ct Chest W Contrast  02/09/2012  *RADIOLOGY REPORT*  Clinical Data:  Status post colonoscopy 02/08/2012 where a circumferential ulcerated mass was identified transverse colon.  CT CHEST, ABDOMEN AND PELVIS WITH CONTRAST  Technique:  Multidetector CT imaging of the chest, abdomen and pelvis was performed following the standard protocol during bolus administration of intravenous contrast.  Contrast: OMNIPAQUE IOHEXOL 300 MG/ML  SOLN  Comparison:  Plain films of the chest  02/03/2012.  CT CHEST  Findings:  There is no axillary, hilar or mediastinal lymphadenopathy.  Trace pleural fluid on the left is noted.  There is no right pleural effusion or pericardial effusion.  Massive cardiomegaly is identified.  The patient is status post CABG and mitral valve replacement.  No pulmonary nodule or mass is identified.  There is some mild dependent atelectasis.  No focal bony abnormality is identified.  IMPRESSION:  1.  Negative for metastatic or acute disease. 2.  Massive cardiomegaly.  CT ABDOMEN AND PELVIS  Findings:  The liver, adrenal glands, spleen, pancreas and kidneys all appear normal.  Multiple stones are seen within the gallbladder but there is no CT evidence of cholecystitis.  The patient has a left inguinal hernia containing a loop of descending colon.  No obstruction or other complicating feature is identified.  Scattered colonic diverticula without diverticulitis are noted.  Focal wall thickening is seen in the transverse colon just distal to the hepatic flexure which may represent the patient's mass seen on colonoscopy. The walls of the rectosigmoid colon and cecum are thickened although these loops are under distended.  Stomach, small bowel and appendix are normal in appearance.  The prostate gland is mildly enlarged.  Urinary bladder appears normal.  There is no lymphadenopathy or fluid.  No focal bony abnormality is identified.  IMPRESSION:  1.  Negative for acute or metastatic disease. 2.  Wall thickening in the transverse colon just distal to the  hepatic flexure may represent the patient's known colon mass.  Wall thickening of the cecum and rectosigmoid colon is likely due to under distention.  3.  Mild enlargement of the prostate gland. 4.  Diverticulosis without diverticulitis. 5.  Left inguinal hernia contains a knuckle of colon without obstruction or other complicating feature. 6.  Gallstones without cholecystitis.   Original Report Authenticated By: Holley Dexter,  M.D.    Ct Abdomen Pelvis W Contrast  02/09/2012  *RADIOLOGY REPORT*  Clinical Data:  Status post colonoscopy 02/08/2012 where a circumferential ulcerated mass was identified transverse colon.  CT CHEST, ABDOMEN AND PELVIS WITH CONTRAST  Technique:  Multidetector CT imaging of the chest, abdomen and pelvis was performed following the standard protocol during bolus administration of intravenous contrast.  Contrast: OMNIPAQUE IOHEXOL 300 MG/ML  SOLN  Comparison:  Plain films of the chest 02/03/2012.  CT CHEST  Findings:  There is no axillary, hilar or mediastinal lymphadenopathy.  Trace pleural fluid on the left is noted.  There is no right pleural effusion or pericardial effusion.  Massive cardiomegaly is identified.  The patient is status post CABG and mitral valve replacement.  No pulmonary nodule or mass is identified.  There is some mild dependent atelectasis.  No focal bony abnormality is identified.  IMPRESSION:  1.  Negative for metastatic or acute disease. 2.  Massive cardiomegaly.  CT ABDOMEN AND PELVIS  Findings:  The liver, adrenal glands, spleen, pancreas and kidneys all appear normal.  Multiple stones are seen within the gallbladder but there is no CT evidence of cholecystitis.  The patient has a left inguinal hernia containing a loop of descending colon.  No obstruction or other complicating feature is identified.  Scattered colonic diverticula without diverticulitis are noted.  Focal wall thickening is seen in the transverse colon just distal to the hepatic flexure which may represent the patient's mass seen on colonoscopy. The walls of the rectosigmoid colon and cecum are thickened although these loops are under distended.  Stomach, small bowel and appendix are normal in appearance.  The prostate gland is mildly enlarged.  Urinary bladder appears normal.  There is no lymphadenopathy or fluid.  No focal bony abnormality is identified.  IMPRESSION:  1.  Negative for acute or metastatic disease.  2.  Wall thickening in the transverse colon just distal to the hepatic flexure may represent the patient's known colon mass.  Wall thickening of the cecum and rectosigmoid colon is likely due to under distention.  3.  Mild enlargement of the prostate gland. 4.  Diverticulosis without diverticulitis. 5.  Left inguinal hernia contains a knuckle of colon without obstruction or other complicating feature. 6.  Gallstones without cholecystitis.   Original Report Authenticated By: Holley Dexter, M.D.     Anti-infectives: Anti-infectives    None      Assessment/Plan: Patient Active Problem List  Diagnosis  . Chronic a-fib  . CHF (congestive heart failure)  . ARF (acute renal failure)  . HTN (hypertension)  . S/P MVR (mitral valve replacement)  . GIB (gastrointestinal bleeding)  . Iron deficiency anemia  . Acute on chronic systolic CHF (congestive heart failure)  . Non-sustained ventricular tachycardia  . Carcinoma of colon  . Benign neoplasm of colon  . Gastritis  . Colonic mass  s/p Procedure(s) (LRB) with comments: ESOPHAGOGASTRODUODENOSCOPY (EGD) (N/A) COLONOSCOPY (N/A)   Plan:  1. NPO in anticipation of surgery later today. 2. Cont IVF, Heparin drip has been stopped.  Probable right hemicolectomy to level of  cecum tomorrow for transverse colon mass, with sessile polyps in cecum. 2. Management of his other medical needs per medicine team.   LOS: 7 days    Golda Acre [facility] Surgery Pager # [phone] 02/10/2012

## 2012-02-10 NOTE — Progress Notes (Addendum)
[facility] PROGRESS NOTE  Alfred Dennis UXL:[id number] DOB: 02-07-1924 DOA: 02/03/2012 PCP: No primary provider on file.  Assessment/Plan: Acute on chronic systolic CHF  -EF-25-30  -Furosemide changed to po--continue  -Appreciate cardiology recommendations  -Continue Toprol-XL  -Daily weights, strict I.'s and O.'s  -neg 8284cc for the admission  -TSH 3.922  - cards following  Transverse colon mass  -Noted on colonoscopy  -s/p R-hemicolectomy 12/4  NSVTach  -no further epsiodes with increase of metoprolol  -due to cardiomyopathy  -continue metoprolol   Blood loss Anemia/Iron deficiency  -Transfused 2 units PRBCs 11/27--hemoglobin remained stable  -appreciate GI followup  -Hold warfarin  -With high risk of thrombosis due to St. Jude MVR, to resume heparin gtt this evening following surgery. -continue iron supplementation   Chronic atrial fibrillation/history of St. Jude mitral valve replacement  -Continue metoprolol succinate-dose increased  -Rate controlled currently  -coumadin on hold, IV heparin to be resumed in few hours  Venous stasis dermatitis-legs  -No need for antibiotics for his legs   Procedures:  Colonoscopy and EGD 02/08/2012    Family Communication:   Daughter at beside Disposition Plan:   Home Vs ST-SNF when medically stable      Procedures/Studies: Dg Chest 2 View  02/03/2012  *RADIOLOGY REPORT*  Clinical Data: Short of breath.  Weakness.  CHEST - 2 VIEW  Comparison: None.  Findings: Cardiomegaly.  Pulmonary vascular congestion.  Elevation of the left hemidiaphragm.  Left basilar atelectasis.  No focal consolidation.  Mitral valve replacement.  Interstitial pulmonary edema is present.  IMPRESSION: Cardiomegaly, pulmonary vascular congestion and interstitial pulmonary edema compatible with mild CHF.   Original Report Authenticated By: Andreas Newport, M.D.    Ct Chest W Contrast  02/09/2012  *RADIOLOGY REPORT*  Clinical Data:  Status  post colonoscopy 02/08/2012 where a circumferential ulcerated mass was identified transverse colon.  CT CHEST, ABDOMEN AND PELVIS WITH CONTRAST  Technique:  Multidetector CT imaging of the chest, abdomen and pelvis was performed following the standard protocol during bolus administration of intravenous contrast.  Contrast: OMNIPAQUE IOHEXOL 300 MG/ML  SOLN  Comparison:  Plain films of the chest 02/03/2012.  CT CHEST  Findings:  There is no axillary, hilar or mediastinal lymphadenopathy.  Trace pleural fluid on the left is noted.  There is no right pleural effusion or pericardial effusion.  Massive cardiomegaly is identified.  The patient is status post CABG and mitral valve replacement.  No pulmonary nodule or mass is identified.  There is some mild dependent atelectasis.  No focal bony abnormality is identified.  IMPRESSION:  1.  Negative for metastatic or acute disease. 2.  Massive cardiomegaly.  CT ABDOMEN AND PELVIS  Findings:  The liver, adrenal glands, spleen, pancreas and kidneys all appear normal.  Multiple stones are seen within the gallbladder but there is no CT evidence of cholecystitis.  The patient has a left inguinal hernia containing a loop of descending colon.  No obstruction or other complicating feature is identified.  Scattered colonic diverticula without diverticulitis are noted.  Focal wall thickening is seen in the transverse colon just distal to the hepatic flexure which may represent the patient's mass seen on colonoscopy. The walls of the rectosigmoid colon and cecum are thickened although these loops are under distended.  Stomach, small bowel and appendix are normal in appearance.  The prostate gland is mildly enlarged.  Urinary bladder appears normal.  There is no lymphadenopathy or fluid.  No focal bony abnormality is identified.  IMPRESSION:  1.  Negative for acute or metastatic disease. 2.  Wall thickening in the transverse colon just distal to the hepatic flexure may represent  the patient's known colon mass.  Wall thickening of the cecum and rectosigmoid colon is likely due to under distention.  3.  Mild enlargement of the prostate gland. 4.  Diverticulosis without diverticulitis. 5.  Left inguinal hernia contains a knuckle of colon without obstruction or other complicating feature. 6.  Gallstones without cholecystitis.   Original Report Authenticated By: Holley Dexter, M.D.    Ct Abdomen Pelvis W Contrast  02/09/2012  *RADIOLOGY REPORT*  Clinical Data:  Status post colonoscopy 02/08/2012 where a circumferential ulcerated mass was identified transverse colon.  CT CHEST, ABDOMEN AND PELVIS WITH CONTRAST  Technique:  Multidetector CT imaging of the chest, abdomen and pelvis was performed following the standard protocol during bolus administration of intravenous contrast.  Contrast: OMNIPAQUE IOHEXOL 300 MG/ML  SOLN  Comparison:  Plain films of the chest 02/03/2012.  CT CHEST  Findings:  There is no axillary, hilar or mediastinal lymphadenopathy.  Trace pleural fluid on the left is noted.  There is no right pleural effusion or pericardial effusion.  Massive cardiomegaly is identified.  The patient is status post CABG and mitral valve replacement.  No pulmonary nodule or mass is identified.  There is some mild dependent atelectasis.  No focal bony abnormality is identified.  IMPRESSION:  1.  Negative for metastatic or acute disease. 2.  Massive cardiomegaly.  CT ABDOMEN AND PELVIS  Findings:  The liver, adrenal glands, spleen, pancreas and kidneys all appear normal.  Multiple stones are seen within the gallbladder but there is no CT evidence of cholecystitis.  The patient has a left inguinal hernia containing a loop of descending colon.  No obstruction or other complicating feature is identified.  Scattered colonic diverticula without diverticulitis are noted.  Focal wall thickening is seen in the transverse colon just distal to the hepatic flexure which may represent the  patient's mass seen on colonoscopy. The walls of the rectosigmoid colon and cecum are thickened although these loops are under distended.  Stomach, small bowel and appendix are normal in appearance.  The prostate gland is mildly enlarged.  Urinary bladder appears normal.  There is no lymphadenopathy or fluid.  No focal bony abnormality is identified.  IMPRESSION:  1.  Negative for acute or metastatic disease. 2.  Wall thickening in the transverse colon just distal to the hepatic flexure may represent the patient's known colon mass.  Wall thickening of the cecum and rectosigmoid colon is likely due to under distention.  3.  Mild enlargement of the prostate gland. 4.  Diverticulosis without diverticulitis. 5.  Left inguinal hernia contains a knuckle of colon without obstruction or other complicating feature. 6.  Gallstones without cholecystitis.   Original Report Authenticated By: Holley Dexter, M.D.          Subjective: just back from PACU, feels ok, some abd discomfort   Objective: Filed Vitals:   02/10/12 1445 02/10/12 1500 02/10/12 1515 02/10/12 1545  BP: 107/46 119/45 119/53 129/48  Pulse:      Temp:    97.4 F (36.3 C)  TempSrc:      Resp:      Height:      Weight:      SpO2:        Intake/Output Summary (Last 24 hours) at 02/10/12 1647 Last data filed at 02/10/12 1245  Gross per 24 hour  Intake  1420 ml  Output    575 ml  Net    845 ml   Weight change: -0.59 kg (-1 lb 4.8 oz) Exam:   General:  Pt is alert, follows commands appropriately, not in acute distress  HEENT: No icterus, No thrush, No neck mass, [place]/AT  Cardiovascular: RRR, S1/S2, no rubs, no gallops  Respiratory: CTA bilaterally, no wheezing, no crackles, no rhonchi  Abdomen: Soft/+BS, non tender, non distended, no guarding  Extremities: No edema, No lymphangitis, No petechiae, No rashes, no synovitis  Data Reviewed: Basic Metabolic Panel:  Lab 02/08/12 1478 02/06/12 0515 02/05/12 0310 02/04/12 0455   NA 142 140 139 140  K 4.5 3.5 3.5 4.3  CL 108 106 104 108  CO2 25 28 26 25   GLUCOSE 95 92 114* 92  BUN 23 23 22 20   CREATININE 1.20 1.34 1.25 1.18  CALCIUM 9.3 8.6 8.7 9.1  MG -- -- -- --  PHOS -- -- -- --   Liver Function Tests: No results found for this basename: AST:5,ALT:5,ALKPHOS:5,BILITOT:5,PROT:5,ALBUMIN:5 in the last 168 hours No results found for this basename: LIPASE:5,AMYLASE:5 in the last 168 hours No results found for this basename: AMMONIA:5 in the last 168 hours CBC:  Lab 02/10/12 0450 02/09/12 0547 02/08/12 0445 02/07/12 0545 02/06/12 0515  WBC 4.7 7.7 5.4 5.9 5.3  NEUTROABS -- -- -- -- --  HGB 9.0* 8.8* 8.9* 8.0* 8.0*  HCT 29.6* 29.1* 29.4* 26.4* 25.9*  MCV 76.1* 75.6* 75.2* 74.4* 74.6*  PLT 200 213 225 211 197   Cardiac Enzymes:  Lab 02/04/12 0445 02/03/12 2158  CKTOTAL -- --  CKMB -- --  CKMBINDEX -- --  TROPONINI <0.30 <0.30   BNP: No components found with this basename: POCBNP:5 CBG:  Lab 02/08/12 1112  GLUCAP 116*    Recent Results (from the past 240 hour(s))  SURGICAL PCR SCREEN     Status: Normal   Collection Time   02/10/12  5:14 AM      Component Value Range Status Comment   MRSA, PCR NEGATIVE  NEGATIVE Final    Staphylococcus aureus NEGATIVE  NEGATIVE Final      Scheduled Meds:    . alvimopan  12 mg Oral BID  . furosemide  40 mg Oral Daily  . HYDROmorphone      . metoprolol succinate  50 mg Oral Daily  . pantoprazole  40 mg Oral QAC lunch  . simvastatin  10 mg Oral q1800  . sodium chloride  3 mL Intravenous Q12H  . [DISCONTINUED] alvimopan  12 mg Oral BID  . [DISCONTINUED] ferrous sulfate  325 mg Oral TID WC  . [DISCONTINUED] metronidazole  500 mg Intravenous To OR   Continuous Infusions:    . dextrose 5 % and 0.45 % NaCl with KCl 20 mEq/L    . [DISCONTINUED] heparin 900 Units/hr (02/09/12 2327)  . [DISCONTINUED] lactated ringers 50 mL/hr at 02/10/12 2956     Zannie Cove, MD  [facility] Pager  [phone]  If 7PM-7AM, please contact night-coverage [email or web address] Password [email or web address] 02/10/2012, 4:47 PM   LOS: 7 days

## 2012-02-10 NOTE — Progress Notes (Signed)
No further questions. Reviewed plan with patient.  Plan partial colectomy today.

## 2012-02-10 NOTE — Progress Notes (Signed)
ANTICOAGULATION CONSULT NOTE - Follow Up Consult  Pharmacy Consult for heparin Indication: chronic afib, St. Jude mitral valve  No Known Allergies  Patient Measurements: Height: 5\' 7"  (170.2 cm) Weight: 147 lb 1.6 oz (66.724 kg) (scale b) IBW/kg (Calculated) : 66.1  Heparin Dosing Weight: 75 kg  Vital Signs: Temp: 97.4 F (36.3 C) (12/04 1545) Temp src: Oral (12/04 0823) BP: 129/48 mmHg (12/04 1545) Pulse Rate: 63  (12/04 0823)  Labs:  Basename 02/10/12 0450 02/09/12 1632 02/09/12 0547 02/08/12 0445  HGB 9.0* -- 8.8* --  HCT 29.6* -- 29.1* 29.4*  PLT 200 -- 213 225  APTT -- -- -- --  LABPROT -- -- -- 16.1*  INR -- -- -- 1.32  HEPARINUNFRC 0.14* 0.42 0.18* --  CREATININE -- -- -- 1.20  CKTOTAL -- -- -- --  CKMB -- -- -- --  TROPONINI -- -- -- --    Estimated Creatinine Clearance: 42.1 ml/min (by C-G formula based on Cr of 1.2).   Medications:  Scheduled:    . alvimopan  12 mg Oral BID  . furosemide  40 mg Oral Daily  . HYDROmorphone      . metoprolol succinate  50 mg Oral Daily  . pantoprazole  40 mg Oral QAC lunch  . simvastatin  10 mg Oral q1800  . sodium chloride  3 mL Intravenous Q12H  . [DISCONTINUED] alvimopan  12 mg Oral BID  . [DISCONTINUED] ferrous sulfate  325 mg Oral TID WC  . [DISCONTINUED] metronidazole  500 mg Intravenous To OR    Assessment: 78 yr old male now post op  bowel surgery. To be restarted on heparin for afib and St. Jude mitral valve at 7:45 PM per MD with no bolus. Will start with previously therapeutic rate and f/u HL. Goal of Therapy:  Heparin level 0.3-0.7 units/ml Monitor platelets by anticoagulation protocol: Yes   Plan:  Will restart hepairn at 900 units/hr at 7:45 PM as requested by MD. Will check heparin level with AM labs.  Eugene Garnet 02/10/2012,5:28 PM

## 2012-02-10 NOTE — Progress Notes (Signed)
Quick Note:  Patient aware of colon cancer Had resection No recall due to age No letter ______

## 2012-02-10 NOTE — Progress Notes (Signed)
Utilization review completed.  

## 2012-02-10 NOTE — Op Note (Signed)
Extended Laparoscopic Right Hemicolectomy Procedure Note  Indications: This patient presents for a laparoscopic partial colectomy for transverse colon cancer and sessile cecal polyps  Pre-operative Diagnosis:  See above   Post-operative Diagnosis:  Same  Surgeon: Almond Lint   Assistants: Barnetta Chapel, PA-C  Anesthesia: General endotracheal anesthesia and Local anesthesia 1% buffered lidocaine, 0.25.% bupivacaine, with epinephrine  ASA Class: 3  Procedure Details  The patient was seen in the Holding Room. The risks, benefits, complications, treatment options, and expected outcomes were discussed with the patient. The possibilities of reaction to medication, perforation of viscus, bleeding, recurrent infection, finding a normal colon, the need for additional procedures, failure to diagnose a condition, and creating a complication requiring transfusion or operation were discussed with the patient. The patient was advised of the risk of ostomy.  The patient concurred with the proposed plan, giving informed consent.   The patient was taken to the operating room, identified, and the procedure verified as partial colectomy. A Time Out was held and the above information confirmed.  The patient was brought to the operating room and placed supine. After induction of a general anesthetic, a Foley catheter was inserted and the abdomen was prepped and draped in standard fashion. The patient was placed into reverse the lumbar position and rotated to the right. A small incision at the costal margin was made with a #11 blade. Local anesthetic was introduced.  The 5 mm Optiview trocar was placed under direct visualization.  Pneumoperitoneum was insufflated to a pressure of 15 mm Hg. The laparoscope was introduced.    Exploration revealed a normal omentum, colon, small bowel, peritoneum, liver, and stomach. Two 5 mm left sided 5-mm trocars were then placed after anesthetizing the skin and peritoneum with  local anesthetic. The ascending colon and hepatic flexure were then mobilized with gentle retraction of the colon in a medial direction with mobilization of the peritoneal reflection with cautery and the EnSeal.  The omentum was taken off the transverse colon. Mobilization of this area was complete to expose the retroperitoneum.  There was minimal blood loss during this portion of the procedure.      The colon was evaluated to make sure it was adequately mobilized.  A 6 cm vertical incision was made above and below the umbilicus.  The subcutaneous tissues were divided with the cautery.  The fascia was divided with the cautery as well. The fascial incision was carried out past the point of the skin incision. The protractor was placed in the abdomen to protect the skin. The colon was delivered through the incision.  The terminal ileum and the colon were resected with a linear stapling device proximal and distal to the area in question in regard to the specimen. The mesenteric vessels were clamped and ligated. The tissues were extremely friable and several areas required suture ligation.The specimen was submitted to pathology.      An side-to-side, functional end-to-end anastomosis was performed through the small anterior incision with the linear stapling device. The mucosa was inspected and found to be hemostatic. Closure was achieved with the linear stapling device. A 3-0 vicryl suture was used to reapproximate the angle of the anastomosis. Hemostasis was confirmed. The bowel anastomosis was returned.  The abdomen was irrigated.    The fascial incision was then closed with a running 0 looped PDS suture. Skin was closed with staples.  Soft dressings were applied.  Instrument, sponge, and needle counts were correct prior to abdominal closure and at the conclusion of  the case.   Findings: Firm gallbladder  Estimated Blood Loss: minimal         Drains: none  Specimens: extended right colectomy             Complications: None; patient tolerated the procedure well.         Disposition: PACU - hemodynamically stable.

## 2012-02-10 NOTE — Preoperative (Signed)
Beta Blockers   Reason not to administer Beta Blockers:Not Applicable 

## 2012-02-11 ENCOUNTER — Encounter (HOSPITAL_COMMUNITY): Payer: Self-pay | Admitting: General Surgery

## 2012-02-11 DIAGNOSIS — I472 Ventricular tachycardia, unspecified: Secondary | ICD-10-CM

## 2012-02-11 LAB — CBC
HCT: 27.6 % — ABNORMAL LOW (ref 39.0–52.0)
Hemoglobin: 8.3 g/dL — ABNORMAL LOW (ref 13.0–17.0)
MCH: 23 pg — ABNORMAL LOW (ref 26.0–34.0)
MCHC: 30.1 g/dL (ref 30.0–36.0)

## 2012-02-11 LAB — BASIC METABOLIC PANEL
BUN: 13 mg/dL (ref 6–23)
Calcium: 9.1 mg/dL (ref 8.4–10.5)
GFR calc non Af Amer: 63 mL/min — ABNORMAL LOW (ref >90)
Glucose, Bld: 121 mg/dL — ABNORMAL HIGH (ref 70–99)

## 2012-02-11 MED ORDER — METOPROLOL TARTRATE 25 MG PO TABS
25.0000 mg | ORAL_TABLET | Freq: Once | ORAL | Status: AC
  Administered 2012-02-11: 25 mg via ORAL
  Filled 2012-02-11: qty 1

## 2012-02-11 MED ORDER — WARFARIN SODIUM 7.5 MG PO TABS
7.5000 mg | ORAL_TABLET | Freq: Once | ORAL | Status: AC
  Administered 2012-02-11: 7.5 mg via ORAL
  Filled 2012-02-11: qty 1

## 2012-02-11 MED ORDER — WARFARIN - PHARMACIST DOSING INPATIENT
Freq: Every day | Status: DC
Start: 2012-02-11 — End: 2012-02-12

## 2012-02-11 NOTE — Progress Notes (Signed)
ANTICOAGULATION CONSULT NOTE - Follow Up Consult  Pharmacy Consult for heparin Indication: chronic afib, St. Jude mitral valve  No Known Allergies  Patient Measurements: Height: 5\' 7"  (170.2 cm) Weight: 147 lb 1.6 oz (66.724 kg) (scale b) IBW/kg (Calculated) : 66.1  Heparin Dosing Weight: 75 kg  Vital Signs: Temp: 98 F (36.7 C) (12/05 1153) Temp src: Oral (12/05 1153) BP: 139/52 mmHg (12/05 1000) Pulse Rate: 78  (12/05 1000)  Labs:  Basename 02/11/12 0437 02/10/12 0450 02/09/12 1632 02/09/12 0547  HGB 8.3* 9.0* -- --  HCT 27.6* 29.6* -- 29.1*  PLT 196 200 -- 213  APTT -- -- -- --  LABPROT -- -- -- --  INR -- -- -- --  HEPARINUNFRC 0.31 0.14* 0.42 --  CREATININE 1.04 -- -- --  CKTOTAL -- -- -- --  CKMB -- -- -- --  TROPONINI -- -- -- --    Estimated Creatinine Clearance: 48.6 ml/min (by C-G formula based on Cr of 1.04).   Medications:  Scheduled:     . alvimopan  12 mg Oral BID  . furosemide  40 mg Oral Daily  . [EXPIRED] HYDROmorphone      . metoprolol succinate  50 mg Oral Daily  . pantoprazole  40 mg Oral QAC lunch  . simvastatin  10 mg Oral q1800  . sodium chloride  3 mL Intravenous Q12H  . [DISCONTINUED] ferrous sulfate  325 mg Oral TID WC  . [DISCONTINUED] metronidazole  500 mg Intravenous To OR    Assessment: 78 yr old male s/p bowel surgery 12/4. Now on heparin for afib and St. Jude mitral valve. New orders to resume coumadin today.  Note home coumadin dosing; 2.5 mg daily except 5 mg Tues, Thurs, Sun.  Last dose 11/26.  INR on admit 11/27=2.96.  Goal of Therapy:  INR 2.5-3.5 Heparin level 0.3-0.7 units/ml Monitor platelets by anticoagulation protocol: Yes   Plan:  - Continue hepairn at 900 units/hr - Coumadin 7.5 mg po x 1 tonight - Check daily heparin level, CBC and INR  Elden Brucato L. Illene Bolus, PharmD, BCPS Clinical Pharmacist Pager: [phone] Pharmacy: [phone] 02/11/2012 12:08 PM

## 2012-02-11 NOTE — Progress Notes (Signed)
Patient being transferred to 2000 per MD order. Report called to Raynelle Fanning, Charity fundraiser. Patient will transfer in wheel chair, alert and oriented. All vital signs WNL.Will call family to notify of room change.

## 2012-02-11 NOTE — Progress Notes (Signed)
Patient had a 6 beat run of V-tach at 0737 this am. BP 139/52,77 hr. Patient sitting up in chair. Dr. Mayford Knife and Dr. Durene Cal both aware and saw strips. Will continue to monitor patient.

## 2012-02-11 NOTE — Progress Notes (Signed)
02/11/2012 11:34 PM Cardiolgy MD on call paged in regards to pt's HR.  New order received for lopressor 25mg  PO.  Surgery MD on call paged in regards to bloody stool. No new orders received. Will continue to monitor closely. Cezar Misiaszek, Avie Echevaria , RN

## 2012-02-11 NOTE — Progress Notes (Signed)
[facility] PROGRESS NOTE  Alfred Dennis JYN:[id number] DOB: 1923-06-05 DOA: 02/03/2012 PCP: No primary provider on file.  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 Jude mitral valve replacement  -Continue metoprolol succinate-dose increased  -Rate controlled currently  -IV heparin, resume coumadin today if ok with surgery    Procedures:  Colonoscopy and EGD 02/08/2012 Laparoscopic partial colectomy for transverse colon cancer and sessile cecal polyps 02/10/12   Family Communication:   Daughter at beside Disposition Plan:   Transfer to tele    Procedures/Studies: Dg Chest 2 View  02/03/2012  *RADIOLOGY REPORT*  Clinical Data: Short of breath.  Weakness.  CHEST - 2 VIEW  Comparison: None.  Findings: Cardiomegaly.  Pulmonary vascular congestion.  Elevation of the left hemidiaphragm.  Left basilar atelectasis.  No focal consolidation.  Mitral valve replacement.  Interstitial pulmonary edema is present.  IMPRESSION: Cardiomegaly, pulmonary vascular congestion and interstitial pulmonary edema compatible with mild CHF.   Original Report Authenticated By: Andreas Newport, M.D.    Ct Chest W Contrast  02/09/2012  *RADIOLOGY REPORT*  Clinical Data:  Status post colonoscopy 02/08/2012 where a circumferential ulcerated mass was identified transverse colon.  CT CHEST, ABDOMEN AND PELVIS WITH CONTRAST  Technique:  Multidetector  CT imaging of the chest, abdomen and pelvis was performed following the standard protocol during bolus administration of intravenous contrast.  Contrast: OMNIPAQUE IOHEXOL 300 MG/ML  SOLN  Comparison:  Plain films of the chest 02/03/2012.  CT CHEST  Findings:  There is no axillary, hilar or mediastinal lymphadenopathy.  Trace pleural fluid on the left is noted.  There is no right pleural effusion or pericardial effusion.  Massive cardiomegaly is identified.  The patient is status post CABG and mitral valve replacement.  No pulmonary nodule or mass is identified.  There is some mild dependent atelectasis.  No focal bony abnormality is identified.  IMPRESSION:  1.  Negative for metastatic or acute disease. 2.  Massive cardiomegaly.  CT ABDOMEN AND PELVIS  Findings:  The liver, adrenal glands, spleen, pancreas and kidneys all appear normal.  Multiple stones are seen within the gallbladder but there is no CT evidence of cholecystitis.  The patient has a left inguinal hernia containing a loop of descending colon.  No obstruction or other complicating feature is identified.  Scattered colonic diverticula without diverticulitis are noted.  Focal wall thickening is seen in the transverse colon just distal to the hepatic flexure which may represent the patient's mass seen on colonoscopy. The walls of the rectosigmoid colon and cecum are thickened although these loops are under distended.  Stomach, small bowel and appendix are normal in appearance.  The prostate gland is mildly enlarged.  Urinary bladder appears normal.  There is no lymphadenopathy or fluid.  No focal bony abnormality is identified.  IMPRESSION:  1.  Negative for acute or metastatic disease. 2.  Wall thickening in the transverse colon just distal to the hepatic flexure may represent the patient's known colon mass.  Wall  thickening of the cecum and rectosigmoid colon is likely due to under distention.  3.  Mild enlargement of the prostate gland. 4.   Diverticulosis without diverticulitis. 5.  Left inguinal hernia contains a knuckle of colon without obstruction or other complicating feature. 6.  Gallstones without cholecystitis.   Original Report Authenticated By: Holley Dexter, M.D.    Ct Abdomen Pelvis W Contrast  02/09/2012  *RADIOLOGY REPORT*  Clinical Data:  Status post colonoscopy 02/08/2012 where a circumferential ulcerated mass was identified transverse colon.  CT CHEST, ABDOMEN AND PELVIS WITH CONTRAST  Technique:  Multidetector CT imaging of the chest, abdomen and pelvis was performed following the standard protocol during bolus administration of intravenous contrast.  Contrast: OMNIPAQUE IOHEXOL 300 MG/ML  SOLN  Comparison:  Plain films of the chest 02/03/2012.  CT CHEST  Findings:  There is no axillary, hilar or mediastinal lymphadenopathy.  Trace pleural fluid on the left is noted.  There is no right pleural effusion or pericardial effusion.  Massive cardiomegaly is identified.  The patient is status post CABG and mitral valve replacement.  No pulmonary nodule or mass is identified.  There is some mild dependent atelectasis.  No focal bony abnormality is identified.  IMPRESSION:  1.  Negative for metastatic or acute disease. 2.  Massive cardiomegaly.  CT ABDOMEN AND PELVIS  Findings:  The liver, adrenal glands, spleen, pancreas and kidneys all appear normal.  Multiple stones are seen within the gallbladder but there is no CT evidence of cholecystitis.  The patient has a left inguinal hernia containing a loop of descending colon.  No obstruction or other complicating feature is identified.  Scattered colonic diverticula without diverticulitis are noted.  Focal wall thickening is seen in the transverse colon just distal to the hepatic flexure which may represent the patient's mass seen on colonoscopy. The walls of the rectosigmoid colon and cecum are thickened although these loops are under distended.  Stomach, small bowel and appendix are  normal in appearance.  The prostate gland is mildly enlarged.  Urinary bladder appears normal.  There is no lymphadenopathy or fluid.  No focal bony abnormality is identified.  IMPRESSION:  1.  Negative for acute or metastatic disease. 2.  Wall thickening in the transverse colon just distal to the hepatic flexure may represent the patient's known colon mass.  Wall thickening of the cecum and rectosigmoid colon is likely due to under distention.  3.  Mild enlargement of the prostate gland. 4.  Diverticulosis without diverticulitis. 5.  Left inguinal hernia contains a knuckle of colon without obstruction or other complicating feature. 6.  Gallstones without cholecystitis.   Original Report Authenticated By: Holley Dexter, M.D.          Subjective: feels well, no chest pain, sob, abd feels ok, mild pain inbetween   Objective: Filed Vitals:   02/11/12 0400 02/11/12 0700 02/11/12 0800 02/11/12 1000  BP: 124/48  139/52 139/52  Pulse: 79   78  Temp: 98.8 F (37.1 C) 97.8 F (36.6 C) 97.8 F (36.6 C)   TempSrc: Oral Oral Oral   Resp: 22     Height:      Weight:      SpO2: 95%       Intake/Output Summary (Last 24 hours) at 02/11/12 1132 Last data filed at 02/11/12 1000  Gross per 24 hour  Intake   2052 ml  Output    925 ml  Net   1127 ml   Weight change:  Exam:  General:  Pt is alert, follows commands appropriately, not in acute distress  HEENT: No icterus, No thrush, No neck mass, [place]/AT  Cardiovascular: RRR, S1/S2, no rubs, no gallops  Respiratory: CTA bilaterally, no wheezing, no crackles, no rhonchi  Abdomen: Soft, surgical incision noted, mild  tenderness as expected, non distended, no guarding  Extremities: No edema, No lymphangitis, No petechiae, No rashes, no synovitis  Data Reviewed: Basic Metabolic Panel:  Lab 02/11/12 4098 02/08/12 0445 02/06/12 0515 02/05/12 0310  NA 139 142 140 139  K 4.2 4.5 3.5 3.5  CL 105 108 106 104  CO2 25 25 28 26   GLUCOSE 121*  95 92 114*  BUN 13 23 23 22   CREATININE 1.04 1.20 1.34 1.25  CALCIUM 9.1 9.3 8.6 8.7  MG -- -- -- --  PHOS -- -- -- --   Liver Function Tests: No results found for this basename: AST:5,ALT:5,ALKPHOS:5,BILITOT:5,PROT:5,ALBUMIN:5 in the last 168 hours No results found for this basename: LIPASE:5,AMYLASE:5 in the last 168 hours No results found for this basename: AMMONIA:5 in the last 168 hours CBC:  Lab 02/11/12 0437 02/10/12 0450 02/09/12 0547 02/08/12 0445 02/07/12 0545  WBC 10.0 4.7 7.7 5.4 5.9  NEUTROABS -- -- -- -- --  HGB 8.3* 9.0* 8.8* 8.9* 8.0*  HCT 27.6* 29.6* 29.1* 29.4* 26.4*  MCV 76.5* 76.1* 75.6* 75.2* 74.4*  PLT 196 200 213 225 211   Cardiac Enzymes: No results found for this basename: CKTOTAL:5,CKMB:5,CKMBINDEX:5,TROPONINI:5 in the last 168 hours BNP: No components found with this basename: POCBNP:5 CBG:  Lab 02/08/12 1112  GLUCAP 116*    Recent Results (from the past 240 hour(s))  SURGICAL PCR SCREEN     Status: Normal   Collection Time   02/10/12  5:14 AM      Component Value Range Status Comment   MRSA, PCR NEGATIVE  NEGATIVE Final    Staphylococcus aureus NEGATIVE  NEGATIVE Final      Scheduled Meds:    . alvimopan  12 mg Oral BID  . furosemide  40 mg Oral Daily  . [EXPIRED] HYDROmorphone      . metoprolol succinate  50 mg Oral Daily  . pantoprazole  40 mg Oral QAC lunch  . simvastatin  10 mg Oral q1800  . sodium chloride  3 mL Intravenous Q12H  . [DISCONTINUED] ferrous sulfate  325 mg Oral TID WC  . [DISCONTINUED] metronidazole  500 mg Intravenous To OR   Continuous Infusions:    . dextrose 5 % and 0.45 % NaCl with KCl 20 mEq/L 50 mL/hr (02/10/12 1818)  . heparin 900 Units/hr (02/11/12 1000)  . [DISCONTINUED] lactated ringers 50 mL/hr at 02/10/12 1191     Zannie Cove, MD  [facility] Pager [phone]  If 7PM-7AM, please contact night-coverage [email or web address] Password [email or web address] 02/11/2012, 11:32 AM   LOS: 8 days

## 2012-02-11 NOTE — Progress Notes (Signed)
02/11/2012 11:03 PM Pt with chronic Afib HR has been stable in the 80s. Currently pt HR is 115-140s with BP 111/63. Pt denies CP or SHOB.  Pt also passed a moderate amount of bloody, watery stool. Pt is on Heparin gtt at 78ml/hr and PO coumdain restarted today.  MD on call paged. Awaiting call back. Smitty Ackerley, Avie Echevaria , RN

## 2012-02-11 NOTE — Progress Notes (Signed)
Agree. Keep foley to watch urine output.

## 2012-02-11 NOTE — Progress Notes (Signed)
Patient ID: Alfred Dennis, male   DOB: February 25, 1924, 78 y.o.   MRN: [id number] 1 Day Post-Op  Subjective: Resting quietly by himself. No complaints except for slight pain controlled by morphine with transfers.   Objective: Vital signs in last 24 hours: Temp:  [97 F (36.1 C)-98.8 F (37.1 C)] 98.8 F (37.1 C) (12/05 0400) Pulse Rate:  [63-88] 79  (12/05 0400) Resp:  [11-22] 22  (12/05 0400) BP: (107-142)/(39-74) 124/48 mmHg (12/05 0400) SpO2:  [95 %-100 %] 95 % (12/05 0400) Last BM Date: 02/08/12  Intake/Output from previous day: 12/04 0701 - 12/05 0700 In: 1822 [I.V.:1822] Out: 525 [Urine:525] Intake/Output this shift:    General appearance: alert, cooperative, appears stated age and no distress  Chest: CTA anteriorly. Crackles in bilateral bases.  Cardiac: irregularly irregular Abdomen: soft, + but diminished BS, appropriate (mild) tenderness to palpation, nondistended Extremities: warm to touch, 1+ edema   Lab Results:  Labs: wbc wnl but trending up, H&H with minimal reduction, PLTs down slightly.   Basename 02/11/12 0437 02/10/12 0450  WBC 10.0 4.7  HGB 8.3* 9.0*  HCT 27.6* 29.6*  PLT 196 200   BMET  Basename 02/11/12 0437  NA 139  K 4.2  CL 105  CO2 25  GLUCOSE 121*  BUN 13  CREATININE 1.04  CALCIUM 9.1   PT/INR No results found for this basename: LABPROT:2,INR:2 in the last 72 hours ABG No results found for this basename: PHART:2,PCO2:2,PO2:2,HCO3:2 in the last 72 hours  Studies/Results: Ct Chest W Contrast  02/09/2012  *RADIOLOGY REPORT*  Clinical Data:  Status post colonoscopy 02/08/2012 where a circumferential ulcerated mass was identified transverse colon.  CT CHEST, ABDOMEN AND PELVIS WITH CONTRAST  Technique:  Multidetector CT imaging of the chest, abdomen and pelvis was performed following the standard protocol during bolus administration of intravenous contrast.  Contrast: OMNIPAQUE IOHEXOL 300 MG/ML  SOLN  Comparison:  Plain films of the  chest 02/03/2012.  CT CHEST  Findings:  There is no axillary, hilar or mediastinal lymphadenopathy.  Trace pleural fluid on the left is noted.  There is no right pleural effusion or pericardial effusion.  Massive cardiomegaly is identified.  The patient is status post CABG and mitral valve replacement.  No pulmonary nodule or mass is identified.  There is some mild dependent atelectasis.  No focal bony abnormality is identified.  IMPRESSION:  1.  Negative for metastatic or acute disease. 2.  Massive cardiomegaly.  CT ABDOMEN AND PELVIS  Findings:  The liver, adrenal glands, spleen, pancreas and kidneys all appear normal.  Multiple stones are seen within the gallbladder but there is no CT evidence of cholecystitis.  The patient has a left inguinal hernia containing a loop of descending colon.  No obstruction or other complicating feature is identified.  Scattered colonic diverticula without diverticulitis are noted.  Focal wall thickening is seen in the transverse colon just distal to the hepatic flexure which may represent the patient's mass seen on colonoscopy. The walls of the rectosigmoid colon and cecum are thickened although these loops are under distended.  Stomach, small bowel and appendix are normal in appearance.  The prostate gland is mildly enlarged.  Urinary bladder appears normal.  There is no lymphadenopathy or fluid.  No focal bony abnormality is identified.  IMPRESSION:  1.  Negative for acute or metastatic disease. 2.  Wall thickening in the transverse colon just distal to the hepatic flexure may represent the patient's known colon mass.  Wall thickening of  the cecum and rectosigmoid colon is likely due to under distention.  3.  Mild enlargement of the prostate gland. 4.  Diverticulosis without diverticulitis. 5.  Left inguinal hernia contains a knuckle of colon without obstruction or other complicating feature. 6.  Gallstones without cholecystitis.   Original Report Authenticated By: Holley Dexter, M.D.    Ct Abdomen Pelvis W Contrast  02/09/2012  *RADIOLOGY REPORT*  Clinical Data:  Status post colonoscopy 02/08/2012 where a circumferential ulcerated mass was identified transverse colon.  CT CHEST, ABDOMEN AND PELVIS WITH CONTRAST  Technique:  Multidetector CT imaging of the chest, abdomen and pelvis was performed following the standard protocol during bolus administration of intravenous contrast.  Contrast: OMNIPAQUE IOHEXOL 300 MG/ML  SOLN  Comparison:  Plain films of the chest 02/03/2012.  CT CHEST  Findings:  There is no axillary, hilar or mediastinal lymphadenopathy.  Trace pleural fluid on the left is noted.  There is no right pleural effusion or pericardial effusion.  Massive cardiomegaly is identified.  The patient is status post CABG and mitral valve replacement.  No pulmonary nodule or mass is identified.  There is some mild dependent atelectasis.  No focal bony abnormality is identified.  IMPRESSION:  1.  Negative for metastatic or acute disease. 2.  Massive cardiomegaly.  CT ABDOMEN AND PELVIS  Findings:  The liver, adrenal glands, spleen, pancreas and kidneys all appear normal.  Multiple stones are seen within the gallbladder but there is no CT evidence of cholecystitis.  The patient has a left inguinal hernia containing a loop of descending colon.  No obstruction or other complicating feature is identified.  Scattered colonic diverticula without diverticulitis are noted.  Focal wall thickening is seen in the transverse colon just distal to the hepatic flexure which may represent the patient's mass seen on colonoscopy. The walls of the rectosigmoid colon and cecum are thickened although these loops are under distended.  Stomach, small bowel and appendix are normal in appearance.  The prostate gland is mildly enlarged.  Urinary bladder appears normal.  There is no lymphadenopathy or fluid.  No focal bony abnormality is identified.  IMPRESSION:  1.  Negative for acute or  metastatic disease. 2.  Wall thickening in the transverse colon just distal to the hepatic flexure may represent the patient's known colon mass.  Wall thickening of the cecum and rectosigmoid colon is likely due to under distention.  3.  Mild enlargement of the prostate gland. 4.  Diverticulosis without diverticulitis. 5.  Left inguinal hernia contains a knuckle of colon without obstruction or other complicating feature. 6.  Gallstones without cholecystitis.   Original Report Authenticated By: Holley Dexter, M.D.     Anti-infectives: Anti-infectives     Start     Dose/Rate Route Frequency Ordered Stop   02/10/12 1130   metroNIDAZOLE (FLAGYL) IVPB 500 mg  Status:  Discontinued        500 mg 100 mL/hr over 60 Minutes Intravenous To Surgery 02/10/12 1129 02/10/12 1604          Assessment/Plan: Patient Active Problem List  Diagnosis  . Chronic a-fib  . CHF (congestive heart failure)  . ARF (acute renal failure)  . HTN (hypertension)  . S/P MVR (mitral valve replacement)  . GIB (gastrointestinal bleeding)  . Iron deficiency anemia  . Acute on chronic systolic CHF (congestive heart failure)  . Non-sustained ventricular tachycardia  . Carcinoma of colon  . Benign neoplasm of colon  . Gastritis  . Colonic mass  s/p Procedure(s) (LRB) with comments: LAPAROSCOPIC PARTIAL COLECTOMY (N/A) for transverse colon cancer and sessile cecal polyps POD #1  Doing well postoperatively   Plan:  1. Advance to liquid clears 2. Remove foley 12/6 POD #2  2. Heparin restarted by primary team in evening after surgery. with mechanical MV, cards/primary team managing  3. Management of his other medical needs per medicine team.   LOS: 8 days    Tana Conch MD 02/11/2012

## 2012-02-11 NOTE — Progress Notes (Signed)
SUBJECTIVE:  Sitting up in a chair with no complaints OBJECTIVE:   Vitals:   Filed Vitals:   02/10/12 2000 02/11/12 0007 02/11/12 0400 02/11/12 0700  BP: 137/55 123/49 124/48   Pulse: 80 76 79   Temp: 97.6 F (36.4 C) 98.5 F (36.9 C) 98.8 F (37.1 C) 97.8 F (36.6 C)  TempSrc: Oral Oral Oral Oral  Resp: 11 17 22    Height:      Weight:      SpO2: 100% 96% 95%    I&O's:   Intake/Output Summary (Last 24 hours) at 02/11/12 0846 Last data filed at 02/11/12 0700  Gross per 24 hour  Intake   1822 ml  Output    725 ml  Net   1097 ml   TELEMETRY: Reviewed telemetry pt in atrial fibrillation with short run of NSVT     PHYSICAL EXAM General: Well developed, well nourished, in no acute distress Head: Eyes PERRLA, No xanthomas.   Normal cephalic and atramatic  Lungs:   Clear bilaterally to auscultation and percussion. Heart:   HRRR S1 S2 Pulses are 2+ & equal. Abdomen: Bowel sounds are positive, abdomen soft and non-tender without masses Extremities:   No clubbing, cyanosis or edema.  DP +1 Neuro: Alert and oriented X 3. Psych:  Good affect, responds appropriately   LABS: Basic Metabolic Panel:  Basename 02/11/12 0437  NA 139  K 4.2  CL 105  CO2 25  GLUCOSE 121*  BUN 13  CREATININE 1.04  CALCIUM 9.1  MG --  PHOS --   Liver Function Tests: No results found for this basename: AST:2,ALT:2,ALKPHOS:2,BILITOT:2,PROT:2,ALBUMIN:2 in the last 72 hours No results found for this basename: LIPASE:2,AMYLASE:2 in the last 72 hours CBC:  Basename 02/11/12 0437 02/10/12 0450  WBC 10.0 4.7  NEUTROABS -- --  HGB 8.3* 9.0*  HCT 27.6* 29.6*  MCV 76.5* 76.1*  PLT 196 200   Coag Panel:   Lab Results  Component Value Date   INR 1.32 02/08/2012   INR 1.31 02/07/2012   INR 1.59* 02/06/2012    RADIOLOGY: Dg Chest 2 View  02/03/2012  *RADIOLOGY REPORT*  Clinical Data: Short of breath.  Weakness.  CHEST - 2 VIEW  Comparison: None.  Findings: Cardiomegaly.  Pulmonary vascular  congestion.  Elevation of the left hemidiaphragm.  Left basilar atelectasis.  No focal consolidation.  Mitral valve replacement.  Interstitial pulmonary edema is present.  IMPRESSION: Cardiomegaly, pulmonary vascular congestion and interstitial pulmonary edema compatible with mild CHF.   Original Report Authenticated By: Andreas Newport, M.D.    Ct Chest W Contrast  02/09/2012  *RADIOLOGY REPORT*  Clinical Data:  Status post colonoscopy 02/08/2012 where a circumferential ulcerated mass was identified transverse colon.  CT CHEST, ABDOMEN AND PELVIS WITH CONTRAST  Technique:  Multidetector CT imaging of the chest, abdomen and pelvis was performed following the standard protocol during bolus administration of intravenous contrast.  Contrast: OMNIPAQUE IOHEXOL 300 MG/ML  SOLN  Comparison:  Plain films of the chest 02/03/2012.  CT CHEST  Findings:  There is no axillary, hilar or mediastinal lymphadenopathy.  Trace pleural fluid on the left is noted.  There is no right pleural effusion or pericardial effusion.  Massive cardiomegaly is identified.  The patient is status post CABG and mitral valve replacement.  No pulmonary nodule or mass is identified.  There is some mild dependent atelectasis.  No focal bony abnormality is identified.  IMPRESSION:  1.  Negative for metastatic or acute disease. 2.  Massive cardiomegaly.  CT ABDOMEN AND PELVIS  Findings:  The liver, adrenal glands, spleen, pancreas and kidneys all appear normal.  Multiple stones are seen within the gallbladder but there is no CT evidence of cholecystitis.  The patient has a left inguinal hernia containing a loop of descending colon.  No obstruction or other complicating feature is identified.  Scattered colonic diverticula without diverticulitis are noted.  Focal wall thickening is seen in the transverse colon just distal to the hepatic flexure which may represent the patient's mass seen on colonoscopy. The walls of the rectosigmoid colon and cecum  are thickened although these loops are under distended.  Stomach, small bowel and appendix are normal in appearance.  The prostate gland is mildly enlarged.  Urinary bladder appears normal.  There is no lymphadenopathy or fluid.  No focal bony abnormality is identified.  IMPRESSION:  1.  Negative for acute or metastatic disease. 2.  Wall thickening in the transverse colon just distal to the hepatic flexure may represent the patient's known colon mass.  Wall thickening of the cecum and rectosigmoid colon is likely due to under distention.  3.  Mild enlargement of the prostate gland. 4.  Diverticulosis without diverticulitis. 5.  Left inguinal hernia contains a knuckle of colon without obstruction or other complicating feature. 6.  Gallstones without cholecystitis.   Original Report Authenticated By: Holley Dexter, M.D.    Ct Abdomen Pelvis W Contrast  02/09/2012  *RADIOLOGY REPORT*  Clinical Data:  Status post colonoscopy 02/08/2012 where a circumferential ulcerated mass was identified transverse colon.  CT CHEST, ABDOMEN AND PELVIS WITH CONTRAST  Technique:  Multidetector CT imaging of the chest, abdomen and pelvis was performed following the standard protocol during bolus administration of intravenous contrast.  Contrast: OMNIPAQUE IOHEXOL 300 MG/ML  SOLN  Comparison:  Plain films of the chest 02/03/2012.  CT CHEST  Findings:  There is no axillary, hilar or mediastinal lymphadenopathy.  Trace pleural fluid on the left is noted.  There is no right pleural effusion or pericardial effusion.  Massive cardiomegaly is identified.  The patient is status post CABG and mitral valve replacement.  No pulmonary nodule or mass is identified.  There is some mild dependent atelectasis.  No focal bony abnormality is identified.  IMPRESSION:  1.  Negative for metastatic or acute disease. 2.  Massive cardiomegaly.  CT ABDOMEN AND PELVIS  Findings:  The liver, adrenal glands, spleen, pancreas and kidneys all appear  normal.  Multiple stones are seen within the gallbladder but there is no CT evidence of cholecystitis.  The patient has a left inguinal hernia containing a loop of descending colon.  No obstruction or other complicating feature is identified.  Scattered colonic diverticula without diverticulitis are noted.  Focal wall thickening is seen in the transverse colon just distal to the hepatic flexure which may represent the patient's mass seen on colonoscopy. The walls of the rectosigmoid colon and cecum are thickened although these loops are under distended.  Stomach, small bowel and appendix are normal in appearance.  The prostate gland is mildly enlarged.  Urinary bladder appears normal.  There is no lymphadenopathy or fluid.  No focal bony abnormality is identified.  IMPRESSION:  1.  Negative for acute or metastatic disease. 2.  Wall thickening in the transverse colon just distal to the hepatic flexure may represent the patient's known colon mass.  Wall thickening of the cecum and rectosigmoid colon is likely due to under distention.  3.  Mild enlargement of  the prostate gland. 4.  Diverticulosis without diverticulitis. 5.  Left inguinal hernia contains a knuckle of colon without obstruction or other complicating feature. 6.  Gallstones without cholecystitis.   Original Report Authenticated By: Holley Dexter, M.D.     Assessment/Plan:  Principal Problem:  *CHF (congestive heart failure)  Active Problems:  Chronic a-fib  ARF (acute renal failure)  HTN (hypertension)  S/P MVR (mitral valve replacement)  GIB (gastrointestinal bleeding)  Iron deficiency anemia  Acute on chronic systolic CHF (congestive heart failure)  Non-sustained ventricular tachycardia  Colonic mass most likely CA  78 year old male who was admitted with anemia, status post transfusion, GI bleed with mechanical St. Jude mitral valve, chronic anticoagulation, hypertension, persistent chronic atrial fibrillation fairly well rate  controlled with chronic lower extremity edema consistent with acute on chronic diastolic/systolic heart failure, past EF 40% now 20-25%  1. Mechanical mitral valve-on IV heparin - restart coumadin when ok with surgery 2. Atrial fibrillation-persistent rate controlled.  3. Acute on chronic diastolic/systolic heart failure-prior ejection fraction in the 40% range now 20-25% -appears compensated.  4. Anemia/GI bleed secondary to colon CA - stable 5. Non sustained VT -had a reocurrence this am - asymptomatic - metoprolol being restarted today 6. Colonic mass c/w adenoCA by path - s/p colon resection      Quintella Reichert, MD  02/11/2012  8:46 AM

## 2012-02-12 ENCOUNTER — Inpatient Hospital Stay (HOSPITAL_COMMUNITY): Payer: Medicare Other

## 2012-02-12 DIAGNOSIS — I959 Hypotension, unspecified: Secondary | ICD-10-CM

## 2012-02-12 LAB — PROTIME-INR
INR: 1.58 — ABNORMAL HIGH (ref 0.00–1.49)
Prothrombin Time: 18.4 seconds — ABNORMAL HIGH (ref 11.6–15.2)
Prothrombin Time: 19.6 seconds — ABNORMAL HIGH (ref 11.6–15.2)

## 2012-02-12 LAB — CBC
MCHC: 30.2 g/dL (ref 30.0–36.0)
Platelets: 197 10*3/uL (ref 150–400)
Platelets: 159 10*3/uL (ref 150–400)
RBC: 3.11 MIL/uL — ABNORMAL LOW (ref 4.22–5.81)
RDW: 22.8 % — ABNORMAL HIGH (ref 11.5–15.5)
RDW: 20.3 % — ABNORMAL HIGH (ref 11.5–15.5)
WBC: 9 10*3/uL (ref 4.0–10.5)
WBC: 8.6 10*3/uL (ref 4.0–10.5)

## 2012-02-12 LAB — APTT: aPTT: 33 seconds (ref 24–37)

## 2012-02-12 LAB — PREPARE RBC (CROSSMATCH)

## 2012-02-12 LAB — HEMOGLOBIN AND HEMATOCRIT, BLOOD
HCT: 23.6 % — ABNORMAL LOW (ref 39.0–52.0)
Hemoglobin: 7.1 g/dL — ABNORMAL LOW (ref 13.0–17.0)

## 2012-02-12 LAB — HEPARIN LEVEL (UNFRACTIONATED): Heparin Unfractionated: 0.13 IU/mL — ABNORMAL LOW (ref 0.30–0.70)

## 2012-02-12 LAB — BASIC METABOLIC PANEL
BUN: 17 mg/dL (ref 6–23)
Chloride: 103 mEq/L (ref 96–112)
GFR calc Af Amer: 51 mL/min — ABNORMAL LOW (ref >90)
GFR calc non Af Amer: 44 mL/min — ABNORMAL LOW (ref >90)
Potassium: 4.3 mEq/L (ref 3.5–5.1)
Sodium: 138 mEq/L (ref 135–145)

## 2012-02-12 MED ORDER — SODIUM CHLORIDE 0.9 % IV SOLN
INTRAVENOUS | Status: DC
  Administered 2012-02-12 (×2): via INTRAVENOUS
  Filled 2012-02-12 (×4): qty 1000

## 2012-02-12 MED ORDER — SODIUM CHLORIDE 0.9 % IV BOLUS (SEPSIS)
250.0000 mL | Freq: Once | INTRAVENOUS | Status: AC
  Administered 2012-02-12: 250 mL via INTRAVENOUS

## 2012-02-12 MED ORDER — VITAMIN K1 10 MG/ML IJ SOLN
5.0000 mg | Freq: Once | INTRAVENOUS | Status: DC
  Filled 2012-02-12: qty 0.5

## 2012-02-12 MED ORDER — WARFARIN - PHARMACIST DOSING INPATIENT: Freq: Every day | Status: DC

## 2012-02-12 MED ORDER — SODIUM CHLORIDE 0.9 % IJ SOLN
INTRAMUSCULAR | Status: AC
  Administered 2012-02-12: 18:00:00
  Filled 2012-02-12: qty 10

## 2012-02-12 MED ORDER — DIPHENHYDRAMINE HCL 12.5 MG/5ML PO ELIX
6.2500 mg | ORAL_SOLUTION | Freq: Every evening | ORAL | Status: DC | PRN
  Filled 2012-02-12: qty 5

## 2012-02-12 MED ORDER — HEPARIN (PORCINE) IN NACL 100-0.45 UNIT/ML-% IJ SOLN
1000.0000 [IU]/h | INTRAMUSCULAR | Status: DC
  Filled 2012-02-12: qty 250

## 2012-02-12 NOTE — Progress Notes (Addendum)
Called report to [employer] on 3300.  Transfer pt to unit.Dereck Ligas [facility] daughter Clydie Braun at [phone] at left voice mail concerning pt transfer to [phone].  Left contact number and passed information to RN Mardene Celeste on 3300. Thomas Hoff

## 2012-02-12 NOTE — Progress Notes (Signed)
Agree with above.  Other than bloody bm, doing well. Advance diet to full liquids. /would await flatus before advancing to regular.

## 2012-02-12 NOTE — Progress Notes (Signed)
1045 pt tx from 2000 due to rectal bleeding (see progress note; pt assessement), pt reoriented to room, txing RN left voice message updating family of tx,

## 2012-02-12 NOTE — Progress Notes (Signed)
Peripherally Inserted Central Catheter/Midline Placement  The IV Nurse has discussed with the patient and/or persons authorized to consent for the patient, the purpose of this procedure and the potential benefits and risks involved with this procedure.  The benefits include less needle sticks, lab draws from the catheter and patient may be discharged home with the catheter.  Risks include, but not limited to, infection, bleeding, blood clot (thrombus formation), and puncture of an artery; nerve damage and irregular heat beat.  Alternatives to this procedure were also discussed.  PICC/Midline Placement Documentation  PICC / Midline Double Lumen 02/12/12 PICC Right Basilic (Active)   explained by Daleen Squibb 02/12/2012, 4:57 PM

## 2012-02-12 NOTE — Progress Notes (Addendum)
[facility] PROGRESS NOTE  Alfred Dennis ZOX:[id number] DOB: Jul 19, 1923 DOA: 02/03/2012 PCP: No primary provider on file.  Assessment/Plan: Acute on chronic systolic CHF  -EF-25-30  -hold metoprolol and lasix due to hypotension  -Appreciate cardiology recommendations  -Daily weights, strict I.'s and O.'s  -neg 7500cc for the admission  -TSH 3.922  - cards following  Adenocarcinoma of Transverse colon -Noted on colonoscopy  -s/p R-hemicolectomy 12/4 per [facility]  NSVTach  -12/5 with NSVT 6 beats, K ok -due to cardiomyopathy   Blood loss Anemia/Iron deficiency  Recurrent Gi bleed this am, hold heparin gtt for today, unfortunately with metallic mitral valve need heparin resumed soon if possible. -Transfused 2 units PRBCs 11/27--hemoglobin dropped by 2 grams from yesterday, transfuse 2 units PRBC -continue iron supplementation   Chronic atrial fibrillation/history of St. Jude mitral valve replacement  -Continue metoprolol succinate-dose increased  -Rate controlled currently  -IV heparin/coumadin on hold with active bleeding this am/hypotension    Procedures:  Colonoscopy and EGD 02/08/2012 Laparoscopic partial colectomy for transverse colon cancer and sessile cecal polyps 02/10/12   Family Communication:   Patient at bedside Disposition Plan:   Transfer to tele    Procedures/Studies: Dg Chest 2 View  02/03/2012  *RADIOLOGY REPORT*  Clinical Data: Short of breath.  Weakness.  CHEST - 2 VIEW  Comparison: None.  Findings: Cardiomegaly.  Pulmonary vascular congestion.  Elevation of the left hemidiaphragm.  Left basilar atelectasis.  No focal consolidation.  Mitral valve replacement.  Interstitial pulmonary edema is present.  IMPRESSION: Cardiomegaly, pulmonary vascular congestion and interstitial pulmonary edema compatible with mild CHF.   Original Report Authenticated By: Andreas Newport, M.D.    Ct Chest W Contrast  02/09/2012  *RADIOLOGY REPORT*  Clinical Data:   Status post colonoscopy 02/08/2012 where a circumferential ulcerated mass was identified transverse colon.  CT CHEST, ABDOMEN AND PELVIS WITH CONTRAST  Technique:  Multidetector CT imaging of the chest, abdomen and pelvis was performed following the standard protocol during bolus administration of intravenous contrast.  Contrast: OMNIPAQUE IOHEXOL 300 MG/ML  SOLN  Comparison:  Plain films of the chest 02/03/2012.  CT CHEST  Findings:  There is no axillary, hilar or mediastinal lymphadenopathy.  Trace pleural fluid on the left is noted.  There is no right pleural effusion or pericardial effusion.  Massive cardiomegaly is identified.  The patient is status post CABG and mitral valve replacement.  No pulmonary nodule or mass is identified.  There is some mild dependent atelectasis.  No focal bony abnormality is identified.  IMPRESSION:  1.  Negative for metastatic or acute disease. 2.  Massive cardiomegaly.  CT ABDOMEN AND PELVIS  Findings:  The liver, adrenal glands, spleen, pancreas and kidneys all appear normal.  Multiple stones are seen within the gallbladder but there is no CT evidence of cholecystitis.  The patient has a left inguinal hernia containing a loop of descending colon.  No obstruction or other complicating feature is identified.  Scattered colonic diverticula without diverticulitis are noted.  Focal wall thickening is seen in the transverse colon just distal to the hepatic flexure which may represent the patient's mass seen on colonoscopy. The walls of the rectosigmoid colon and cecum are thickened although these loops are under distended.  Stomach, small bowel and appendix are normal in appearance.  The prostate gland is mildly enlarged.  Urinary bladder appears normal.  There is no lymphadenopathy or fluid.  No focal bony abnormality is identified.  IMPRESSION:  1.  Negative for acute  or metastatic disease. 2.  Wall thickening in the transverse colon just distal to the hepatic flexure may  represent the patient's known colon mass.  Wall thickening of the cecum and rectosigmoid colon is likely due to under distention.  3.  Mild enlargement of the prostate gland. 4.  Diverticulosis without diverticulitis. 5.  Left inguinal hernia contains a knuckle of colon without obstruction or other complicating feature. 6.  Gallstones without cholecystitis.   Original Report Authenticated By: Holley Dexter, M.D.    Ct Abdomen Pelvis W Contrast  02/09/2012  *RADIOLOGY REPORT*  Clinical Data:  Status post colonoscopy 02/08/2012 where a circumferential ulcerated mass was identified transverse colon.  CT CHEST, ABDOMEN AND PELVIS WITH CONTRAST  Technique:  Multidetector CT imaging of the chest, abdomen and pelvis was performed following the standard protocol during bolus administration of intravenous contrast.  Contrast: OMNIPAQUE IOHEXOL 300 MG/ML  SOLN  Comparison:  Plain films of the chest 02/03/2012.  CT CHEST  Findings:  There is no axillary, hilar or mediastinal lymphadenopathy.  Trace pleural fluid on the left is noted.  There is no right pleural effusion or pericardial effusion.  Massive cardiomegaly is identified.  The patient is status post CABG and mitral valve replacement.  No pulmonary nodule or mass is identified.  There is some mild dependent atelectasis.  No focal bony abnormality is identified.  IMPRESSION:  1.  Negative for metastatic or acute disease. 2.  Massive cardiomegaly.  CT ABDOMEN AND PELVIS  Findings:  The liver, adrenal glands, spleen, pancreas and kidneys all appear normal.  Multiple stones are seen within the gallbladder but there is no CT evidence of cholecystitis.  The patient has a left inguinal hernia containing a loop of descending colon.  No obstruction or other complicating feature is identified.  Scattered colonic diverticula without diverticulitis are noted.  Focal wall thickening is seen in the transverse colon just distal to the hepatic flexure which may represent  the patient's mass seen on colonoscopy. The walls of the rectosigmoid colon and cecum are thickened although these loops are under distended.  Stomach, small bowel and appendix are normal in appearance.  The prostate gland is mildly enlarged.  Urinary bladder appears normal.  There is no lymphadenopathy or fluid.  No focal bony abnormality is identified.  IMPRESSION:  1.  Negative for acute or metastatic disease. 2.  Wall thickening in the transverse colon just distal to the hepatic flexure may represent the patient's known colon mass.  Wall thickening of the cecum and rectosigmoid colon is likely due to under distention.  3.  Mild enlargement of the prostate gland. 4.  Diverticulosis without diverticulitis. 5.  Left inguinal hernia contains a knuckle of colon without obstruction or other complicating feature. 6.  Gallstones without cholecystitis.   Original Report Authenticated By: Holley Dexter, M.D.          Subjective:  Dark red/black stools this morning, BP dropped, pt denies any chest pain, SoB  Objective: Filed Vitals:   02/12/12 1415 02/12/12 1430 02/12/12 1445 02/12/12 1500  BP: 109/56 102/40 112/53 111/52  Pulse: 91 83 87 85  Temp:   98 F (36.7 C) 97.8 F (36.6 C)  TempSrc:   Oral Oral  Resp: 15 16 16 16   Height:      Weight:      SpO2:        Intake/Output Summary (Last 24 hours) at 02/12/12 1537 Last data filed at 02/12/12 1445  Gross per 24 hour  Intake   82.5 ml  Output   1752 ml  Net -1669.5 ml   Weight change:  Exam:   General:  Pt is alert, follows commands appropriately, not in acute distress  HEENT: No icterus, No thrush, No neck mass, [place]/AT  Cardiovascular: RRR, S1/S2, metallic click, no rubs, no gallops  Respiratory: CTA bilaterally, no wheezing, no crackles, no rhonchi  Abdomen: Soft, surgical incision noted, mild  tenderness as expected, non distended, no guarding  Extremities: No edema, No lymphangitis, No petechiae, No rashes, no  synovitis  Data Reviewed: Basic Metabolic Panel:  Lab 02/12/12 6213 02/11/12 0437 02/08/12 0445 02/06/12 0515  NA 138 139 142 140  K 4.3 4.2 4.5 3.5  CL 103 105 108 106  CO2 25 25 25 28   GLUCOSE 133* 121* 95 92  BUN 17 13 23 23   CREATININE 1.41* 1.04 1.20 1.34  CALCIUM 8.9 9.1 9.3 8.6  MG -- -- -- --  PHOS -- -- -- --   Liver Function Tests: No results found for this basename: AST:5,ALT:5,ALKPHOS:5,BILITOT:5,PROT:5,ALBUMIN:5 in the last 168 hours No results found for this basename: LIPASE:5,AMYLASE:5 in the last 168 hours No results found for this basename: AMMONIA:5 in the last 168 hours CBC:  Lab 02/12/12 1001 02/12/12 0535 02/11/12 0437 02/10/12 0450 02/09/12 0547 02/08/12 0445  WBC -- 9.0 10.0 4.7 7.7 5.4  NEUTROABS -- -- -- -- -- --  HGB 7.1* 7.5* 8.3* 9.0* 8.8* --  HCT 23.6* 24.8* 27.6* 29.6* 29.1* --  MCV -- 75.8* 76.5* 76.1* 75.6* 75.2*  PLT -- 197 196 200 213 225   Cardiac Enzymes: No results found for this basename: CKTOTAL:5,CKMB:5,CKMBINDEX:5,TROPONINI:5 in the last 168 hours BNP: No components found with this basename: POCBNP:5 CBG:  Lab 02/08/12 1112  GLUCAP 116*    Recent Results (from the past 240 hour(s))  SURGICAL PCR SCREEN     Status: Normal   Collection Time   02/10/12  5:14 AM      Component Value Range Status Comment   MRSA, PCR NEGATIVE  NEGATIVE Final    Staphylococcus aureus NEGATIVE  NEGATIVE Final      Scheduled Meds:    . alvimopan  12 mg Oral BID  . furosemide  40 mg Oral Daily  . metoprolol succinate  50 mg Oral Daily  . [COMPLETED] metoprolol tartrate  25 mg Oral Once  . pantoprazole  40 mg Oral QAC lunch  . simvastatin  10 mg Oral q1800  . [COMPLETED] sodium chloride  250 mL Intravenous Once  . sodium chloride  3 mL Intravenous Q12H  . [COMPLETED] warfarin  7.5 mg Oral ONCE-1800  . Warfarin - Pharmacist Dosing Inpatient   Does not apply q1800  . [DISCONTINUED] phytonadione (VITAMIN K) IV  5 mg Intravenous Once  .  [DISCONTINUED] Warfarin - Pharmacist Dosing Inpatient   Does not apply q1800   Continuous Infusions:    . sodium chloride 0.9 % 1,000 mL with potassium chloride 20 mEq infusion    . [DISCONTINUED] heparin Stopped (02/12/12 0940)  . [DISCONTINUED] heparin       Zannie Cove, MD  [facility] Pager [phone]  If 7PM-7AM, please contact night-coverage [email or web address] Password [email or web address] 02/12/2012, 3:37 PM   LOS: 9 days

## 2012-02-12 NOTE — Progress Notes (Signed)
Patient ID: Alfred Dennis, male   DOB: 1923/04/17, 78 y.o.   MRN: [id number] Called to see patient due to hypotension.  He is currently getting a rapid infusion of his first unit of pRBCs and then getting a second unit.  He has not had any  More bloody BMs since this morning in unit 2000.  The patient is sitting up in NAD eating sherbet.  He denies any pain or any other complaints.  PE: General: sitting up in bed eating in NAD Abd: soft, appropriately tender, ND Heart: regular rate  A/P: 1. Will give the 2 units of pRBCs.    Will go ahead and give 1 unit of FFP.  Cont to hold heparin and coumadin. 2. Get stat H/H after infusion is complete 3. Restart maintenance IVFs given only 76cc in bladder (on bladder scan) and foley dc this morning at 0600am with no UOP since then.  I think a lot of his hypotension right now is secondary to volume depletion.  Will follow closely.  Bo Rogue E 1:45 PM 02/12/2012

## 2012-02-12 NOTE — Progress Notes (Signed)
Pt bp 71/46, MD Jomarie Longs notified, blood txing MD stated to rapid infuse blood over 1 hr and notify general sx MD(called), RN will cont to monitor

## 2012-02-12 NOTE — Progress Notes (Signed)
ANTICOAGULATION CONSULT NOTE - Follow Up Consult  Pharmacy Consult for heparin Indication: atrial fibrillation and SJV  Labs:  Basename 02/12/12 0535 02/11/12 0437 02/10/12 0450  HGB 7.5* 8.3* --  HCT 24.8* 27.6* 29.6*  PLT 197 196 200  APTT -- -- --  LABPROT 18.4* -- --  INR 1.58* -- --  HEPARINUNFRC 0.13* 0.31 0.14*  CREATININE -- 1.04 --  CKTOTAL -- -- --  CKMB -- -- --  TROPONINI -- -- --    Assessment: 78yo male now subtherapeutic on heparin after one level at goal at current rate; pt had bloody loose BM last pm but no further signs of bleeding, H/H lower but has been hovering around same during admission.  Goal of Therapy:  Heparin level 0.3-0.7 units/ml   Plan:  Will increase heparin gtt cautiously to 1000 units/hr given recent bleeding and check level in 8hr.  Colleen Can PharmD BCPS 02/12/2012,6:46 AM

## 2012-02-12 NOTE — Progress Notes (Signed)
ANTICOAGULATION CONSULT NOTE - Follow Up Consult  Pharmacy Consult for Heparin and Coumadin Indication: atrial fibrillation and mechanical mitral valve  No Known Allergies  Patient Measurements: Height: 5\' 7"  (170.2 cm) Weight: 148 lb 5.9 oz (67.3 kg) IBW/kg (Calculated) : 66.1  Heparin Dosing Weight: 67 kg  Vital Signs: Temp: 98 F (36.7 C) (12/06 1445) Temp src: Oral (12/06 1445) BP: 112/53 mmHg (12/06 1445) Pulse Rate: 87  (12/06 1445)  Labs:  Basename 02/12/12 1001 02/12/12 0535 02/11/12 0437 02/10/12 0450  HGB 7.1* 7.5* -- --  HCT 23.6* 24.8* 27.6* --  PLT -- 197 196 200  APTT -- -- -- --  LABPROT -- 18.4* -- --  INR -- 1.58* -- --  HEPARINUNFRC -- 0.13* 0.31 0.14*  CREATININE -- 1.41* 1.04 --  CKTOTAL -- -- -- --  CKMB -- -- -- --  TROPONINI -- -- -- --    Estimated Creatinine Clearance: 35.8 ml/min (by C-G formula based on Cr of 1.41).  Assessment:   Heparin drip off ~10:30 am due to rectal bleeding.   Coumadin resumed 12/5 with 7.5 mg. INR 1.58.   Transfusing 2 units PRBCs and 1 unit FFP.  Goal of Therapy:  Heparin level 0.3-0.7 units/ml INR 2.5-3.5 Monitor platelets by anticoagulation protocol: Yes   Plan:   Holding heparin and Coumadin due to rectal bleeding.  Will continue daily PT/INR, CBC.  Will follow up for resuming anticoagulation over the weekend.  Dennie Fetters, RPh Pager: [phone] 02/12/2012,3:10 PM

## 2012-02-12 NOTE — Progress Notes (Signed)
Hold anticoagulation Transfuse as needed. Would expect bleeding to stop with heparin off.  Once stable (like [place]), can attempt to restart.

## 2012-02-12 NOTE — Progress Notes (Signed)
NT called about pt incontinence episode and wanted to know if sample needed.  Checked pt and found to be pale with large amount of dark, bloody liquid stool.  Called MD Alfred Dennis and MD Alfred Dennis.  Vitals checked 97.5, 79/38 (right arm) and 82/42 (left arm), 88-110's afib, 20-22, and 98-100 RA.  Asked pt how he was feeling and he says "as to be expected".  I have explained to pt that labs ordered, will get 2 units of blood, heparin stopped and will transfer to 3300.  IV bolus of given.  Will continue to monitor. Alfred Dennis

## 2012-02-12 NOTE — Progress Notes (Signed)
Pt able to void on his own- 175 cc clear, amber urine.  Post void residual on bladder scan = 76 mL.  Foley not placed.  Will continue to monitor.    Maximino Greenland RN

## 2012-02-12 NOTE — Progress Notes (Signed)
Patient ID: Alfred Dennis, male   DOB: 03-06-1924, 78 y.o.   MRN: [id number] 2 Days Post-Op  Subjective: Pain only with transfers. Resting quietly by himself. Family arrived at end of visit. Patient remembers feeling slightly lightheaded before bloody BM last night (he says nursing told him it was bloody-didn't see it himself)   Objective: Vital signs in last 24 hours: Temp:  [98 F (36.7 C)-98.3 F (36.8 C)] 98.1 F (36.7 C) (12/05 2040) Pulse Rate:  [78-95] 84  (12/05 2040) Resp:  [18] 18  (12/05 2040) BP: (111-146)/(48-66) 111/63 mmHg (12/05 2241) SpO2:  [98 %-99 %] 98 % (12/05 2040) Last BM Date: 02/08/12  Intake/Output from previous day: 12/05 0701 - 12/06 0700 In: 305 [P.O.:60; I.V.:245] Out: 1927 [Urine:1925; Stool:2] Intake/Output this shift:    General appearance: alert, cooperative, appears stated age and no distress  Chest: CTA anteriorly. .  Cardiac: irregularly irregular, normal rate Abdomen: soft, BS present, appropriate (mild) tenderness to palpation, nondistended Extremities: 1+ edema   Lab Results:  Labs: wbc trending down, H&H with hgb reduction by 0.8. Marland Kitchen   Basename 02/12/12 0535 02/11/12 0437  WBC 9.0 10.0  HGB 7.5* 8.3*  HCT 24.8* 27.6*  PLT 197 196   BMET  Basename 02/12/12 0535 02/11/12 0437  NA 138 139  K 4.3 4.2  CL 103 105  CO2 25 25  GLUCOSE 133* 121*  BUN 17 13  CREATININE 1.41* 1.04  CALCIUM 8.9 9.1   PT/INR  Basename 02/12/12 0535  LABPROT 18.4*  INR 1.58*   ABG No results found for this basename: PHART:2,PCO2:2,PO2:2,HCO3:2 in the last 72 hours  Studies/Results: No results found.  Anti-infectives: Anti-infectives     Start     Dose/Rate Route Frequency Ordered Stop   02/10/12 1130   metroNIDAZOLE (FLAGYL) IVPB 500 mg  Status:  Discontinued        500 mg 100 mL/hr over 60 Minutes Intravenous To Surgery 02/10/12 1129 02/10/12 1604          Assessment/Plan: Patient Active Problem List  Diagnosis  . Chronic  a-fib  . CHF (congestive heart failure)  . ARF (acute renal failure)  . HTN (hypertension)  . S/P MVR (mitral valve replacement)  . GIB (gastrointestinal bleeding)  . Iron deficiency anemia  . Acute on chronic systolic CHF (congestive heart failure)  . Non-sustained ventricular tachycardia  . Carcinoma of colon  . Benign neoplasm of colon  . Gastritis  . Colonic mass  s/p Procedure(s) (LRB) with comments: LAPAROSCOPIC PARTIAL COLECTOMY (N/A) for transverse colon cancer and sessile cecal polyps POD #2  Plan:  1. Advance to full liquids for lunch then advance as tolerated 2. Foley removed this AM-UOP adequate yesterday 3. Heparin/coumadin per primary team/pharmacy due to mechanical MV. INR trending up. H+H slightly down. Will repeat CBC if another bloody BM.   4. Management of his other medical needs per medicine team.   LOS: 9 days    Tana Conch MD 02/12/2012

## 2012-02-13 LAB — TYPE AND SCREEN
Unit division: 0
Unit division: 0

## 2012-02-13 LAB — CBC
MCH: 26.4 pg (ref 26.0–34.0)
MCH: 26.5 pg (ref 26.0–34.0)
MCHC: 33.7 g/dL (ref 30.0–36.0)
MCHC: 33.6 g/dL (ref 30.0–36.0)
MCHC: 33.3 g/dL (ref 30.0–36.0)
MCV: 78.9 fL (ref 78.0–100.0)
Platelets: 130 10*3/uL — ABNORMAL LOW (ref 150–400)
Platelets: 147 10*3/uL — ABNORMAL LOW (ref 150–400)
Platelets: 141 10*3/uL — ABNORMAL LOW (ref 150–400)
RBC: 3.45 MIL/uL — ABNORMAL LOW (ref 4.22–5.81)
RDW: 19.3 % — ABNORMAL HIGH (ref 11.5–15.5)
RDW: 19.7 % — ABNORMAL HIGH (ref 11.5–15.5)
RDW: 19.9 % — ABNORMAL HIGH (ref 11.5–15.5)
WBC: 9.7 10*3/uL (ref 4.0–10.5)

## 2012-02-13 LAB — PROTIME-INR: Prothrombin Time: 18.7 seconds — ABNORMAL HIGH (ref 11.6–15.2)

## 2012-02-13 LAB — BASIC METABOLIC PANEL
CO2: 24 mEq/L (ref 19–32)
Calcium: 8.6 mg/dL (ref 8.4–10.5)
GFR calc Af Amer: 63 mL/min — ABNORMAL LOW (ref >90)
GFR calc non Af Amer: 54 mL/min — ABNORMAL LOW (ref >90)
Sodium: 140 mEq/L (ref 135–145)

## 2012-02-13 MED ORDER — METOPROLOL TARTRATE 25 MG PO TABS
25.0000 mg | ORAL_TABLET | Freq: Two times a day (BID) | ORAL | Status: DC
  Filled 2012-02-13 (×2): qty 1

## 2012-02-13 MED ORDER — ZOLPIDEM TARTRATE 5 MG PO TABS: 5.0000 mg | ORAL_TABLET | Freq: Every evening | ORAL | Status: DC | PRN

## 2012-02-13 MED ORDER — SODIUM CHLORIDE 0.9 % IJ SOLN
INTRAMUSCULAR | Status: AC
  Filled 2012-02-13: qty 20

## 2012-02-13 MED ORDER — DIPHENHYDRAMINE HCL 12.5 MG/5ML PO ELIX
25.0000 mg | ORAL_SOLUTION | Freq: Every evening | ORAL | Status: DC | PRN
  Administered 2012-02-13: 25 mg via ORAL
  Filled 2012-02-13 (×4): qty 10

## 2012-02-13 MED ORDER — WARFARIN SODIUM 5 MG PO TABS
5.0000 mg | ORAL_TABLET | Freq: Once | ORAL | Status: DC
  Filled 2012-02-13: qty 1

## 2012-02-13 MED ORDER — SODIUM CHLORIDE 0.9 % IV SOLN
INTRAVENOUS | Status: DC
  Administered 2012-02-13: 13:00:00 via INTRAVENOUS
  Filled 2012-02-13 (×4): qty 1000

## 2012-02-13 MED ORDER — METOPROLOL TARTRATE 25 MG PO TABS
25.0000 mg | ORAL_TABLET | Freq: Two times a day (BID) | ORAL | Status: DC
  Administered 2012-02-13 – 2012-02-14 (×3): 25 mg via ORAL
  Filled 2012-02-13 (×4): qty 1

## 2012-02-13 NOTE — Progress Notes (Signed)
Pt with mx frequent PVC's (strip in chart), pt VSS and asymptomatic, stat EKG done per protocol, MD/N Jomarie Longs, new order received, nusring will cont to monitor pt closely

## 2012-02-13 NOTE — Progress Notes (Signed)
3 Days Post-Op  Subjective: Appears comfortable, eating breakfast, wife and daughter at bedside. Offers no new c/o.  Objective: Vital signs in last 24 hours: Temp:  [97.5 F (36.4 C)-98.1 F (36.7 C)] 97.6 F (36.4 C) (12/07 0808) Pulse Rate:  [41-109] 84  (12/07 0500) Resp:  [14-20] 15  (12/07 0500) BP: (71-139)/(36-73) 122/49 mmHg (12/07 0500) SpO2:  [96 %-100 %] 99 % (12/06 1730) Weight:  [148 lb 5.9 oz (67.3 kg)] 148 lb 5.9 oz (67.3 kg) (12/06 1049) Last BM Date: 02/12/12  Intake/Output from previous day: 12/06 0701 - 12/07 0700 In: 2384.2 [I.V.:1230; Blood:1154.2] Out: 375 [Urine:375] Intake/Output this shift: Total I/O In: -  Out: 100 [Urine:100]  General appearance: alert, cooperative, appears stated age and no distress Chest: CTA bilaterally Cardiac: RRR Abdomen: surgical incison site with minimal erythema, stales intact, no drainage; abdomen is slightly distended, tympanic. + BS, flatus, BM No recent bloody stools. Extremities: warm to touch, no edema or tenderness, + Pulses. Labs: WBC has trended up slightly, H& H is improved, PLT down slightly, BUN has increased slightly, Creatine improved. INR currently 1.62 down from 1.72 on 12/6.  Lab Results:   [facility] 02/13/12 0630 02/12/12 1825  WBC 9.4 8.6  HGB 9.1* 8.0*  HCT 27.0* 24.1*  PLT 130* 159   BMET  Basename 02/13/12 0630 02/12/12 0535  NA 140 138  K 4.1 4.3  CL 109 103  CO2 24 25  GLUCOSE 116* 133*  BUN 28* 17  CREATININE 1.18 1.41*  CALCIUM 8.6 8.9   PT/INR  Basename 02/13/12 0630 02/12/12 1825  LABPROT 18.7* 19.6*  INR 1.62* 1.72*   ABG No results found for this basename: PHART:2,PCO2:2,PO2:2,HCO3:2 in the last 72 hours  Studies/Results: Dg Chest Port 1 View  02/12/2012  *RADIOLOGY REPORT*  Clinical Data: PICC placement.  PORTABLE CHEST - 1 VIEW  Comparison: 02/03/2012.  Findings: Interval right PICC with its tip in the upper right atrium.  Stable enlargement of the cardiac silhouette and  elevation of the left hemidiaphragm.  Stable post CABG changes and prosthetic heart valve.  Minimal linear scarring at the right lung base is unchanged.  IMPRESSION:  1.  Right PICC tip in the upper right atrium.  If a position at the cavoatrial junction is desired, this could be retracted 1.5 cm. 2.  Stable cardiomegaly, elevated left hemidiaphragm and minimal right basilar scarring.   Original Report Authenticated By: Beckie Salts, M.D.     Anti-infectives: Anti-infectives     Start     Dose/Rate Route Frequency Ordered Stop   02/10/12 1130   metroNIDAZOLE (FLAGYL) IVPB 500 mg  Status:  Discontinued        500 mg 100 mL/hr over 60 Minutes Intravenous To Surgery 02/10/12 1129 02/10/12 1604          Assessment/Plan:    Patient Active Problem List  Diagnosis  . Chronic a-fib  . CHF (congestive heart failure)  . ARF (acute renal failure)  . HTN (hypertension)  . S/P MVR (mitral valve replacement)  . GIB (gastrointestinal bleeding)  . Iron deficiency anemia  . Acute on chronic systolic CHF (congestive heart failure)  . Non-sustained ventricular tachycardia  . Carcinoma of colon  . Benign neoplasm of colon  . Gastritis  . Colonic mass  . Hypotension   s/p Procedure(s) (LRB) with comments: LAPAROSCOPIC PARTIAL COLECTOMY (N/A)  Plan: 1. Continue to monitor H&H q8hrs 2. Continue to advance diet as tolerated 3. OOB/Ambulate 4. Management of his other needs  per medicine 5. Continue IVF as UOP remains low   LOS: 10 days    Golda Acre [facility] Surgery Pager [phone]  02/13/2012

## 2012-02-13 NOTE — Progress Notes (Signed)
[facility] PROGRESS NOTE  Alfred Dennis ZOX:[id number] DOB: 10/23/1923 DOA: 02/03/2012 PCP: No primary provider on file.  Assessment/Plan: Acute on chronic systolic CHF  -EF-25-30  -resume metoprolol, lasix on hold due to hypotension  -Appreciate cardiology recommendations  -Daily weights, strict I.'s and O.'s  -neg 7.1L for the admission  -TSH 3.922  - cards following  Adenocarcinoma of Transverse colon -Noted on colonoscopy  -s/p R-hemicolectomy 12/4 per [facility]  NSVTach : again this am and 12/5, BB was stopped yesterday due to hypotension, resume metoprolol today, K ok -due to cardiomyopathy   Blood loss Anemia/Iron deficiency  Recurrent Gi bleed this am, hold heparin gtt for today, unfortunately with metallic mitral valve need heparin resumed soon if possible, discussed with surgery this am, resume IV heparin later today. -Transfused 2 units PRBCs 11/27--hemoglobin dropped by 2 grams 12/6, transfused 2 units PRBC 12/6 -continue iron supplementation   Chronic atrial fibrillation/history of St. Jude mitral valve replacement  -Continue metoprolol succinate-dose increased  -Rate controlled currently  -IV heparin to be resumed this afternoon, was on hold yesterday due to active bleeding/hypotension    Procedures:  Colonoscopy and EGD 02/08/2012 Laparoscopic partial colectomy for transverse colon cancer and sessile cecal polyps 02/10/12   Family Communication:   Patient at bedside Disposition Plan:   Transfer to tele    Procedures/Studies: Dg Chest 2 View  02/03/2012  *RADIOLOGY REPORT*  Clinical Data: Short of breath.  Weakness.  CHEST - 2 VIEW  Comparison: None.  Findings: Cardiomegaly.  Pulmonary vascular congestion.  Elevation of the left hemidiaphragm.  Left basilar atelectasis.  No focal consolidation.  Mitral valve replacement.  Interstitial pulmonary edema is present.  IMPRESSION: Cardiomegaly, pulmonary vascular congestion and interstitial pulmonary edema  compatible with mild CHF.   Original Report Authenticated By: Andreas Newport, M.D.    Ct Chest W Contrast  02/09/2012  *RADIOLOGY REPORT*  Clinical Data:  Status post colonoscopy 02/08/2012 where a circumferential ulcerated mass was identified transverse colon.  CT CHEST, ABDOMEN AND PELVIS WITH CONTRAST  Technique:  Multidetector CT imaging of the chest, abdomen and pelvis was performed following the standard protocol during bolus administration of intravenous contrast.  Contrast: OMNIPAQUE IOHEXOL 300 MG/ML  SOLN  Comparison:  Plain films of the chest 02/03/2012.  CT CHEST  Findings:  There is no axillary, hilar or mediastinal lymphadenopathy.  Trace pleural fluid on the left is noted.  There is no right pleural effusion or pericardial effusion.  Massive cardiomegaly is identified.  The patient is status post CABG and mitral valve replacement.  No pulmonary nodule or mass is identified.  There is some mild dependent atelectasis.  No focal bony abnormality is identified.  IMPRESSION:  1.  Negative for metastatic or acute disease. 2.  Massive cardiomegaly.  CT ABDOMEN AND PELVIS  Findings:  The liver, adrenal glands, spleen, pancreas and kidneys all appear normal.  Multiple stones are seen within the gallbladder but there is no CT evidence of cholecystitis.  The patient has a left inguinal hernia containing a loop of descending colon.  No obstruction or other complicating feature is identified.  Scattered colonic diverticula without diverticulitis are noted.  Focal wall thickening is seen in the transverse colon just distal to the hepatic flexure which may represent the patient's mass seen on colonoscopy. The walls of the rectosigmoid colon and cecum are thickened although these loops are under distended.  Stomach, small bowel and appendix are normal in appearance.  The prostate gland is mildly enlarged.  Urinary bladder appears normal.  There is no lymphadenopathy or fluid.  No focal bony abnormality is  identified.  IMPRESSION:  1.  Negative for acute or metastatic disease. 2.  Wall thickening in the transverse colon just distal to the hepatic flexure may represent the patient's known colon mass.  Wall thickening of the cecum and rectosigmoid colon is likely due to under distention.  3.  Mild enlargement of the prostate gland. 4.  Diverticulosis without diverticulitis. 5.  Left inguinal hernia contains a knuckle of colon without obstruction or other complicating feature. 6.  Gallstones without cholecystitis.   Original Report Authenticated By: Holley Dexter, M.D.    Ct Abdomen Pelvis W Contrast  02/09/2012  *RADIOLOGY REPORT*  Clinical Data:  Status post colonoscopy 02/08/2012 where a circumferential ulcerated mass was identified transverse colon.  CT CHEST, ABDOMEN AND PELVIS WITH CONTRAST  Technique:  Multidetector CT imaging of the chest, abdomen and pelvis was performed following the standard protocol during bolus administration of intravenous contrast.  Contrast: OMNIPAQUE IOHEXOL 300 MG/ML  SOLN  Comparison:  Plain films of the chest 02/03/2012.  CT CHEST  Findings:  There is no axillary, hilar or mediastinal lymphadenopathy.  Trace pleural fluid on the left is noted.  There is no right pleural effusion or pericardial effusion.  Massive cardiomegaly is identified.  The patient is status post CABG and mitral valve replacement.  No pulmonary nodule or mass is identified.  There is some mild dependent atelectasis.  No focal bony abnormality is identified.  IMPRESSION:  1.  Negative for metastatic or acute disease. 2.  Massive cardiomegaly.  CT ABDOMEN AND PELVIS  Findings:  The liver, adrenal glands, spleen, pancreas and kidneys all appear normal.  Multiple stones are seen within the gallbladder but there is no CT evidence of cholecystitis.  The patient has a left inguinal hernia containing a loop of descending colon.  No obstruction or other complicating feature is identified.  Scattered colonic  diverticula without diverticulitis are noted.  Focal wall thickening is seen in the transverse colon just distal to the hepatic flexure which may represent the patient's mass seen on colonoscopy. The walls of the rectosigmoid colon and cecum are thickened although these loops are under distended.  Stomach, small bowel and appendix are normal in appearance.  The prostate gland is mildly enlarged.  Urinary bladder appears normal.  There is no lymphadenopathy or fluid.  No focal bony abnormality is identified.  IMPRESSION:  1.  Negative for acute or metastatic disease. 2.  Wall thickening in the transverse colon just distal to the hepatic flexure may represent the patient's known colon mass.  Wall thickening of the cecum and rectosigmoid colon is likely due to under distention.  3.  Mild enlargement of the prostate gland. 4.  Diverticulosis without diverticulitis. 5.  Left inguinal hernia contains a knuckle of colon without obstruction or other complicating feature. 6.  Gallstones without cholecystitis.   Original Report Authenticated By: Holley Dexter, M.D.          Subjective:  No more bleeding since yesterday am, otherwise feels ok,  pt denies any chest pain, SoB  Objective: Filed Vitals:   02/13/12 0315 02/13/12 0415 02/13/12 0500 02/13/12 0808  BP: 122/45 124/51 122/49   Pulse: 68 75 84   Temp: 97.5 F (36.4 C) 97.9 F (36.6 C) 97.8 F (36.6 C) 97.6 F (36.4 C)  TempSrc: Oral Oral Oral Oral  Resp: 14 15 15    Height:  Weight:      SpO2:        Intake/Output Summary (Last 24 hours) at 02/13/12 1027 Last data filed at 02/13/12 0981  Gross per 24 hour  Intake 2384.17 ml  Output    475 ml  Net 1909.17 ml   Weight change:  Exam:   General:  Pt is alert, follows commands appropriately, not in acute distress  HEENT: No icterus, No thrush, No neck mass, [place]/AT  Cardiovascular: RRR, S1/S2, metallic click, no rubs, no gallops  Respiratory: CTA bilaterally, no wheezing, no  crackles, no rhonchi  Abdomen: Soft, surgical incision noted, mild  tenderness as expected, non distended, no guarding  Extremities: No edema, No lymphangitis, No petechiae, No rashes, no synovitis  Data Reviewed: Basic Metabolic Panel:  Lab 02/13/12 1914 02/12/12 0535 02/11/12 0437 02/08/12 0445  NA 140 138 139 142  K 4.1 4.3 4.2 4.5  CL 109 103 105 108  CO2 24 25 25 25   GLUCOSE 116* 133* 121* 95  BUN 28* 17 13 23   CREATININE 1.18 1.41* 1.04 1.20  CALCIUM 8.6 8.9 9.1 9.3  MG -- -- -- --  PHOS -- -- -- --   Liver Function Tests: No results found for this basename: AST:5,ALT:5,ALKPHOS:5,BILITOT:5,PROT:5,ALBUMIN:5 in the last 168 hours No results found for this basename: LIPASE:5,AMYLASE:5 in the last 168 hours No results found for this basename: AMMONIA:5 in the last 168 hours CBC:  Lab 02/13/12 0630 02/12/12 1825 02/12/12 1001 02/12/12 0535 02/11/12 0437 02/10/12 0450  WBC 9.4 8.6 -- 9.0 10.0 4.7  NEUTROABS -- -- -- -- -- --  HGB 9.1* 8.0* 7.1* 7.5* 8.3* --  HCT 27.0* 24.1* 23.6* 24.8* 27.6* --  MCV 78.3 77.5* -- 75.8* 76.5* 76.1*  PLT 130* 159 -- 197 196 200   Cardiac Enzymes: No results found for this basename: CKTOTAL:5,CKMB:5,CKMBINDEX:5,TROPONINI:5 in the last 168 hours BNP: No components found with this basename: POCBNP:5 CBG:  Lab 02/08/12 1112  GLUCAP 116*    Recent Results (from the past 240 hour(s))  SURGICAL PCR SCREEN     Status: Normal   Collection Time   02/10/12  5:14 AM      Component Value Range Status Comment   MRSA, PCR NEGATIVE  NEGATIVE Final    Staphylococcus aureus NEGATIVE  NEGATIVE Final      Scheduled Meds:    . alvimopan  12 mg Oral BID  . metoprolol tartrate  25 mg Oral BID  . pantoprazole  40 mg Oral QAC lunch  . simvastatin  10 mg Oral q1800  . sodium chloride  3 mL Intravenous Q12H  . [COMPLETED] sodium chloride      . warfarin  5 mg Oral ONCE-1800  . Warfarin - Pharmacist Dosing Inpatient   Does not apply q1800  .  [DISCONTINUED] furosemide  40 mg Oral Daily  . [DISCONTINUED] metoprolol succinate  50 mg Oral Daily  . [DISCONTINUED] metoprolol tartrate  25 mg Oral BID  . [DISCONTINUED] phytonadione (VITAMIN K) IV  5 mg Intravenous Once  . [DISCONTINUED] Warfarin - Pharmacist Dosing Inpatient   Does not apply q1800   Continuous Infusions:    . sodium chloride 0.9 % 1,000 mL with potassium chloride 20 mEq infusion 100 mL/hr at 02/12/12 1821  . [DISCONTINUED] heparin Stopped (02/12/12 0940)  . [DISCONTINUED] heparin       Zannie Cove, MD  [facility] Pager [phone]  If 7PM-7AM, please contact night-coverage [email or web address] Password [email or web address] 02/13/2012, 10:27 AM   LOS: 10 days

## 2012-02-13 NOTE — Progress Notes (Signed)
Patient Name: Alfred Dennis      SUBJECTIVE: Admitted with weakness and GI bleeding  in the setting of mechanical St. Jude mitral and prior CABG, persistent atrial fibrillation chronic heart failure with now worsening left ventricular function EF 20-25%  Was found   colon cancer and underwent laparoscopic partial colectomy .  Problems with postoperative bleeding resulted in hypotension at least in part related to heparin anticoagulation for the mitral valve anticoagulation with Coumadin remains ongoing   Past Medical History  Diagnosis Date  . Hypertension   . Shortness of breath     "w/any activity lately" (02/03/2012)  . CHF (congestive heart failure)     mild/note 02/03/2012  . Arthritis     "left foot; fingers" (02/03/2012)  . Dysrhythmia     PHYSICAL EXAM Filed Vitals:   02/13/12 0315 02/13/12 0415 02/13/12 0500 02/13/12 0808  BP: 122/45 124/51 122/49   Pulse: 68 75 84   Temp: 97.5 F (36.4 C) 97.9 F (36.6 C) 97.8 F (36.6 C) 97.6 F (36.4 C)  TempSrc: Oral Oral Oral Oral  Resp: 14 15 15    Height:      Weight:      SpO2:        General appearance: alert, cooperative and no distress Lungs: cradckles L>R basilar Heart: irregularly irregular rhythm mechanical S1 with 2/6 syst m Abdomen: soft non tender Extremities: edema tr Skin: Skin color, texture, turgor normal. No rashes or lesions Neurologic: Grossly normal  TELEMETRY: Reviewed telemetry pt in afib RVR:    Intake/Output Summary (Last 24 hours) at 02/13/12 0810 Last data filed at 02/13/12 0808  Gross per 24 hour  Intake 2384.17 ml  Output    475 ml  Net 1909.17 ml    LABS: Basic Metabolic Panel:  Lab 02/13/12 1610 02/12/12 0535 02/11/12 0437 02/08/12 0445  NA 140 138 139 142  K 4.1 4.3 4.2 4.5  CL 109 103 105 108  CO2 24 25 25 25   GLUCOSE 116* 133* 121* 95  BUN 28* 17 13 23   CREATININE 1.18 1.41* 1.04 1.20  CALCIUM 8.6 8.9 -- --  MG -- -- -- --  PHOS -- -- -- --   Cardiac  Enzymes: No results found for this basename: CKTOTAL:3,CKMB:3,CKMBINDEX:3,TROPONINI:3 in the last 72 hours CBC:  Lab 02/13/12 0630 02/12/12 1825 02/12/12 1001 02/12/12 0535 02/11/12 0437 02/10/12 0450 02/09/12 0547 02/08/12 0445  WBC 9.4 8.6 -- 9.0 10.0 4.7 7.7 5.4  NEUTROABS -- -- -- -- -- -- -- --  HGB 9.1* 8.0* 7.1* 7.5* 8.3* 9.0* 8.8* --  HCT 27.0* 24.1* 23.6* 24.8* 27.6* 29.6* 29.1* --  MCV 78.3 77.5* -- 75.8* 76.5* 76.1* 75.6* 75.2*  PLT 130* 159 -- 197 196 200 213 225   PROTIME:  Basename 02/13/12 0630 02/12/12 1825 02/12/12 0535  LABPROT 18.7* 19.6* 18.4*  INR 1.62* 1.72* 1.58*   Liver Function Tests: No results found for this basename: AST:2,ALT:2,ALKPHOS:2,BILITOT:2,PROT:2,ALBUMIN:2 in the last 72 hours No results found for this basename: LIPASE:2,AMYLASE:2 in the last 72 hours BNP: BNP (last 3 results)  Basename 02/03/12 1113  PROBNP 3809.0*      ASSESSMENT AND PLAN:  Patient Active Hospital Problem List: CHF (congestive heart failure) (02/03/2012)   Chronic a-fib (02/03/2012)   S/P MVR (mitral valve replacement) (02/03/2012)   GIB (gastrointestinal bleeding) (02/03/2012)   Non-sustained ventricular tachycardia (02/06/2012)    Pt relatively stable Will resume betablocker for rate control cardiomyoapty and VTNS  Will strart w metop tart  and switch to succinte Anticoagulation is coming,  On coumadin but not heparin 2/2 GiB reviewed with family    Signed, Sherryl Manges MD  02/13/2012

## 2012-02-13 NOTE — Progress Notes (Signed)
Agree with above.  Had bloody BM last night in addition to ones yesterday.  INR still elevated. Holding anticoagulation. Hope to see resolution of GI bleed today.

## 2012-02-13 NOTE — Progress Notes (Deleted)
MD notified of HR 100-130 sustained a-fib.  No new orders given.  Will continue to monitor.   Maximino Greenland RN

## 2012-02-13 NOTE — Progress Notes (Signed)
Pt with 7 beats of PVC's, pt VSS and asymptomatic,MD/N [facility], no new orders received

## 2012-02-13 NOTE — Progress Notes (Addendum)
ANTICOAGULATION CONSULT NOTE - Follow Up Consult  Pharmacy Consult for Heparin and Coumadin Indication: atrial fibrillation and mechanical mitral valve  No Known Allergies  Patient Measurements: Height: 5\' 7"  (170.2 cm) Weight: 148 lb 5.9 oz (67.3 kg) IBW/kg (Calculated) : 66.1  Heparin Dosing Weight: 67 kg  Vital Signs: Temp: 97.6 F (36.4 C) (12/07 0808) Temp src: Oral (12/07 0808) BP: 122/49 mmHg (12/07 0500) Pulse Rate: 84  (12/07 0500)  Labs:  Basename 02/13/12 0630 02/12/12 1825 02/12/12 1001 02/12/12 0535 02/11/12 0437  HGB 9.1* 8.0* -- -- --  HCT 27.0* 24.1* 23.6* -- --  PLT 130* 159 -- 197 --  APTT -- 33 -- -- --  LABPROT 18.7* 19.6* -- 18.4* --  INR 1.62* 1.72* -- 1.58* --  HEPARINUNFRC -- -- -- 0.13* 0.31  CREATININE 1.18 -- -- 1.41* 1.04  CKTOTAL -- -- -- -- --  CKMB -- -- -- -- --  TROPONINI -- -- -- -- --    Estimated Creatinine Clearance: 42 ml/min (by C-G formula based on Cr of 1.18).  Assessment: 78yo male with h/o afib, MVR St. Jude valve admitted with worsening SOB.  He had a laparoscopy with colectomy due to GI bleed with some post-op bleeding complications while on Warfarin and Heparin.  Both were held and he received 2 units PRBCs and 1 unit FFP.  Today his INR is 1.62 and last dose of Warfarin was 7.5mg  on 12/5.  His H/H are trending upward after the 2ut. PRBC, but still low.  He still has thrombocytopenia with platelets this morning of 130K.  Paged Dr. Graciela Husbands regarding anticoagulation, he wants [place] to resume Warfarin today.  HOME dose:  Warfarin 5mg  every TTHS and w.5mg  MWFSu  Drug/Drug Interactions:  Reviewed current medication regimen and there are no noted major drug/drug interactions.  Goal of Therapy:  INR 2.5-3.5 (will strive for 2.5-3.0 given GIB) Monitor platelets by anticoagulation protocol: Yes   Plan:   Warfarin 5mg  PO x 1 today.  Continue daily PT/INR, CBC.  Monitor CBC closely as well as s/s of bleeding.   Nadara Mustard,  PharmD., MS Clinical Pharmacist Pager:  [phone] Thank you for allowing pharmacy to be part of this patients care team. 02/13/2012,9:30 AM

## 2012-02-14 LAB — CBC
HCT: 29.6 % — ABNORMAL LOW (ref 39.0–52.0)
Hemoglobin: 9.7 g/dL — ABNORMAL LOW (ref 13.0–17.0)
MCH: 26.7 pg (ref 26.0–34.0)
MCHC: 33.3 g/dL (ref 30.0–36.0)
MCV: 81.7 fL (ref 78.0–100.0)
MCV: 81.5 fL (ref 78.0–100.0)
Platelets: 139 10*3/uL — ABNORMAL LOW (ref 150–400)
RBC: 3.63 MIL/uL — ABNORMAL LOW (ref 4.22–5.81)
RDW: 20.3 % — ABNORMAL HIGH (ref 11.5–15.5)
WBC: 9.2 10*3/uL (ref 4.0–10.5)
WBC: 9.4 10*3/uL (ref 4.0–10.5)

## 2012-02-14 LAB — BASIC METABOLIC PANEL
BUN: 21 mg/dL (ref 6–23)
Chloride: 112 mEq/L (ref 96–112)
Creatinine, Ser: 0.92 mg/dL (ref 0.50–1.35)
GFR calc Af Amer: 86 mL/min — ABNORMAL LOW (ref >90)
GFR calc non Af Amer: 74 mL/min — ABNORMAL LOW (ref >90)
Potassium: 4.3 mEq/L (ref 3.5–5.1)

## 2012-02-14 LAB — PREPARE FRESH FROZEN PLASMA: Unit division: 0

## 2012-02-14 LAB — PROTIME-INR: INR: 1.69 — ABNORMAL HIGH (ref 0.00–1.49)

## 2012-02-14 MED ORDER — DIPHENHYDRAMINE HCL 12.5 MG/5ML PO ELIX
6.2500 mg | ORAL_SOLUTION | Freq: Every evening | ORAL | Status: DC | PRN
  Administered 2012-02-14 – 2012-02-15 (×2): 12.5 mg via ORAL
  Filled 2012-02-14 (×11): qty 5

## 2012-02-14 MED ORDER — SODIUM CHLORIDE 0.9 % IJ SOLN
INTRAMUSCULAR | Status: AC
  Filled 2012-02-14: qty 10

## 2012-02-14 MED ORDER — WARFARIN - PHARMACIST DOSING INPATIENT
Freq: Every day | Status: DC
  Administered 2012-02-14: 18:00:00

## 2012-02-14 MED ORDER — HEPARIN (PORCINE) IN NACL 100-0.45 UNIT/ML-% IJ SOLN
1200.0000 [IU]/h | INTRAMUSCULAR | Status: DC
  Administered 2012-02-14: 900 [IU]/h via INTRAVENOUS
  Administered 2012-02-14: 1100 [IU]/h via INTRAVENOUS
  Administered 2012-02-15 – 2012-02-17 (×3): 1200 [IU]/h via INTRAVENOUS
  Filled 2012-02-14 (×10): qty 250

## 2012-02-14 MED ORDER — METOPROLOL TARTRATE 50 MG PO TABS
50.0000 mg | ORAL_TABLET | Freq: Two times a day (BID) | ORAL | Status: DC
  Administered 2012-02-14 – 2012-02-16 (×4): 50 mg via ORAL
  Filled 2012-02-14 (×6): qty 1

## 2012-02-14 MED ORDER — OXYCODONE-ACETAMINOPHEN 5-325 MG PO TABS
1.0000 | ORAL_TABLET | ORAL | Status: DC | PRN
  Filled 2012-02-14: qty 1

## 2012-02-14 MED ORDER — FUROSEMIDE 10 MG/ML IJ SOLN
40.0000 mg | Freq: Once | INTRAMUSCULAR | Status: AC
  Administered 2012-02-14: 40 mg via INTRAVENOUS
  Filled 2012-02-14: qty 4

## 2012-02-14 MED ORDER — ZOLPIDEM TARTRATE 5 MG PO TABS: 5.0000 mg | ORAL_TABLET | Freq: Every evening | ORAL | Status: DC | PRN

## 2012-02-14 MED ORDER — WARFARIN SODIUM 2.5 MG PO TABS
2.5000 mg | ORAL_TABLET | Freq: Once | ORAL | Status: AC
  Administered 2012-02-14: 2.5 mg via ORAL
  Filled 2012-02-14: qty 1

## 2012-02-14 NOTE — Progress Notes (Signed)
ANTICOAGULATION CONSULT NOTE - Follow Up Consult  Pharmacy Consult for Heparin and Coumadin Indication: atrial fibrillation and mechanical mitral valve  No Known Allergies  Patient Measurements: Height: 5\' 7"  (170.2 cm) Weight: 148 lb 5.9 oz (67.3 kg) IBW/kg (Calculated) : 66.1  Heparin Dosing Weight: 67 kg  Vital Signs: Temp: 98 F (36.7 C) (12/08 1125) Temp src: Oral (12/08 1125) BP: 126/55 mmHg (12/08 1125) Pulse Rate: 97  (12/08 1125)  Labs:  Basename 02/14/12 0420 02/13/12 2150 02/13/12 1320 02/13/12 0630 02/12/12 1825 02/12/12 0535  HGB 9.7* 9.6* -- -- -- --  HCT 29.1* 28.8* 27.7* -- -- --  PLT 139* 141* 147* -- -- --  APTT -- -- -- -- 33 --  LABPROT 19.3* -- -- 18.7* 19.6* --  INR 1.69* -- -- 1.62* 1.72* --  HEPARINUNFRC -- -- -- -- -- 0.13*  CREATININE 0.92 -- -- 1.18 -- 1.41*  CKTOTAL -- -- -- -- -- --  CKMB -- -- -- -- -- --  TROPONINI -- -- -- -- -- --    Estimated Creatinine Clearance: 53.9 ml/min (by C-G formula based on Cr of 0.92).  Assessment: 78yo male with h/o afib, MVR St. Jude valve admitted with worsening SOB.  He had a laparoscopy with colectomy due to GI bleed with some post-op bleeding complications while on Warfarin and Heparin.  Both were held and he received 2 units PRBCs and 1 unit FFP.  Today his INR is 1.69 and last dose of Warfarin was 7.5mg  on 12/5.  His H/H are trending upward after the 2ut. PRBC, but still low.  He still has thrombocytopenia with platelets this morning of 139K.  The plan is to cautiously restart his anticoagulation today with close monitoring.  Will target lower end of ranges for both Warfarin and Heparin.  HOME dose:  Warfarin 5mg  every TTHS and w.5mg  MWFSu  Drug/Drug Interactions:  Reviewed current medication regimen and there are no noted major drug/drug interactions.  Goal of Therapy:  INR 2.5-3.5 (will strive for 2.5-3.0 given GIB) Monitor platelets by anticoagulation protocol: Yes Heparin level 0.3-0.5   Plan:    Warfarin 2.5mg  PO x 1 today.  Continue daily PT/INR, CBC.  Monitor CBC closely as well as s/s of bleeding.  Begin IV heparin at 900 units/hr  Check a level in 8 hours to ensure therapeutic response.   Nadara Mustard, PharmD., MS Clinical Pharmacist Pager:  [phone] Thank you for allowing pharmacy to be part of this patients care team. 02/14/2012,12:29 PM

## 2012-02-14 NOTE — Progress Notes (Signed)
[facility] PROGRESS NOTE  Alfred Dennis ZOX:[id number] DOB: July 02, 1923 DOA: 02/03/2012 PCP: No primary provider on file.  Assessment/Plan: Acute on chronic systolic CHF  -EF-25-30  - increase metoprolol to 50mg  BID, lasix per cards -Appreciate cardiology recommendations  -Daily weights, strict I.'s and O.'s  -neg 5.4L for the admission  -TSH 3.922  - cards following  Adenocarcinoma of Transverse colon -Noted on colonoscopy  -s/p R-hemicolectomy 12/4 per [facility] -stop iVF, urine output up  NSVTach : again this am and 12/5, BB was stopped 12/6, increase metoprolol today, K ok -due to cardiomyopathy   Blood loss Anemia/Iron deficiency  Recurrent Gi bleed this 12/6, resume IV heparin today , i called and discussed with surgery. -Transfused 2 units PRBCs 11/27--hemoglobin dropped by 2 grams 12/6, transfused 2 units PRBC 12/6 -continue iron supplementation   Chronic atrial fibrillation/history of St. Jude mitral valve replacement  -Continue metoprolol dose increased  -Rate controlled currently  - restart IV heparin without bolus and coumadin    Procedures:  Colonoscopy and EGD 02/08/2012 Laparoscopic partial colectomy for transverse colon cancer and sessile cecal polyps 02/10/12   Family Communication:   Patient at bedside Disposition Plan:   Transfer to tele    Procedures/Studies: Dg Chest 2 View  02/03/2012  *RADIOLOGY REPORT*  Clinical Data: Short of breath.  Weakness.  CHEST - 2 VIEW  Comparison: None.  Findings: Cardiomegaly.  Pulmonary vascular congestion.  Elevation of the left hemidiaphragm.  Left basilar atelectasis.  No focal consolidation.  Mitral valve replacement.  Interstitial pulmonary edema is present.  IMPRESSION: Cardiomegaly, pulmonary vascular congestion and interstitial pulmonary edema compatible with mild CHF.   Original Report Authenticated By: Andreas Newport, M.D.    Ct Chest W Contrast  02/09/2012  *RADIOLOGY REPORT*  Clinical Data:  Status  post colonoscopy 02/08/2012 where a circumferential ulcerated mass was identified transverse colon.  CT CHEST, ABDOMEN AND PELVIS WITH CONTRAST  Technique:  Multidetector CT imaging of the chest, abdomen and pelvis was performed following the standard protocol during bolus administration of intravenous contrast.  Contrast: OMNIPAQUE IOHEXOL 300 MG/ML  SOLN  Comparison:  Plain films of the chest 02/03/2012.  CT CHEST  Findings:  There is no axillary, hilar or mediastinal lymphadenopathy.  Trace pleural fluid on the left is noted.  There is no right pleural effusion or pericardial effusion.  Massive cardiomegaly is identified.  The patient is status post CABG and mitral valve replacement.  No pulmonary nodule or mass is identified.  There is some mild dependent atelectasis.  No focal bony abnormality is identified.  IMPRESSION:  1.  Negative for metastatic or acute disease. 2.  Massive cardiomegaly.  CT ABDOMEN AND PELVIS  Findings:  The liver, adrenal glands, spleen, pancreas and kidneys all appear normal.  Multiple stones are seen within the gallbladder but there is no CT evidence of cholecystitis.  The patient has a left inguinal hernia containing a loop of descending colon.  No obstruction or other complicating feature is identified.  Scattered colonic diverticula without diverticulitis are noted.  Focal wall thickening is seen in the transverse colon just distal to the hepatic flexure which may represent the patient's mass seen on colonoscopy. The walls of the rectosigmoid colon and cecum are thickened although these loops are under distended.  Stomach, small bowel and appendix are normal in appearance.  The prostate gland is mildly enlarged.  Urinary bladder appears normal.  There is no lymphadenopathy or fluid.  No focal bony abnormality is identified.  IMPRESSION:  1.  Negative for acute or metastatic disease. 2.  Wall thickening in the transverse colon just distal to the hepatic flexure may represent  the patient's known colon mass.  Wall thickening of the cecum and rectosigmoid colon is likely due to under distention.  3.  Mild enlargement of the prostate gland. 4.  Diverticulosis without diverticulitis. 5.  Left inguinal hernia contains a knuckle of colon without obstruction or other complicating feature. 6.  Gallstones without cholecystitis.   Original Report Authenticated By: Holley Dexter, M.D.    Ct Abdomen Pelvis W Contrast  02/09/2012  *RADIOLOGY REPORT*  Clinical Data:  Status post colonoscopy 02/08/2012 where a circumferential ulcerated mass was identified transverse colon.  CT CHEST, ABDOMEN AND PELVIS WITH CONTRAST  Technique:  Multidetector CT imaging of the chest, abdomen and pelvis was performed following the standard protocol during bolus administration of intravenous contrast.  Contrast: OMNIPAQUE IOHEXOL 300 MG/ML  SOLN  Comparison:  Plain films of the chest 02/03/2012.  CT CHEST  Findings:  There is no axillary, hilar or mediastinal lymphadenopathy.  Trace pleural fluid on the left is noted.  There is no right pleural effusion or pericardial effusion.  Massive cardiomegaly is identified.  The patient is status post CABG and mitral valve replacement.  No pulmonary nodule or mass is identified.  There is some mild dependent atelectasis.  No focal bony abnormality is identified.  IMPRESSION:  1.  Negative for metastatic or acute disease. 2.  Massive cardiomegaly.  CT ABDOMEN AND PELVIS  Findings:  The liver, adrenal glands, spleen, pancreas and kidneys all appear normal.  Multiple stones are seen within the gallbladder but there is no CT evidence of cholecystitis.  The patient has a left inguinal hernia containing a loop of descending colon.  No obstruction or other complicating feature is identified.  Scattered colonic diverticula without diverticulitis are noted.  Focal wall thickening is seen in the transverse colon just distal to the hepatic flexure which may represent the  patient's mass seen on colonoscopy. The walls of the rectosigmoid colon and cecum are thickened although these loops are under distended.  Stomach, small bowel and appendix are normal in appearance.  The prostate gland is mildly enlarged.  Urinary bladder appears normal.  There is no lymphadenopathy or fluid.  No focal bony abnormality is identified.  IMPRESSION:  1.  Negative for acute or metastatic disease. 2.  Wall thickening in the transverse colon just distal to the hepatic flexure may represent the patient's known colon mass.  Wall thickening of the cecum and rectosigmoid colon is likely due to under distention.  3.  Mild enlargement of the prostate gland. 4.  Diverticulosis without diverticulitis. 5.  Left inguinal hernia contains a knuckle of colon without obstruction or other complicating feature. 6.  Gallstones without cholecystitis.   Original Report Authenticated By: Holley Dexter, M.D.          Subjective:  No more bleeding since yesterday am, otherwise feels ok,  pt denies any chest pain, SoB, PO intake better,  Objective: Filed Vitals:   02/14/12 0000 02/14/12 0350 02/14/12 0800 02/14/12 1125  BP:  129/68 141/71   Pulse: 84 99 124   Temp:  98.2 F (36.8 C) 97.8 F (36.6 C) 98 F (36.7 C)  TempSrc:  Oral Oral Oral  Resp: 17 19 18    Height:      Weight:      SpO2: 98% 96% 99%     Intake/Output Summary (Last 24  hours) at 02/14/12 1140 Last data filed at 02/14/12 1125  Gross per 24 hour  Intake   2070 ml  Output    950 ml  Net   1120 ml   Weight change:  Exam:   General:  Pt is alert, follows commands appropriately, not in acute distress  HEENT: No icterus, No thrush, No neck mass, [place]/AT  Cardiovascular: RRR, S1/S2, metallic click, no rubs, no gallops  Respiratory: CTA bilaterally, no wheezing, no crackles, no rhonchi  Abdomen: Soft, surgical incision noted, mild  tenderness as expected, non distended, no guarding  Extremities: No edema, No lymphangitis,  No petechiae, No rashes, no synovitis  Data Reviewed: Basic Metabolic Panel:  Lab 02/14/12 1610 02/13/12 0630 02/12/12 0535 02/11/12 0437 02/08/12 0445  NA 141 140 138 139 142  K 4.3 4.1 4.3 4.2 4.5  CL 112 109 103 105 108  CO2 22 24 25 25 25   GLUCOSE 116* 116* 133* 121* 95  BUN 21 28* 17 13 23   CREATININE 0.92 1.18 1.41* 1.04 1.20  CALCIUM 8.7 8.6 8.9 9.1 9.3  MG -- -- -- -- --  PHOS -- -- -- -- --   Liver Function Tests: No results found for this basename: AST:5,ALT:5,ALKPHOS:5,BILITOT:5,PROT:5,ALBUMIN:5 in the last 168 hours No results found for this basename: LIPASE:5,AMYLASE:5 in the last 168 hours No results found for this basename: AMMONIA:5 in the last 168 hours CBC:  Lab 02/14/12 0420 02/13/12 2150 02/13/12 1320 02/13/12 0630 02/12/12 1825  WBC 9.2 9.7 9.7 9.4 8.6  NEUTROABS -- -- -- -- --  HGB 9.7* 9.6* 9.3* 9.1* 8.0*  HCT 29.1* 28.8* 27.7* 27.0* 24.1*  MCV 81.7 80.2 78.9 78.3 77.5*  PLT 139* 141* 147* 130* 159   Cardiac Enzymes: No results found for this basename: CKTOTAL:5,CKMB:5,CKMBINDEX:5,TROPONINI:5 in the last 168 hours BNP: No components found with this basename: POCBNP:5 CBG:  Lab 02/08/12 1112  GLUCAP 116*    Recent Results (from the past 240 hour(s))  SURGICAL PCR SCREEN     Status: Normal   Collection Time   02/10/12  5:14 AM      Component Value Range Status Comment   MRSA, PCR NEGATIVE  NEGATIVE Final    Staphylococcus aureus NEGATIVE  NEGATIVE Final      Scheduled Meds:    . alvimopan  12 mg Oral BID  . diphenhydrAMINE  6.25-12.5 mg Oral QHS,MR X 1  . furosemide  40 mg Intravenous Once  . metoprolol tartrate  50 mg Oral BID  . pantoprazole  40 mg Oral QAC lunch  . simvastatin  10 mg Oral q1800  . sodium chloride  3 mL Intravenous Q12H  . [EXPIRED] sodium chloride      . [DISCONTINUED] diphenhydrAMINE  25 mg Oral QHS,MR X 1  . [DISCONTINUED] metoprolol tartrate  25 mg Oral BID   Continuous Infusions:    . [DISCONTINUED] sodium  chloride 0.9 % 1,000 mL with potassium chloride 20 mEq infusion Stopped (02/14/12 0200)     Zannie Cove, MD  [facility] Pager [phone]  If 7PM-7AM, please contact night-coverage [email or web address] Password [email or web address] 02/14/2012, 11:40 AM   LOS: 11 days

## 2012-02-14 NOTE — Progress Notes (Signed)
Patient ID: Alfred Dennis, male   DOB: 11/21/1923, 78 y.o.   MRN: [id number]   SUBJECTIVE: The patient says that he is feeling a little better today. He's not having any chest pain or shortness of breath. I have reviewed the I and O. Flow sheet. I do not understand why the total overall continues to get negative while the daily I&O is positive. I do believe that the patient has enough volume on board at this time. He has left ventricular dysfunction. He has been on a diuretic at home in the past.   Filed Vitals:   02/13/12 2308 02/14/12 0000 02/14/12 0350 02/14/12 0800  BP: 123/55  129/68 141/71  Pulse: 87 84 99 124  Temp: 98.2 F (36.8 C)  98.2 F (36.8 C) 97.8 F (36.6 C)  TempSrc: Oral  Oral Oral  Resp: 19 17 19 18   Height:      Weight:      SpO2: 98% 98% 96% 99%    Intake/Output Summary (Last 24 hours) at 02/14/12 1114 Last data filed at 02/14/12 0900  Gross per 24 hour  Intake   2070 ml  Output    850 ml  Net   1220 ml    LABS: Basic Metabolic Panel:  Basename 02/14/12 0420 02/13/12 0630  NA 141 140  K 4.3 4.1  CL 112 109  CO2 22 24  GLUCOSE 116* 116*  BUN 21 28*  CREATININE 0.92 1.18  CALCIUM 8.7 8.6  MG -- --  PHOS -- --   Liver Function Tests: No results found for this basename: AST:2,ALT:2,ALKPHOS:2,BILITOT:2,PROT:2,ALBUMIN:2 in the last 72 hours No results found for this basename: LIPASE:2,AMYLASE:2 in the last 72 hours CBC:  Basename 02/14/12 0420 02/13/12 2150  WBC 9.2 9.7  NEUTROABS -- --  HGB 9.7* 9.6*  HCT 29.1* 28.8*  MCV 81.7 80.2  PLT 139* 141*   Cardiac Enzymes: No results found for this basename: CKTOTAL:3,CKMB:3,CKMBINDEX:3,TROPONINI:3 in the last 72 hours BNP: No components found with this basename: POCBNP:3 D-Dimer: No results found for this basename: DDIMER:2 in the last 72 hours Hemoglobin A1C: No results found for this basename: HGBA1C in the last 72 hours Fasting Lipid Panel: No results found for this basename:  CHOL,HDL,LDLCALC,TRIG,CHOLHDL,LDLDIRECT in the last 72 hours Thyroid Function Tests: No results found for this basename: TSH,T4TOTAL,FREET3,T3FREE,THYROIDAB in the last 72 hours  RADIOLOGY: Dg Chest 2 View  02/03/2012  *RADIOLOGY REPORT*  Clinical Data: Short of breath.  Weakness.  CHEST - 2 VIEW  Comparison: None.  Findings: Cardiomegaly.  Pulmonary vascular congestion.  Elevation of the left hemidiaphragm.  Left basilar atelectasis.  No focal consolidation.  Mitral valve replacement.  Interstitial pulmonary edema is present.  IMPRESSION: Cardiomegaly, pulmonary vascular congestion and interstitial pulmonary edema compatible with mild CHF.   Original Report Authenticated By: Andreas Newport, M.D.    Ct Chest W Contrast  02/09/2012  *RADIOLOGY REPORT*  Clinical Data:  Status post colonoscopy 02/08/2012 where a circumferential ulcerated mass was identified transverse colon.  CT CHEST, ABDOMEN AND PELVIS WITH CONTRAST  Technique:  Multidetector CT imaging of the chest, abdomen and pelvis was performed following the standard protocol during bolus administration of intravenous contrast.  Contrast: OMNIPAQUE IOHEXOL 300 MG/ML  SOLN  Comparison:  Plain films of the chest 02/03/2012.  CT CHEST  Findings:  There is no axillary, hilar or mediastinal lymphadenopathy.  Trace pleural fluid on the left is noted.  There is no right pleural effusion or pericardial effusion.  Massive cardiomegaly  is identified.  The patient is status post CABG and mitral valve replacement.  No pulmonary nodule or mass is identified.  There is some mild dependent atelectasis.  No focal bony abnormality is identified.  IMPRESSION:  1.  Negative for metastatic or acute disease. 2.  Massive cardiomegaly.  CT ABDOMEN AND PELVIS  Findings:  The liver, adrenal glands, spleen, pancreas and kidneys all appear normal.  Multiple stones are seen within the gallbladder but there is no CT evidence of cholecystitis.  The patient has a left inguinal  hernia containing a loop of descending colon.  No obstruction or other complicating feature is identified.  Scattered colonic diverticula without diverticulitis are noted.  Focal wall thickening is seen in the transverse colon just distal to the hepatic flexure which may represent the patient's mass seen on colonoscopy. The walls of the rectosigmoid colon and cecum are thickened although these loops are under distended.  Stomach, small bowel and appendix are normal in appearance.  The prostate gland is mildly enlarged.  Urinary bladder appears normal.  There is no lymphadenopathy or fluid.  No focal bony abnormality is identified.  IMPRESSION:  1.  Negative for acute or metastatic disease. 2.  Wall thickening in the transverse colon just distal to the hepatic flexure may represent the patient's known colon mass.  Wall thickening of the cecum and rectosigmoid colon is likely due to under distention.  3.  Mild enlargement of the prostate gland. 4.  Diverticulosis without diverticulitis. 5.  Left inguinal hernia contains a knuckle of colon without obstruction or other complicating feature. 6.  Gallstones without cholecystitis.   Original Report Authenticated By: Holley Dexter, M.D.    Ct Abdomen Pelvis W Contrast  02/09/2012  *RADIOLOGY REPORT*  Clinical Data:  Status post colonoscopy 02/08/2012 where a circumferential ulcerated mass was identified transverse colon.  CT CHEST, ABDOMEN AND PELVIS WITH CONTRAST  Technique:  Multidetector CT imaging of the chest, abdomen and pelvis was performed following the standard protocol during bolus administration of intravenous contrast.  Contrast: OMNIPAQUE IOHEXOL 300 MG/ML  SOLN  Comparison:  Plain films of the chest 02/03/2012.  CT CHEST  Findings:  There is no axillary, hilar or mediastinal lymphadenopathy.  Trace pleural fluid on the left is noted.  There is no right pleural effusion or pericardial effusion.  Massive cardiomegaly is identified.  The patient is  status post CABG and mitral valve replacement.  No pulmonary nodule or mass is identified.  There is some mild dependent atelectasis.  No focal bony abnormality is identified.  IMPRESSION:  1.  Negative for metastatic or acute disease. 2.  Massive cardiomegaly.  CT ABDOMEN AND PELVIS  Findings:  The liver, adrenal glands, spleen, pancreas and kidneys all appear normal.  Multiple stones are seen within the gallbladder but there is no CT evidence of cholecystitis.  The patient has a left inguinal hernia containing a loop of descending colon.  No obstruction or other complicating feature is identified.  Scattered colonic diverticula without diverticulitis are noted.  Focal wall thickening is seen in the transverse colon just distal to the hepatic flexure which may represent the patient's mass seen on colonoscopy. The walls of the rectosigmoid colon and cecum are thickened although these loops are under distended.  Stomach, small bowel and appendix are normal in appearance.  The prostate gland is mildly enlarged.  Urinary bladder appears normal.  There is no lymphadenopathy or fluid.  No focal bony abnormality is identified.  IMPRESSION:  1.  Negative for acute or metastatic disease. 2.  Wall thickening in the transverse colon just distal to the hepatic flexure may represent the patient's known colon mass.  Wall thickening of the cecum and rectosigmoid colon is likely due to under distention.  3.  Mild enlargement of the prostate gland. 4.  Diverticulosis without diverticulitis. 5.  Left inguinal hernia contains a knuckle of colon without obstruction or other complicating feature. 6.  Gallstones without cholecystitis.   Original Report Authenticated By: Holley Dexter, M.D.    Dg Chest Port 1 View  02/12/2012  *RADIOLOGY REPORT*  Clinical Data: PICC placement.  PORTABLE CHEST - 1 VIEW  Comparison: 02/03/2012.  Findings: Interval right PICC with its tip in the upper right atrium.  Stable enlargement of the cardiac  silhouette and elevation of the left hemidiaphragm.  Stable post CABG changes and prosthetic heart valve.  Minimal linear scarring at the right lung base is unchanged.  IMPRESSION:  1.  Right PICC tip in the upper right atrium.  If a position at the cavoatrial junction is desired, this could be retracted 1.5 cm. 2.  Stable cardiomegaly, elevated left hemidiaphragm and minimal right basilar scarring.   Original Report Authenticated By: Beckie Salts, M.D.     PHYSICAL EXAM   Patient is oriented to person time and place. Affect is normal. He is comfortable in bed. Lung exam reveals a few scattered rales. Cardiac exam reveals S1 and S2. The abdomen is soft. There is 1+ peripheral edema.   TELEMETRY:  I have reviewed telemetry today February 14, 2012. There is atrial fibrillation.   ASSESSMENT AND PLAN:    *CHF (congestive heart failure)    There is a clinical history of congestive heart failure. I believe that his volume status is becoming increased. As I mentioned in the First sentences of this note above, I'm not sure that the overall input and output assessment is correct.   The surgical team today wrote to continue IV fluid. I've decided not to change this. However I'm going to give the patient some IV Lasix suspecting that he will have significant urine output. We can then make final adjustments to fluids and diuretics tomorrow   Chronic a-fib    Beta blocker was restarted yesterday. There is better rate control. We will follow for further adjustments over time.   S/P MVR (mitral valve replacement)    Careful attention is being given to anticoagulate the patient the best possible keeping his mind his current GI bleed.    Non-sustained ventricular tachycardia    I do not see any evidence of any recurrent ventricular tachycardia.     Willa Rough 02/14/2012 11:14 AM

## 2012-02-14 NOTE — Progress Notes (Signed)
RN called to pt room by NT, observed sm dark red stool while doing peri-care, NP Duanne called, CBC to be checked at 1700 and to call NP if decrease in Hg, no further orders received, nursing will cont to monitor

## 2012-02-14 NOTE — Progress Notes (Signed)
I have seen and examined the patient and agree with the assessment and plans. Will advance po to regular diet.  No further signs of bleeding.  Stina Gane A. Magnus Ivan  MD, FACS

## 2012-02-14 NOTE — Progress Notes (Signed)
Patient ID: Alfred Dennis, male   DOB: 1923/05/28, 78 y.o.   MRN: [id number] 4 Days Post-Op  Subjective: Appears comfortable, eating breakfast, wife and daughter at bedside. "Wants to go home" Family reports patient not sleeping well. Will ask nursing to bundle activites, use po pain meds in attempt to make him more comfortable.  Objective: Vital signs in last 24 hours: Temp:  [97.8 F (36.6 C)-98.4 F (36.9 C)] 97.8 F (36.6 C) (12/08 0800) Pulse Rate:  [67-143] 99  (12/08 0350) Resp:  [16-19] 19  (12/08 0350) BP: (110-135)/(54-80) 129/68 mmHg (12/08 0350) SpO2:  [96 %-100 %] 96 % (12/08 0350) Last BM Date: 02/12/12  Intake/Output from previous day: 12/07 0701 - 12/08 0700 In: 2590 [P.O.:1480; I.V.:1110] Out: 950 [Urine:950] Intake/Output this shift:    General appearance: alert, cooperative, appears stated age and no distress Chest: CTA bilaterally Cardiac: RRR Abdomen: surgical incison site with minimal erythema, stales intact, no drainage; abdomen is slightly distended, tympanic. + BS, flatus, BM No recent bloody stools. Extremities: warm to touch, no edema or tenderness, + Pulses. Labs: WBC has trended down slightly, H& H is improved, PLT down slightly, BUN, Creatine improved.. INR currently 1.69 up from  from 1.62 on 12/7.  Lab Results:   [facility] 02/14/12 0420 02/13/12 2150  WBC 9.2 9.7  HGB 9.7* 9.6*  HCT 29.1* 28.8*  PLT 139* 141*   BMET  Basename 02/14/12 0420 02/13/12 0630  NA 141 140  K 4.3 4.1  CL 112 109  CO2 22 24  GLUCOSE 116* 116*  BUN 21 28*  CREATININE 0.92 1.18  CALCIUM 8.7 8.6   PT/INR  Basename 02/14/12 0420 02/13/12 0630  LABPROT 19.3* 18.7*  INR 1.69* 1.62*   ABG No results found for this basename: PHART:2,PCO2:2,PO2:2,HCO3:2 in the last 72 hours  Studies/Results: Dg Chest Port 1 View  02/12/2012  *RADIOLOGY REPORT*  Clinical Data: PICC placement.  PORTABLE CHEST - 1 VIEW  Comparison: 02/03/2012.  Findings: Interval right PICC  with its tip in the upper right atrium.  Stable enlargement of the cardiac silhouette and elevation of the left hemidiaphragm.  Stable post CABG changes and prosthetic heart valve.  Minimal linear scarring at the right lung base is unchanged.  IMPRESSION:  1.  Right PICC tip in the upper right atrium.  If a position at the cavoatrial junction is desired, this could be retracted 1.5 cm. 2.  Stable cardiomegaly, elevated left hemidiaphragm and minimal right basilar scarring.   Original Report Authenticated By: Beckie Salts, M.D.     Anti-infectives: Anti-infectives     Start     Dose/Rate Route Frequency Ordered Stop   02/10/12 1130   metroNIDAZOLE (FLAGYL) IVPB 500 mg  Status:  Discontinued        500 mg 100 mL/hr over 60 Minutes Intravenous To Surgery 02/10/12 1129 02/10/12 1604          Assessment/Plan:    Patient Active Problem List  Diagnosis  . Chronic a-fib  . CHF (congestive heart failure)  . ARF (acute renal failure)  . HTN (hypertension)  . S/P MVR (mitral valve replacement)  . GIB (gastrointestinal bleeding)  . Iron deficiency anemia  . Acute on chronic systolic CHF (congestive heart failure)  . Non-sustained ventricular tachycardia  . Carcinoma of colon  . Benign neoplasm of colon  . Gastritis  . Colonic mass  . Hypotension   s/p Procedure(s) (LRB) with comments: LAPAROSCOPIC PARTIAL COLECTOMY (N/A)  Plan: 1. Continue to monitor H&H  q8hrs 2. Continue to advance diet as tolerated 3. OOB/Ambulate 4. Management of his other needs per medicine 5. Continue IVF as UOP remains low   LOS: 11 days    [facility] Surgery Pager [phone]  02/14/2012

## 2012-02-14 NOTE — Progress Notes (Addendum)
ANTICOAGULATION CONSULT NOTE - Follow Up Consult  Pharmacy Consult for heparin Indication: atrial fibrillation and MVR  Labs:  Basename 02/14/12 2121 02/14/12 1645 02/14/12 0420 02/13/12 2150 02/13/12 0630 02/12/12 1825 02/12/12 0535  HGB -- 9.7* 9.7* -- -- -- --  HCT -- 29.6* 29.1* 28.8* -- -- --  PLT -- 168 139* 141* -- -- --  APTT -- -- -- -- -- 33 --  LABPROT -- -- 19.3* -- 18.7* 19.6* --  INR -- -- 1.69* -- 1.62* 1.72* --  HEPARINUNFRC <0.10* -- -- -- -- -- 0.13*  CREATININE -- -- 0.92 -- 1.18 -- 1.41*  CKTOTAL -- -- -- -- -- -- --  CKMB -- -- -- -- -- -- --  TROPONINI -- -- -- -- -- -- --    Assessment: 78yo male undetectable on heparin with initial dosing for Afib/MVR; cautious dosing due to recent bleeding and anemia.  Goal of Therapy:  Heparin level 0.3-0.5 units/ml   Plan:  Will increase heparin gtt by 3 units/kg/hr to 1100 units/hr and check level in 8hr.  Colleen Can PharmD BCPS 02/14/2012,11:23 PM

## 2012-02-15 LAB — CBC
HCT: 27.9 % — ABNORMAL LOW (ref 39.0–52.0)
Hemoglobin: 9.1 g/dL — ABNORMAL LOW (ref 13.0–17.0)
Hemoglobin: 9.5 g/dL — ABNORMAL LOW (ref 13.0–17.0)
MCHC: 32.6 g/dL (ref 30.0–36.0)
MCHC: 32.5 g/dL (ref 30.0–36.0)
MCV: 81.8 fL (ref 78.0–100.0)
Platelets: 173 10*3/uL (ref 150–400)
RDW: 21.1 % — ABNORMAL HIGH (ref 11.5–15.5)
RDW: 21.5 % — ABNORMAL HIGH (ref 11.5–15.5)

## 2012-02-15 LAB — BASIC METABOLIC PANEL
BUN: 24 mg/dL — ABNORMAL HIGH (ref 6–23)
CO2: 25 mEq/L (ref 19–32)
Chloride: 107 mEq/L (ref 96–112)
Creatinine, Ser: 0.96 mg/dL (ref 0.50–1.35)
GFR calc Af Amer: 85 mL/min — ABNORMAL LOW (ref >90)
Potassium: 3.7 mEq/L (ref 3.5–5.1)

## 2012-02-15 LAB — HEPARIN LEVEL (UNFRACTIONATED): Heparin Unfractionated: 0.29 IU/mL — ABNORMAL LOW (ref 0.30–0.70)

## 2012-02-15 LAB — PROTIME-INR: INR: 1.66 — ABNORMAL HIGH (ref 0.00–1.49)

## 2012-02-15 MED ORDER — SODIUM CHLORIDE 0.9 % IJ SOLN
10.0000 mL | Freq: Two times a day (BID) | INTRAMUSCULAR | Status: DC
  Administered 2012-02-15 – 2012-02-17 (×4): 10 mL via INTRAVENOUS
  Filled 2012-02-15: qty 20

## 2012-02-15 MED ORDER — WARFARIN SODIUM 6 MG PO TABS
6.0000 mg | ORAL_TABLET | Freq: Once | ORAL | Status: AC
  Administered 2012-02-15: 6 mg via ORAL
  Filled 2012-02-15 (×2): qty 1

## 2012-02-15 MED ORDER — FLUTICASONE PROPIONATE 50 MCG/ACT NA SUSP
2.0000 | Freq: Every day | NASAL | Status: DC
  Administered 2012-02-15 – 2012-02-18 (×4): 2 via NASAL
  Filled 2012-02-15: qty 16

## 2012-02-15 MED ORDER — SODIUM CHLORIDE 0.9 % IJ SOLN
INTRAMUSCULAR | Status: AC
  Administered 2012-02-15: 10 mL via INTRAVENOUS
  Filled 2012-02-15: qty 10

## 2012-02-15 MED ORDER — FUROSEMIDE 40 MG PO TABS
40.0000 mg | ORAL_TABLET | Freq: Every day | ORAL | Status: DC
  Administered 2012-02-15 – 2012-02-18 (×4): 40 mg via ORAL
  Filled 2012-02-15 (×4): qty 1

## 2012-02-15 NOTE — Evaluation (Signed)
Physical Therapy Evaluation Patient Details Name: Alfred Dennis MRN: [id number] DOB: [birth year] Today's Date: 02/15/2012 Time: 0820-0850 PT Time Calculation (min): 30 min  PT Assessment / Plan / Recommendation Clinical Impression  Pt is 78 y/o male admitted for SOB with increase LE swelling due to CHF.  Pt will benefit from acute PT services to improve overall mobility and decrease endurance.  Recommend HHPT with 24 hour supervision at this time.  Plan to use RW next session and educate on energy conservation.    PT Assessment  Patient needs continued PT services    Follow Up Recommendations  Home health PT;Supervision/Assistance - 24 hour    Does the patient have the potential to tolerate intense rehabilitation      Barriers to Discharge None      Equipment Recommendations  Rolling walker with 5" wheels (daughter to bring in RW to see if correct adjustment)    Recommendations for Other Services     Frequency Min 3X/week    Precautions / Restrictions Precautions Precautions: Fall Restrictions Weight Bearing Restrictions: No   Pertinent Vitals/Pain No c/o pain      Mobility  Bed Mobility Bed Mobility: Supine to Sit Supine to Sit: 4: Min assist;HOB elevated;With rails Details for Bed Mobility Assistance: (A) with trunk OOB with cues for technique Transfers Transfers: Sit to Stand;Stand to Sit Sit to Stand: 4: Min assist;From bed Stand to Sit: 4: Min assist;To chair/3-in-1 Ambulation/Gait Ambulation/Gait Assistance: 1: +2 Total assist Ambulation/Gait: Patient Percentage: 70% Ambulation Distance (Feet): 10 Feet Assistive device: 2 person hand held assist Ambulation/Gait Assistance Details: +2 (A) for safety and lines with HHA x 2.   Gait Pattern: Step-through pattern;Decreased stride length;Shuffle;Trunk flexed;Narrow base of support Stairs: No    Shoulder Instructions     Exercises     PT Diagnosis: Difficulty walking;Generalized weakness  PT Problem List:  Decreased strength;Decreased activity tolerance;Decreased balance;Decreased mobility;Decreased knowledge of use of DME PT Treatment Interventions: DME instruction;Gait training;Stair training;Functional mobility training;Therapeutic activities;Therapeutic exercise;Balance training   PT Goals Acute Rehab PT Goals PT Goal Formulation: With patient/family Time For Goal Achievement: 02/22/12 Potential to Achieve Goals: Good Pt will go Supine/Side to Sit: with modified independence PT Goal: Supine/Side to Sit - Progress: Goal set today Pt will go Sit to Supine/Side: with modified independence PT Goal: Sit to Supine/Side - Progress: Goal set today Pt will go Sit to Stand: with modified independence PT Goal: Sit to Stand - Progress: Goal set today Pt will go Stand to Sit: with modified independence PT Goal: Stand to Sit - Progress: Goal set today Pt will Ambulate: >150 feet;with modified independence;with rolling walker PT Goal: Ambulate - Progress: Goal set today Pt will Go Up / Down Stairs: 1-2 stairs;with min assist;with least restrictive assistive device PT Goal: Up/Down Stairs - Progress: Goal set today  Visit Information  Last PT Received On: 02/15/12 Assistance Needed: +1    Subjective Data  Subjective: "I'm doing ok." Patient Stated Goal: To go home with family   Prior Functioning  Home Living Lives With: Spouse (Daughter) Available Help at Discharge: Family (wife has early dementia however daughter to stay night) Type of Home: House Home Access: Stairs to enter Secretary/administrator of Steps: 1 Entrance Stairs-Rails: None Home Layout: One level Bathroom Shower/Tub: Tub/shower unit;Door [employer] Toilet: Standard Bathroom Accessibility: Yes How Accessible: Accessible via walker Home Adaptive Equipment: Grab bars in shower;Walker - rolling;Shower chair with back;Bedside commode/3-in-1 Prior Function Level of Independence: Independent Able to Take Stairs?: Yes Driving:  Yes Vocation: Retired Musician: No difficulties Dominant Hand: Left    Cognition  Overall Cognitive Status: Appears within functional limits for tasks assessed/performed Arousal/Alertness: Awake/alert Orientation Level: Appears intact for tasks assessed Behavior During Session: [facility] for tasks performed    Extremity/Trunk Assessment Right Lower Extremity Assessment RLE ROM/Strength/Tone: Deficits RLE ROM/Strength/Tone Deficits: At least 4/5 gross; Limited hamstring length Left Lower Extremity Assessment LLE ROM/Strength/Tone: Deficits LLE ROM/Strength/Tone Deficits: At least 4/5 gross;  decrease mm length in hamstring Trunk Assessment Trunk Assessment: Kyphotic   Balance    End of Session PT - End of Session Equipment Utilized During Treatment: Gait belt Activity Tolerance: Patient limited by fatigue Patient left: in chair;with call bell/phone within reach Nurse Communication: Mobility status  GP     Raynelle Fujikawa 02/15/2012, 9:47 AM Jake Shark, PT DPT [phone]

## 2012-02-15 NOTE — Progress Notes (Signed)
SUBJECTIVE:  Up in chair eating breakfast with no complaints  OBJECTIVE:   Vitals:   Filed Vitals:   02/14/12 2013 02/14/12 2332 02/15/12 0337 02/15/12 0800  BP: 109/51 119/45 112/61 121/69  Pulse: 90 93 79 81  Temp: 98.1 F (36.7 C) 97.8 F (36.6 C) 97.9 F (36.6 C) 97.7 F (36.5 C)  TempSrc: Oral Oral Oral Oral  Resp: 19 17 18 19   Height:      Weight:   69 kg (152 lb 1.9 oz)   SpO2: 99% 97% 99% 97%   I&O's:   Intake/Output Summary (Last 24 hours) at 02/15/12 0912 Last data filed at 02/15/12 0800  Gross per 24 hour  Intake  861.1 ml  Output    525 ml  Net  336.1 ml   TELEMETRY: Reviewed telemetry pt in atrial fibrillation with CVR     PHYSICAL EXAM General: Well developed, well nourished, in no acute distress Head: Eyes PERRLA, No xanthomas.   Normal cephalic and atramatic  Lungs:   Irregularly irregular Heart:   HRRR S1 S2 Pulses are 2+ & equal. Abdomen: Bowel sounds are positive, abdomen soft and non-tender without masses  Extremities:   No clubbing, cyanosis or edema.  DP +1 Neuro: Alert and oriented X 3. Psych:  Good affect, responds appropriately   LABS: Basic Metabolic Panel:  Basename 02/15/12 0409 02/14/12 0420  NA 139 141  K 3.7 4.3  CL 107 112  CO2 25 22  GLUCOSE 100* 116*  BUN 24* 21  CREATININE 0.96 0.92  CALCIUM 8.5 8.7  MG -- --  PHOS -- --   Liver Function Tests: No results found for this basename: AST:2,ALT:2,ALKPHOS:2,BILITOT:2,PROT:2,ALBUMIN:2 in the last 72 hours No results found for this basename: LIPASE:2,AMYLASE:2 in the last 72 hours CBC:  Basename 02/15/12 0409 02/14/12 1645  WBC 7.4 9.4  NEUTROABS -- --  HGB 9.1* 9.7*  HCT 27.9* 29.6*  MCV 81.8 81.5  PLT 146* 168   Cardiac Enzymes: No results found for this basename: CKTOTAL:3,CKMB:3,CKMBINDEX:3,TROPONINI:3 in the last 72 hours BNP: No components found with this basename: POCBNP:3 D-Dimer: No results found for this basename: DDIMER:2 in the last 72 hours Hemoglobin  A1C: No results found for this basename: HGBA1C in the last 72 hours Fasting Lipid Panel: No results found for this basename: CHOL,HDL,LDLCALC,TRIG,CHOLHDL,LDLDIRECT in the last 72 hours Thyroid Function Tests: No results found for this basename: TSH,T4TOTAL,FREET3,T3FREE,THYROIDAB in the last 72 hours Anemia Panel: No results found for this basename: VITAMINB12,FOLATE,FERRITIN,TIBC,IRON,RETICCTPCT in the last 72 hours Coag Panel:   Lab Results  Component Value Date   INR 1.69* 02/14/2012   INR 1.62* 02/13/2012   INR 1.72* 02/12/2012    RADIOLOGY: Dg Chest 2 View  02/03/2012  *RADIOLOGY REPORT*  Clinical Data: Short of breath.  Weakness.  CHEST - 2 VIEW  Comparison: None.  Findings: Cardiomegaly.  Pulmonary vascular congestion.  Elevation of the left hemidiaphragm.  Left basilar atelectasis.  No focal consolidation.  Mitral valve replacement.  Interstitial pulmonary edema is present.  IMPRESSION: Cardiomegaly, pulmonary vascular congestion and interstitial pulmonary edema compatible with mild CHF.   Original Report Authenticated By: Andreas Newport, M.D.    Ct Chest W Contrast  02/09/2012  *RADIOLOGY REPORT*  Clinical Data:  Status post colonoscopy 02/08/2012 where a circumferential ulcerated mass was identified transverse colon.  CT CHEST, ABDOMEN AND PELVIS WITH CONTRAST  Technique:  Multidetector CT imaging of the chest, abdomen and pelvis was performed following the standard protocol during bolus administration of intravenous  contrast.  Contrast: OMNIPAQUE IOHEXOL 300 MG/ML  SOLN  Comparison:  Plain films of the chest 02/03/2012.  CT CHEST  Findings:  There is no axillary, hilar or mediastinal lymphadenopathy.  Trace pleural fluid on the left is noted.  There is no right pleural effusion or pericardial effusion.  Massive cardiomegaly is identified.  The patient is status post CABG and mitral valve replacement.  No pulmonary nodule or mass is identified.  There is some mild dependent  atelectasis.  No focal bony abnormality is identified.  IMPRESSION:  1.  Negative for metastatic or acute disease. 2.  Massive cardiomegaly.  CT ABDOMEN AND PELVIS  Findings:  The liver, adrenal glands, spleen, pancreas and kidneys all appear normal.  Multiple stones are seen within the gallbladder but there is no CT evidence of cholecystitis.  The patient has a left inguinal hernia containing a loop of descending colon.  No obstruction or other complicating feature is identified.  Scattered colonic diverticula without diverticulitis are noted.  Focal wall thickening is seen in the transverse colon just distal to the hepatic flexure which may represent the patient's mass seen on colonoscopy. The walls of the rectosigmoid colon and cecum are thickened although these loops are under distended.  Stomach, small bowel and appendix are normal in appearance.  The prostate gland is mildly enlarged.  Urinary bladder appears normal.  There is no lymphadenopathy or fluid.  No focal bony abnormality is identified.  IMPRESSION:  1.  Negative for acute or metastatic disease. 2.  Wall thickening in the transverse colon just distal to the hepatic flexure may represent the patient's known colon mass.  Wall thickening of the cecum and rectosigmoid colon is likely due to under distention.  3.  Mild enlargement of the prostate gland. 4.  Diverticulosis without diverticulitis. 5.  Left inguinal hernia contains a knuckle of colon without obstruction or other complicating feature. 6.  Gallstones without cholecystitis.   Original Report Authenticated By: Holley Dexter, M.D.    Ct Abdomen Pelvis W Contrast  02/09/2012  *RADIOLOGY REPORT*  Clinical Data:  Status post colonoscopy 02/08/2012 where a circumferential ulcerated mass was identified transverse colon.  CT CHEST, ABDOMEN AND PELVIS WITH CONTRAST  Technique:  Multidetector CT imaging of the chest, abdomen and pelvis was performed following the standard protocol during bolus  administration of intravenous contrast.  Contrast: OMNIPAQUE IOHEXOL 300 MG/ML  SOLN  Comparison:  Plain films of the chest 02/03/2012.  CT CHEST  Findings:  There is no axillary, hilar or mediastinal lymphadenopathy.  Trace pleural fluid on the left is noted.  There is no right pleural effusion or pericardial effusion.  Massive cardiomegaly is identified.  The patient is status post CABG and mitral valve replacement.  No pulmonary nodule or mass is identified.  There is some mild dependent atelectasis.  No focal bony abnormality is identified.  IMPRESSION:  1.  Negative for metastatic or acute disease. 2.  Massive cardiomegaly.  CT ABDOMEN AND PELVIS  Findings:  The liver, adrenal glands, spleen, pancreas and kidneys all appear normal.  Multiple stones are seen within the gallbladder but there is no CT evidence of cholecystitis.  The patient has a left inguinal hernia containing a loop of descending colon.  No obstruction or other complicating feature is identified.  Scattered colonic diverticula without diverticulitis are noted.  Focal wall thickening is seen in the transverse colon just distal to the hepatic flexure which may represent the patient's mass seen on colonoscopy. The walls  of the rectosigmoid colon and cecum are thickened although these loops are under distended.  Stomach, small bowel and appendix are normal in appearance.  The prostate gland is mildly enlarged.  Urinary bladder appears normal.  There is no lymphadenopathy or fluid.  No focal bony abnormality is identified.  IMPRESSION:  1.  Negative for acute or metastatic disease. 2.  Wall thickening in the transverse colon just distal to the hepatic flexure may represent the patient's known colon mass.  Wall thickening of the cecum and rectosigmoid colon is likely due to under distention.  3.  Mild enlargement of the prostate gland. 4.  Diverticulosis without diverticulitis. 5.  Left inguinal hernia contains a knuckle of colon without  obstruction or other complicating feature. 6.  Gallstones without cholecystitis.   Original Report Authenticated By: Holley Dexter, M.D.    Dg Chest Port 1 View  02/12/2012  *RADIOLOGY REPORT*  Clinical Data: PICC placement.  PORTABLE CHEST - 1 VIEW  Comparison: 02/03/2012.  Findings: Interval right PICC with its tip in the upper right atrium.  Stable enlargement of the cardiac silhouette and elevation of the left hemidiaphragm.  Stable post CABG changes and prosthetic heart valve.  Minimal linear scarring at the right lung base is unchanged.  IMPRESSION:  1.  Right PICC tip in the upper right atrium.  If a position at the cavoatrial junction is desired, this could be retracted 1.5 cm. 2.  Stable cardiomegaly, elevated left hemidiaphragm and minimal right basilar scarring.   Original Report Authenticated By: Beckie Salts, M.D.     ASSESSMENT AND PLAN:  Patient Active Hospital Problem List: CHF (congestive heart failure) (02/03/2012)  Chronic a-fib (02/03/2012)  S/P MVR (mitral valve replacement) (02/03/2012)  GIB (gastrointestinal bleeding) (02/03/2012)  Non-sustained ventricular tachycardia (02/06/2012)  Pt relatively stable  Betablocker resumed for rate control cardiomyoapty and VTNS  Anticoagulation is coming, On coumadin and cautious use of IV heparin 2/2 - INR pending this am Restart home PO Lasix dose      Quintella Reichert, MD 02/15/2012  9:12 AM

## 2012-02-15 NOTE — Progress Notes (Signed)
Patient ID: Alfred Dennis, male   DOB: [birth year], 78 y.o.   MRN: [id number] 5 Days Post-Op  Subjective: Appears comfortable, eating breakfast, wife and daughter at bedside. Patient slept better last evening. Heparin drip restarted yesterday.  Objective: Vital signs in last 24 hours: Temp:  [97.7 F (36.5 C)-98.1 F (36.7 C)] 97.7 F (36.5 C) (12/09 0750) Pulse Rate:  [79-97] 79  (12/09 0337) Resp:  [17-19] 18  (12/09 0337) BP: (109-126)/(45-61) 112/61 mmHg (12/09 0337) SpO2:  [96 %-99 %] 99 % (12/09 0337) Weight:  [152 lb 1.9 oz (69 kg)] 152 lb 1.9 oz (69 kg) (12/09 0337) Last BM Date: 02/12/12  Intake/Output from previous day: 12/08 0701 - 12/09 0700 In: 1078.3 [P.O.:960; I.V.:118.3] Out: 425 [Urine:425] Intake/Output this shift: Total I/O In: -  Out: 100 [Urine:100]  General appearance: alert, cooperative, appears stated age and no distress Chest: CTA bilaterally Cardiac: RRR Abdomen: surgical incison site with minimal erythema, stales intact, no drainage; abdomen is slightly tender+ BS, flatus, BM No recent bloody stools. (1 small ? Bloody BM reported by RN yesterday evening but none since) Extremities: warm to touch, no edema or tenderness, + Pulses. Labs: WBC has trended down  H& H is down slightly PLT down slightly, BUN, Creatine have increased some with decrease in IVF.  INR: pending  Lab Results:   Basename 02/15/12 0409 02/14/12 1645  WBC 7.4 9.4  HGB 9.1* 9.7*  HCT 27.9* 29.6*  PLT 146* 168   BMET  Basename 02/15/12 0409 02/14/12 0420  NA 139 141  K 3.7 4.3  CL 107 112  CO2 25 22  GLUCOSE 100* 116*  BUN 24* 21  CREATININE 0.96 0.92  CALCIUM 8.5 8.7   PT/INR  Basename 02/14/12 0420 02/13/12 0630  LABPROT 19.3* 18.7*  INR 1.69* 1.62*   ABG No results found for this basename: PHART:2,PCO2:2,PO2:2,HCO3:2 in the last 72 hours  Studies/Results: No results found.  Anti-infectives: Anti-infectives     Start     Dose/Rate Route Frequency  Ordered Stop   02/10/12 1130   metroNIDAZOLE (FLAGYL) IVPB 500 mg  Status:  Discontinued        500 mg 100 mL/hr over 60 Minutes Intravenous To Surgery 02/10/12 1129 02/10/12 1604          Assessment/Plan:    Patient Active Problem List  Diagnosis  . Chronic a-fib  . CHF (congestive heart failure)  . ARF (acute renal failure)  . HTN (hypertension)  . S/P MVR (mitral valve replacement)  . GIB (gastrointestinal bleeding)  . Iron deficiency anemia  . Acute on chronic systolic CHF (congestive heart failure)  . Non-sustained ventricular tachycardia  . Carcinoma of colon  . Benign neoplasm of colon  . Gastritis  . Colonic mass  . Hypotension   s/p Procedure(s) (LRB) with comments: LAPAROSCOPIC PARTIAL COLECTOMY (N/A)  Plan: 1. Continue to monitor H&H, INR  2. Continue to advance diet as tolerated 3. OOB/Ambulate 4. Management of his other needs per medicine 5. Stable for transfer to med surg floor from our standpoint.   LOS: 12 days    Blenda [facility] Surgery Pager [phone]  02/15/2012

## 2012-02-15 NOTE — Progress Notes (Signed)
No further bleeding.  Looks well

## 2012-02-15 NOTE — Progress Notes (Signed)
ANTICOAGULATION CONSULT NOTE - Follow Up Consult  Pharmacy Consult for Heparin/Coumadin Indication: chronic afib and St. Jude MVR  No Known Allergies  Patient Measurements: Height: 5\' 7"  (170.2 cm) Weight: 152 lb 1.9 oz (69 kg) IBW/kg (Calculated) : 66.1  Heparin Dosing Weight:   Vital Signs: Temp: 97.7 F (36.5 C) (12/09 0800) Temp src: Oral (12/09 0800) BP: 121/69 mmHg (12/09 0800) Pulse Rate: 81  (12/09 0800)  Labs:  Basename 02/15/12 0853 02/15/12 0409 02/14/12 2121 02/14/12 1645 02/14/12 0420 02/13/12 0630 02/12/12 1825  HGB -- 9.1* -- 9.7* -- -- --  HCT -- 27.9* -- 29.6* 29.1* -- --  PLT -- 146* -- 168 139* -- --  APTT -- -- -- -- -- -- 33  LABPROT 19.1* -- -- -- 19.3* 18.7* --  INR 1.66* -- -- -- 1.69* 1.62* --  HEPARINUNFRC 0.29* -- <0.10* -- -- -- --  CREATININE -- 0.96 -- -- 0.92 1.18 --  CKTOTAL -- -- -- -- -- -- --  CKMB -- -- -- -- -- -- --  TROPONINI -- -- -- -- -- -- --    Estimated Creatinine Clearance: 51.6 ml/min (by C-G formula based on Cr of 0.96).   Assessment: 78 year old male with chronic afib and St. Jude MVR on warf PTA. DOE and found to have FOBT+ in ED. Colectomy 2/2 mass found during endoscopy.   Anticoag: On Warf PTA for Mechanical MVR and h/o A-fib, Coumadin PTA Dose: 5mg  warfarin Tues/Thurs/Sunday, 2.5mg  all other days (admit INR 2.96 on 11/27).  Warf/Hep held for colectomy.  12/4: Colectomy 12/5: Coum resumed 12/5.  12/6: Rectal bleeding. Tx 2000 to 3300, hep drip stopped. 2 units PRBCs, 1 unit FFP.  12/8: Heparin/Coumadin resumed 12/9: HL 0.29. INR 1.66. Hgb down to 9.1.  ID: afeb, WBC 7.4. No abx.  Cards: h/o HTN, CHF, A-fib, MVR, EF on 11/30 is 25-30%. toprol dose incr by cards 11/29 (hydralazine on hold) on home zocor, lasix. do not want home lovaza 2nd bleed  Endo: no DM, glucose 133; TSH ok  GI/Nutr: OR12/4 for transverse colon mass found during endoscopy; 3 sessile polyps in cecum. New colon ca. Advanced to regular diet.  Entereg 12/4>> 12/9 now d/c'd. PO PPI daily.  Neph: Scr 0.96  Heme/Onc: new colon cancer. OR 12/4, 12/6 rectal bleeding. Hgb down to 7.5. Iron studies low 11/28 (tsat 5%), Ferrlicit x 1, PO Fe 11/29->12/4. 2 more units 12/6; 1 unit FFP  PTA Med Issues: hydral,lovaza, warfarin on hold  Best Practices: PPI, heparin, Coumadin  Goal of Therapy:  INR 2.5-3.5 Heparin level 0.3-0.5 with recent GIB Monitor platelets by anticoagulation protocol: Yes   Plan:  Increase heparin to 1200 units/hr. Called RN to change. Coumadin 6mg  po x 1 today.  Merilynn Finland, [employer] 02/15/2012,9:57 AM

## 2012-02-15 NOTE — Progress Notes (Signed)
Utilization review completed.  

## 2012-02-15 NOTE — Progress Notes (Addendum)
[facility] PROGRESS NOTE  Alfred Dennis JYN:[id number] DOB: Aug 03, 1923 DOA: 02/03/2012 PCP: No primary provider on file.  Assessment/Plan: Acute on chronic systolic CHF  -EF-25-30  - continue metoprolol 50mg  BID, lasix per cards -Appreciate cardiology recommendations  -Daily weights, strict I.'s and O.'s  -neg 4.7L for the admission  -TSH 3.922  - cards following  Adenocarcinoma of Transverse colon -Noted on colonoscopy  -s/p R-hemicolectomy 12/4 per CCS -stopped IVF  NSVTach : 12/8 and 12/5, continue BB, monitor electrolytes -due to cardiomyopathy   Blood loss Anemia/Iron deficiency  Recurrent Gi bleed this 12/6, resumed IV heparin 12/8 after discussion with surgery. -Transfused 2 units PRBCs 11/27--hemoglobin dropped by 2 grams 12/6, transfused 2 units PRBC 12/6, Hb stable since -continue iron supplementation   Chronic atrial fibrillation/history of St. Jude mitral valve replacement  -Continue metoprolol dose increased  -Rate controlled currently  - restarted IV heparin without bolus and coumadin    Procedures:  Colonoscopy and EGD 02/08/2012 Laparoscopic partial colectomy for transverse colon cancer and sessile cecal polyps 02/10/12   Family Communication:   Patient at bedside Disposition Plan:   Transfer to tele later today if stable    Procedures/Studies: Dg Chest 2 View  02/03/2012  *RADIOLOGY REPORT*  Clinical Data: Short of breath.  Weakness.  CHEST - 2 VIEW  Comparison: None.  Findings: Cardiomegaly.  Pulmonary vascular congestion.  Elevation of the left hemidiaphragm.  Left basilar atelectasis.  No focal consolidation.  Mitral valve replacement.  Interstitial pulmonary edema is present.  IMPRESSION: Cardiomegaly, pulmonary vascular congestion and interstitial pulmonary edema compatible with mild CHF.   Original Report Authenticated By: Andreas Newport, M.D.    Ct Chest W Contrast  02/09/2012  *RADIOLOGY REPORT*  Clinical Data:  Status post  colonoscopy 02/08/2012 where a circumferential ulcerated mass was identified transverse colon.  CT CHEST, ABDOMEN AND PELVIS WITH CONTRAST  Technique:  Multidetector CT imaging of the chest, abdomen and pelvis was performed following the standard protocol during bolus administration of intravenous contrast.  Contrast: OMNIPAQUE IOHEXOL 300 MG/ML  SOLN  Comparison:  Plain films of the chest 02/03/2012.  CT CHEST  Findings:  There is no axillary, hilar or mediastinal lymphadenopathy.  Trace pleural fluid on the left is noted.  There is no right pleural effusion or pericardial effusion.  Massive cardiomegaly is identified.  The patient is status post CABG and mitral valve replacement.  No pulmonary nodule or mass is identified.  There is some mild dependent atelectasis.  No focal bony abnormality is identified.  IMPRESSION:  1.  Negative for metastatic or acute disease. 2.  Massive cardiomegaly.  CT ABDOMEN AND PELVIS  Findings:  The liver, adrenal glands, spleen, pancreas and kidneys all appear normal.  Multiple stones are seen within the gallbladder but there is no CT evidence of cholecystitis.  The patient has a left inguinal hernia containing a loop of descending colon.  No obstruction or other complicating feature is identified.  Scattered colonic diverticula without diverticulitis are noted.  Focal wall thickening is seen in the transverse colon just distal to the hepatic flexure which may represent the patient's mass seen on colonoscopy. The walls of the rectosigmoid colon and cecum are thickened although these loops are under distended.  Stomach, small bowel and appendix are normal in appearance.  The prostate gland is mildly enlarged.  Urinary bladder appears normal.  There is no lymphadenopathy or fluid.  No focal bony abnormality is identified.  IMPRESSION:  1.  Negative for acute  or metastatic disease. 2.  Wall thickening in the transverse colon just distal to the hepatic flexure may represent the  patient's known colon mass.  Wall thickening of the cecum and rectosigmoid colon is likely due to under distention.  3.  Mild enlargement of the prostate gland. 4.  Diverticulosis without diverticulitis. 5.  Left inguinal hernia contains a knuckle of colon without obstruction or other complicating feature. 6.  Gallstones without cholecystitis.   Original Report Authenticated By: Holley Dexter, M.D.    Ct Abdomen Pelvis W Contrast  02/09/2012  *RADIOLOGY REPORT*  Clinical Data:  Status post colonoscopy 02/08/2012 where a circumferential ulcerated mass was identified transverse colon.  CT CHEST, ABDOMEN AND PELVIS WITH CONTRAST  Technique:  Multidetector CT imaging of the chest, abdomen and pelvis was performed following the standard protocol during bolus administration of intravenous contrast.  Contrast: OMNIPAQUE IOHEXOL 300 MG/ML  SOLN  Comparison:  Plain films of the chest 02/03/2012.  CT CHEST  Findings:  There is no axillary, hilar or mediastinal lymphadenopathy.  Trace pleural fluid on the left is noted.  There is no right pleural effusion or pericardial effusion.  Massive cardiomegaly is identified.  The patient is status post CABG and mitral valve replacement.  No pulmonary nodule or mass is identified.  There is some mild dependent atelectasis.  No focal bony abnormality is identified.  IMPRESSION:  1.  Negative for metastatic or acute disease. 2.  Massive cardiomegaly.  CT ABDOMEN AND PELVIS  Findings:  The liver, adrenal glands, spleen, pancreas and kidneys all appear normal.  Multiple stones are seen within the gallbladder but there is no CT evidence of cholecystitis.  The patient has a left inguinal hernia containing a loop of descending colon.  No obstruction or other complicating feature is identified.  Scattered colonic diverticula without diverticulitis are noted.  Focal wall thickening is seen in the transverse colon just distal to the hepatic flexure which may represent the patient's  mass seen on colonoscopy. The walls of the rectosigmoid colon and cecum are thickened although these loops are under distended.  Stomach, small bowel and appendix are normal in appearance.  The prostate gland is mildly enlarged.  Urinary bladder appears normal.  There is no lymphadenopathy or fluid.  No focal bony abnormality is identified.  IMPRESSION:  1.  Negative for acute or metastatic disease. 2.  Wall thickening in the transverse colon just distal to the hepatic flexure may represent the patient's known colon mass.  Wall thickening of the cecum and rectosigmoid colon is likely due to under distention.  3.  Mild enlargement of the prostate gland. 4.  Diverticulosis without diverticulitis. 5.  Left inguinal hernia contains a knuckle of colon without obstruction or other complicating feature. 6.  Gallstones without cholecystitis.   Original Report Authenticated By: Holley Dexter, M.D.          Subjective:  C/o L nostril being blocked, breathing otherwise ok, BM today no bloodetter,  Objective: Filed Vitals:   02/14/12 2013 02/14/12 2332 02/15/12 0337 02/15/12 0800  BP: 109/51 119/45 112/61 121/69  Pulse: 90 93 79 81  Temp: 98.1 F (36.7 C) 97.8 F (36.6 C) 97.9 F (36.6 C) 97.7 F (36.5 C)  TempSrc: Oral Oral Oral Oral  Resp: 19 17 18 19   Height:      Weight:   69 kg (152 lb 1.9 oz)   SpO2: 99% 97% 99% 97%    Intake/Output Summary (Last 24 hours) at 02/15/12 1218 Last data  filed at 02/15/12 1000  Gross per 24 hour  Intake  643.1 ml  Output    425 ml  Net  218.1 ml   Weight change:  Exam:   General:  Pt is alert, follows commands appropriately, not in acute distress  HEENT: No icterus, No thrush, No neck mass, [place]/AT  Cardiovascular: RRR, S1/S2, metallic click, no rubs, no gallops  Respiratory: CTA bilaterally, no wheezing, no crackles, no rhonchi  Abdomen: Soft, surgical incision noted, mild  tenderness as expected, non distended, no guarding  Extremities: No  edema, No lymphangitis, No petechiae, No rashes, no synovitis  Data Reviewed: Basic Metabolic Panel:  Lab 02/15/12 5409 02/14/12 0420 02/13/12 0630 02/12/12 0535 02/11/12 0437  NA 139 141 140 138 139  K 3.7 4.3 4.1 4.3 4.2  CL 107 112 109 103 105  CO2 25 22 24 25 25   GLUCOSE 100* 116* 116* 133* 121*  BUN 24* 21 28* 17 13  CREATININE 0.96 0.92 1.18 1.41* 1.04  CALCIUM 8.5 8.7 8.6 8.9 9.1  MG -- -- -- -- --  PHOS -- -- -- -- --   Liver Function Tests: No results found for this basename: AST:5,ALT:5,ALKPHOS:5,BILITOT:5,PROT:5,ALBUMIN:5 in the last 168 hours No results found for this basename: LIPASE:5,AMYLASE:5 in the last 168 hours No results found for this basename: AMMONIA:5 in the last 168 hours CBC:  Lab 02/15/12 0409 02/14/12 1645 02/14/12 0420 02/13/12 2150 02/13/12 1320  WBC 7.4 9.4 9.2 9.7 9.7  NEUTROABS -- -- -- -- --  HGB 9.1* 9.7* 9.7* 9.6* 9.3*  HCT 27.9* 29.6* 29.1* 28.8* 27.7*  MCV 81.8 81.5 81.7 80.2 78.9  PLT 146* 168 139* 141* 147*   Cardiac Enzymes: No results found for this basename: CKTOTAL:5,CKMB:5,CKMBINDEX:5,TROPONINI:5 in the last 168 hours BNP: No components found with this basename: POCBNP:5 CBG: No results found for this basename: GLUCAP:5 in the last 168 hours  Recent Results (from the past 240 hour(s))  SURGICAL PCR SCREEN     Status: Normal   Collection Time   02/10/12  5:14 AM      Component Value Range Status Comment   MRSA, PCR NEGATIVE  NEGATIVE Final    Staphylococcus aureus NEGATIVE  NEGATIVE Final      Scheduled Meds:    . diphenhydrAMINE  6.25-12.5 mg Oral QHS,MR X 1  . fluticasone  2 spray Each Nare Daily  . furosemide  40 mg Oral Daily  . metoprolol tartrate  50 mg Oral BID  . pantoprazole  40 mg Oral QAC lunch  . simvastatin  10 mg Oral q1800  . sodium chloride  10 mL Intravenous Q12H  . [COMPLETED] warfarin  2.5 mg Oral ONCE-1800  . warfarin  6 mg Oral ONCE-1800  . Warfarin - Pharmacist Dosing Inpatient   Does not apply  q1800  . [DISCONTINUED] alvimopan  12 mg Oral BID  . [DISCONTINUED] sodium chloride  3 mL Intravenous Q12H   Continuous Infusions:    . heparin 1,200 Units/hr (02/15/12 1143)     Zannie Cove, MD  [facility] Pager [phone]  If 7PM-7AM, please contact night-coverage [email or web address] Password [email or web address] 02/15/2012, 12:18 PM   LOS: 12 days

## 2012-02-16 LAB — CBC
MCH: 27 pg (ref 26.0–34.0)
MCHC: 32.7 g/dL (ref 30.0–36.0)
Platelets: 187 10*3/uL (ref 150–400)
Platelets: 214 10*3/uL (ref 150–400)
RBC: 3.82 MIL/uL — ABNORMAL LOW (ref 4.22–5.81)
RDW: 21.9 % — ABNORMAL HIGH (ref 11.5–15.5)
RDW: 21.9 % — ABNORMAL HIGH (ref 11.5–15.5)
WBC: 7 10*3/uL (ref 4.0–10.5)

## 2012-02-16 LAB — PROTIME-INR
INR: 1.59 — ABNORMAL HIGH (ref 0.00–1.49)
Prothrombin Time: 18.5 seconds — ABNORMAL HIGH (ref 11.6–15.2)

## 2012-02-16 LAB — BASIC METABOLIC PANEL
Calcium: 8.6 mg/dL (ref 8.4–10.5)
GFR calc Af Amer: 76 mL/min — ABNORMAL LOW (ref >90)
GFR calc non Af Amer: 65 mL/min — ABNORMAL LOW (ref >90)
Glucose, Bld: 101 mg/dL — ABNORMAL HIGH (ref 70–99)
Potassium: 3.3 mEq/L — ABNORMAL LOW (ref 3.5–5.1)
Sodium: 140 mEq/L (ref 135–145)

## 2012-02-16 LAB — HEPARIN LEVEL (UNFRACTIONATED): Heparin Unfractionated: 0.4 IU/mL (ref 0.30–0.70)

## 2012-02-16 MED ORDER — METOPROLOL TARTRATE 25 MG PO TABS
25.0000 mg | ORAL_TABLET | Freq: Once | ORAL | Status: AC
  Administered 2012-02-16: 25 mg via ORAL
  Filled 2012-02-16: qty 1

## 2012-02-16 MED ORDER — WARFARIN SODIUM 6 MG PO TABS
6.0000 mg | ORAL_TABLET | Freq: Once | ORAL | Status: AC
  Administered 2012-02-16: 6 mg via ORAL
  Filled 2012-02-16 (×2): qty 1

## 2012-02-16 MED ORDER — METOPROLOL TARTRATE 50 MG PO TABS
75.0000 mg | ORAL_TABLET | Freq: Two times a day (BID) | ORAL | Status: DC
  Administered 2012-02-16 – 2012-02-18 (×4): 75 mg via ORAL
  Filled 2012-02-16 (×6): qty 1

## 2012-02-16 NOTE — Progress Notes (Signed)
6 Days Post-Op  Subjective: BM.  Tolerating diet.  Objective: Vital signs in last 24 hours: Temp:  [97.5 F (36.4 C)-98.1 F (36.7 C)] 97.9 F (36.6 C) (12/10 0727) Pulse Rate:  [71-107] 71  (12/10 0300) Resp:  [15-17] 17  (12/10 0300) BP: (96-124)/(52-74) 123/61 mmHg (12/10 0300) SpO2:  [95 %-98 %] 98 % (12/10 0300) Weight:  [151 lb 10.8 oz (68.8 kg)] 151 lb 10.8 oz (68.8 kg) (12/10 0600) Last BM Date: 02/15/12  Intake/Output from previous day: 12/09 0701 - 12/10 0700 In: 1088.7 [P.O.:900; I.V.:188.7] Out: 1575 [Urine:1575] Intake/Output this shift: Total I/O In: -  Out: 300 [Urine:300]  Incision/Wound:clean dry intact.  Soft ND  Lab Results:   [facility] 02/16/12 0615 02/15/12 1635  WBC 6.4 7.1  HGB 9.9* 9.5*  HCT 30.3* 29.2*  PLT 187 173   BMET  Basename 02/16/12 0615 02/15/12 0409  NA 140 139  K 3.3* 3.7  CL 108 107  CO2 25 25  GLUCOSE 101* 100*  BUN 28* 24*  CREATININE 1.01 0.96  CALCIUM 8.6 8.5   PT/INR  Basename 02/16/12 0615 02/15/12 0853  LABPROT 18.5* 19.1*  INR 1.59* 1.66*   ABG No results found for this basename: PHART:2,PCO2:2,PO2:2,HCO3:2 in the last 72 hours  Studies/Results: No results found.  Anti-infectives: Anti-infectives     Start     Dose/Rate Route Frequency Ordered Stop   02/10/12 1130   metroNIDAZOLE (FLAGYL) IVPB 500 mg  Status:  Discontinued        500 mg 100 mL/hr over 60 Minutes Intravenous To Surgery 02/10/12 1129 02/10/12 1604          Assessment/Plan: s/p Procedure(s) (LRB) with comments: LAPAROSCOPIC PARTIAL COLECTOMY (N/A) Looks great. No further bleeding.  Path T3N1.  Will need oncology to see at some point.  Bowels moving and tolerating diet.  Can go home anytime from surgery standpoint once other issues resolved.  LOS: 13 days    Darci Lykins A. 02/16/2012

## 2012-02-16 NOTE — Progress Notes (Signed)
Spoke with Dr. Leodis Binet heartrate. Gave am dose of lopressor and Dr. Increased amount. Wants to monitor for now and transfer when HR is sustaining and stable in 100's. Will continue to monitor and transfer when ready.

## 2012-02-16 NOTE — Progress Notes (Signed)
Pt arrived to 4735 via wheelchair from 3300. Pt alert and oriented to unit.  VSS. Hep @ 12.  Will con't to monitor.

## 2012-02-16 NOTE — Progress Notes (Signed)
ANTICOAGULATION CONSULT NOTE - Follow Up Consult  Pharmacy Consult for Heparin/Coumadin Indication: chronic afib and St. Jude MVR  No Known Allergies  Patient Measurements: Height: 5\' 7"  (170.2 cm) Weight: 151 lb 10.8 oz (68.8 kg) IBW/kg (Calculated) : 66.1   Vital Signs: Temp: 97.7 F (36.5 C) (12/10 1230) Temp src: Oral (12/10 1230) BP: 106/69 mmHg (12/10 0727) Pulse Rate: 71  (12/10 0300)  Labs:  Basename 02/16/12 0615 02/15/12 1635 02/15/12 0853 02/15/12 0409 02/14/12 2121 02/14/12 0420  HGB 9.9* 9.5* -- -- -- --  HCT 30.3* 29.2* -- 27.9* -- --  PLT 187 173 -- 146* -- --  APTT -- -- -- -- -- --  LABPROT 18.5* -- 19.1* -- -- 19.3*  INR 1.59* -- 1.66* -- -- 1.69*  HEPARINUNFRC 0.40 -- 0.29* -- <0.10* --  CREATININE 1.01 -- -- 0.96 -- 0.92  CKTOTAL -- -- -- -- -- --  CKMB -- -- -- -- -- --  TROPONINI -- -- -- -- -- --    Estimated Creatinine Clearance: 49.1 ml/min (by C-G formula based on Cr of 1.01).   Assessment: 78 year old male with chronic afib and St. Jude MVR on warf PTA. DOE and found to have FOBT+ in ED. Colectomy 2/2 mass found during endoscopy.   INR subtherapeutic and trend down, receiving heparin gtt as bridge. Xa level is therapeutic with h/h stable.  Goal of Therapy:  INR 2.5-3.5 Heparin level 0.3-0.5 with recent GIB Monitor platelets by anticoagulation protocol: Yes   Plan:  Continue heparin gtt at 1200 units/hr, and repeat coumadin 6mg  today, f/u am labs.  Verlene Mayer, PharmD, BCPS Pager [phone] 02/16/2012,12:32 PM

## 2012-02-16 NOTE — Progress Notes (Addendum)
[facility] PROGRESS NOTE  Alfred Dennis ZOX:[id number] DOB: 10-24-1923 DOA: 02/03/2012 PCP: No primary provider on file.  Alfred Dennis is a 78 y.o. Male with h/o afib, mechanical MVR st Jude's valve on coumadin, CHF last EF 40-45% who presents with Shortness of breath,  He states that for the past 3weeks he has had SOB with minimal exertion that has worsened. In the ED CXR showed Cardiomegaly, pulmonary vascular congestion and interstitial pulmonary edema compatible with mild CHF, BNP elevated and labs showed hgb of 6.7, Rectal exam per EDP was guaiac +, brown stool, INR IS 2.96.   Subsequent evaluation led to the detection of colon mass which turned out to adenocarcinoma, and underwent partial colectomy  Assessment/Plan: Acute on chronic systolic CHF  -EF-25-30  -Continue metoprolol, increase to 75mg  BID, PO lasix -Appreciate cardiology recommendations  -Daily weights, strict I.'s and O.'s  -neg 5.1L for the admission  -TSH 3.922  - Cards following  Adenocarcinoma of Transverse colon, T3N1 -Noted on colonoscopy  -s/p R-hemicolectomy 12/4 per [facility], did well post op, tolerating regular diet now -Made Oncology FU with Dr.Sherill 12/23 at 1:30PM  NSVTach : 12/8 and 12/5, continue BB, monitor electrolytes -due to cardiomyopathy    Acute Blood loss Anemia Recurrent Gi bleed off and on, on admission, then post op 12/6, appears to have finally resolved now - Resumed IV heparin 12/8 after discussion with surgery. -Transfused 2 units PRBCs 11/27--and 2 units PRBC 12/6, Hb stable since -continue iron supplementation   Chronic atrial fibrillation/history of St. Jude mitral valve replacement  -Continue metoprolol dose increased to 75mg  BID -Rate was slightly higher this am - continue IV heparin with coumadin, goal INR 2-3    Procedures:  Colonoscopy and EGD 02/08/2012 Laparoscopic partial colectomy for transverse colon cancer and sessile cecal polyps 02/10/12   Family  Communication:   Patient at bedside Disposition Plan:   Transfer to tele today     Procedures/Studies: Dg Chest 2 View  02/03/2012  *RADIOLOGY REPORT*  Clinical Data: Short of breath.  Weakness.  CHEST - 2 VIEW  Comparison: None.  Findings: Cardiomegaly.  Pulmonary vascular congestion.  Elevation of the left hemidiaphragm.  Left basilar atelectasis.  No focal consolidation.  Mitral valve replacement.  Interstitial pulmonary edema is present.  IMPRESSION: Cardiomegaly, pulmonary vascular congestion and interstitial pulmonary edema compatible with mild CHF.   Original Report Authenticated By: Andreas Newport, M.D.    Ct Chest W Contrast  02/09/2012  *RADIOLOGY REPORT*  Clinical Data:  Status post colonoscopy 02/08/2012 where a circumferential ulcerated mass was identified transverse colon.  CT CHEST, ABDOMEN AND PELVIS WITH CONTRAST  Technique:  Multidetector CT imaging of the chest, abdomen and pelvis was performed following the standard protocol during bolus administration of intravenous contrast.  Contrast: OMNIPAQUE IOHEXOL 300 MG/ML  SOLN  Comparison:  Plain films of the chest 02/03/2012.  CT CHEST  Findings:  There is no axillary, hilar or mediastinal lymphadenopathy.  Trace pleural fluid on the left is noted.  There is no right pleural effusion or pericardial effusion.  Massive cardiomegaly is identified.  The patient is status post CABG and mitral valve replacement.  No pulmonary nodule or mass is identified.  There is some mild dependent atelectasis.  No focal bony abnormality is identified.  IMPRESSION:  1.  Negative for metastatic or acute disease. 2.  Massive cardiomegaly.  CT ABDOMEN AND PELVIS  Findings:  The liver, adrenal glands, spleen, pancreas and kidneys all appear normal.  Multiple  stones are seen within the gallbladder but there is no CT evidence of cholecystitis.  The patient has a left inguinal hernia containing a loop of descending colon.  No obstruction or other complicating  feature is identified.  Scattered colonic diverticula without diverticulitis are noted.  Focal wall thickening is seen in the transverse colon just distal to the hepatic flexure which may represent the patient's mass seen on colonoscopy. The walls of the rectosigmoid colon and cecum are thickened although these loops are under distended.  Stomach, small bowel and appendix are normal in appearance.  The prostate gland is mildly enlarged.  Urinary bladder appears normal.  There is no lymphadenopathy or fluid.  No focal bony abnormality is identified.  IMPRESSION:  1.  Negative for acute or metastatic disease. 2.  Wall thickening in the transverse colon just distal to the hepatic flexure may represent the patient's known colon mass.  Wall thickening of the cecum and rectosigmoid colon is likely due to under distention.  3.  Mild enlargement of the prostate gland. 4.  Diverticulosis without diverticulitis. 5.  Left inguinal hernia contains a knuckle of colon without obstruction or other complicating feature. 6.  Gallstones without cholecystitis.   Original Report Authenticated By: Holley Dexter, M.D.    Ct Abdomen Pelvis W Contrast  02/09/2012  *RADIOLOGY REPORT*  Clinical Data:  Status post colonoscopy 02/08/2012 where a circumferential ulcerated mass was identified transverse colon.  CT CHEST, ABDOMEN AND PELVIS WITH CONTRAST  Technique:  Multidetector CT imaging of the chest, abdomen and pelvis was performed following the standard protocol during bolus administration of intravenous contrast.  Contrast: OMNIPAQUE IOHEXOL 300 MG/ML  SOLN  Comparison:  Plain films of the chest 02/03/2012.  CT CHEST  Findings:  There is no axillary, hilar or mediastinal lymphadenopathy.  Trace pleural fluid on the left is noted.  There is no right pleural effusion or pericardial effusion.  Massive cardiomegaly is identified.  The patient is status post CABG and mitral valve replacement.  No pulmonary nodule or mass is  identified.  There is some mild dependent atelectasis.  No focal bony abnormality is identified.  IMPRESSION:  1.  Negative for metastatic or acute disease. 2.  Massive cardiomegaly.  CT ABDOMEN AND PELVIS  Findings:  The liver, adrenal glands, spleen, pancreas and kidneys all appear normal.  Multiple stones are seen within the gallbladder but there is no CT evidence of cholecystitis.  The patient has a left inguinal hernia containing a loop of descending colon.  No obstruction or other complicating feature is identified.  Scattered colonic diverticula without diverticulitis are noted.  Focal wall thickening is seen in the transverse colon just distal to the hepatic flexure which may represent the patient's mass seen on colonoscopy. The walls of the rectosigmoid colon and cecum are thickened although these loops are under distended.  Stomach, small bowel and appendix are normal in appearance.  The prostate gland is mildly enlarged.  Urinary bladder appears normal.  There is no lymphadenopathy or fluid.  No focal bony abnormality is identified.  IMPRESSION:  1.  Negative for acute or metastatic disease. 2.  Wall thickening in the transverse colon just distal to the hepatic flexure may represent the patient's known colon mass.  Wall thickening of the cecum and rectosigmoid colon is likely due to under distention.  3.  Mild enlargement of the prostate gland. 4.  Diverticulosis without diverticulitis. 5.  Left inguinal hernia contains a knuckle of colon without obstruction or other complicating  feature. 6.  Gallstones without cholecystitis.   Original Report Authenticated By: Holley Dexter, M.D.       Subjective:  No complaints today, had some mild abd cramps, No further BMS or blood in stools  Objective: Filed Vitals:   02/16/12 0600 02/16/12 0727 02/16/12 1230 02/16/12 1448  BP:  106/69 105/51 105/63  Pulse:    78  Temp:  97.9 F (36.6 C) 97.7 F (36.5 C) 98.3 F (36.8 C)  TempSrc:  Oral Oral Oral   Resp:    18  Height:    5\' 7"  (1.702 m)  Weight: 68.8 kg (151 lb 10.8 oz)   68.402 kg (150 lb 12.8 oz)  SpO2:  98%  98%    Intake/Output Summary (Last 24 hours) at 02/16/12 1508 Last data filed at 02/16/12 1230  Gross per 24 hour  Intake    456 ml  Output   1525 ml  Net  -1069 ml   Weight change: -0.2 kg (-7.1 oz) Exam:   General:  Pt is alert, follows commands appropriately, not in acute distress  HEENT: No icterus, No thrush, No neck mass, [place]/AT  Cardiovascular: RRR, S1/S2, metallic click, no rubs, no gallops  Respiratory: CTA bilaterally, no wheezing, no crackles, no rhonchi  Abdomen: Soft, surgical incisions noted, non tender, BS present, non distended, no guarding  Extremities: No edema, No lymphangitis, No petechiae, No rashes, no synovitis  Data Reviewed: Basic Metabolic Panel:  Lab 02/16/12 4540 02/15/12 0409 02/14/12 0420 02/13/12 0630 02/12/12 0535  NA 140 139 141 140 138  K 3.3* 3.7 4.3 4.1 4.3  CL 108 107 112 109 103  CO2 25 25 22 24 25   GLUCOSE 101* 100* 116* 116* 133*  BUN 28* 24* 21 28* 17  CREATININE 1.01 0.96 0.92 1.18 1.41*  CALCIUM 8.6 8.5 8.7 8.6 8.9  MG -- -- -- -- --  PHOS -- -- -- -- --   Liver Function Tests: No results found for this basename: AST:5,ALT:5,ALKPHOS:5,BILITOT:5,PROT:5,ALBUMIN:5 in the last 168 hours No results found for this basename: LIPASE:5,AMYLASE:5 in the last 168 hours No results found for this basename: AMMONIA:5 in the last 168 hours CBC:  Lab 02/16/12 0615 02/15/12 1635 02/15/12 0409 02/14/12 1645 02/14/12 0420  WBC 6.4 7.1 7.4 9.4 9.2  NEUTROABS -- -- -- -- --  HGB 9.9* 9.5* 9.1* 9.7* 9.7*  HCT 30.3* 29.2* 27.9* 29.6* 29.1*  MCV 82.6 82.3 81.8 81.5 81.7  PLT 187 173 146* 168 139*   Cardiac Enzymes: No results found for this basename: CKTOTAL:5,CKMB:5,CKMBINDEX:5,TROPONINI:5 in the last 168 hours BNP: No components found with this basename: POCBNP:5 CBG: No results found for this basename: GLUCAP:5 in the  last 168 hours  Recent Results (from the past 240 hour(s))  SURGICAL PCR SCREEN     Status: Normal   Collection Time   02/10/12  5:14 AM      Component Value Range Status Comment   MRSA, PCR NEGATIVE  NEGATIVE Final    Staphylococcus aureus NEGATIVE  NEGATIVE Final      Scheduled Meds:    . diphenhydrAMINE  6.25-12.5 mg Oral QHS,MR X 1  . fluticasone  2 spray Each Nare Daily  . furosemide  40 mg Oral Daily  . [COMPLETED] metoprolol tartrate  25 mg Oral Once  . metoprolol tartrate  75 mg Oral BID  . pantoprazole  40 mg Oral QAC lunch  . simvastatin  10 mg Oral q1800  . sodium chloride  10 mL Intravenous Q12H  . [  COMPLETED] warfarin  6 mg Oral ONCE-1800  . warfarin  6 mg Oral ONCE-1800  . Warfarin - Pharmacist Dosing Inpatient   Does not apply q1800  . [DISCONTINUED] metoprolol tartrate  50 mg Oral BID   Continuous Infusions:    . heparin 1,200 Units/hr (02/16/12 1300)     Zannie Cove, MD  [facility] Pager [phone]  If 7PM-7AM, please contact night-coverage [email or web address] Password [email or web address] 02/16/2012, 3:08 PM   LOS: 13 days

## 2012-02-16 NOTE — Progress Notes (Signed)
Transferred to 4700, VSS.

## 2012-02-17 LAB — CBC
HCT: 27.6 % — ABNORMAL LOW (ref 39.0–52.0)
MCH: 27.1 pg (ref 26.0–34.0)
MCHC: 32.2 g/dL (ref 30.0–36.0)
MCV: 82.9 fL (ref 78.0–100.0)
Platelets: 187 10*3/uL (ref 150–400)
RBC: 3.62 MIL/uL — ABNORMAL LOW (ref 4.22–5.81)
RDW: 21.6 % — ABNORMAL HIGH (ref 11.5–15.5)
RDW: 21.5 % — ABNORMAL HIGH (ref 11.5–15.5)
WBC: 6.3 10*3/uL (ref 4.0–10.5)
WBC: 5.8 10*3/uL (ref 4.0–10.5)

## 2012-02-17 LAB — PROTIME-INR: Prothrombin Time: 22.8 seconds — ABNORMAL HIGH (ref 11.6–15.2)

## 2012-02-17 LAB — BASIC METABOLIC PANEL
CO2: 26 mEq/L (ref 19–32)
Calcium: 8.8 mg/dL (ref 8.4–10.5)
Chloride: 109 mEq/L (ref 96–112)
Creatinine, Ser: 1.12 mg/dL (ref 0.50–1.35)
GFR calc Af Amer: 67 mL/min — ABNORMAL LOW (ref >90)
Sodium: 142 mEq/L (ref 135–145)

## 2012-02-17 LAB — HEPARIN LEVEL (UNFRACTIONATED): Heparin Unfractionated: 0.41 IU/mL (ref 0.30–0.70)

## 2012-02-17 MED ORDER — POLYETHYLENE GLYCOL 3350 17 G PO PACK
17.0000 g | PACK | Freq: Every day | ORAL | Status: DC
  Administered 2012-02-17 – 2012-02-18 (×2): 17 g via ORAL
  Filled 2012-02-17 (×2): qty 1

## 2012-02-17 MED ORDER — WARFARIN SODIUM 4 MG PO TABS
4.0000 mg | ORAL_TABLET | Freq: Once | ORAL | Status: AC
  Administered 2012-02-17: 4 mg via ORAL
  Filled 2012-02-17: qty 1

## 2012-02-17 NOTE — Progress Notes (Signed)
ANTICOAGULATION CONSULT NOTE - Follow Up Consult  Pharmacy Consult for Heparin and Coumadin Indication: Mechanical MVR, Afib  No Known Allergies  Patient Measurements: Height: 5\' 7"  (170.2 cm) Weight: 149 lb 0.5 oz (67.6 kg) (bs) IBW/kg (Calculated) : 66.1  Heparin Dosing Weight: 67.6kg  Vital Signs: Temp: 98 F (36.7 C) (12/11 0459) Temp src: Oral (12/11 0459) BP: 115/63 mmHg (12/11 0459) Pulse Rate: 80  (12/11 0459)  Labs:  Alvira Philips 02/17/12 1610 02/16/12 1841 02/16/12 0615 02/15/12 0853 02/15/12 0409  HGB 9.8* 10.1* -- -- --  HCT 30.0* 31.6* 30.3* -- --  PLT 187 214 187 -- --  APTT -- -- -- -- --  LABPROT 22.8* -- 18.5* 19.1* --  INR 2.11* -- 1.59* 1.66* --  HEPARINUNFRC 0.41 -- 0.40 0.29* --  CREATININE 1.12 -- 1.01 -- 0.96  CKTOTAL -- -- -- -- --  CKMB -- -- -- -- --  TROPONINI -- -- -- -- --    Estimated Creatinine Clearance: 44.3 ml/min (by C-G formula based on Cr of 1.12).   Medications:  Heparin 1200 units/hr  Assessment: 88yom on heparin bridging to Coumadin for hx MVR (St Jude) and chronic Afib. INR (2.11) is subtherapeutic but trending up. Heparin level (0.41) is therapeutic (goal 0.3-0.5 with recent GI bleed) - continue current rate and bridge to therapeutic INR.  - H/H and Plts stable - No significant bleeding reported - Coumadin PTA regimen: 2.5mg  daily except 5mg  Tue/Thur/Sun  Goal of Therapy:  Heparin level 0.3-0.5 units/ml with recent GI bleed INR 2.5-3.5 with mechanical MVR Monitor platelets by anticoagulation protocol: Yes   Plan:  1. Continue heparin drip 1200 units/hr (12 ml/hr) 2. Coumadin 4mg  po x 1 today 3. Follow-up AM INR, heparin level and CBC  Cleon Dew [phone] 02/17/2012,10:01 AM

## 2012-02-17 NOTE — Progress Notes (Signed)
Pt's PCP is Maurice Small with [facility].

## 2012-02-17 NOTE — Progress Notes (Signed)
Physical Therapy Treatment Patient Details Name: Alfred Dennis MRN: [id number] DOB: [birth year] Today's Date: 02/17/2012 Time: 4098-1191 PT Time Calculation (min): 27 min  PT Assessment / Plan / Recommendation Comments on Treatment Session  Moving well today. Agree with HHPT for continued therapies.     Follow Up Recommendations  Home health PT;Supervision/Assistance - 24 hour     Does the patient have the potential to tolerate intense rehabilitation     Barriers to Discharge        Equipment Recommendations  None recommended by PT    Recommendations for Other Services    Frequency Min 3X/week   Plan Discharge plan remains appropriate;Frequency remains appropriate    Precautions / Restrictions Precautions Precautions: Fall Restrictions Weight Bearing Restrictions: No       Mobility  Bed Mobility Bed Mobility: Not assessed Transfers Transfers: Sit to Stand;Stand to Sit Sit to Stand: 4: Min guard;5: Supervision;With upper extremity assist;With armrests;From chair/3-in-1 Stand to Sit: 5: Supervision;With upper extremity assist;With armrests;To chair/3-in-1 Details for Transfer Assistance: 1st attempt at standing pt needing several tries to accomplish with mingaurdA, 2nd attempt after proper set up and sequencing cues pt able to stand with supervision; cues for safe sequencing with use of RW and turning Ambulation/Gait Ambulation/Gait Assistance: 4: Min guard Ambulation Distance (Feet): 175 Feet (100 ft + 75 ft with seate rest break) Assistive device: Rolling walker Ambulation/Gait Assistance Details: cues for tall posture and safe positioning of RW; minA x1 for slight LOB when patient turning but with proper sequencing cues pt able to perform with mingaurdA Gait Pattern: Step-through pattern;Trunk flexed;Shuffle    Exercises General Exercises - Lower Extremity Long Arc Quad: AROM;Both;20 reps;Seated Hip Flexion/Marching: AROM;Both;20 reps;Seated Toe Raises:  AROM;Both;20 reps;Seated Heel Raises: AROM;Both;20 reps;Seated    PT Goals Acute Rehab PT Goals PT Goal: Sit to Stand - Progress: Progressing toward goal PT Goal: Stand to Sit - Progress: Progressing toward goal PT Goal: Ambulate - Progress: Progressing toward goal  Visit Information  Last PT Received On: 02/17/12 Assistance Needed: +1    Subjective Data  Subjective: I am a little tired.  Patient Stated Goal: to get back to grocery shopping with wife without getting too exhausted.   Cognition  Overall Cognitive Status: Appears within functional limits for tasks assessed/performed Arousal/Alertness: Awake/alert Orientation Level: Appears intact for tasks assessed Behavior During Session: [facility] for tasks performed    Balance     End of Session PT - End of Session Equipment Utilized During Treatment: Gait belt Activity Tolerance: Patient tolerated treatment well Patient left: in chair;with call bell/phone within reach Nurse Communication: Mobility status   GP     [facility] Alfred Dennis 02/17/2012, 11:41 AM

## 2012-02-17 NOTE — Progress Notes (Addendum)
[facility] PROGRESS NOTE  [name] YNW:[id number] DOB: Mar 26, 1923 DOA: 02/03/2012 PCP: No primary provider on file.  Alfred Dennis is a 78 y.o. Male with h/o afib, mechanical MVR st Jude's valve on coumadin, CHF last EF 40-45% who presents with Shortness of breath,  He states that for the past 3 weeks he has had SOB with minimal exertion that has worsened. In the ED CXR showed Cardiomegaly, pulmonary vascular congestion and interstitial pulmonary edema compatible with mild CHF, BNP elevated and labs showed hgb of 6.7, Rectal exam per EDP was guaiac +, brown stool, INR IS 2.96.   Subsequent evaluation led to the detection of colon mass which turned out to adenocarcinoma, and underwent partial colectomy He has progressed well and is doing much better overall.    Assessment/Plan: Acute on chronic systolic CHF  -EF-25-30  -Continue metoprolol, increase to 75mg  BID, PO lasix -Appreciate cardiology recommendations  -Daily weights, strict I.'s and O.'s  -neg 5.1L for the admission  -TSH 3.922  - Cards following, appreciate input  Adenocarcinoma of Transverse colon, T3N1 -Noted on colonoscopy  -s/p R-hemicolectomy 12/4 per [facility], did well post op, tolerating regular diet now-staples to be discontinued 02/19/2012. Has not passed a stool since the past 4-5 days. -Made Oncology FU with Dr.Sherill 12/23 at 1:30PM  NSVTach : 12/8 and 12/5, continue BB, monitor electrolytes -due to cardiomyopathy  -stable currently  Blood loss Anemia/Iron deficiency  Recurrent Gi bleed off and on, on admission, then post op 12/6, appears to have finally resolved now - Resumed IV heparin 12/8 after discussion with surgery -Transfused 2 units PRBCs 11/27--and 2 units PRBC 12/6, Hb stable since -continue iron supplementation   Chronic atrial fibrillation/history of St. Jude mitral valve replacement, Chads2Vasc2~3 -Metoprolol dose increased to 75mg  BID -Rate was slightly higher this am, Rate  controlled currently - continue IV heparin with coumadin, goal INR 2.-3.5  Would shoot for a lower goal given his h/o GI bleeding  Impaired glucose tolerance -review as out-patient  Procedures:  Colonoscopy and EGD 02/08/2012 Laparoscopic partial colectomy for transverse colon cancer and sessile cecal polyps 02/10/12   Family Communication:   Patient at bedside Disposition Plan:   Transfer to tele today   Procedures/Studies: Dg Chest 2 View  02/03/2012  *RADIOLOGY REPORT*  Clinical Data: Short of breath.  Weakness.  CHEST - 2 VIEW  Comparison: None.  Findings: Cardiomegaly.  Pulmonary vascular congestion.  Elevation of the left hemidiaphragm.  Left basilar atelectasis.  No focal consolidation.  Mitral valve replacement.  Interstitial pulmonary edema is present.  IMPRESSION: Cardiomegaly, pulmonary vascular congestion and interstitial pulmonary edema compatible with mild CHF.   Original Report Authenticated By: Andreas Newport, M.D.    Ct Chest W Contrast  02/09/2012  *RADIOLOGY REPORT*  Clinical Data:  Status post colonoscopy 02/08/2012 where a circumferential ulcerated mass was identified transverse colon.  CT CHEST, ABDOMEN AND PELVIS WITH CONTRAST  Technique:  Multidetector CT imaging of the chest, abdomen and pelvis was performed following the standard protocol during bolus administration of intravenous contrast.  Contrast: OMNIPAQUE IOHEXOL 300 MG/ML  SOLN  Comparison:  Plain films of the chest 02/03/2012.  CT CHEST  Findings:  There is no axillary, hilar or mediastinal lymphadenopathy.  Trace pleural fluid on the left is noted.  There is no right pleural effusion or pericardial effusion.  Massive cardiomegaly is identified.  The patient is status post CABG and mitral valve replacement.  No pulmonary nodule or mass is identified.  There  is some mild dependent atelectasis.  No focal bony abnormality is identified.  IMPRESSION:  1.  Negative for metastatic or acute disease. 2.  Massive  cardiomegaly.  CT ABDOMEN AND PELVIS  Findings:  The liver, adrenal glands, spleen, pancreas and kidneys all appear normal.  Multiple stones are seen within the gallbladder but there is no CT evidence of cholecystitis.  The patient has a left inguinal hernia containing a loop of descending colon.  No obstruction or other complicating feature is identified.  Scattered colonic diverticula without diverticulitis are noted.  Focal wall thickening is seen in the transverse colon just distal to the hepatic flexure which may represent the patient's mass seen on colonoscopy. The walls of the rectosigmoid colon and cecum are thickened although these loops are under distended.  Stomach, small bowel and appendix are normal in appearance.  The prostate gland is mildly enlarged.  Urinary bladder appears normal.  There is no lymphadenopathy or fluid.  No focal bony abnormality is identified.  IMPRESSION:  1.  Negative for acute or metastatic disease. 2.  Wall thickening in the transverse colon just distal to the hepatic flexure may represent the patient's known colon mass.  Wall thickening of the cecum and rectosigmoid colon is likely due to under distention.  3.  Mild enlargement of the prostate gland. 4.  Diverticulosis without diverticulitis. 5.  Left inguinal hernia contains a knuckle of colon without obstruction or other complicating feature. 6.  Gallstones without cholecystitis.   Original Report Authenticated By: Holley Dexter, M.D.    Ct Abdomen Pelvis W Contrast  02/09/2012  *RADIOLOGY REPORT*  Clinical Data:  Status post colonoscopy 02/08/2012 where a circumferential ulcerated mass was identified transverse colon.  CT CHEST, ABDOMEN AND PELVIS WITH CONTRAST  Technique:  Multidetector CT imaging of the chest, abdomen and pelvis was performed following the standard protocol during bolus administration of intravenous contrast.  Contrast: OMNIPAQUE IOHEXOL 300 MG/ML  SOLN  Comparison:  Plain films of the chest  02/03/2012.  CT CHEST  Findings:  There is no axillary, hilar or mediastinal lymphadenopathy.  Trace pleural fluid on the left is noted.  There is no right pleural effusion or pericardial effusion.  Massive cardiomegaly is identified.  The patient is status post CABG and mitral valve replacement.  No pulmonary nodule or mass is identified.  There is some mild dependent atelectasis.  No focal bony abnormality is identified.  IMPRESSION:  1.  Negative for metastatic or acute disease. 2.  Massive cardiomegaly.  CT ABDOMEN AND PELVIS  Findings:  The liver, adrenal glands, spleen, pancreas and kidneys all appear normal.  Multiple stones are seen within the gallbladder but there is no CT evidence of cholecystitis.  The patient has a left inguinal hernia containing a loop of descending colon.  No obstruction or other complicating feature is identified.  Scattered colonic diverticula without diverticulitis are noted.  Focal wall thickening is seen in the transverse colon just distal to the hepatic flexure which may represent the patient's mass seen on colonoscopy. The walls of the rectosigmoid colon and cecum are thickened although these loops are under distended.  Stomach, small bowel and appendix are normal in appearance.  The prostate gland is mildly enlarged.  Urinary bladder appears normal.  There is no lymphadenopathy or fluid.  No focal bony abnormality is identified.  IMPRESSION:  1.  Negative for acute or metastatic disease. 2.  Wall thickening in the transverse colon just distal to the hepatic flexure may represent the  patient's known colon mass.  Wall thickening of the cecum and rectosigmoid colon is likely due to under distention.  3.  Mild enlargement of the prostate gland. 4.  Diverticulosis without diverticulitis. 5.  Left inguinal hernia contains a knuckle of colon without obstruction or other complicating feature. 6.  Gallstones without cholecystitis.   Original Report Authenticated By: Holley Dexter,  M.D.       Subjective:  Doing well. Ambulance. Tolerating by mouth well. No nausea. No shortness of breath no chest pain. Has not passes stool in 4 days. Abdominal pain seems well controlled. He states to me that he lives with his wife was dementia and she takes care of her.  Objective: Filed Vitals:   02/16/12 1806 02/16/12 2037 02/17/12 0459 02/17/12 1047  BP: 127/66 112/76 115/63 120/60  Pulse: 74 83 80 80  Temp:  98.2 F (36.8 C) 98 F (36.7 C)   TempSrc:  Oral Oral   Resp: 18 19 18    Height:      Weight:   149 lb 0.5 oz (67.6 kg)   SpO2: 98% 98% 98%     Intake/Output Summary (Last 24 hours) at 02/17/12 1311 Last data filed at 02/17/12 1305  Gross per 24 hour  Intake   1332 ml  Output   1125 ml  Net    207 ml   Weight change: -14 oz (-0.397 kg) Exam:   General:  Pt is alert, follows commands appropriately, not in acute distress  HEENT: No icterus, No thrush, No neck mass, [place]/AT  Cardiovascular: RRR, S1/S2, metallic click, no rubs, no gallops  Respiratory: CTA bilaterally, no wheezing, no crackles, no rhonchi  Abdomen: Soft, surgical incisions noted, non tender, BS present, non distended, no guarding-midline scar in abdomen with her E. umbilical incision that is well-healed with staples present  Extremities: No edema, No lymphangitis, No petechiae, No rashes, no synovitis  Data Reviewed: Basic Metabolic Panel:  Lab 02/17/12 1610 02/16/12 0615 02/15/12 0409 02/14/12 0420 02/13/12 0630  NA 142 140 139 141 140  K 3.5 3.3* 3.7 4.3 4.1  CL 109 108 107 112 109  CO2 26 25 25 22 24   GLUCOSE 107* 101* 100* 116* 116*  BUN 23 28* 24* 21 28*  CREATININE 1.12 1.01 0.96 0.92 1.18  CALCIUM 8.8 8.6 8.5 8.7 8.6  MG -- -- -- -- --  PHOS -- -- -- -- --   Liver Function Tests: No results found for this basename: AST:5,ALT:5,ALKPHOS:5,BILITOT:5,PROT:5,ALBUMIN:5 in the last 168 hours No results found for this basename: LIPASE:5,AMYLASE:5 in the last 168 hours No results  found for this basename: AMMONIA:5 in the last 168 hours CBC:  Lab 02/17/12 0635 02/16/12 1841 02/16/12 0615 02/15/12 1635 02/15/12 0409  WBC 6.3 7.0 6.4 7.1 7.4  NEUTROABS -- -- -- -- --  HGB 9.8* 10.1* 9.9* 9.5* 9.1*  HCT 30.0* 31.6* 30.3* 29.2* 27.9*  MCV 82.9 82.7 82.6 82.3 81.8  PLT 187 214 187 173 146*   Cardiac Enzymes: No results found for this basename: CKTOTAL:5,CKMB:5,CKMBINDEX:5,TROPONINI:5 in the last 168 hours BNP: No components found with this basename: POCBNP:5 CBG: No results found for this basename: GLUCAP:5 in the last 168 hours  Recent Results (from the past 240 hour(s))  SURGICAL PCR SCREEN     Status: Normal   Collection Time   02/10/12  5:14 AM      Component Value Range Status Comment   MRSA, PCR NEGATIVE  NEGATIVE Final    Staphylococcus aureus NEGATIVE  NEGATIVE Final      Scheduled Meds:    . diphenhydrAMINE  6.25-12.5 mg Oral QHS,MR X 1  . fluticasone  2 spray Each Nare Daily  . furosemide  40 mg Oral Daily  . metoprolol tartrate  75 mg Oral BID  . pantoprazole  40 mg Oral QAC lunch  . polyethylene glycol  17 g Oral Daily  . simvastatin  10 mg Oral q1800  . sodium chloride  10 mL Intravenous Q12H  . warfarin  4 mg Oral ONCE-1800  . [COMPLETED] warfarin  6 mg Oral ONCE-1800  . Warfarin - Pharmacist Dosing Inpatient   Does not apply q1800   Continuous Infusions:    . heparin 1,200 Units/hr (02/17/12 0854)     Rhetta Mura, MD  [facility] Pager [phone]  If 7PM-7AM, please contact night-coverage [email or web address] Password [email or web address] 02/17/2012, 1:11 PM   LOS: 14 days

## 2012-02-17 NOTE — Progress Notes (Signed)
7 Days Post-Op  Subjective: No complaints  Objective: Vital signs in last 24 hours: Temp:  [97.7 F (36.5 C)-98.3 F (36.8 C)] 98 F (36.7 C) (12/11 0459) Pulse Rate:  [74-83] 80  (12/11 0459) Resp:  [18-19] 18  (12/11 0459) BP: (105-127)/(51-76) 115/63 mmHg (12/11 0459) SpO2:  [98 %] 98 % (12/11 0459) Weight:  [149 lb 0.5 oz (67.6 kg)-150 lb 12.8 oz (68.402 kg)] 149 lb 0.5 oz (67.6 kg) (12/11 0459) Last BM Date: 02/15/12  Intake/Output from previous day: 12/10 0701 - 12/11 0700 In: 972 [P.O.:480; I.V.:492] Out: 1375 [Urine:1375] Intake/Output this shift:    Incision/Wound:C/D/I  Lab Results:   [facility] 02/17/12 0635 02/16/12 1841  WBC 6.3 7.0  HGB 9.8* 10.1*  HCT 30.0* 31.6*  PLT 187 214   BMET  Basename 02/17/12 0635 02/16/12 0615  NA 142 140  K 3.5 3.3*  CL 109 108  CO2 26 25  GLUCOSE 107* 101*  BUN 23 28*  CREATININE 1.12 1.01  CALCIUM 8.8 8.6   PT/INR  Basename 02/17/12 0635 02/16/12 0615  LABPROT 22.8* 18.5*  INR 2.11* 1.59*   ABG No results found for this basename: PHART:2,PCO2:2,PO2:2,HCO3:2 in the last 72 hours  Studies/Results: No results found.  Anti-infectives: Anti-infectives     Start     Dose/Rate Route Frequency Ordered Stop   02/10/12 1130   metroNIDAZOLE (FLAGYL) IVPB 500 mg  Status:  Discontinued        500 mg 100 mL/hr over 60 Minutes Intravenous To Surgery 02/10/12 1129 02/10/12 1604          Assessment/Plan: s/p Procedure(s) (LRB) with comments: LAPAROSCOPIC PARTIAL COLECTOMY (N/A) Miralax daily. Stable from our standpoint. Staples out friday  LOS: 14 days    Ruqayyah Lute A. 02/17/2012

## 2012-02-17 NOTE — Progress Notes (Signed)
SUBJECTIVE:  Doing well. Denies any chest pain or SOB  OBJECTIVE:   Vitals:   Filed Vitals:   02/16/12 1806 02/16/12 2037 02/17/12 0459 02/17/12 1047  BP: 127/66 112/76 115/63 120/60  Pulse: 74 83 80 80  Temp:  98.2 F (36.8 C) 98 F (36.7 C)   TempSrc:  Oral Oral   Resp: 18 19 18    Height:      Weight:   67.6 kg (149 lb 0.5 oz)   SpO2: 98% 98% 98%    I&O's:   Intake/Output Summary (Last 24 hours) at 02/17/12 1132 Last data filed at 02/17/12 0900  Gross per 24 hour  Intake   1092 ml  Output   1275 ml  Net   -183 ml   TELEMETRY: Reviewed telemetry pt in atrial fibrillation     PHYSICAL EXAM General: Well developed, well nourished, in no acute distress Head: Eyes PERRLA, No xanthomas.   Normal cephalic and atramatic  Lungs:   Clear bilaterally to auscultation and percussion. Heart:   HRRR S1 S2 Pulses are 2+ & equal. Abdomen: Bowel sounds are positive, abdomen soft and non-tender without masses  Extremities:   No clubbing, cyanosis or edema.  DP +1 Neuro: Alert and oriented X 3. Psych:  Good affect, responds appropriately   LABS: Basic Metabolic Panel:  Basename 02/17/12 0635 02/16/12 0615  NA 142 140  K 3.5 3.3*  CL 109 108  CO2 26 25  GLUCOSE 107* 101*  BUN 23 28*  CREATININE 1.12 1.01  CALCIUM 8.8 8.6  MG -- --  PHOS -- --   Liver Function Tests: No results found for this basename: AST:2,ALT:2,ALKPHOS:2,BILITOT:2,PROT:2,ALBUMIN:2 in the last 72 hours No results found for this basename: LIPASE:2,AMYLASE:2 in the last 72 hours CBC:  Basename 02/17/12 0635 02/16/12 1841  WBC 6.3 7.0  NEUTROABS -- --  HGB 9.8* 10.1*  HCT 30.0* 31.6*  MCV 82.9 82.7  PLT 187 214   Coag Panel:   Lab Results  Component Value Date   INR 2.11* 02/17/2012   INR 1.59* 02/16/2012   INR 1.66* 02/15/2012    RADIOLOGY: Dg Chest 2 View  02/03/2012  *RADIOLOGY REPORT*  Clinical Data: Short of breath.  Weakness.  CHEST - 2 VIEW  Comparison: None.  Findings: Cardiomegaly.   Pulmonary vascular congestion.  Elevation of the left hemidiaphragm.  Left basilar atelectasis.  No focal consolidation.  Mitral valve replacement.  Interstitial pulmonary edema is present.  IMPRESSION: Cardiomegaly, pulmonary vascular congestion and interstitial pulmonary edema compatible with mild CHF.   Original Report Authenticated By: Andreas Newport, M.D.    Ct Chest W Contrast  02/09/2012  *RADIOLOGY REPORT*  Clinical Data:  Status post colonoscopy 02/08/2012 where a circumferential ulcerated mass was identified transverse colon.  CT CHEST, ABDOMEN AND PELVIS WITH CONTRAST  Technique:  Multidetector CT imaging of the chest, abdomen and pelvis was performed following the standard protocol during bolus administration of intravenous contrast.  Contrast: OMNIPAQUE IOHEXOL 300 MG/ML  SOLN  Comparison:  Plain films of the chest 02/03/2012.  CT CHEST  Findings:  There is no axillary, hilar or mediastinal lymphadenopathy.  Trace pleural fluid on the left is noted.  There is no right pleural effusion or pericardial effusion.  Massive cardiomegaly is identified.  The patient is status post CABG and mitral valve replacement.  No pulmonary nodule or mass is identified.  There is some mild dependent atelectasis.  No focal bony abnormality is identified.  IMPRESSION:  1.  Negative  for metastatic or acute disease. 2.  Massive cardiomegaly.  CT ABDOMEN AND PELVIS  Findings:  The liver, adrenal glands, spleen, pancreas and kidneys all appear normal.  Multiple stones are seen within the gallbladder but there is no CT evidence of cholecystitis.  The patient has a left inguinal hernia containing a loop of descending colon.  No obstruction or other complicating feature is identified.  Scattered colonic diverticula without diverticulitis are noted.  Focal wall thickening is seen in the transverse colon just distal to the hepatic flexure which may represent the patient's mass seen on colonoscopy. The walls of the  rectosigmoid colon and cecum are thickened although these loops are under distended.  Stomach, small bowel and appendix are normal in appearance.  The prostate gland is mildly enlarged.  Urinary bladder appears normal.  There is no lymphadenopathy or fluid.  No focal bony abnormality is identified.  IMPRESSION:  1.  Negative for acute or metastatic disease. 2.  Wall thickening in the transverse colon just distal to the hepatic flexure may represent the patient's known colon mass.  Wall thickening of the cecum and rectosigmoid colon is likely due to under distention.  3.  Mild enlargement of the prostate gland. 4.  Diverticulosis without diverticulitis. 5.  Left inguinal hernia contains a knuckle of colon without obstruction or other complicating feature. 6.  Gallstones without cholecystitis.   Original Report Authenticated By: Holley Dexter, M.D.    Ct Abdomen Pelvis W Contrast  02/09/2012  *RADIOLOGY REPORT*  Clinical Data:  Status post colonoscopy 02/08/2012 where a circumferential ulcerated mass was identified transverse colon.  CT CHEST, ABDOMEN AND PELVIS WITH CONTRAST  Technique:  Multidetector CT imaging of the chest, abdomen and pelvis was performed following the standard protocol during bolus administration of intravenous contrast.  Contrast: OMNIPAQUE IOHEXOL 300 MG/ML  SOLN  Comparison:  Plain films of the chest 02/03/2012.  CT CHEST  Findings:  There is no axillary, hilar or mediastinal lymphadenopathy.  Trace pleural fluid on the left is noted.  There is no right pleural effusion or pericardial effusion.  Massive cardiomegaly is identified.  The patient is status post CABG and mitral valve replacement.  No pulmonary nodule or mass is identified.  There is some mild dependent atelectasis.  No focal bony abnormality is identified.  IMPRESSION:  1.  Negative for metastatic or acute disease. 2.  Massive cardiomegaly.  CT ABDOMEN AND PELVIS  Findings:  The liver, adrenal glands, spleen, pancreas  and kidneys all appear normal.  Multiple stones are seen within the gallbladder but there is no CT evidence of cholecystitis.  The patient has a left inguinal hernia containing a loop of descending colon.  No obstruction or other complicating feature is identified.  Scattered colonic diverticula without diverticulitis are noted.  Focal wall thickening is seen in the transverse colon just distal to the hepatic flexure which may represent the patient's mass seen on colonoscopy. The walls of the rectosigmoid colon and cecum are thickened although these loops are under distended.  Stomach, small bowel and appendix are normal in appearance.  The prostate gland is mildly enlarged.  Urinary bladder appears normal.  There is no lymphadenopathy or fluid.  No focal bony abnormality is identified.  IMPRESSION:  1.  Negative for acute or metastatic disease. 2.  Wall thickening in the transverse colon just distal to the hepatic flexure may represent the patient's known colon mass.  Wall thickening of the cecum and rectosigmoid colon is likely due to under  distention.  3.  Mild enlargement of the prostate gland. 4.  Diverticulosis without diverticulitis. 5.  Left inguinal hernia contains a knuckle of colon without obstruction or other complicating feature. 6.  Gallstones without cholecystitis.   Original Report Authenticated By: Holley Dexter, M.D.    Dg Chest Port 1 View  02/12/2012  *RADIOLOGY REPORT*  Clinical Data: PICC placement.  PORTABLE CHEST - 1 VIEW  Comparison: 02/03/2012.  Findings: Interval right PICC with its tip in the upper right atrium.  Stable enlargement of the cardiac silhouette and elevation of the left hemidiaphragm.  Stable post CABG changes and prosthetic heart valve.  Minimal linear scarring at the right lung base is unchanged.  IMPRESSION:  1.  Right PICC tip in the upper right atrium.  If a position at the cavoatrial junction is desired, this could be retracted 1.5 cm. 2.  Stable cardiomegaly,  elevated left hemidiaphragm and minimal right basilar scarring.   Original Report Authenticated By: Beckie Salts, M.D.     ASSESSMENT AND PLAN:  Patient Active Hospital Problem List: CHF (congestive heart failure) (02/03/2012)  Chronic a-fib (02/03/2012)  S/P MVR (mitral valve replacement) (02/03/2012)  GIB (gastrointestinal bleeding) (02/03/2012)  Non-sustained ventricular tachycardia (02/06/2012)    Doing well. Beta blocker resumed for rate control/cardiomyoapty and VTNS  On coumadin and cautious use of IV heparin 2/2 - INR 2.1 this am - will stop Heparin once INR 2.5     Alfred Reichert, MD  02/17/2012  11:32 AM

## 2012-02-18 LAB — BASIC METABOLIC PANEL
Chloride: 110 mEq/L (ref 96–112)
GFR calc Af Amer: 76 mL/min — ABNORMAL LOW (ref >90)
GFR calc non Af Amer: 66 mL/min — ABNORMAL LOW (ref >90)
Potassium: 3.4 mEq/L — ABNORMAL LOW (ref 3.5–5.1)
Sodium: 142 mEq/L (ref 135–145)

## 2012-02-18 LAB — PROTIME-INR
INR: 2.44 — ABNORMAL HIGH (ref 0.00–1.49)
Prothrombin Time: 25.4 seconds — ABNORMAL HIGH (ref 11.6–15.2)

## 2012-02-18 LAB — CBC
MCHC: 32.5 g/dL (ref 30.0–36.0)
Platelets: 206 10*3/uL (ref 150–400)
RDW: 21.5 % — ABNORMAL HIGH (ref 11.5–15.5)
WBC: 5.9 10*3/uL (ref 4.0–10.5)

## 2012-02-18 LAB — HEPARIN LEVEL (UNFRACTIONATED): Heparin Unfractionated: 0.32 IU/mL (ref 0.30–0.70)

## 2012-02-18 LAB — MAGNESIUM: Magnesium: 1.7 mg/dL (ref 1.5–2.5)

## 2012-02-18 MED ORDER — WARFARIN SODIUM 5 MG PO TABS: ORAL_TABLET | ORAL | Status: DC

## 2012-02-18 MED ORDER — METOPROLOL TARTRATE 50 MG PO TABS: 75.0000 mg | ORAL_TABLET | Freq: Two times a day (BID) | ORAL | Status: DC

## 2012-02-18 MED ORDER — WARFARIN SODIUM 5 MG PO TABS: 5.0000 mg | ORAL_TABLET | Freq: Every day | ORAL | Status: DC

## 2012-02-18 MED ORDER — WARFARIN SODIUM 5 MG PO TABS
5.0000 mg | ORAL_TABLET | Freq: Once | ORAL | Status: AC
  Administered 2012-02-18: 5 mg via ORAL
  Filled 2012-02-18: qty 1

## 2012-02-18 MED ORDER — WARFARIN SODIUM 4 MG PO TABS
4.0000 mg | ORAL_TABLET | Freq: Once | ORAL | Status: DC
  Filled 2012-02-18: qty 1

## 2012-02-18 MED ORDER — FLUTICASONE PROPIONATE 50 MCG/ACT NA SUSP: 2.0000 | Freq: Every day | NASAL | Status: DC

## 2012-02-18 MED ORDER — WARFARIN SODIUM 4 MG PO TABS: 4.0000 mg | ORAL_TABLET | Freq: Once | ORAL | Status: DC

## 2012-02-18 MED ORDER — MAGNESIUM SULFATE 40 MG/ML IJ SOLN
2.0000 g | Freq: Once | INTRAMUSCULAR | Status: AC
  Administered 2012-02-18: 2 g via INTRAVENOUS
  Filled 2012-02-18: qty 50

## 2012-02-18 NOTE — Progress Notes (Signed)
Staples out Friday. Can go home when medicine clears

## 2012-02-18 NOTE — Discharge Summary (Signed)
Physician Discharge Summary  Alfred Dennis AVW:[id number] DOB: 04-16-1923 DOA: 02/03/2012  PCP: No primary provider on file.  Admit date: 02/03/2012 Discharge date: 02/18/2012  Time spent: 40 minutes  Recommendations for Outpatient Follow-up:  1. Get INR 02/22/12-report to Dr. [facility] 2. Cmet/Cbc in 1 week 3. Out-patient follow up with Dr. Truett Perna for transverse colon cancer, Dr.Sherill 12/23 at 1:30PM 4. Follow up Dr. Luisa Hart 1-2 weeks 5. Out-patient pt/ot/Rn/Aide-needs med monitoring  Discharge Diagnoses:  Principal Problem:  *CHF (congestive heart failure) Active Problems:  Chronic a-fib  ARF (acute renal failure)  HTN (hypertension)  S/P MVR (mitral valve replacement)  GIB (gastrointestinal bleeding)  Iron deficiency anemia  Acute on chronic systolic CHF (congestive heart failure)  Non-sustained ventricular tachycardia  Carcinoma of colon  Benign neoplasm of colon  Gastritis  Colonic mass  Hypotension   Discharge Condition: Good  Diet recommendation: CHF diet.  Filed Weights   02/16/12 1448 02/17/12 0459 02/18/12 0542  Weight: 150 lb 12.8 oz (68.402 kg) 149 lb 0.5 oz (67.6 kg) 152 lb 9.6 oz (69.219 kg)    History of present illness:  Alfred Dennis is a 78 y.o. Male with h/o afib, mechanical MVR st Jude's valve on coumadin, CHF last EF 40-45% who presents with Shortness of breath, He states that for the past 3 weeks he has had SOB with minimal exertion that has worsened.  In the ED CXR showed Cardiomegaly, pulmonary vascular congestion and interstitial pulmonary edema compatible with mild CHF, BNP elevated and labs showed hgb of 6.7, Rectal exam per EDP was guaiac +, brown stool, INR was 2.96.  Subsequent evaluation led to the detection of colon mass by colonoscopy which turned out to adenocarcinoma, and underwent partial colectomy Please see below for express details.   Dennis Course:  Acute on chronic systolic CHF  -EF-25-30   -Continue metoprolol, increase to 75mg  BID, PO lasix  -Appreciate cardiology recommendations  -Daily weights, strict I.'s and O.'s  -neg 5.1L for the admission  -TSH 3.922  - Cards following, appreciate input  Adenocarcinoma of Transverse colon, T3N1  -Noted on colonoscopy  -s/p R-hemicolectomy 12/4 per [facility], did well post op, tolerating regular diet now-staples to be discontinued 02/19/2012. Has not passed a stool since the past 4-5 days.  -Made Oncology FU with Dr.Sherill 12/23 at 1:30PM  NSVTach : 12/8 and 12/5, continue BB, monitor electrolytes  -due to cardiomyopathy  -stable currently  Blood loss Anemia/Iron deficiency  Recurrent Gi bleed off and on, on admission, then post op 12/6, appears to have finally resolved now  - Resumed IV heparin 12/8 after discussion with surgery  -Transfused 2 units PRBCs 11/27--and 2 units PRBC 12/6, Hb stable since  -continue iron supplementation  Chronic atrial fibrillation/history of St. Jude mitral valve replacement, Chads2Vasc2~3  -Metoprolol dose increased to 75mg  BID  -Rate was slightly higher this am, Rate controlled currently  - continue IV heparin with coumadin, goal INR 2.-3.5 Would shoot for a lower goal given his h/o GI bleeding  Impaired glucose tolerance  -review as out-patient   Procedures:  Colonoscopy and EGD 02/08/2012  Laparoscopic partial colectomy for transverse colon cancer and sessile cecal polyps 02/10/12  Consultations:  Surgery-Dr. Luisa Hart  GI  Cards  Discharge Exam: Filed Vitals:   02/17/12 2100 02/17/12 2313 02/18/12 0542 02/18/12 0551  BP: 115/60 118/51 124/63   Pulse:  68 58 66  Temp:   98.4 F (36.9 C)   TempSrc:   Oral   Resp:  18 18   Height:      Weight:   152 lb 9.6 oz (69.219 kg)   SpO2:  99% 98%   General: Pt is alert, follows commands appropriately, not in acute distress HEENT: No icterus, No thrush, No neck mass, [place]/AT Cardiovascular: RRR, S1/S2, metallic click, no rubs, no gallops  Respiratory: CTA bilaterally, no wheezing, no crackles, no rhonchi    Discharge Instructions  Discharge Orders    Future Appointments: Provider: Department: Dept Phone: Center:   02/29/2012 1:30 PM [facility] [phone] None   02/29/2012 2:00 PM Ladene Artist, [facility] [phone] None     Future Orders Please Complete By Expires   Diet - low sodium heart healthy      Increase activity slowly      Home Health      Questions: Responses:   To provide the following care/treatments PT    OT    RN    Home Health Aide    Social work   Face-to-face encounter      Scheduling Instructions:   Needs INR on Monday   Comments:   Alfred Dennis certify that this patient is under my care and that I, or a nurse practitioner or physician's assistant working with me, had a face-to-face encounter that meets the physician face-to-face encounter requirements with this patient on 02/18/2012. The encounter with the patient was in whole, or in part for the following medical condition(s) which is the primary reason for home health care (List medical condition): CHF and Surgery   Questions: Responses:   The encounter with the patient was in whole, or in part, for the following medical condition, which is the primary reason for home health care Compelx Dennis course with surgery   I certify that, based on my findings, the following services are medically necessary home health services Nursing    Physical therapy   My clinical findings support the need for the above services Pain interferes with ambulation/mobility   Further, I certify that my clinical findings support that this patient is homebound due to: Pain interferes with ambulation/mobility   To provide the following care/treatments PT    OT    RN    Home Health Aide    Social work    Remove dressing in 72 hours          Medication  List     As of 02/18/2012  9:18 AM    STOP taking these medications         hydrALAZINE 25 MG tablet   Commonly known as: APRESOLINE      triamcinolone cream 0.1 %   Commonly known as: KENALOG      TAKE these medications         fluticasone 50 MCG/ACT nasal spray   Commonly known as: FLONASE   Place 2 sprays into the nose daily.      furosemide 20 MG tablet   Commonly known as: LASIX   Take 40 mg by mouth daily.      metoprolol succinate 50 MG 24 hr tablet   Commonly known as: TOPROL-XL   Take 75 mg by mouth daily. Take with or immediately following a meal.      multivitamin with minerals Tabs   Take 1 tablet by mouth daily.      omega-3 acid ethyl esters 1 G capsule   Commonly known as: LOVAZA  Take 1 g by mouth 3 (three) times daily.      simvastatin 10 MG tablet   Commonly known as: ZOCOR   Take 10 mg by mouth at bedtime.      warfarin 4 MG tablet   Commonly known as: COUMADIN   Take 1 tablet (4 mg total) by mouth one time only at 6 PM.           Follow-up Information    Follow up with Milas Hock, CMA. (she will call you with staple removal appointment time)    Contact information:   [address] [place] [address]       Follow up with [facility], MD. Schedule an appointment as soon as possible for a visit in 3 weeks.   Contact information:   [address] [place] [address] [phone]       Follow up with [facility] [place], MD. In 3 weeks. (Call to schedule an appointment. You may also call our office as needed if symptoms worsen)    Contact information:   [address] [place] [address] [phone]           The results of significant diagnostics from this hospitalization (including imaging, microbiology, ancillary and laboratory) are listed below for reference.    Significant Diagnostic Studies: Dg Chest 2 View  02/03/2012  *RADIOLOGY REPORT*  Clinical Data: Short of breath.   Weakness.  CHEST - 2 VIEW  Comparison: None.  Findings: Cardiomegaly.  Pulmonary vascular congestion.  Elevation of the left hemidiaphragm.  Left basilar atelectasis.  No focal consolidation.  Mitral valve replacement.  Interstitial pulmonary edema is present.  IMPRESSION: Cardiomegaly, pulmonary vascular congestion and interstitial pulmonary edema compatible with mild CHF.   Original Report Authenticated By: Andreas Newport, M.D.    Ct Chest W Contrast  02/09/2012  *RADIOLOGY REPORT*  Clinical Data:  Status post colonoscopy 02/08/2012 where a circumferential ulcerated mass was identified transverse colon.  CT CHEST, ABDOMEN AND PELVIS WITH CONTRAST  Technique:  Multidetector CT imaging of the chest, abdomen and pelvis was performed following the standard protocol during bolus administration of intravenous contrast.  Contrast: OMNIPAQUE IOHEXOL 300 MG/ML  SOLN  Comparison:  Plain films of the chest 02/03/2012.  CT CHEST  Findings:  There is no axillary, hilar or mediastinal lymphadenopathy.  Trace pleural fluid on the left is noted.  There is no right pleural effusion or pericardial effusion.  Massive cardiomegaly is identified.  The patient is status post CABG and mitral valve replacement.  No pulmonary nodule or mass is identified.  There is some mild dependent atelectasis.  No focal bony abnormality is identified.  IMPRESSION:  1.  Negative for metastatic or acute disease. 2.  Massive cardiomegaly.  CT ABDOMEN AND PELVIS  Findings:  The liver, adrenal glands, spleen, pancreas and kidneys all appear normal.  Multiple stones are seen within the gallbladder but there is no CT evidence of cholecystitis.  The patient has a left inguinal hernia containing a loop of descending colon.  No obstruction or other complicating feature is identified.  Scattered colonic diverticula without diverticulitis are noted.  Focal wall thickening is seen in the transverse colon just distal to the hepatic flexure which may  represent the patient's mass seen on colonoscopy. The walls of the rectosigmoid colon and cecum are thickened although these loops are under distended.  Stomach, small bowel and appendix are normal in appearance.  The prostate  gland is mildly enlarged.  Urinary bladder appears normal.  There is no lymphadenopathy or fluid.  No focal bony abnormality is identified.  IMPRESSION:  1.  Negative for acute or metastatic disease. 2.  Wall thickening in the transverse colon just distal to the hepatic flexure may represent the patient's known colon mass.  Wall thickening of the cecum and rectosigmoid colon is likely due to under distention.  3.  Mild enlargement of the prostate gland. 4.  Diverticulosis without diverticulitis. 5.  Left inguinal hernia contains a knuckle of colon without obstruction or other complicating feature. 6.  Gallstones without cholecystitis.   Original Report Authenticated By: Holley Dexter, M.D.    Ct Abdomen Pelvis W Contrast  02/09/2012  *RADIOLOGY REPORT*  Clinical Data:  Status post colonoscopy 02/08/2012 where a circumferential ulcerated mass was identified transverse colon.  CT CHEST, ABDOMEN AND PELVIS WITH CONTRAST  Technique:  Multidetector CT imaging of the chest, abdomen and pelvis was performed following the standard protocol during bolus administration of intravenous contrast.  Contrast: OMNIPAQUE IOHEXOL 300 MG/ML  SOLN  Comparison:  Plain films of the chest 02/03/2012.  CT CHEST  Findings:  There is no axillary, hilar or mediastinal lymphadenopathy.  Trace pleural fluid on the left is noted.  There is no right pleural effusion or pericardial effusion.  Massive cardiomegaly is identified.  The patient is status post CABG and mitral valve replacement.  No pulmonary nodule or mass is identified.  There is some mild dependent atelectasis.  No focal bony abnormality is identified.  IMPRESSION:  1.  Negative for metastatic or acute disease. 2.  Massive cardiomegaly.  CT ABDOMEN  AND PELVIS  Findings:  The liver, adrenal glands, spleen, pancreas and kidneys all appear normal.  Multiple stones are seen within the gallbladder but there is no CT evidence of cholecystitis.  The patient has a left inguinal hernia containing a loop of descending colon.  No obstruction or other complicating feature is identified.  Scattered colonic diverticula without diverticulitis are noted.  Focal wall thickening is seen in the transverse colon just distal to the hepatic flexure which may represent the patient's mass seen on colonoscopy. The walls of the rectosigmoid colon and cecum are thickened although these loops are under distended.  Stomach, small bowel and appendix are normal in appearance.  The prostate gland is mildly enlarged.  Urinary bladder appears normal.  There is no lymphadenopathy or fluid.  No focal bony abnormality is identified.  IMPRESSION:  1.  Negative for acute or metastatic disease. 2.  Wall thickening in the transverse colon just distal to the hepatic flexure may represent the patient's known colon mass.  Wall thickening of the cecum and rectosigmoid colon is likely due to under distention.  3.  Mild enlargement of the prostate gland. 4.  Diverticulosis without diverticulitis. 5.  Left inguinal hernia contains a knuckle of colon without obstruction or other complicating feature. 6.  Gallstones without cholecystitis.   Original Report Authenticated By: Holley Dexter, M.D.    Dg Chest Port 1 View  02/12/2012  *RADIOLOGY REPORT*  Clinical Data: PICC placement.  PORTABLE CHEST - 1 VIEW  Comparison: 02/03/2012.  Findings: Interval right PICC with its tip in the upper right atrium.  Stable enlargement of the cardiac silhouette and elevation of the left hemidiaphragm.  Stable post CABG changes and prosthetic heart valve.  Minimal linear scarring at the right lung base is unchanged.  IMPRESSION:  1.  Right PICC tip in the upper right  atrium.  If a position at the cavoatrial junction is  desired, this could be retracted 1.5 cm. 2.  Stable cardiomegaly, elevated left hemidiaphragm and minimal right basilar scarring.   Original Report Authenticated By: Beckie Salts, M.D.     Microbiology: Recent Results (from the past 240 hour(s))  SURGICAL PCR SCREEN     Status: Normal   Collection Time   02/10/12  5:14 AM      Component Value Range Status Comment   MRSA, PCR NEGATIVE  NEGATIVE Final    Staphylococcus aureus NEGATIVE  NEGATIVE Final      Labs: Basic Metabolic Panel:  Lab 02/18/12 1610 02/17/12 2352 02/17/12 0635 02/16/12 0615 02/15/12 0409 02/14/12 0420  NA 142 -- 142 140 139 141  K 3.4* -- 3.5 3.3* 3.7 4.3  CL 110 -- 109 108 107 112  CO2 24 -- 26 25 25 22   GLUCOSE 101* -- 107* 101* 100* 116*  BUN 22 -- 23 28* 24* 21  CREATININE 1.00 -- 1.12 1.01 0.96 0.92  CALCIUM 8.7 -- 8.8 8.6 8.5 8.7  MG -- 1.7 -- -- -- --  PHOS -- -- -- -- -- --   Liver Function Tests: No results found for this basename: AST:5,ALT:5,ALKPHOS:5,BILITOT:5,PROT:5,ALBUMIN:5 in the last 168 hours No results found for this basename: LIPASE:5,AMYLASE:5 in the last 168 hours No results found for this basename: AMMONIA:5 in the last 168 hours CBC:  Lab 02/18/12 0450 02/17/12 2250 02/17/12 0635 02/16/12 1841 02/16/12 0615  WBC 5.9 5.8 6.3 7.0 6.4  NEUTROABS -- -- -- -- --  HGB 9.4* 8.9* 9.8* 10.1* 9.9*  HCT 28.9* 27.6* 30.0* 31.6* 30.3*  MCV 83.0 82.9 82.9 82.7 82.6  PLT 206 196 187 214 187   Cardiac Enzymes: No results found for this basename: CKTOTAL:5,CKMB:5,CKMBINDEX:5,TROPONINI:5 in the last 168 hours BNP: BNP (last 3 results)  Basename 02/03/12 1113  PROBNP 3809.0*   CBG: No results found for this basename: GLUCAP:5 in the last 168 hours     Signed:  Rhetta Mura  [facility] 02/18/2012, 9:18 AM

## 2012-02-18 NOTE — Progress Notes (Addendum)
ANTICOAGULATION CONSULT NOTE - Follow Up Consult  Pharmacy Consult for Heparin and Coumadin Indication: Mechanical MVR, Afib  No Known Allergies  Patient Measurements: Height: 5\' 7"  (170.2 cm) Weight: 152 lb 9.6 oz (69.219 kg) (scale b) IBW/kg (Calculated) : 66.1  Heparin Dosing Weight: 67.6kg  Vital Signs: Temp: 98.4 F (36.9 C) (12/12 0542) Temp src: Oral (12/12 0542) BP: 124/63 mmHg (12/12 0542) Pulse Rate: 66  (12/12 0551)  Labs:  Basename 02/18/12 0450 02/17/12 2250 02/17/12 0635 02/16/12 0615  HGB 9.4* 8.9* -- --  HCT 28.9* 27.6* 30.0* --  PLT 206 196 187 --  APTT -- -- -- --  LABPROT 25.4* -- 22.8* 18.5*  INR 2.44* -- 2.11* 1.59*  HEPARINUNFRC 0.32 -- 0.41 0.40  CREATININE 1.00 -- 1.12 1.01  CKTOTAL -- -- -- --  CKMB -- -- -- --  TROPONINI -- -- -- --    Estimated Creatinine Clearance: 49.6 ml/min (by C-G formula based on Cr of 1).   Medications:  Heparin 1200 units/hr  Assessment: 88yom on heparin bridging to Coumadin for hx MVR (St Jude) and chronic Afib. INR (2.44) is trending up. Heparin level (0.32) is therapeutic (goal 0.3-0.5 with recent GI bleed) - Pt is planned to be discharged today on prior to admission regimen of 5mg  Coumadin Tues/Thurs/Sunday, 2.5mg  all other days. This regimen was verified by the PharmD Riki Rusk) at [facility] cardiology   Pt experienced some runs of Vtach last night (12/11) but no further episodes reported  - H/H and Plts stable - No significant bleeding reported  Goal of Therapy:  Heparin level 0.3-0.5 units/ml with recent GI bleed INR 2.5-3.5 with mechanical MVR Monitor platelets by anticoagulation protocol: Yes   Plan:  - I will order Coumadin 5mg  x 1 dose tonight at 1600 in case patient does not get discharged before then.  - Continue heparin drip at 1200units/hr until INR 2.5 or until patient discharged  Thank you, Franchot Erichsen, Pharm.D. Clinical Pharmacist   02/18/2012 8:53 AM

## 2012-02-18 NOTE — Progress Notes (Signed)
SUBJECTIVE:  Doing well no complants  OBJECTIVE:   Vitals:   Filed Vitals:   02/17/12 2100 02/17/12 2313 02/18/12 0542 02/18/12 0551  BP: 115/60 118/51 124/63   Pulse:  68 58 66  Temp:   98.4 F (36.9 C)   TempSrc:   Oral   Resp:  18 18   Height:      Weight:   69.219 kg (152 lb 9.6 oz)   SpO2:  99% 98%    I&O's:   Intake/Output Summary (Last 24 hours) at 02/18/12 1613 Last data filed at 02/18/12 1610  Gross per 24 hour  Intake    854 ml  Output    875 ml  Net    -21 ml   TELEMETRY: Reviewed telemetry pt in atrial fibirllation:     PHYSICAL EXAM General: Well developed, well nourished, in no acute distress Head: Eyes PERRLA, No xanthomas.   Normal cephalic and atramatic  Lungs:   Clear bilaterally to auscultation and percussion. Heart:   Irregularly irregular S1 S2 Pulses are 2+ & equal. Abdomen: Bowel sounds are positive, abdomen soft and non-tender without masses  Extremities:   No clubbing, cyanosis or edema.  DP +1 Neuro: Alert and oriented X 3. Psych:  Good affect, responds appropriately   LABS: Basic Metabolic Panel:  Basename 02/18/12 0450 02/17/12 2352 02/17/12 0635  NA 142 -- 142  K 3.4* -- 3.5  CL 110 -- 109  CO2 24 -- 26  GLUCOSE 101* -- 107*  BUN 22 -- 23  CREATININE 1.00 -- 1.12  CALCIUM 8.7 -- 8.8  MG -- 1.7 --  PHOS -- -- --   Liver Function Tests: No results found for this basename: AST:2,ALT:2,ALKPHOS:2,BILITOT:2,PROT:2,ALBUMIN:2 in the last 72 hours No results found for this basename: LIPASE:2,AMYLASE:2 in the last 72 hours CBC:  Basename 02/18/12 0450 02/17/12 2250  WBC 5.9 5.8  NEUTROABS -- --  HGB 9.4* 8.9*  HCT 28.9* 27.6*  MCV 83.0 82.9  PLT 206 196   Cardiac Enzymes: No results found for this basename: CKTOTAL:3,CKMB:3,CKMBINDEX:3,TROPONINI:3 in the last 72 hours BNP: No components found with this basename: POCBNP:3 D-Dimer: No results found for this basename: DDIMER:2 in the last 72 hours Hemoglobin A1C: No results  found for this basename: HGBA1C in the last 72 hours Fasting Lipid Panel: No results found for this basename: CHOL,HDL,LDLCALC,TRIG,CHOLHDL,LDLDIRECT in the last 72 hours Thyroid Function Tests: No results found for this basename: TSH,T4TOTAL,FREET3,T3FREE,THYROIDAB in the last 72 hours Anemia Panel: No results found for this basename: VITAMINB12,FOLATE,FERRITIN,TIBC,IRON,RETICCTPCT in the last 72 hours Coag Panel:   Lab Results  Component Value Date   INR 2.44* 02/18/2012   INR 2.11* 02/17/2012   INR 1.59* 02/16/2012    RADIOLOGY: Dg Chest 2 View  02/03/2012  *RADIOLOGY REPORT*  Clinical Data: Short of breath.  Weakness.  CHEST - 2 VIEW  Comparison: None.  Findings: Cardiomegaly.  Pulmonary vascular congestion.  Elevation of the left hemidiaphragm.  Left basilar atelectasis.  No focal consolidation.  Mitral valve replacement.  Interstitial pulmonary edema is present.  IMPRESSION: Cardiomegaly, pulmonary vascular congestion and interstitial pulmonary edema compatible with mild CHF.   Original Report Authenticated By: Andreas Newport, M.D.    Ct Chest W Contrast  02/09/2012  *RADIOLOGY REPORT*  Clinical Data:  Status post colonoscopy 02/08/2012 where a circumferential ulcerated mass was identified transverse colon.  CT CHEST, ABDOMEN AND PELVIS WITH CONTRAST  Technique:  Multidetector CT imaging of the chest, abdomen and pelvis was performed following the standard  protocol during bolus administration of intravenous contrast.  Contrast: OMNIPAQUE IOHEXOL 300 MG/ML  SOLN  Comparison:  Plain films of the chest 02/03/2012.  CT CHEST  Findings:  There is no axillary, hilar or mediastinal lymphadenopathy.  Trace pleural fluid on the left is noted.  There is no right pleural effusion or pericardial effusion.  Massive cardiomegaly is identified.  The patient is status post CABG and mitral valve replacement.  No pulmonary nodule or mass is identified.  There is some mild dependent atelectasis.  No  focal bony abnormality is identified.  IMPRESSION:  1.  Negative for metastatic or acute disease. 2.  Massive cardiomegaly.  CT ABDOMEN AND PELVIS  Findings:  The liver, adrenal glands, spleen, pancreas and kidneys all appear normal.  Multiple stones are seen within the gallbladder but there is no CT evidence of cholecystitis.  The patient has a left inguinal hernia containing a loop of descending colon.  No obstruction or other complicating feature is identified.  Scattered colonic diverticula without diverticulitis are noted.  Focal wall thickening is seen in the transverse colon just distal to the hepatic flexure which may represent the patient's mass seen on colonoscopy. The walls of the rectosigmoid colon and cecum are thickened although these loops are under distended.  Stomach, small bowel and appendix are normal in appearance.  The prostate gland is mildly enlarged.  Urinary bladder appears normal.  There is no lymphadenopathy or fluid.  No focal bony abnormality is identified.  IMPRESSION:  1.  Negative for acute or metastatic disease. 2.  Wall thickening in the transverse colon just distal to the hepatic flexure may represent the patient's known colon mass.  Wall thickening of the cecum and rectosigmoid colon is likely due to under distention.  3.  Mild enlargement of the prostate gland. 4.  Diverticulosis without diverticulitis. 5.  Left inguinal hernia contains a knuckle of colon without obstruction or other complicating feature. 6.  Gallstones without cholecystitis.   Original Report Authenticated By: Holley Dexter, M.D.    Ct Abdomen Pelvis W Contrast  02/09/2012  *RADIOLOGY REPORT*  Clinical Data:  Status post colonoscopy 02/08/2012 where a circumferential ulcerated mass was identified transverse colon.  CT CHEST, ABDOMEN AND PELVIS WITH CONTRAST  Technique:  Multidetector CT imaging of the chest, abdomen and pelvis was performed following the standard protocol during bolus administration of  intravenous contrast.  Contrast: OMNIPAQUE IOHEXOL 300 MG/ML  SOLN  Comparison:  Plain films of the chest 02/03/2012.  CT CHEST  Findings:  There is no axillary, hilar or mediastinal lymphadenopathy.  Trace pleural fluid on the left is noted.  There is no right pleural effusion or pericardial effusion.  Massive cardiomegaly is identified.  The patient is status post CABG and mitral valve replacement.  No pulmonary nodule or mass is identified.  There is some mild dependent atelectasis.  No focal bony abnormality is identified.  IMPRESSION:  1.  Negative for metastatic or acute disease. 2.  Massive cardiomegaly.  CT ABDOMEN AND PELVIS  Findings:  The liver, adrenal glands, spleen, pancreas and kidneys all appear normal.  Multiple stones are seen within the gallbladder but there is no CT evidence of cholecystitis.  The patient has a left inguinal hernia containing a loop of descending colon.  No obstruction or other complicating feature is identified.  Scattered colonic diverticula without diverticulitis are noted.  Focal wall thickening is seen in the transverse colon just distal to the hepatic flexure which may represent the patient's  mass seen on colonoscopy. The walls of the rectosigmoid colon and cecum are thickened although these loops are under distended.  Stomach, small bowel and appendix are normal in appearance.  The prostate gland is mildly enlarged.  Urinary bladder appears normal.  There is no lymphadenopathy or fluid.  No focal bony abnormality is identified.  IMPRESSION:  1.  Negative for acute or metastatic disease. 2.  Wall thickening in the transverse colon just distal to the hepatic flexure may represent the patient's known colon mass.  Wall thickening of the cecum and rectosigmoid colon is likely due to under distention.  3.  Mild enlargement of the prostate gland. 4.  Diverticulosis without diverticulitis. 5.  Left inguinal hernia contains a knuckle of colon without obstruction or other  complicating feature. 6.  Gallstones without cholecystitis.   Original Report Authenticated By: Holley Dexter, M.D.    Dg Chest Port 1 View  02/12/2012  *RADIOLOGY REPORT*  Clinical Data: PICC placement.  PORTABLE CHEST - 1 VIEW  Comparison: 02/03/2012.  Findings: Interval right PICC with its tip in the upper right atrium.  Stable enlargement of the cardiac silhouette and elevation of the left hemidiaphragm.  Stable post CABG changes and prosthetic heart valve.  Minimal linear scarring at the right lung base is unchanged.  IMPRESSION:  1.  Right PICC tip in the upper right atrium.  If a position at the cavoatrial junction is desired, this could be retracted 1.5 cm. 2.  Stable cardiomegaly, elevated left hemidiaphragm and minimal right basilar scarring.   Original Report Authenticated By: Beckie Salts, M.D.     ASSESSMENT AND PLAN:  Patient Active Hospital Problem List: CHF (congestive heart failure) (02/03/2012)  Chronic a-fib (02/03/2012)  S/P MVR (mitral valve replacement) (02/03/2012)  GIB (gastrointestinal bleeding) (02/03/2012)  Non-sustained ventricular tachycardia (02/06/2012)  Doing well.  Beta blocker resumed for rate control/cardiomyoapty and VTNS  On coumadin and INR 2.44 this am - ok for d/c home on home dose of coumadin Followup in coumadin clinic on Monday     Quintella Reichert, MD  02/18/2012  4:13 PM

## 2012-02-18 NOTE — Progress Notes (Signed)
Physical Therapy Treatment Patient Details Name: Alfred Dennis MRN: [id number] DOB: Jun 05, 1923 Today's Date: 02/18/2012 Time: 4098-1191 PT Time Calculation (min): 26 min  PT Assessment / Plan / Recommendation Comments on Treatment Session  [name] is to d/c home today. Our session focused on reinforcement of education provided yesterday concerning safety with transfers, gait and use of RW as well as family education. Daughter will be present for the first week but will be able to check on pt and his wife often. Family understands pt not to get into tub without assist and is not safe to be showering in standing at this point due to balance deficits. Plan is to have home health therapists progress him back to this activity. For now they are agreeable to sponge bathing.  Family and pt able to verbalize and demonstrate safe transfer techniques and assistance level and are aware he needs supervision for any gait and OOB mobility. Provided energy conservation handout to daughter and went over key points concerning fatigue and ADLs. Family deny further concerns for home mobility and will have HHPT f/u.     Follow Up Recommendations  Home health PT;Supervision/Assistance - 24 hour     Does the patient have the potential to tolerate intense rehabilitation     Barriers to Discharge        Equipment Recommendations  None recommended by PT    Recommendations for Other Services    Frequency Min 3X/week   Plan Discharge plan remains appropriate;Frequency remains appropriate    Precautions / Restrictions Precautions Precautions: Fall Restrictions Weight Bearing Restrictions: No       Mobility  Bed Mobility Bed Mobility: Rolling Left;Left Sidelying to Sit Rolling Left: 5: Supervision Left Sidelying to Sit: 4: Min assist;HOB flat Details for Bed Mobility Assistance: sequencing cues for pt and family member education for assistance level specifically min tactile to bring trunk upright  from bed Transfers Transfers: Sit to Stand;Stand to Sit Sit to Stand: 5: Supervision;From bed;With armrests;From chair/3-in-1;With upper extremity assist Stand to Sit: 5: Supervision;To chair/3-in-1;With armrests;With upper extremity assist Details for Transfer Assistance: verbal cues for safe technique and hand placement, daughter listening and taking notes throughout session; she was able to verbalize back correct sequencing with RW Ambulation/Gait Ambulation/Gait Assistance: 4: Min guard Ambulation Distance (Feet): 30 Feet Assistive device: Rolling walker Ambulation/Gait Assistance Details: cues for tall posture and positioning in RW, good carry over from yesterday but still needing closer gaurding with turns (slight posterior falter) Gait Pattern: Trunk flexed;Shuffle Stairs: Yes Stairs Assistance: 4: Min Editor, commissioning Details (indicate cue type and reason): simulated small step with HHA and single limb stance, pt needing HHA and min tactile assist around his trunk; daughter also performed with cues for safest hand placement and able to demonstrate proper assistance level Stair Management Technique: No rails Number of Stairs: 1  (simulated)      PT Goals Acute Rehab PT Goals PT Goal: Supine/Side to Sit - Progress: Progressing toward goal PT Goal: Sit to Stand - Progress: Progressing toward goal PT Goal: Stand to Sit - Progress: Progressing toward goal PT Goal: Ambulate - Progress: Progressing toward goal PT Goal: Up/Down Stairs - Progress: Progressing toward goal  Visit Information  Last PT Received On: 02/18/12 Assistance Needed: +1    Subjective Data  Subjective: Im going home.  Patient Stated Goal: home today   Cognition  Overall Cognitive Status: Appears within functional limits for tasks assessed/performed Arousal/Alertness: Awake/alert Orientation Level: Appears intact for tasks assessed Behavior  During Session: [facility] for tasks performed    Balance      End of Session PT - End of Session Equipment Utilized During Treatment: Gait belt Activity Tolerance: Patient tolerated treatment well Patient left: in chair;with call bell/phone within reach;with family/visitor present Nurse Communication: Mobility status   GP     [facility] HELEN 02/18/2012, 1:16 PM

## 2012-02-18 NOTE — Progress Notes (Signed)
Pt. had a run of 8 VTach. BP 118/51 HR 68 RR 18 O2sat 99% at room air; asymptomatic, denies any chest pain, not in respiratory distress. Dr. Merdis Delay notified. Will continue monitoring pt.

## 2012-02-18 NOTE — Progress Notes (Signed)
Patient ID: Alfred Dennis, male   DOB: [birth year], 78 y.o.   MRN: [id number] 8 Days Post-Op  Subjective: Resting quietly in bed this morning, wife and daughter at bedsisde, no c/o offered. No further episodes of VT reported. (patient had 8 beat run last pm.)  Objective: Vital signs in last 24 hours: Temp:  [97.9 F (36.6 C)-98.4 F (36.9 C)] 98.4 F (36.9 C) (12/12 0542) Pulse Rate:  [58-82] 66  (12/12 0551) Resp:  [18-19] 18  (12/12 0542) BP: (97-124)/(44-63) 124/63 mmHg (12/12 0542) SpO2:  [97 %-99 %] 98 % (12/12 0542) Weight:  [152 lb 9.6 oz (69.219 kg)] 152 lb 9.6 oz (69.219 kg) (12/12 0542) Last BM Date: 02/15/12  Intake/Output from previous day: 12/11 0701 - 12/12 0700 In: 974 [P.O.:600; I.V.:374] Out: 1075 [Urine:1075] Intake/Output this shift:   General appearance: NAD, resting quietly Chest: CTA Cardiac: regularly irregular, No M/R/G Abdomen: incision C/D/I staples intact (plan for removal on Friday) Extremities: some small amount of dependent edema in LL, warm to touch, + pulses, no tenderness. VSS, afebrile Labs: WBC wnl, H&H and Plts all improved, slight hypokalemia, BUN and Creatine improved. INR: 2.44  Lab Results:   Basename 02/18/12 0450 02/17/12 2250  WBC 5.9 5.8  HGB 9.4* 8.9*  HCT 28.9* 27.6*  PLT 206 196   BMET  Basename 02/18/12 0450 02/17/12 0635  NA 142 142  K 3.4* 3.5  CL 110 109  CO2 24 26  GLUCOSE 101* 107*  BUN 22 23  CREATININE 1.00 1.12  CALCIUM 8.7 8.8   PT/INR  Basename 02/18/12 0450 02/17/12 0635  LABPROT 25.4* 22.8*  INR 2.44* 2.11*   ABG No results found for this basename: PHART:2,PCO2:2,PO2:2,HCO3:2 in the last 72 hours  Studies/Results: No results found.  Anti-infectives: Anti-infectives     Start     Dose/Rate Route Frequency Ordered Stop   02/10/12 1130   metroNIDAZOLE (FLAGYL) IVPB 500 mg  Status:  Discontinued        500 mg 100 mL/hr over 60 Minutes Intravenous To Surgery 02/10/12 1129 02/10/12 1604          Assessment/Plan: s/p Procedure(s) (LRB) with comments: LAPAROSCOPIC PARTIAL COLECTOMY (N/A) Miralax daily. Stable from our standpoint ok for discharge when cleared by medicine. He will Follow up with Dr. Donell Beers in 3 weeks time. Staples out Friday    LOS: 15 days    Golda Acre [facility] Surgery Pager # [phone]  02/18/2012

## 2012-02-19 ENCOUNTER — Telehealth: Payer: Self-pay | Admitting: *Deleted

## 2012-02-19 DIAGNOSIS — I4891 Unspecified atrial fibrillation: Secondary | ICD-10-CM | POA: Diagnosis not present

## 2012-02-19 DIAGNOSIS — D62 Acute posthemorrhagic anemia: Secondary | ICD-10-CM | POA: Diagnosis not present

## 2012-02-19 DIAGNOSIS — M6281 Muscle weakness (generalized): Secondary | ICD-10-CM | POA: Diagnosis not present

## 2012-02-19 DIAGNOSIS — Z483 Aftercare following surgery for neoplasm: Secondary | ICD-10-CM | POA: Diagnosis not present

## 2012-02-19 DIAGNOSIS — I509 Heart failure, unspecified: Secondary | ICD-10-CM | POA: Diagnosis not present

## 2012-02-19 DIAGNOSIS — C184 Malignant neoplasm of transverse colon: Secondary | ICD-10-CM | POA: Diagnosis not present

## 2012-02-19 NOTE — Telephone Encounter (Signed)
Spoke with patient's daughter, Clydie Braun, by phone and confirmed appointment for 02/29/12.  Contact names of self and MD were given along with phone numbers for [facility].  She verbalized understanding and was without questions.

## 2012-02-22 ENCOUNTER — Encounter (INDEPENDENT_AMBULATORY_CARE_PROVIDER_SITE_OTHER): Payer: Medicare Other

## 2012-02-22 DIAGNOSIS — Z954 Presence of other heart-valve replacement: Secondary | ICD-10-CM | POA: Diagnosis not present

## 2012-02-22 DIAGNOSIS — M6281 Muscle weakness (generalized): Secondary | ICD-10-CM | POA: Diagnosis not present

## 2012-02-22 DIAGNOSIS — Z483 Aftercare following surgery for neoplasm: Secondary | ICD-10-CM | POA: Diagnosis not present

## 2012-02-22 DIAGNOSIS — I509 Heart failure, unspecified: Secondary | ICD-10-CM | POA: Diagnosis not present

## 2012-02-22 DIAGNOSIS — D62 Acute posthemorrhagic anemia: Secondary | ICD-10-CM | POA: Diagnosis not present

## 2012-02-22 DIAGNOSIS — C184 Malignant neoplasm of transverse colon: Secondary | ICD-10-CM | POA: Diagnosis not present

## 2012-02-22 DIAGNOSIS — I4891 Unspecified atrial fibrillation: Secondary | ICD-10-CM | POA: Diagnosis not present

## 2012-02-23 ENCOUNTER — Encounter (INDEPENDENT_AMBULATORY_CARE_PROVIDER_SITE_OTHER): Payer: Medicare Other

## 2012-02-23 DIAGNOSIS — Z483 Aftercare following surgery for neoplasm: Secondary | ICD-10-CM | POA: Diagnosis not present

## 2012-02-23 DIAGNOSIS — C184 Malignant neoplasm of transverse colon: Secondary | ICD-10-CM | POA: Diagnosis not present

## 2012-02-23 DIAGNOSIS — D62 Acute posthemorrhagic anemia: Secondary | ICD-10-CM | POA: Diagnosis not present

## 2012-02-23 DIAGNOSIS — I4891 Unspecified atrial fibrillation: Secondary | ICD-10-CM | POA: Diagnosis not present

## 2012-02-23 DIAGNOSIS — I509 Heart failure, unspecified: Secondary | ICD-10-CM | POA: Diagnosis not present

## 2012-02-23 DIAGNOSIS — M6281 Muscle weakness (generalized): Secondary | ICD-10-CM | POA: Diagnosis not present

## 2012-02-24 DIAGNOSIS — D62 Acute posthemorrhagic anemia: Secondary | ICD-10-CM | POA: Diagnosis not present

## 2012-02-24 DIAGNOSIS — I4891 Unspecified atrial fibrillation: Secondary | ICD-10-CM | POA: Diagnosis not present

## 2012-02-24 DIAGNOSIS — I509 Heart failure, unspecified: Secondary | ICD-10-CM | POA: Diagnosis not present

## 2012-02-24 DIAGNOSIS — Z483 Aftercare following surgery for neoplasm: Secondary | ICD-10-CM | POA: Diagnosis not present

## 2012-02-24 DIAGNOSIS — C184 Malignant neoplasm of transverse colon: Secondary | ICD-10-CM | POA: Diagnosis not present

## 2012-02-24 DIAGNOSIS — M6281 Muscle weakness (generalized): Secondary | ICD-10-CM | POA: Diagnosis not present

## 2012-02-25 DIAGNOSIS — I4891 Unspecified atrial fibrillation: Secondary | ICD-10-CM | POA: Diagnosis not present

## 2012-02-25 DIAGNOSIS — D62 Acute posthemorrhagic anemia: Secondary | ICD-10-CM | POA: Diagnosis not present

## 2012-02-25 DIAGNOSIS — I509 Heart failure, unspecified: Secondary | ICD-10-CM | POA: Diagnosis not present

## 2012-02-25 DIAGNOSIS — Z483 Aftercare following surgery for neoplasm: Secondary | ICD-10-CM | POA: Diagnosis not present

## 2012-02-25 DIAGNOSIS — C184 Malignant neoplasm of transverse colon: Secondary | ICD-10-CM | POA: Diagnosis not present

## 2012-02-25 DIAGNOSIS — M6281 Muscle weakness (generalized): Secondary | ICD-10-CM | POA: Diagnosis not present

## 2012-02-26 DIAGNOSIS — C184 Malignant neoplasm of transverse colon: Secondary | ICD-10-CM | POA: Diagnosis not present

## 2012-02-26 DIAGNOSIS — D62 Acute posthemorrhagic anemia: Secondary | ICD-10-CM | POA: Diagnosis not present

## 2012-02-26 DIAGNOSIS — I509 Heart failure, unspecified: Secondary | ICD-10-CM | POA: Diagnosis not present

## 2012-02-26 DIAGNOSIS — I4891 Unspecified atrial fibrillation: Secondary | ICD-10-CM | POA: Diagnosis not present

## 2012-02-26 DIAGNOSIS — M6281 Muscle weakness (generalized): Secondary | ICD-10-CM | POA: Diagnosis not present

## 2012-02-26 DIAGNOSIS — Z483 Aftercare following surgery for neoplasm: Secondary | ICD-10-CM | POA: Diagnosis not present

## 2012-02-29 ENCOUNTER — Ambulatory Visit (HOSPITAL_BASED_OUTPATIENT_CLINIC_OR_DEPARTMENT_OTHER): Payer: Medicare Other | Admitting: Oncology

## 2012-02-29 ENCOUNTER — Encounter: Payer: Self-pay | Admitting: Oncology

## 2012-02-29 ENCOUNTER — Telehealth: Payer: Self-pay | Admitting: Oncology

## 2012-02-29 ENCOUNTER — Ambulatory Visit: Payer: Medicare Other

## 2012-02-29 VITALS — BP 128/61 | HR 79 | Temp 97.8°F | Resp 20 | Ht 64.5 in | Wt 153.4 lb

## 2012-02-29 DIAGNOSIS — Z954 Presence of other heart-valve replacement: Secondary | ICD-10-CM | POA: Diagnosis not present

## 2012-02-29 DIAGNOSIS — D126 Benign neoplasm of colon, unspecified: Secondary | ICD-10-CM

## 2012-02-29 DIAGNOSIS — C189 Malignant neoplasm of colon, unspecified: Secondary | ICD-10-CM

## 2012-02-29 DIAGNOSIS — D509 Iron deficiency anemia, unspecified: Secondary | ICD-10-CM | POA: Diagnosis not present

## 2012-02-29 DIAGNOSIS — I4891 Unspecified atrial fibrillation: Secondary | ICD-10-CM | POA: Diagnosis not present

## 2012-02-29 DIAGNOSIS — Z7901 Long term (current) use of anticoagulants: Secondary | ICD-10-CM | POA: Diagnosis not present

## 2012-02-29 DIAGNOSIS — C184 Malignant neoplasm of transverse colon: Secondary | ICD-10-CM | POA: Diagnosis not present

## 2012-02-29 NOTE — Progress Notes (Signed)
Met with patient, his wife and daughter.  Explained role of nurse navigator.  Information given to patient on colorectal cancer along with cancer center resources.  Patient declined need for any special services at this time.  His daughter confirmed.  He told the physician he was not interested in taking any treatment.  This RN reinforced to the patient and his daughter that if he changes his mind in next couple of weeks, to please call.  He was reminded to have a colonoscopy 1 year from the last.  Patient and his daughter verbalized comprehension, were without questions, and expressed appreciation.  Dr. Truett Perna to see patient in 4 months.

## 2012-02-29 NOTE — Progress Notes (Signed)
[facility] New Patient Consult   Referring MD: Jamine Highfill [place] 78 y.o.  September 16, 1923    Reason for Referral: Colon cancer     HPI: He is admitted with shortness of breath on 02/03/2012. He was diagnosed with congestive heart failure and severe anemia. Dr. Leone Payor was consulted and a colonoscopy confirmed a mass in the transverse colon. A biopsy confirmed adenocarcinoma. Dr. Donell Beers was consult and he was taken the operating room on 02/10/2012. An extended right colectomy was performed. The pathology revealed 3 tubular adenomas and a moderately differentiated adenocarcinoma of the right colon (T3 N1). The resection margins were negative. Lymphovascular and perineural invasion were noted.  He was transfused with packed blood cells for the severe anemia. He reports feeling better.  Past Medical History  Diagnosis Date  . Hypertension     microcytic anemia   02/03/2012        . CHF (congestive heart failure)     mild/note 02/03/2012  . Arthritis     "left foot; fingers" (02/03/2012)  . Dysrhythmia     Past Surgical History  Procedure Date  . Inguinal hernia repair     right  . Cataract extraction w/ intraocular lens  implant, bilateral ~ 2011  . Valve replacement     Dr. Mayford Knife [facility]  .  mitral valve replacement        . Coronary artery bypass graft     3x bypass  . Esophagogastroduodenoscopy 02/08/2012    Procedure: ESOPHAGOGASTRODUODENOSCOPY (EGD);  Surgeon: Iva Boop, MD;  Location: [facility] ENDOSCOPY;  Service: Endoscopy;  Laterality: N/A;  . Colonoscopy 02/08/2012    Procedure: COLONOSCOPY;  Surgeon: Iva Boop, MD;  Location: [facility] ENDOSCOPY;  Service: Endoscopy;  Laterality: N/A;  . Laparoscopic partial colectomy 02/10/2012    Procedure: LAPAROSCOPIC PARTIAL COLECTOMY;  Surgeon: Almond Lint, MD;  Location: [facility] OR;  Service: General;  Laterality: N/A;     family history: A son had prostate cancer at age 63. No other family history  of cancer.   Current outpatient prescriptions:furosemide (LASIX) 20 MG tablet, Take 40 mg by mouth daily., Disp: , Rfl: ;  metoprolol tartrate (LOPRESSOR) 50 MG tablet, Take 1.5 tablets (75 mg total) by mouth 2 (two) times daily., Disp: 90 tablet, Rfl: 0;  Multiple Vitamin (MULTIVITAMIN WITH MINERALS) TABS, Take 1 tablet by mouth daily., Disp: , Rfl: ;  omega-3 acid ethyl esters (LOVAZA) 1 G capsule, Take 1 g by mouth 3 (three) times daily., Disp: , Rfl:  simvastatin (ZOCOR) 10 MG tablet, Take 10 mg by mouth at bedtime., Disp: , Rfl: ;  warfarin (COUMADIN) 5 MG tablet, Take 5 mg by mouth daily. Take 2.5 mg on M-W-F-Sat Coumadin clinic per Dr. Armanda Magic, Disp: , Rfl:   Allergies: No Known Allergies  Social History: He lives in [place]. He is retired from working in [employer]. He does not use tobacco or alcohol. He was transfused packed red blood cells during the recent hospital admission. No risk factors for HIV or hepatitis.    History  Smoking status  . Former Smoker -- 1.0 packs/day for 20 years  . Types: Cigarettes  . Quit date: 03/09/1980  Smokeless tobacco  . Never Used    Comment: 02/03/2012 "quit smoking in my 30's"     ROS:   Positives include: the dyspnea on hospital admission November 2013   A complete ROS was otherwise negative.  Physical Exam:  Blood pressure 128/61, pulse  79, temperature 97.8 F (36.6 C), temperature source Oral, resp. rate 20, height 5' 4.5" (1.638 m), weight 153 lb 6.4 oz (69.582 kg).  HEENT:  multiple missing teeth, oropharynx without visible mass, neck without mass Lungs:  decreased breath sounds with inspiratory rhonchi at the left lower posterior chest, no respiratory distress  Cardiac:  irregular  Abdomen:  No hepatosplenomegaly, healed surgical incisions, no mass, nontender,? Left inguinal hernia GU:  testes without mass   Vascular:  1-2+ edema to low leg bilaterally  Lymph nodes:  no cervical, supraclavicular, axillary, or  inguinal nodes  Neurologic:  alert and oriented, the motor exam appears grossly intact in the upper and lower extremities   LAB:  CBC  Lab Results  Component Value Date   WBC 5.9 02/18/2012   HGB 9.4* 02/18/2012   HCT 28.9* 02/18/2012   MCV 83.0 02/18/2012   PLT 206 02/18/2012     CMP      Component Value Date/Time   NA 142 02/18/2012 0450   K 3.4* 02/18/2012 0450   CL 110 02/18/2012 0450   CO2 24 02/18/2012 0450   GLUCOSE 101* 02/18/2012 0450   BUN 22 02/18/2012 0450   CREATININE 1.00 02/18/2012 0450   CALCIUM 8.7 02/18/2012 0450   GFRNONAA 66* 02/18/2012 0450   GFRAA 76* 02/18/2012 0450    CEA on 02/09/2012-2.1   Radiology: CTs of the chest, abdomen, and pelvis on 02/09/2012-no evidence for metastatic disease in the chest, massive cardiomegaly, the liver appears normal, left inguinal hernia, focal wall thickening in the transverse colon distal to the hepatic flexure, no lymphadenopathy, and negative for metastatic disease     Assessment/Plan:    1.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 Congestive heart failure  3. Atrial fibrillation  4. Status post mitral valve replacement  5. Iron deficiency anemia   6. Multiple tubular adenomas of the colon   Disposition:    He has been diagnosed with stage III colon cancer. I discussed the diagnosis, prognosis, and adjuvant treatment options with the patient and his daughter. We reviewed the details of the surgical pathology report. He understands the significant risk of developing recurrent colon cancer over the next several years. I reviewed the data supporting a benefit for adjuvant systemic chemotherapy in this setting. I recommend adjuvant capecitabine chemotherapy. We reviewed the specific toxicities associated with capecitabine including the chance for mucositis, diarrhea, skin hyperpigmentation, and the hand/foot syndrome. We discussed the potential  for an interaction between capecitabine and Coumadin.  Alfred Dennis indicated that he does not wish to receive adjuvant systemic chemotherapy.  He will return for an office visit and CEA in 4 months. We will recommend he begin iron placement therapy.  The patient and his daughter will contact us if he reconsiders the recommendation for adjuvant chemotherapy.   Alfred Dennis 02/29/2012, 8:13 PM

## 2012-02-29 NOTE — Telephone Encounter (Signed)
Gave pt appt for April 2014 lab and MD °

## 2012-03-01 DIAGNOSIS — I4891 Unspecified atrial fibrillation: Secondary | ICD-10-CM | POA: Diagnosis not present

## 2012-03-01 DIAGNOSIS — D62 Acute posthemorrhagic anemia: Secondary | ICD-10-CM | POA: Diagnosis not present

## 2012-03-01 DIAGNOSIS — I509 Heart failure, unspecified: Secondary | ICD-10-CM | POA: Diagnosis not present

## 2012-03-01 DIAGNOSIS — C184 Malignant neoplasm of transverse colon: Secondary | ICD-10-CM | POA: Diagnosis not present

## 2012-03-01 DIAGNOSIS — Z483 Aftercare following surgery for neoplasm: Secondary | ICD-10-CM | POA: Diagnosis not present

## 2012-03-01 DIAGNOSIS — M6281 Muscle weakness (generalized): Secondary | ICD-10-CM | POA: Diagnosis not present

## 2012-03-03 ENCOUNTER — Telehealth: Payer: Self-pay | Admitting: *Deleted

## 2012-03-03 DIAGNOSIS — C184 Malignant neoplasm of transverse colon: Secondary | ICD-10-CM | POA: Diagnosis not present

## 2012-03-03 DIAGNOSIS — M6281 Muscle weakness (generalized): Secondary | ICD-10-CM | POA: Diagnosis not present

## 2012-03-03 DIAGNOSIS — Z483 Aftercare following surgery for neoplasm: Secondary | ICD-10-CM | POA: Diagnosis not present

## 2012-03-03 DIAGNOSIS — D649 Anemia, unspecified: Secondary | ICD-10-CM

## 2012-03-03 DIAGNOSIS — I4891 Unspecified atrial fibrillation: Secondary | ICD-10-CM | POA: Diagnosis not present

## 2012-03-03 DIAGNOSIS — D62 Acute posthemorrhagic anemia: Secondary | ICD-10-CM | POA: Diagnosis not present

## 2012-03-03 DIAGNOSIS — I509 Heart failure, unspecified: Secondary | ICD-10-CM | POA: Diagnosis not present

## 2012-03-03 MED ORDER — FERROUS SULFATE 325 (65 FE) MG PO TBEC: 325.0000 mg | DELAYED_RELEASE_TABLET | Freq: Two times a day (BID) | ORAL | Status: DC

## 2012-03-03 NOTE — Telephone Encounter (Signed)
Left VM for daughter: MD suggests ferrous sulfate 325 mg twice daily (OTC) to help his anemia and improve his energy. Recheck CBC in 1 month. Scheduler will call for appointment

## 2012-03-04 ENCOUNTER — Telehealth: Payer: Self-pay | Admitting: Oncology

## 2012-03-04 DIAGNOSIS — Z79899 Other long term (current) drug therapy: Secondary | ICD-10-CM | POA: Diagnosis not present

## 2012-03-04 DIAGNOSIS — E785 Hyperlipidemia, unspecified: Secondary | ICD-10-CM | POA: Diagnosis not present

## 2012-03-04 NOTE — Telephone Encounter (Signed)
Called pt and left message regarding appt on January 2014 lab and MD

## 2012-03-08 DIAGNOSIS — I509 Heart failure, unspecified: Secondary | ICD-10-CM | POA: Diagnosis not present

## 2012-03-08 DIAGNOSIS — I4891 Unspecified atrial fibrillation: Secondary | ICD-10-CM | POA: Diagnosis not present

## 2012-03-08 DIAGNOSIS — Z483 Aftercare following surgery for neoplasm: Secondary | ICD-10-CM | POA: Diagnosis not present

## 2012-03-08 DIAGNOSIS — D62 Acute posthemorrhagic anemia: Secondary | ICD-10-CM | POA: Diagnosis not present

## 2012-03-08 DIAGNOSIS — M6281 Muscle weakness (generalized): Secondary | ICD-10-CM | POA: Diagnosis not present

## 2012-03-08 DIAGNOSIS — C184 Malignant neoplasm of transverse colon: Secondary | ICD-10-CM | POA: Diagnosis not present

## 2012-03-09 DIAGNOSIS — M6281 Muscle weakness (generalized): Secondary | ICD-10-CM | POA: Diagnosis not present

## 2012-03-09 DIAGNOSIS — C184 Malignant neoplasm of transverse colon: Secondary | ICD-10-CM | POA: Diagnosis not present

## 2012-03-09 DIAGNOSIS — I4891 Unspecified atrial fibrillation: Secondary | ICD-10-CM | POA: Diagnosis not present

## 2012-03-09 DIAGNOSIS — D62 Acute posthemorrhagic anemia: Secondary | ICD-10-CM | POA: Diagnosis not present

## 2012-03-09 DIAGNOSIS — Z483 Aftercare following surgery for neoplasm: Secondary | ICD-10-CM | POA: Diagnosis not present

## 2012-03-09 DIAGNOSIS — I509 Heart failure, unspecified: Secondary | ICD-10-CM | POA: Diagnosis not present

## 2012-03-11 DIAGNOSIS — Z7901 Long term (current) use of anticoagulants: Secondary | ICD-10-CM | POA: Diagnosis not present

## 2012-03-11 DIAGNOSIS — Z954 Presence of other heart-valve replacement: Secondary | ICD-10-CM | POA: Diagnosis not present

## 2012-03-15 ENCOUNTER — Ambulatory Visit (INDEPENDENT_AMBULATORY_CARE_PROVIDER_SITE_OTHER): Payer: Medicare Other | Admitting: General Surgery

## 2012-03-15 ENCOUNTER — Encounter (INDEPENDENT_AMBULATORY_CARE_PROVIDER_SITE_OTHER): Payer: Self-pay | Admitting: General Surgery

## 2012-03-15 VITALS — BP 132/74 | HR 80 | Temp 98.8°F | Resp 18 | Ht 65.0 in | Wt 156.2 lb

## 2012-03-15 DIAGNOSIS — C189 Malignant neoplasm of colon, unspecified: Secondary | ICD-10-CM

## 2012-03-15 NOTE — Patient Instructions (Signed)
Follow up in 6 months 

## 2012-03-15 NOTE — Progress Notes (Signed)
HISTORY: Patient is an afebrile male who was admitted to the hospital in late November for anemia and shortness of breath. He was found to have a bleeding colon cancer. He underwent extended laparoscopic right hemicolectomy at the beginning of December. It has been discharged and is doing well overall. He is not taking narcotics. He is on Coumadin for mechanical valve.  He denies fever/ chills.  He is having normal bowel movements.   EXAM: General:  Alert and oriented Incision:  Well healed.     PATHOLOGY: Colon, segmental resection for tumor, Right colon - ULCERATED INVASIVE MODERATELY DIFFERENTIATED ADENOCARCINOMA (2.5 CM). SEE COMMENT. - LYMPHOVASCULAR INVASION IDENTIFIED. - PERINEURAL INVASION IDENTIFIED. - ONE LYMPH NODE, POSITIVE FOR METASTATIC ADENOCARCINOMA (1/12). - TUMOR EXTENDS INTO PERICOLONIC SOFT TISSUE; VISCERAL PERITONEUM NEGATIVE FOR TUMOR. - THREE TUBULAR ADENOMAS; NEGATIVE FOR HIGH GRADE DYSPLASIA OR MALIGNANCY (2.7 CM, 1.7 CM, AND 0.5 CM). - FIBROUS OBLITERATION OF APPENDIX. - PROXIMAL AND DISTAL SURGICAL MARGINS, NEGATIVE FOR DYSPLASIA OR MALIGNANCY. - SEE TUMOR SYNOPTIC TEMPLATE BELOW.   ASSESSMENT AND PLAN:   Carcinoma of colon No evidence of surgical complications.    Follow up in 6 months.    Pt electing not to get adjuvant therapy.        Maudry Diego, MD Surgical Oncology, General & Endocrine Surgery [facility]  Astrid Divine, MD Maurice Small, MD

## 2012-03-15 NOTE — Assessment & Plan Note (Signed)
No evidence of surgical complications.    Follow up in 6 months.    Pt electing not to get adjuvant therapy.

## 2012-03-16 DIAGNOSIS — Z79899 Other long term (current) drug therapy: Secondary | ICD-10-CM | POA: Diagnosis not present

## 2012-03-21 DIAGNOSIS — Z7901 Long term (current) use of anticoagulants: Secondary | ICD-10-CM | POA: Diagnosis not present

## 2012-03-21 DIAGNOSIS — Z954 Presence of other heart-valve replacement: Secondary | ICD-10-CM | POA: Diagnosis not present

## 2012-03-29 ENCOUNTER — Encounter: Payer: Self-pay | Admitting: *Deleted

## 2012-03-29 NOTE — Progress Notes (Signed)
Daughter faxed updated med list from Dr. Gloris Manchester Turner's office. Updated our records.

## 2012-04-04 ENCOUNTER — Ambulatory Visit (HOSPITAL_BASED_OUTPATIENT_CLINIC_OR_DEPARTMENT_OTHER): Payer: Medicare Other | Admitting: Oncology

## 2012-04-04 ENCOUNTER — Other Ambulatory Visit (HOSPITAL_BASED_OUTPATIENT_CLINIC_OR_DEPARTMENT_OTHER): Payer: Medicare Other

## 2012-04-04 ENCOUNTER — Telehealth: Payer: Self-pay | Admitting: Oncology

## 2012-04-04 VITALS — BP 122/73 | HR 59 | Temp 97.4°F | Resp 20 | Ht 65.0 in | Wt 157.7 lb

## 2012-04-04 DIAGNOSIS — I4891 Unspecified atrial fibrillation: Secondary | ICD-10-CM | POA: Diagnosis not present

## 2012-04-04 DIAGNOSIS — D509 Iron deficiency anemia, unspecified: Secondary | ICD-10-CM

## 2012-04-04 DIAGNOSIS — C189 Malignant neoplasm of colon, unspecified: Secondary | ICD-10-CM

## 2012-04-04 DIAGNOSIS — C184 Malignant neoplasm of transverse colon: Secondary | ICD-10-CM

## 2012-04-04 DIAGNOSIS — D649 Anemia, unspecified: Secondary | ICD-10-CM

## 2012-04-04 DIAGNOSIS — D126 Benign neoplasm of colon, unspecified: Secondary | ICD-10-CM

## 2012-04-04 LAB — CBC WITH DIFFERENTIAL/PLATELET
BASO%: 1.1 % (ref 0.0–2.0)
EOS%: 0.9 % (ref 0.0–7.0)
HCT: 40 % (ref 38.4–49.9)
MCH: 26.8 pg — ABNORMAL LOW (ref 27.2–33.4)
MCHC: 31.5 g/dL — ABNORMAL LOW (ref 32.0–36.0)
MONO#: 0.9 10*3/uL (ref 0.1–0.9)
RBC: 4.7 10*6/uL (ref 4.20–5.82)
RDW: 17.7 % — ABNORMAL HIGH (ref 11.0–14.6)
WBC: 9.9 10*3/uL (ref 4.0–10.3)
lymph#: 2 10*3/uL (ref 0.9–3.3)

## 2012-04-04 NOTE — Telephone Encounter (Signed)
gv and printed appt schedule for pt for May °

## 2012-04-04 NOTE — Progress Notes (Signed)
    [facility]    OFFICE PROGRESS NOTE   INTERVAL HISTORY:   He reports feeling well. One episode of loose stool but he relates to taking iron.  Objective:  Vital signs in last 24 hours:  Blood pressure 122/73, pulse 59, temperature 97.4 F (36.3 C), temperature source Oral, resp. rate 20, height 5\' 5"  (1.651 m), weight 157 lb 11.2 oz (71.532 kg).    HEENT: Neck without mass Lymphatics: No cervical, supraclavicular, axillary, or inguinal nodes Resp: Inspiratory rhonchi at the left posterior base, no respiratory distress Cardio: Irregular, 2/6 systolic murmur GI: No hepatosplenomegaly, nontender, no mass,? Left inguinal hernia Vascular: Pitting edema below the knee bilaterally   Lab Results:  Lab Results  Component Value Date   WBC 9.9 04/04/2012   HGB 12.6* 04/04/2012   HCT 40.0 04/04/2012   MCV 85.0 04/04/2012   PLT 237 04/04/2012      Medications: I have reviewed the patient's current medications.  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 Congestive heart failure  3. Atrial fibrillation  4. Status post mitral valve replacement  5. Iron deficiency anemia -improved. He will decrease the ferrous sulfate to once daily 6. Multiple tubular adenomas of the colon   Disposition:  He appears stable. He decided against adjuvant chemotherapy. The iron deficiency anemia has improved.  [name] will return for an office visit and CEA in 4 months.   Thornton Papas, MD  04/04/2012  12:25 PM

## 2012-04-05 DIAGNOSIS — I4891 Unspecified atrial fibrillation: Secondary | ICD-10-CM | POA: Diagnosis not present

## 2012-04-05 DIAGNOSIS — Z7901 Long term (current) use of anticoagulants: Secondary | ICD-10-CM | POA: Diagnosis not present

## 2012-04-26 DIAGNOSIS — Z954 Presence of other heart-valve replacement: Secondary | ICD-10-CM | POA: Diagnosis not present

## 2012-04-26 DIAGNOSIS — Z7901 Long term (current) use of anticoagulants: Secondary | ICD-10-CM | POA: Diagnosis not present

## 2012-05-16 DIAGNOSIS — Z7901 Long term (current) use of anticoagulants: Secondary | ICD-10-CM | POA: Diagnosis not present

## 2012-06-13 DIAGNOSIS — I10 Essential (primary) hypertension: Secondary | ICD-10-CM | POA: Diagnosis not present

## 2012-06-13 DIAGNOSIS — E7800 Pure hypercholesterolemia, unspecified: Secondary | ICD-10-CM | POA: Diagnosis not present

## 2012-06-13 DIAGNOSIS — Z954 Presence of other heart-valve replacement: Secondary | ICD-10-CM | POA: Diagnosis not present

## 2012-06-13 DIAGNOSIS — I4891 Unspecified atrial fibrillation: Secondary | ICD-10-CM | POA: Diagnosis not present

## 2012-06-13 DIAGNOSIS — Z7901 Long term (current) use of anticoagulants: Secondary | ICD-10-CM | POA: Diagnosis not present

## 2012-06-13 DIAGNOSIS — I2510 Atherosclerotic heart disease of native coronary artery without angina pectoris: Secondary | ICD-10-CM | POA: Diagnosis not present

## 2012-06-13 DIAGNOSIS — Z79899 Other long term (current) drug therapy: Secondary | ICD-10-CM | POA: Diagnosis not present

## 2012-06-27 DIAGNOSIS — I10 Essential (primary) hypertension: Secondary | ICD-10-CM | POA: Diagnosis not present

## 2012-06-27 DIAGNOSIS — I4891 Unspecified atrial fibrillation: Secondary | ICD-10-CM | POA: Diagnosis not present

## 2012-06-27 DIAGNOSIS — Z79899 Other long term (current) drug therapy: Secondary | ICD-10-CM | POA: Diagnosis not present

## 2012-06-27 DIAGNOSIS — Z954 Presence of other heart-valve replacement: Secondary | ICD-10-CM | POA: Diagnosis not present

## 2012-06-27 DIAGNOSIS — I2510 Atherosclerotic heart disease of native coronary artery without angina pectoris: Secondary | ICD-10-CM | POA: Diagnosis not present

## 2012-06-27 DIAGNOSIS — Z7901 Long term (current) use of anticoagulants: Secondary | ICD-10-CM | POA: Diagnosis not present

## 2012-06-30 ENCOUNTER — Ambulatory Visit (HOSPITAL_BASED_OUTPATIENT_CLINIC_OR_DEPARTMENT_OTHER): Payer: Medicare Other | Admitting: Oncology

## 2012-06-30 ENCOUNTER — Telehealth: Payer: Self-pay | Admitting: Oncology

## 2012-06-30 ENCOUNTER — Other Ambulatory Visit: Payer: Medicare Other | Admitting: Lab

## 2012-06-30 VITALS — BP 122/69 | HR 92 | Temp 97.0°F | Resp 20 | Ht 65.0 in | Wt 156.1 lb

## 2012-06-30 DIAGNOSIS — I509 Heart failure, unspecified: Secondary | ICD-10-CM | POA: Diagnosis not present

## 2012-06-30 DIAGNOSIS — I4891 Unspecified atrial fibrillation: Secondary | ICD-10-CM

## 2012-06-30 DIAGNOSIS — C184 Malignant neoplasm of transverse colon: Secondary | ICD-10-CM | POA: Diagnosis not present

## 2012-06-30 DIAGNOSIS — C189 Malignant neoplasm of colon, unspecified: Secondary | ICD-10-CM

## 2012-06-30 DIAGNOSIS — D649 Anemia, unspecified: Secondary | ICD-10-CM

## 2012-06-30 DIAGNOSIS — D509 Iron deficiency anemia, unspecified: Secondary | ICD-10-CM

## 2012-06-30 LAB — CEA: CEA: 2.7 ng/mL (ref 0.0–5.0)

## 2012-06-30 NOTE — Progress Notes (Signed)
   [name]    OFFICE PROGRESS NOTE   INTERVAL HISTORY:   He returns as scheduled. No new complaint. He continues iron. No difficulty with bowel function. Good appetite. No dyspnea.  Objective:  Vital signs in last 24 hours:  Blood pressure 122/69, pulse 92, temperature 97 F (36.1 C), temperature source Oral, resp. rate 20, height 5\' 5"  (1.651 m), weight 156 lb 1.6 oz (70.806 kg).    HEENT: Neck without mass Lymphatics: No cervical, supraclavicular, axillary, or inguinal nodes Resp: Decreased breath sounds at the left lower chest with and inspiratory rhonchi. No respiratory distress Cardio: Irregular GI: No hepatosplenomegaly, nontender, no mass, reducible left inguinal hernia Vascular: Pitting edema at the low leg bilaterally   Lab Results:  Lab Results  Component Value Date   WBC 9.9 04/04/2012   HGB 12.6* 04/04/2012   HCT 40.0 04/04/2012   MCV 85.0 04/04/2012   PLT 237 04/04/2012      Medications: I have reviewed the patient's current medications.  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 Congestive heart failure  3. Atrial fibrillation  4. Status post mitral valve replacement  5. Iron deficiency anemia -maintained on iron, the hemoglobin was improved on 04/04/2012  6. Multiple tubular adenomas of the colon  7. Probable left inguinal hernia   Disposition:  He remains in clinical remission from colon cancer. He will return for an office visit and CEA in 6 months. We will followup on the CEA from today.  He has been maintained on iron for the past several months. The hemoglobin was improved on 04/04/2012. Alfred Dennis will discontinue iron. We will be sure the hemoglobin is checked within the next few months.   Thornton Papas, MD  06/30/2012  12:18 PM

## 2012-06-30 NOTE — Telephone Encounter (Signed)
gv and printed appt sched and avs for pt for OCT. °

## 2012-07-07 ENCOUNTER — Telehealth: Payer: Self-pay | Admitting: *Deleted

## 2012-07-07 NOTE — Telephone Encounter (Signed)
Confirmed with patient/wife that next appointment at [facility] is with Riki Rusk, [place] on 5/19 at 1030. Made them aware we will request a CBC with the PT/INR draw that day.

## 2012-07-25 DIAGNOSIS — Z954 Presence of other heart-valve replacement: Secondary | ICD-10-CM | POA: Diagnosis not present

## 2012-07-25 DIAGNOSIS — Z7901 Long term (current) use of anticoagulants: Secondary | ICD-10-CM | POA: Diagnosis not present

## 2012-08-02 ENCOUNTER — Other Ambulatory Visit: Payer: Medicare Other | Admitting: Lab

## 2012-08-02 ENCOUNTER — Ambulatory Visit: Payer: Medicare Other | Admitting: Oncology

## 2012-08-05 ENCOUNTER — Other Ambulatory Visit: Payer: Self-pay | Admitting: Cardiology

## 2012-08-05 DIAGNOSIS — M7989 Other specified soft tissue disorders: Secondary | ICD-10-CM

## 2012-08-05 DIAGNOSIS — M79605 Pain in left leg: Secondary | ICD-10-CM

## 2012-08-05 DIAGNOSIS — R609 Edema, unspecified: Secondary | ICD-10-CM | POA: Diagnosis not present

## 2012-08-05 DIAGNOSIS — Z7901 Long term (current) use of anticoagulants: Secondary | ICD-10-CM | POA: Diagnosis not present

## 2012-08-05 DIAGNOSIS — I2510 Atherosclerotic heart disease of native coronary artery without angina pectoris: Secondary | ICD-10-CM | POA: Diagnosis not present

## 2012-08-05 DIAGNOSIS — Z954 Presence of other heart-valve replacement: Secondary | ICD-10-CM | POA: Diagnosis not present

## 2012-08-05 DIAGNOSIS — I4891 Unspecified atrial fibrillation: Secondary | ICD-10-CM | POA: Diagnosis not present

## 2012-08-05 DIAGNOSIS — I10 Essential (primary) hypertension: Secondary | ICD-10-CM | POA: Diagnosis not present

## 2012-08-08 ENCOUNTER — Ambulatory Visit
Admission: RE | Admit: 2012-08-08 | Discharge: 2012-08-08 | Disposition: A | Discharge status: Home / Self Care | Payer: Medicare Other | Source: Ambulatory Visit | Attending: Cardiology | Admitting: Cardiology

## 2012-08-08 DIAGNOSIS — M7989 Other specified soft tissue disorders: Secondary | ICD-10-CM | POA: Diagnosis not present

## 2012-08-08 DIAGNOSIS — M79609 Pain in unspecified limb: Secondary | ICD-10-CM | POA: Diagnosis not present

## 2012-08-08 DIAGNOSIS — M79605 Pain in left leg: Secondary | ICD-10-CM

## 2012-08-26 DIAGNOSIS — Z954 Presence of other heart-valve replacement: Secondary | ICD-10-CM | POA: Diagnosis not present

## 2012-08-26 DIAGNOSIS — Z7901 Long term (current) use of anticoagulants: Secondary | ICD-10-CM | POA: Diagnosis not present

## 2012-09-23 DIAGNOSIS — Z954 Presence of other heart-valve replacement: Secondary | ICD-10-CM | POA: Diagnosis not present

## 2012-09-23 DIAGNOSIS — Z7901 Long term (current) use of anticoagulants: Secondary | ICD-10-CM | POA: Diagnosis not present

## 2012-10-17 DIAGNOSIS — Z954 Presence of other heart-valve replacement: Secondary | ICD-10-CM | POA: Diagnosis not present

## 2012-10-17 DIAGNOSIS — Z7901 Long term (current) use of anticoagulants: Secondary | ICD-10-CM | POA: Diagnosis not present

## 2012-11-14 DIAGNOSIS — Z7901 Long term (current) use of anticoagulants: Secondary | ICD-10-CM | POA: Diagnosis not present

## 2012-11-14 DIAGNOSIS — Z954 Presence of other heart-valve replacement: Secondary | ICD-10-CM | POA: Diagnosis not present

## 2012-12-01 ENCOUNTER — Telehealth: Payer: Self-pay | Admitting: Oncology

## 2012-12-01 NOTE — Telephone Encounter (Signed)
Pt called and cancelled appt sor October Nurse notified, did not r/s appt

## 2012-12-02 ENCOUNTER — Encounter: Payer: Self-pay | Admitting: Oncology

## 2012-12-02 ENCOUNTER — Telehealth: Payer: Self-pay | Admitting: Oncology

## 2012-12-02 NOTE — Telephone Encounter (Signed)
pt called and r/s appt for October 2014 that he cancelled yesrerday

## 2012-12-06 ENCOUNTER — Telehealth: Payer: Self-pay | Admitting: *Deleted

## 2012-12-12 ENCOUNTER — Ambulatory Visit: Payer: Medicare Other | Admitting: Cardiology

## 2012-12-12 NOTE — Telephone Encounter (Signed)
Noted office visit is rescheduled.

## 2012-12-13 ENCOUNTER — Ambulatory Visit (INDEPENDENT_AMBULATORY_CARE_PROVIDER_SITE_OTHER): Payer: Medicare Other | Admitting: Pharmacist

## 2012-12-13 DIAGNOSIS — Z954 Presence of other heart-valve replacement: Secondary | ICD-10-CM | POA: Diagnosis not present

## 2012-12-13 DIAGNOSIS — I059 Rheumatic mitral valve disease, unspecified: Secondary | ICD-10-CM | POA: Diagnosis not present

## 2012-12-13 LAB — POCT INR: INR: 3.7

## 2012-12-22 ENCOUNTER — Encounter: Payer: Self-pay | Admitting: Cardiology

## 2012-12-22 ENCOUNTER — Encounter: Payer: Self-pay | Admitting: *Deleted

## 2012-12-25 ENCOUNTER — Encounter: Payer: Self-pay | Admitting: Cardiology

## 2012-12-26 ENCOUNTER — Ambulatory Visit (INDEPENDENT_AMBULATORY_CARE_PROVIDER_SITE_OTHER): Payer: Medicare Other | Admitting: Pharmacist

## 2012-12-26 ENCOUNTER — Encounter: Payer: Self-pay | Admitting: Cardiology

## 2012-12-26 ENCOUNTER — Ambulatory Visit (INDEPENDENT_AMBULATORY_CARE_PROVIDER_SITE_OTHER): Payer: Medicare Other | Admitting: Cardiology

## 2012-12-26 VITALS — BP 122/62 | HR 75 | Ht 67.0 in | Wt 151.0 lb

## 2012-12-26 DIAGNOSIS — I4891 Unspecified atrial fibrillation: Secondary | ICD-10-CM | POA: Diagnosis not present

## 2012-12-26 DIAGNOSIS — E785 Hyperlipidemia, unspecified: Secondary | ICD-10-CM

## 2012-12-26 DIAGNOSIS — Z951 Presence of aortocoronary bypass graft: Secondary | ICD-10-CM | POA: Insufficient documentation

## 2012-12-26 DIAGNOSIS — I2510 Atherosclerotic heart disease of native coronary artery without angina pectoris: Secondary | ICD-10-CM

## 2012-12-26 DIAGNOSIS — I10 Essential (primary) hypertension: Secondary | ICD-10-CM

## 2012-12-26 DIAGNOSIS — Z952 Presence of prosthetic heart valve: Secondary | ICD-10-CM

## 2012-12-26 DIAGNOSIS — Z954 Presence of other heart-valve replacement: Secondary | ICD-10-CM

## 2012-12-26 DIAGNOSIS — I059 Rheumatic mitral valve disease, unspecified: Secondary | ICD-10-CM

## 2012-12-26 DIAGNOSIS — I2589 Other forms of chronic ischemic heart disease: Secondary | ICD-10-CM | POA: Diagnosis not present

## 2012-12-26 DIAGNOSIS — I482 Chronic atrial fibrillation, unspecified: Secondary | ICD-10-CM

## 2012-12-26 DIAGNOSIS — I420 Dilated cardiomyopathy: Secondary | ICD-10-CM

## 2012-12-26 DIAGNOSIS — I255 Ischemic cardiomyopathy: Secondary | ICD-10-CM

## 2012-12-26 LAB — BASIC METABOLIC PANEL
CO2: 31 mEq/L (ref 19–32)
Calcium: 9.4 mg/dL (ref 8.4–10.5)
Creatinine, Ser: 1.2 mg/dL (ref 0.4–1.5)
GFR: 60.89 mL/min (ref >60.00)
Sodium: 140 mEq/L (ref 135–145)

## 2012-12-26 LAB — POCT INR: INR: 4.2

## 2012-12-26 NOTE — Patient Instructions (Signed)
Please go to the lab today to have a Bmet drawn  Your physician recommends that you continue on your current medications as directed. Please refer to the Current Medication list given to you today.   Your physician wants you to follow-up in: 6 months with Dr.Turner You will receive a reminder letter in the mail two months in advance. If you Kameryn't receive a letter, please call our office to schedule the follow-up appointment.

## 2012-12-26 NOTE — Progress Notes (Signed)
[address] [place], [place]  [address] Phone: [phone] Fax:  [phone]  Date:  12/26/2012   ID:  Alfred Dennis, DOB [birth year], MRN [id number]  PCP:  Astrid Divine, MD  Cardiologist:  Armanda Magic, MD     History of Present Illness: Alfred Dennis is a 79 y.o. male with a history of severe MR s/p MVR, HTN, dyslipidemia, chronic atrial fibrillation, CAD s/p CABG and ischemic dilated CM with EF 35-30% at time of his colon surgery a year ago.  He denies any chest pain.  He has chronic DOE which is unchanged.  He denies any palpitations, dizziness or syncope.  He occasionally has some mild RLE edema.   Wt Readings from Last 3 Encounters:  12/26/12 151 lb (68.493 kg)  06/30/12 156 lb 1.6 oz (70.806 kg)  04/04/12 157 lb 11.2 oz (71.532 kg)     Past Medical History  Diagnosis Date  . Hypertension   . Shortness of breath     "w/any activity lately" (02/03/2012)  . CHF (congestive heart failure)     mild/note 02/03/2012  . Arthritis     "left foot; fingers" (02/03/2012)  . ARF (acute renal failure)   . GIB (gastrointestinal bleeding)   . Chronic atrial fibrillation   . Non-sustained ventricular tachycardia   . Chronic anticoagulation   . Coronary artery disease 05/2000    s/p CABG with LIMA to LAD, SVG to IM, SVG to OM  . Mitral valvular regurgitation     s/p MVR with mechanical valve  . Ischemic dilated cardiomyopathy     EF 25-30% by echo 01/2012 at time of colon surgery  . Hyperlipidemia   . Stroke     after CABG  . Carcinoma of colon   . Basal cell carcinoma of shoulder 10/2010    Current Outpatient Prescriptions  Medication Sig Dispense Refill  . furosemide (LASIX) 20 MG tablet Take 60 mg by mouth daily.      . hydrALAZINE (APRESOLINE) 25 MG tablet Take by mouth. Take 1 and 1/2 tablets four times daily      . metoprolol (LOPRESSOR) 50 MG tablet Take 100 mg by mouth 2 (two) times daily.      . Multiple Vitamin (MULTIVITAMIN WITH  MINERALS) TABS Take 1 tablet by mouth daily.      Marland Kitchen omega-3 acid ethyl esters (LOVAZA) 1 G capsule Take 1 g by mouth 3 (three) times daily.      . simvastatin (ZOCOR) 10 MG tablet Take 10 mg by mouth at bedtime.      Marland Kitchen warfarin (COUMADIN) 5 MG tablet Take 5 mg by mouth daily. Take 2.5 mg on M-W-F-Sat Coumadin clinic per Dr. Armanda Magic       No current facility-administered medications for this visit.    Allergies:   No Known Allergies  Social History:  The patient  reports that he quit smoking about 32 years ago. His smoking use included Cigarettes. He has a 20 pack-year smoking history. He has never used smokeless tobacco. He reports that he does not drink alcohol or use illicit drugs.   Family History:  The patient's family history includes Heart disease in his mother.   ROS:  Please see the history of present illness.      All other systems reviewed and negative.   PHYSICAL EXAM: VS:  BP 122/62  Pulse 75  Ht 5\' 7"  (1.702 m)  Wt 151 lb (68.493 kg)  BMI 23.64 kg/m2  SpO2 96% Well nourished, well developed, in no acute distress HEENT: normal Neck: no JVD Cardiac:  normal S1, S2; irregularly irregular; no murmur, crisp mechanical heart sounds Lungs:  clear to auscultation bilaterally, no wheezing, rhonchi or rales Abd: soft, nontender, no hepatomegaly ZOX:WRUEA edema Skin: warm and dry Neuro:  CNs 2-12 intact, no focal abnormalities noted       ASSESSMENT AND PLAN:  1. Chronic atrial fibrillation rate controlled  - continue Warfarin and metoprolol 2. Chronic systemic anticoagulation 3. HTN - well controlled  - continue Hydralazine/metoprolol 4. Dyslipidemia - LDL at goal  - continue simvastatin 5. CAD s/p CABG with no angina 6. Ischemic dilated CM - well compensated  - continue Lasix  Followup with me in 6 months  Signed, Armanda Magic, MD 12/26/2012 1:34 PM

## 2012-12-28 DIAGNOSIS — Z23 Encounter for immunization: Secondary | ICD-10-CM | POA: Diagnosis not present

## 2012-12-29 ENCOUNTER — Other Ambulatory Visit: Payer: Self-pay | Admitting: *Deleted

## 2012-12-30 ENCOUNTER — Telehealth: Payer: Self-pay | Admitting: Oncology

## 2012-12-30 NOTE — Telephone Encounter (Signed)
Called nurse left message that ML does not have oepning on 10/28 , talked to pt ans instructed him donot come Monday and we will call him  for new appt

## 2013-01-02 ENCOUNTER — Other Ambulatory Visit: Payer: Medicare Other | Admitting: Lab

## 2013-01-02 ENCOUNTER — Telehealth: Payer: Self-pay | Admitting: Oncology

## 2013-01-02 ENCOUNTER — Ambulatory Visit: Payer: Medicare Other | Admitting: Oncology

## 2013-01-02 NOTE — Telephone Encounter (Signed)
Gave pt appt for 01/04/13 lab and MD

## 2013-01-04 ENCOUNTER — Ambulatory Visit (HOSPITAL_BASED_OUTPATIENT_CLINIC_OR_DEPARTMENT_OTHER): Payer: Medicare Other | Admitting: Nurse Practitioner

## 2013-01-04 ENCOUNTER — Ambulatory Visit: Payer: Medicare Other | Admitting: Nurse Practitioner

## 2013-01-04 ENCOUNTER — Other Ambulatory Visit (HOSPITAL_BASED_OUTPATIENT_CLINIC_OR_DEPARTMENT_OTHER): Payer: Medicare Other | Admitting: Lab

## 2013-01-04 ENCOUNTER — Telehealth: Payer: Self-pay | Admitting: *Deleted

## 2013-01-04 VITALS — BP 118/71 | HR 87 | Temp 96.9°F | Resp 18 | Ht 67.0 in | Wt 148.3 lb

## 2013-01-04 DIAGNOSIS — J449 Chronic obstructive pulmonary disease, unspecified: Secondary | ICD-10-CM | POA: Diagnosis not present

## 2013-01-04 DIAGNOSIS — C189 Malignant neoplasm of colon, unspecified: Secondary | ICD-10-CM

## 2013-01-04 DIAGNOSIS — C184 Malignant neoplasm of transverse colon: Secondary | ICD-10-CM

## 2013-01-04 DIAGNOSIS — I4891 Unspecified atrial fibrillation: Secondary | ICD-10-CM

## 2013-01-04 DIAGNOSIS — D649 Anemia, unspecified: Secondary | ICD-10-CM

## 2013-01-04 LAB — CBC WITH DIFFERENTIAL/PLATELET
Basophils Absolute: 0.1 10*3/uL (ref 0.0–0.1)
EOS%: 0.5 % (ref 0.0–7.0)
Eosinophils Absolute: 0 10*3/uL (ref 0.0–0.5)
HGB: 13.5 g/dL (ref 13.0–17.1)
MCH: 31.3 pg (ref 27.2–33.4)
MCV: 94 fL (ref 79.3–98.0)
MONO%: 7.7 % (ref 0.0–14.0)
NEUT#: 6.8 10*3/uL — ABNORMAL HIGH (ref 1.5–6.5)
RBC: 4.32 10*6/uL (ref 4.20–5.82)
RDW: 13.8 % (ref 11.0–14.6)
lymph#: 2.7 10*3/uL (ref 0.9–3.3)

## 2013-01-04 NOTE — Progress Notes (Signed)
OFFICE PROGRESS NOTE  Interval history:  [name] is an 79 year old man diagnosed with stage III colon cancer December 2013. He decided against adjuvant chemotherapy. He is seen today for scheduled followup.  He overall feels well. He is having regular bowel movements. No bloody or black stools. No abdominal pain. He denies nausea/vomiting. He has a good appetite. He has stable dyspnea on exertion. No cough. No fever.   Objective: Blood pressure 118/71, pulse 87, temperature 96.9 F (36.1 C), temperature source Oral, resp. rate 18, height 5\' 7"  (1.702 m), weight 148 lb 4.8 oz (67.268 kg).  Oropharynx is without thrush or ulceration. He is missing multiple teeth. No palpable cervical, supraclavicular, axillary or inguinal lymph nodes. Lungs are clear. Irregular cardiac rhythm. 2/6 systolic murmur. Abdomen soft. No organomegaly. No mass. Trace lower leg edema bilaterally.  Lab Results: Lab Results  Component Value Date   WBC 10.5* 01/04/2013   HGB 13.5 01/04/2013   HCT 40.6 01/04/2013   MCV 94.0 01/04/2013   PLT 193 01/04/2013    Chemistry:    Chemistry      Component Value Date/Time   NA 140 12/26/2012 1357   K 4.0 12/26/2012 1357   CL 103 12/26/2012 1357   CO2 31 12/26/2012 1357   BUN 22 12/26/2012 1357   CREATININE 1.2 12/26/2012 1357      Component Value Date/Time   CALCIUM 9.4 12/26/2012 1357       Studies/Results: No results found.  Medications: I have reviewed the patient's current medications.  Assessment/Plan:  1. Adenocarcinoma of the transverse colon (T3 N1) status post extended right colectomy on 02/10/2012. 2. Congestive heart failure. 3. Atrial fibrillation. 4. Status post mitral valve replacement. 5. History of iron deficiency anemia. Hemoglobin is now in normal range. 6. Multiple tubular adenomas of the colon. 7. Probable left inguinal hernia on exam 06/30/2012.  Disposition-he remains in clinical remission from colon cancer. We will  followup on the CEA from today. He will return for an office visit and CEA in 6 months. He will contact the office in the interim with any problems.  Lonna Cobb ANP/GNP-BC

## 2013-01-04 NOTE — Telephone Encounter (Signed)
appts made and printed...td 

## 2013-01-05 LAB — CEA: CEA: 2.2 ng/mL (ref 0.0–5.0)

## 2013-01-06 ENCOUNTER — Ambulatory Visit (INDEPENDENT_AMBULATORY_CARE_PROVIDER_SITE_OTHER): Payer: Medicare Other | Admitting: Pharmacist

## 2013-01-06 DIAGNOSIS — Z954 Presence of other heart-valve replacement: Secondary | ICD-10-CM

## 2013-01-06 DIAGNOSIS — I059 Rheumatic mitral valve disease, unspecified: Secondary | ICD-10-CM | POA: Diagnosis not present

## 2013-01-06 LAB — POCT INR: INR: 2.2

## 2013-01-20 ENCOUNTER — Ambulatory Visit (INDEPENDENT_AMBULATORY_CARE_PROVIDER_SITE_OTHER): Payer: Medicare Other | Admitting: Pharmacist

## 2013-01-20 DIAGNOSIS — Z954 Presence of other heart-valve replacement: Secondary | ICD-10-CM | POA: Diagnosis not present

## 2013-01-20 DIAGNOSIS — I059 Rheumatic mitral valve disease, unspecified: Secondary | ICD-10-CM | POA: Diagnosis not present

## 2013-01-20 LAB — POCT INR: INR: 3.9

## 2013-02-03 ENCOUNTER — Ambulatory Visit (INDEPENDENT_AMBULATORY_CARE_PROVIDER_SITE_OTHER): Payer: Medicare Other | Admitting: Pharmacist

## 2013-02-03 DIAGNOSIS — Z954 Presence of other heart-valve replacement: Secondary | ICD-10-CM | POA: Diagnosis not present

## 2013-02-03 DIAGNOSIS — I059 Rheumatic mitral valve disease, unspecified: Secondary | ICD-10-CM | POA: Diagnosis not present

## 2013-02-24 ENCOUNTER — Ambulatory Visit (INDEPENDENT_AMBULATORY_CARE_PROVIDER_SITE_OTHER): Payer: Medicare Other | Admitting: Pharmacist

## 2013-02-24 DIAGNOSIS — I059 Rheumatic mitral valve disease, unspecified: Secondary | ICD-10-CM | POA: Diagnosis not present

## 2013-02-24 DIAGNOSIS — Z954 Presence of other heart-valve replacement: Secondary | ICD-10-CM | POA: Diagnosis not present

## 2013-03-08 ENCOUNTER — Other Ambulatory Visit: Payer: Medicare Other

## 2013-03-10 ENCOUNTER — Ambulatory Visit (INDEPENDENT_AMBULATORY_CARE_PROVIDER_SITE_OTHER): Payer: Medicare Other | Admitting: *Deleted

## 2013-03-10 DIAGNOSIS — I10 Essential (primary) hypertension: Secondary | ICD-10-CM | POA: Diagnosis not present

## 2013-03-10 DIAGNOSIS — E785 Hyperlipidemia, unspecified: Secondary | ICD-10-CM | POA: Diagnosis not present

## 2013-03-10 LAB — LIPID PANEL
CHOL/HDL RATIO: 2
CHOLESTEROL: 117 mg/dL (ref 0–200)
HDL: 51.5 mg/dL (ref >39.00)
LDL Cholesterol: 47 mg/dL (ref 0–99)
TRIGLYCERIDES: 93 mg/dL (ref 0.0–149.0)
VLDL: 18.6 mg/dL (ref 0.0–40.0)

## 2013-03-10 LAB — HEPATIC FUNCTION PANEL
ALBUMIN: 4.2 g/dL (ref 3.5–5.2)
ALT: 39 U/L (ref 0–53)
AST: 57 U/L — AB (ref 0–37)
Alkaline Phosphatase: 52 U/L (ref 39–117)
Bilirubin, Direct: 0.2 mg/dL (ref 0.0–0.3)
TOTAL PROTEIN: 7 g/dL (ref 6.0–8.3)
Total Bilirubin: 1.4 mg/dL — ABNORMAL HIGH (ref 0.3–1.2)

## 2013-03-13 ENCOUNTER — Other Ambulatory Visit: Payer: Self-pay | Admitting: General Surgery

## 2013-03-13 ENCOUNTER — Encounter: Payer: Self-pay | Admitting: General Surgery

## 2013-03-13 DIAGNOSIS — E785 Hyperlipidemia, unspecified: Secondary | ICD-10-CM

## 2013-03-24 ENCOUNTER — Ambulatory Visit (INDEPENDENT_AMBULATORY_CARE_PROVIDER_SITE_OTHER): Payer: Medicare Other | Admitting: Pharmacist

## 2013-03-24 DIAGNOSIS — Z954 Presence of other heart-valve replacement: Secondary | ICD-10-CM

## 2013-03-24 DIAGNOSIS — I059 Rheumatic mitral valve disease, unspecified: Secondary | ICD-10-CM

## 2013-03-24 LAB — POCT INR: INR: 2.9

## 2013-04-21 ENCOUNTER — Ambulatory Visit (INDEPENDENT_AMBULATORY_CARE_PROVIDER_SITE_OTHER): Payer: Medicare Other | Admitting: *Deleted

## 2013-04-21 DIAGNOSIS — Z954 Presence of other heart-valve replacement: Secondary | ICD-10-CM | POA: Diagnosis not present

## 2013-04-21 DIAGNOSIS — I059 Rheumatic mitral valve disease, unspecified: Secondary | ICD-10-CM | POA: Diagnosis not present

## 2013-04-21 LAB — POCT INR: INR: 4.1

## 2013-05-05 ENCOUNTER — Ambulatory Visit (INDEPENDENT_AMBULATORY_CARE_PROVIDER_SITE_OTHER): Payer: Medicare Other | Admitting: Pharmacist

## 2013-05-05 DIAGNOSIS — I059 Rheumatic mitral valve disease, unspecified: Secondary | ICD-10-CM | POA: Diagnosis not present

## 2013-05-05 DIAGNOSIS — Z954 Presence of other heart-valve replacement: Secondary | ICD-10-CM | POA: Diagnosis not present

## 2013-05-05 LAB — POCT INR: INR: 2.9

## 2013-05-15 ENCOUNTER — Other Ambulatory Visit: Payer: Self-pay | Admitting: Cardiology

## 2013-05-26 ENCOUNTER — Ambulatory Visit (INDEPENDENT_AMBULATORY_CARE_PROVIDER_SITE_OTHER): Payer: Medicare Other | Admitting: Pharmacist

## 2013-05-26 DIAGNOSIS — Z954 Presence of other heart-valve replacement: Secondary | ICD-10-CM | POA: Diagnosis not present

## 2013-05-26 DIAGNOSIS — I059 Rheumatic mitral valve disease, unspecified: Secondary | ICD-10-CM | POA: Diagnosis not present

## 2013-05-26 LAB — POCT INR: INR: 2.2

## 2013-06-09 ENCOUNTER — Ambulatory Visit (INDEPENDENT_AMBULATORY_CARE_PROVIDER_SITE_OTHER): Payer: Medicare Other | Admitting: Pharmacist

## 2013-06-09 DIAGNOSIS — Z954 Presence of other heart-valve replacement: Secondary | ICD-10-CM

## 2013-06-09 DIAGNOSIS — I059 Rheumatic mitral valve disease, unspecified: Secondary | ICD-10-CM

## 2013-06-09 LAB — POCT INR: INR: 3.4

## 2013-06-30 ENCOUNTER — Ambulatory Visit (INDEPENDENT_AMBULATORY_CARE_PROVIDER_SITE_OTHER): Payer: Medicare Other | Admitting: Pharmacist

## 2013-06-30 DIAGNOSIS — I059 Rheumatic mitral valve disease, unspecified: Secondary | ICD-10-CM | POA: Diagnosis not present

## 2013-06-30 DIAGNOSIS — Z954 Presence of other heart-valve replacement: Secondary | ICD-10-CM | POA: Diagnosis not present

## 2013-06-30 LAB — POCT INR: INR: 4.4

## 2013-07-05 ENCOUNTER — Encounter: Payer: Self-pay | Admitting: Cardiology

## 2013-07-07 ENCOUNTER — Other Ambulatory Visit (HOSPITAL_BASED_OUTPATIENT_CLINIC_OR_DEPARTMENT_OTHER): Payer: Medicare Other

## 2013-07-07 ENCOUNTER — Ambulatory Visit (HOSPITAL_BASED_OUTPATIENT_CLINIC_OR_DEPARTMENT_OTHER): Payer: Medicare Other | Admitting: Oncology

## 2013-07-07 ENCOUNTER — Telehealth: Payer: Self-pay | Admitting: Oncology

## 2013-07-07 VITALS — BP 136/62 | HR 76 | Temp 97.8°F | Resp 18 | Ht 67.0 in | Wt 153.1 lb

## 2013-07-07 DIAGNOSIS — K4090 Unilateral inguinal hernia, without obstruction or gangrene, not specified as recurrent: Secondary | ICD-10-CM

## 2013-07-07 DIAGNOSIS — C184 Malignant neoplasm of transverse colon: Secondary | ICD-10-CM

## 2013-07-07 DIAGNOSIS — C189 Malignant neoplasm of colon, unspecified: Secondary | ICD-10-CM

## 2013-07-07 DIAGNOSIS — I4891 Unspecified atrial fibrillation: Secondary | ICD-10-CM | POA: Diagnosis not present

## 2013-07-07 DIAGNOSIS — I509 Heart failure, unspecified: Secondary | ICD-10-CM

## 2013-07-07 DIAGNOSIS — D509 Iron deficiency anemia, unspecified: Secondary | ICD-10-CM | POA: Diagnosis not present

## 2013-07-07 LAB — CBC WITH DIFFERENTIAL/PLATELET
BASO%: 0.5 % (ref 0.0–2.0)
Basophils Absolute: 0 10*3/uL (ref 0.0–0.1)
EOS%: 0.7 % (ref 0.0–7.0)
Eosinophils Absolute: 0.1 10*3/uL (ref 0.0–0.5)
HCT: 38 % — ABNORMAL LOW (ref 38.4–49.9)
HGB: 12.2 g/dL — ABNORMAL LOW (ref 13.0–17.1)
LYMPH%: 21.1 % (ref 14.0–49.0)
MCH: 29.3 pg (ref 27.2–33.4)
MCHC: 32.1 g/dL (ref 32.0–36.0)
MCV: 91.5 fL (ref 79.3–98.0)
MONO#: 0.9 10*3/uL (ref 0.1–0.9)
MONO%: 9.7 % (ref 0.0–14.0)
NEUT#: 6.3 10*3/uL (ref 1.5–6.5)
NEUT%: 68 % (ref 39.0–75.0)
Platelets: 178 10*3/uL (ref 140–400)
RBC: 4.16 10*6/uL — AB (ref 4.20–5.82)
RDW: 15.3 % — ABNORMAL HIGH (ref 11.0–14.6)
WBC: 9.3 10*3/uL (ref 4.0–10.3)
lymph#: 2 10*3/uL (ref 0.9–3.3)

## 2013-07-07 NOTE — Progress Notes (Signed)
  [facility] OFFICE PROGRESS NOTE   Diagnosis: Colon cancer  INTERVAL HISTORY:   He returns as scheduled. He reports feeling well. No complaint. He is taking care of his wife with Alzheimer's.  Objective:  Vital signs in last 24 hours:  Blood pressure 136/62, pulse 76, temperature 97.8 F (36.6 C), temperature source Oral, resp. rate 18, height 5\' 7"  (1.702 m), weight 153 lb 1.6 oz (69.446 kg), SpO2 100.00%.    HEENT: Neck without mass Lymphatics: No cervical, supraclavicular, axillary, or inguinal nodes Resp: End inspiratory coarse rhonchi and wheezes at the left greater than right chest, no respiratory distress Cardio: Irregular, mechanical click GI: No hepatosplenomegaly, reducible left inguinal hernia Vascular: 2+ edema below the knee bilaterally   Lab Results:  Lab Results  Component Value Date   WBC 9.3 07/07/2013   HGB 12.2* 07/07/2013   HCT 38.0* 07/07/2013   MCV 91.5 07/07/2013   PLT 178 07/07/2013   NEUTROABS 6.3 07/07/2013   Lab Results  Component Value Date   CEA 2.2 01/04/2013    Medications: I have reviewed the patient's current medications.  Assessment/Plan: 1. Adenocarcinoma of the transverse colon (T3 N1) status post extended right colectomy on 02/10/2012. 2. Congestive heart failure. 3. Atrial fibrillation. 4. Status post mitral valve replacement. 5. History of iron deficiency anemia. Hemoglobin is now in normal range. 6. Multiple tubular adenomas of the colon. 7. Left inguinal hernia  Disposition:  He remains in clinical remission from colon cancer. We will followup on the CEA from today. Mr. Dougher will return for an office visit and CEA in 6 months. I will send a note to Dr. Carlean Purl regarding the indication for a surveillance colonoscopy.  Ladell Pier, MD  07/07/2013  3:47 PM

## 2013-07-07 NOTE — Telephone Encounter (Signed)
gve there pt his nov 2015 appt calendar along with the avs.

## 2013-07-08 LAB — CEA: CEA: 2.5 ng/mL (ref 0.0–5.0)

## 2013-07-10 ENCOUNTER — Ambulatory Visit (INDEPENDENT_AMBULATORY_CARE_PROVIDER_SITE_OTHER): Payer: Medicare Other | Admitting: *Deleted

## 2013-07-10 ENCOUNTER — Encounter: Payer: Self-pay | Admitting: Cardiology

## 2013-07-10 DIAGNOSIS — Z954 Presence of other heart-valve replacement: Secondary | ICD-10-CM

## 2013-07-10 DIAGNOSIS — I059 Rheumatic mitral valve disease, unspecified: Secondary | ICD-10-CM

## 2013-07-10 DIAGNOSIS — Z5181 Encounter for therapeutic drug level monitoring: Secondary | ICD-10-CM | POA: Insufficient documentation

## 2013-07-10 LAB — POCT INR: INR: 4.1

## 2013-07-11 ENCOUNTER — Telehealth: Payer: Self-pay | Admitting: *Deleted

## 2013-07-11 NOTE — Telephone Encounter (Signed)
Called and spoke with patient, per Dr Benay Spice, CEA is normal/stable. Patient verbalized understanding.

## 2013-07-11 NOTE — Telephone Encounter (Signed)
Message copied by Verlon Setting on Tue Jul 11, 2013  5:10 PM ------      Message from: Tania Ade      Created: Tue Jul 11, 2013 11:59 AM                   ----- Message -----         From: Ladell Pier, MD         Sent: 07/10/2013   6:46 PM           To: Tania Ade, RN, Ludwig Lean, RN, #            Please call patient, cea is normal ------

## 2013-07-21 ENCOUNTER — Telehealth: Payer: Self-pay | Admitting: Cardiology

## 2013-07-21 NOTE — Telephone Encounter (Signed)
**Note De-Identified Delta Deshmukh Obfuscation** The pts daughter is advised, she verbalized understanding.

## 2013-07-21 NOTE — Telephone Encounter (Signed)
PCP needs to address this

## 2013-07-21 NOTE — Telephone Encounter (Signed)
New message     Daughter calling asking can her father take tylenol . Having problem with his back. / need something for pain. Daughter will contact pcp .

## 2013-07-21 NOTE — Telephone Encounter (Signed)
To Dr Radford Pax to advise. Ok to tell daughter of pt that PCP needs to be one that advises?

## 2013-07-24 ENCOUNTER — Ambulatory Visit (INDEPENDENT_AMBULATORY_CARE_PROVIDER_SITE_OTHER): Payer: Medicare Other | Admitting: Pharmacist

## 2013-07-24 DIAGNOSIS — Z954 Presence of other heart-valve replacement: Secondary | ICD-10-CM | POA: Diagnosis not present

## 2013-07-24 DIAGNOSIS — I059 Rheumatic mitral valve disease, unspecified: Secondary | ICD-10-CM | POA: Diagnosis not present

## 2013-07-24 DIAGNOSIS — Z5181 Encounter for therapeutic drug level monitoring: Secondary | ICD-10-CM | POA: Diagnosis not present

## 2013-07-24 LAB — POCT INR: INR: 3.2

## 2013-08-14 ENCOUNTER — Ambulatory Visit (INDEPENDENT_AMBULATORY_CARE_PROVIDER_SITE_OTHER): Payer: Medicare Other | Admitting: Pharmacist

## 2013-08-14 DIAGNOSIS — Z5181 Encounter for therapeutic drug level monitoring: Secondary | ICD-10-CM

## 2013-08-14 DIAGNOSIS — Z954 Presence of other heart-valve replacement: Secondary | ICD-10-CM

## 2013-08-14 DIAGNOSIS — I059 Rheumatic mitral valve disease, unspecified: Secondary | ICD-10-CM

## 2013-08-14 LAB — POCT INR: INR: 3.9

## 2013-08-28 ENCOUNTER — Ambulatory Visit (INDEPENDENT_AMBULATORY_CARE_PROVIDER_SITE_OTHER): Payer: Medicare Other | Admitting: Pharmacist

## 2013-08-28 DIAGNOSIS — I059 Rheumatic mitral valve disease, unspecified: Secondary | ICD-10-CM | POA: Diagnosis not present

## 2013-08-28 DIAGNOSIS — Z5181 Encounter for therapeutic drug level monitoring: Secondary | ICD-10-CM | POA: Diagnosis not present

## 2013-08-28 DIAGNOSIS — Z954 Presence of other heart-valve replacement: Secondary | ICD-10-CM

## 2013-08-28 LAB — POCT INR: INR: 3.8

## 2013-09-11 ENCOUNTER — Ambulatory Visit (INDEPENDENT_AMBULATORY_CARE_PROVIDER_SITE_OTHER): Payer: Medicare Other | Admitting: Pharmacist

## 2013-09-11 ENCOUNTER — Other Ambulatory Visit (INDEPENDENT_AMBULATORY_CARE_PROVIDER_SITE_OTHER): Payer: Medicare Other

## 2013-09-11 ENCOUNTER — Encounter: Payer: Self-pay | Admitting: General Surgery

## 2013-09-11 ENCOUNTER — Other Ambulatory Visit: Payer: Self-pay | Admitting: General Surgery

## 2013-09-11 DIAGNOSIS — E785 Hyperlipidemia, unspecified: Secondary | ICD-10-CM

## 2013-09-11 DIAGNOSIS — Z5181 Encounter for therapeutic drug level monitoring: Secondary | ICD-10-CM

## 2013-09-11 DIAGNOSIS — Z954 Presence of other heart-valve replacement: Secondary | ICD-10-CM

## 2013-09-11 DIAGNOSIS — I059 Rheumatic mitral valve disease, unspecified: Secondary | ICD-10-CM

## 2013-09-11 LAB — LIPID PANEL
CHOL/HDL RATIO: 3
Cholesterol: 125 mg/dL (ref 0–200)
HDL: 47.2 mg/dL (ref >39.00)
LDL CALC: 57 mg/dL (ref 0–99)
NonHDL: 77.8
TRIGLYCERIDES: 104 mg/dL (ref 0.0–149.0)
VLDL: 20.8 mg/dL (ref 0.0–40.0)

## 2013-09-11 LAB — POCT INR: INR: 2.3

## 2013-09-11 LAB — ALT: ALT: 31 U/L (ref 0–53)

## 2013-09-11 MED ORDER — WARFARIN SODIUM 5 MG PO TABS: ORAL_TABLET | ORAL | Status: DC

## 2013-10-04 ENCOUNTER — Encounter: Payer: Self-pay | Admitting: Cardiology

## 2013-10-04 ENCOUNTER — Encounter: Payer: Self-pay | Admitting: General Surgery

## 2013-10-04 ENCOUNTER — Ambulatory Visit (INDEPENDENT_AMBULATORY_CARE_PROVIDER_SITE_OTHER): Payer: Medicare Other | Admitting: Cardiology

## 2013-10-04 ENCOUNTER — Ambulatory Visit (INDEPENDENT_AMBULATORY_CARE_PROVIDER_SITE_OTHER): Payer: Medicare Other | Admitting: Pharmacist

## 2013-10-04 VITALS — BP 122/52 | HR 80 | Ht 67.0 in | Wt 147.0 lb

## 2013-10-04 DIAGNOSIS — I10 Essential (primary) hypertension: Secondary | ICD-10-CM | POA: Diagnosis not present

## 2013-10-04 DIAGNOSIS — Z954 Presence of other heart-valve replacement: Secondary | ICD-10-CM

## 2013-10-04 DIAGNOSIS — I2510 Atherosclerotic heart disease of native coronary artery without angina pectoris: Secondary | ICD-10-CM | POA: Diagnosis not present

## 2013-10-04 DIAGNOSIS — I2589 Other forms of chronic ischemic heart disease: Secondary | ICD-10-CM | POA: Diagnosis not present

## 2013-10-04 DIAGNOSIS — I482 Chronic atrial fibrillation, unspecified: Secondary | ICD-10-CM

## 2013-10-04 DIAGNOSIS — E785 Hyperlipidemia, unspecified: Secondary | ICD-10-CM | POA: Diagnosis not present

## 2013-10-04 DIAGNOSIS — I255 Ischemic cardiomyopathy: Secondary | ICD-10-CM | POA: Insufficient documentation

## 2013-10-04 DIAGNOSIS — I059 Rheumatic mitral valve disease, unspecified: Secondary | ICD-10-CM | POA: Diagnosis not present

## 2013-10-04 DIAGNOSIS — I420 Dilated cardiomyopathy: Secondary | ICD-10-CM | POA: Insufficient documentation

## 2013-10-04 DIAGNOSIS — I2584 Coronary atherosclerosis due to calcified coronary lesion: Secondary | ICD-10-CM | POA: Diagnosis not present

## 2013-10-04 DIAGNOSIS — I4891 Unspecified atrial fibrillation: Secondary | ICD-10-CM | POA: Diagnosis not present

## 2013-10-04 DIAGNOSIS — L989 Disorder of the skin and subcutaneous tissue, unspecified: Secondary | ICD-10-CM

## 2013-10-04 DIAGNOSIS — Z5181 Encounter for therapeutic drug level monitoring: Secondary | ICD-10-CM

## 2013-10-04 LAB — POCT INR: INR: 3

## 2013-10-04 LAB — BASIC METABOLIC PANEL
BUN: 25 mg/dL — ABNORMAL HIGH (ref 6–23)
CALCIUM: 9.9 mg/dL (ref 8.4–10.5)
CO2: 32 mEq/L (ref 19–32)
Chloride: 105 mEq/L (ref 96–112)
Creatinine, Ser: 1.4 mg/dL (ref 0.4–1.5)
GFR: 53.06 mL/min — AB (ref >60.00)
GLUCOSE: 100 mg/dL — AB (ref 70–99)
Potassium: 4.3 mEq/L (ref 3.5–5.1)
SODIUM: 142 meq/L (ref 135–145)

## 2013-10-04 NOTE — Patient Instructions (Addendum)
Your physician recommends that you continue on your current medications as directed. Please refer to the Current Medication list given to you today.  Your physician recommends that you go to the lab today for a BMET  You have an Appointment  tomorrow at your Dermatologist at 10:30. Please arrive ten minutes early to fill out paperwork.   Your physician wants you to follow-up in: 6 months with Dr Mallie Snooks will receive a reminder letter in the mail two months in advance. If you Azekiel't receive a letter, please call our office to schedule the follow-up appointment.

## 2013-10-04 NOTE — Progress Notes (Signed)
[place] [place], [place]  [address] Phone: [phone] Fax:  [phone]  Date:  10/04/2013   ID:  Alfred Dennis, DOB [birth year], MRN [id number]  PCP:  Osborne Casco, MD  Cardiologist:  Fransico Him, MD     History of Present Illness: Alfred Dennis is a 79 y.o. male with a history of severe MR s/p MVR, HTN, dyslipidemia, chronic atrial fibrillation, CAD s/p CABG and ischemic dilated CM with EF 35-30% at time of his colon surgery a year ago. He denies any chest pain. He has chronic DOE which is unchanged. He denies any palpitations, dizziness or syncope. He occasionally has some mild RLE edema which is chronic.    Wt Readings from Last 3 Encounters:  10/04/13 147 lb (66.679 kg)  07/07/13 153 lb 1.6 oz (69.446 kg)  01/04/13 148 lb 4.8 oz (67.268 kg)     Past Medical History  Diagnosis Date  . Hypertension   . Shortness of breath     "w/any activity lately" (02/03/2012)  . CHF (congestive heart failure)     mild/note 02/03/2012  . Arthritis     "left foot; fingers" (02/03/2012)  . ARF (acute renal failure)   . GIB (gastrointestinal bleeding)   . Chronic atrial fibrillation   . Non-sustained ventricular tachycardia   . Chronic anticoagulation   . Coronary artery disease 05/2000    s/p CABG with LIMA to LAD, SVG to IM, SVG to OM  . Mitral valvular regurgitation     s/p MVR with mechanical valve  . Ischemic dilated cardiomyopathy     EF 25-30% by echo 01/2012 at time of colon surgery  . Hyperlipidemia   . Stroke     after CABG  . Carcinoma of colon   . Basal cell carcinoma of shoulder 10/2010    Current Outpatient Prescriptions  Medication Sig Dispense Refill  . furosemide (LASIX) 20 MG tablet Take 60 mg by mouth daily.      . hydrALAZINE (APRESOLINE) 25 MG tablet Take by mouth 3 (three) times daily. Take 1 and 1/2 tablets three times daily      . metoprolol (LOPRESSOR) 50 MG tablet Take 100 mg by mouth 2 (two) times daily.      .  Multiple Vitamin (MULTIVITAMIN WITH MINERALS) TABS Take 1 tablet by mouth daily.      Marland Kitchen omega-3 acid ethyl esters (LOVAZA) 1 G capsule Take 1 g by mouth 3 (three) times daily.      . simvastatin (ZOCOR) 10 MG tablet Take 10 mg by mouth at bedtime.      Marland Kitchen warfarin (COUMADIN) 5 MG tablet Take as directed by Coumadin Clinic  30 tablet  3   No current facility-administered medications for this visit.    Allergies:   No Known Allergies  Social History:  The patient  reports that he quit smoking about 33 years ago. His smoking use included Cigarettes. He has a 20 pack-year smoking history. He has never used smokeless tobacco. He reports that he does not drink alcohol or use illicit drugs.   Family History:  The patient's family history includes Heart disease in his mother.   ROS:  Please see the history of present illness.      All other systems reviewed and negative.   PHYSICAL EXAM: VS:  BP 122/52  Pulse 80  Ht 5\' 7"  (1.702 m)  Wt 147 lb (66.679 kg)  BMI 23.02 kg/m2  SpO2 99% Well nourished, well developed,  in no acute distress HEENT: normal Neck: no JVD, open lesion under right ear Cardiac:  normal S1, S2; irregularly irregular; no murmur Lungs:  clear to auscultation bilaterally, no wheezing, rhonchi or rales Abd: soft, nontender, no hepatomegaly Ext: trace edema Skin: warm and dry Neuro:  CNs 2-12 intact, no focal abnormalities note     ASSESSMENT AND PLAN: 1. Chronic atrial fibrillation rate controlled - continue Warfarin and metoprolol  2. Chronic systemic anticoagulation 3. HTN - well controlled - continue Hydralazine/metoprolol  4. Dyslipidemia - LDL at goal - continue simvastatin  5. CAD s/p CABG with no angina 6. Ischemic dilated CM - well compensated - continue Lasix  - BMET       7.  Skin lesion under right ear - will refer to Dermatology  Followup with me in 6 months   Signed, Fransico Him, MD 10/04/2013 1:31 PM

## 2013-10-05 DIAGNOSIS — D485 Neoplasm of uncertain behavior of skin: Secondary | ICD-10-CM | POA: Diagnosis not present

## 2013-10-05 DIAGNOSIS — Z85828 Personal history of other malignant neoplasm of skin: Secondary | ICD-10-CM | POA: Diagnosis not present

## 2013-10-05 DIAGNOSIS — L821 Other seborrheic keratosis: Secondary | ICD-10-CM | POA: Diagnosis not present

## 2013-10-06 DIAGNOSIS — C4442 Squamous cell carcinoma of skin of scalp and neck: Secondary | ICD-10-CM | POA: Diagnosis not present

## 2013-10-18 ENCOUNTER — Ambulatory Visit (INDEPENDENT_AMBULATORY_CARE_PROVIDER_SITE_OTHER): Payer: Medicare Other | Admitting: Pharmacist

## 2013-10-18 DIAGNOSIS — Z954 Presence of other heart-valve replacement: Secondary | ICD-10-CM

## 2013-10-18 DIAGNOSIS — I059 Rheumatic mitral valve disease, unspecified: Secondary | ICD-10-CM

## 2013-10-18 DIAGNOSIS — Z5181 Encounter for therapeutic drug level monitoring: Secondary | ICD-10-CM | POA: Diagnosis not present

## 2013-10-18 LAB — POCT INR: INR: 3.1

## 2013-11-09 ENCOUNTER — Ambulatory Visit (INDEPENDENT_AMBULATORY_CARE_PROVIDER_SITE_OTHER): Payer: Medicare Other

## 2013-11-09 DIAGNOSIS — I059 Rheumatic mitral valve disease, unspecified: Secondary | ICD-10-CM

## 2013-11-09 DIAGNOSIS — Z954 Presence of other heart-valve replacement: Secondary | ICD-10-CM

## 2013-11-09 DIAGNOSIS — Z5181 Encounter for therapeutic drug level monitoring: Secondary | ICD-10-CM | POA: Diagnosis not present

## 2013-11-09 LAB — POCT INR: INR: 3

## 2013-11-14 DIAGNOSIS — C4442 Squamous cell carcinoma of skin of scalp and neck: Secondary | ICD-10-CM | POA: Diagnosis not present

## 2013-11-14 DIAGNOSIS — D044 Carcinoma in situ of skin of scalp and neck: Secondary | ICD-10-CM | POA: Diagnosis not present

## 2013-12-21 ENCOUNTER — Ambulatory Visit (INDEPENDENT_AMBULATORY_CARE_PROVIDER_SITE_OTHER): Payer: Medicare Other | Admitting: *Deleted

## 2013-12-21 DIAGNOSIS — Z954 Presence of other heart-valve replacement: Secondary | ICD-10-CM

## 2013-12-21 DIAGNOSIS — I059 Rheumatic mitral valve disease, unspecified: Secondary | ICD-10-CM | POA: Diagnosis not present

## 2013-12-21 DIAGNOSIS — Z5181 Encounter for therapeutic drug level monitoring: Secondary | ICD-10-CM

## 2013-12-21 DIAGNOSIS — Z952 Presence of prosthetic heart valve: Secondary | ICD-10-CM

## 2013-12-21 LAB — POCT INR: INR: 2.4

## 2013-12-26 DIAGNOSIS — Z23 Encounter for immunization: Secondary | ICD-10-CM | POA: Diagnosis not present

## 2014-01-11 ENCOUNTER — Other Ambulatory Visit: Payer: Medicare Other

## 2014-01-11 ENCOUNTER — Telehealth: Payer: Self-pay | Admitting: Oncology

## 2014-01-11 ENCOUNTER — Ambulatory Visit: Payer: Medicare Other | Admitting: Nurse Practitioner

## 2014-01-11 NOTE — Telephone Encounter (Signed)
PATIENT CALLED TO R/S APPT FOR 11/05 LABS @ 10:15, 10:45 LT

## 2014-01-11 NOTE — Telephone Encounter (Signed)
S/W PATIENT AND GAVE APPT FOR 11/09 @ 2 LABS, 2:45 LT

## 2014-01-15 ENCOUNTER — Telehealth: Payer: Self-pay | Admitting: Nurse Practitioner

## 2014-01-15 ENCOUNTER — Ambulatory Visit (HOSPITAL_BASED_OUTPATIENT_CLINIC_OR_DEPARTMENT_OTHER): Payer: Medicare Other | Admitting: Nurse Practitioner

## 2014-01-15 ENCOUNTER — Other Ambulatory Visit: Payer: Medicare Other

## 2014-01-15 VITALS — BP 143/60 | HR 60 | Temp 97.6°F | Resp 17 | Ht 67.0 in | Wt 151.0 lb

## 2014-01-15 DIAGNOSIS — I509 Heart failure, unspecified: Secondary | ICD-10-CM

## 2014-01-15 DIAGNOSIS — I4891 Unspecified atrial fibrillation: Secondary | ICD-10-CM

## 2014-01-15 DIAGNOSIS — C189 Malignant neoplasm of colon, unspecified: Secondary | ICD-10-CM

## 2014-01-15 DIAGNOSIS — C184 Malignant neoplasm of transverse colon: Secondary | ICD-10-CM | POA: Diagnosis not present

## 2014-01-15 DIAGNOSIS — D509 Iron deficiency anemia, unspecified: Secondary | ICD-10-CM

## 2014-01-15 NOTE — Progress Notes (Signed)
  [facility] OFFICE PROGRESS NOTE   Diagnosis:  Colon cancer  INTERVAL HISTORY:   Alfred Dennis returns as scheduled. No change in bowel habits. No bloody or black stools. He denies abdominal pain. No nausea or vomiting. He denies shortness breath, cough and fever. He reports a good appetite.  Objective:  Vital signs in last 24 hours:  Blood pressure 143/60, pulse 60, temperature 97.6 F (36.4 C), temperature source Oral, resp. rate 17, height 5\' 7"  (1.702 m), weight 151 lb (68.493 kg).    HEENT: no thrush or ulcers. Poor dentition. Lymphatics: no palpable cervical, supraclavicular, axillary or inguinal lymph nodes. Resp: inspiratory rales at the left lung base. No respiratory distress. Cardio: irregular. GI: abdomen soft and nontender. No organomegaly. Reducible left inguinal hernia. Vascular: 2+ pitting edema below the knees bilaterally.   Lab Results:  Lab Results  Component Value Date   WBC 9.3 07/07/2013   HGB 12.2* 07/07/2013   HCT 38.0* 07/07/2013   MCV 91.5 07/07/2013   PLT 178 07/07/2013   NEUTROABS 6.3 07/07/2013  CEA pending.  Imaging:  No results found.  Medications: I have reviewed the patient's current medications.  Assessment/Plan: 1. Adenocarcinoma of the transverse colon (T3 N1) status post extended right colectomy on 02/10/2012. 2. Congestive heart failure. 3. Atrial fibrillation. 4. Status post mitral valve replacement. 5. History of iron deficiency anemia. 6. Multiple tubular adenomas of the colon. 7. Left inguinal hernia   Disposition: Alfred Dennis remains in clinical remission from colon cancer. We will followup on the CEA from today. He will return for a followup visit and CEA in 6 months. He will contact the office in the interim with any problems.  Plan reviewed with Dr. Benay Spice.    Ned Card ANP/GNP-BC   01/15/2014  2:26 PM

## 2014-01-15 NOTE — Telephone Encounter (Signed)
Gave avs & cal for May 2016. °

## 2014-01-16 LAB — CEA: CEA: 2.4 ng/mL (ref 0.0–5.0)

## 2014-01-17 ENCOUNTER — Telehealth: Payer: Self-pay | Admitting: *Deleted

## 2014-01-17 NOTE — Telephone Encounter (Signed)
Called and informed patient of normal cea.  Per Dr. Sherrill.  Patient verbalized understanding.  

## 2014-01-17 NOTE — Telephone Encounter (Signed)
-----   Message from Ladell Pier, MD sent at 01/16/2014  6:39 PM EST ----- Please call patient, cea is normal

## 2014-01-18 ENCOUNTER — Ambulatory Visit (INDEPENDENT_AMBULATORY_CARE_PROVIDER_SITE_OTHER): Payer: Medicare Other | Admitting: *Deleted

## 2014-01-18 DIAGNOSIS — I059 Rheumatic mitral valve disease, unspecified: Secondary | ICD-10-CM

## 2014-01-18 DIAGNOSIS — Z5181 Encounter for therapeutic drug level monitoring: Secondary | ICD-10-CM

## 2014-01-18 DIAGNOSIS — Z952 Presence of prosthetic heart valve: Secondary | ICD-10-CM

## 2014-01-18 DIAGNOSIS — Z954 Presence of other heart-valve replacement: Secondary | ICD-10-CM

## 2014-01-18 LAB — POCT INR: INR: 3.1

## 2014-02-15 ENCOUNTER — Ambulatory Visit (INDEPENDENT_AMBULATORY_CARE_PROVIDER_SITE_OTHER): Payer: Medicare Other | Admitting: Pharmacist

## 2014-02-15 DIAGNOSIS — Z954 Presence of other heart-valve replacement: Secondary | ICD-10-CM

## 2014-02-15 DIAGNOSIS — I059 Rheumatic mitral valve disease, unspecified: Secondary | ICD-10-CM | POA: Diagnosis not present

## 2014-02-15 DIAGNOSIS — Z952 Presence of prosthetic heart valve: Secondary | ICD-10-CM

## 2014-02-15 DIAGNOSIS — Z5181 Encounter for therapeutic drug level monitoring: Secondary | ICD-10-CM | POA: Diagnosis not present

## 2014-02-15 LAB — POCT INR: INR: 3.1

## 2014-03-09 ENCOUNTER — Encounter (HOSPITAL_COMMUNITY): Payer: Self-pay

## 2014-03-09 ENCOUNTER — Emergency Department (HOSPITAL_COMMUNITY): Payer: Medicare Other

## 2014-03-09 ENCOUNTER — Inpatient Hospital Stay (HOSPITAL_COMMUNITY)
Admission: EM | Admit: 2014-03-09 | Discharge: 2014-03-14 | DRG: 871 | Disposition: A | Discharge status: Skilled Nursing Facility | Payer: Medicare Other | Attending: Internal Medicine | Admitting: Internal Medicine

## 2014-03-09 DIAGNOSIS — I10 Essential (primary) hypertension: Secondary | ICD-10-CM | POA: Diagnosis present

## 2014-03-09 DIAGNOSIS — R6520 Severe sepsis without septic shock: Secondary | ICD-10-CM | POA: Diagnosis not present

## 2014-03-09 DIAGNOSIS — T501X5A Adverse effect of loop [high-ceiling] diuretics, initial encounter: Secondary | ICD-10-CM | POA: Diagnosis present

## 2014-03-09 DIAGNOSIS — Z7901 Long term (current) use of anticoagulants: Secondary | ICD-10-CM | POA: Diagnosis not present

## 2014-03-09 DIAGNOSIS — Z87891 Personal history of nicotine dependence: Secondary | ICD-10-CM | POA: Diagnosis not present

## 2014-03-09 DIAGNOSIS — R531 Weakness: Secondary | ICD-10-CM | POA: Diagnosis not present

## 2014-03-09 DIAGNOSIS — Z951 Presence of aortocoronary bypass graft: Secondary | ICD-10-CM | POA: Diagnosis not present

## 2014-03-09 DIAGNOSIS — I482 Chronic atrial fibrillation: Secondary | ICD-10-CM | POA: Diagnosis present

## 2014-03-09 DIAGNOSIS — M1990 Unspecified osteoarthritis, unspecified site: Secondary | ICD-10-CM | POA: Diagnosis present

## 2014-03-09 DIAGNOSIS — Z8249 Family history of ischemic heart disease and other diseases of the circulatory system: Secondary | ICD-10-CM | POA: Diagnosis not present

## 2014-03-09 DIAGNOSIS — Z85828 Personal history of other malignant neoplasm of skin: Secondary | ICD-10-CM | POA: Diagnosis not present

## 2014-03-09 DIAGNOSIS — E872 Acidosis: Secondary | ICD-10-CM | POA: Diagnosis present

## 2014-03-09 DIAGNOSIS — N183 Chronic kidney disease, stage 3 unspecified: Secondary | ICD-10-CM | POA: Diagnosis present

## 2014-03-09 DIAGNOSIS — I2510 Atherosclerotic heart disease of native coronary artery without angina pectoris: Secondary | ICD-10-CM | POA: Diagnosis present

## 2014-03-09 DIAGNOSIS — Z954 Presence of other heart-valve replacement: Secondary | ICD-10-CM

## 2014-03-09 DIAGNOSIS — Y92000 Kitchen of unspecified non-institutional (private) residence as  the place of occurrence of the external cause: Secondary | ICD-10-CM | POA: Diagnosis not present

## 2014-03-09 DIAGNOSIS — Z85038 Personal history of other malignant neoplasm of large intestine: Secondary | ICD-10-CM | POA: Diagnosis not present

## 2014-03-09 DIAGNOSIS — S0990XA Unspecified injury of head, initial encounter: Secondary | ICD-10-CM | POA: Diagnosis not present

## 2014-03-09 DIAGNOSIS — I517 Cardiomegaly: Secondary | ICD-10-CM | POA: Diagnosis not present

## 2014-03-09 DIAGNOSIS — N179 Acute kidney failure, unspecified: Secondary | ICD-10-CM

## 2014-03-09 DIAGNOSIS — I4729 Other ventricular tachycardia: Secondary | ICD-10-CM

## 2014-03-09 DIAGNOSIS — Z66 Do not resuscitate: Secondary | ICD-10-CM | POA: Diagnosis present

## 2014-03-09 DIAGNOSIS — I129 Hypertensive chronic kidney disease with stage 1 through stage 4 chronic kidney disease, or unspecified chronic kidney disease: Secondary | ICD-10-CM | POA: Diagnosis present

## 2014-03-09 DIAGNOSIS — R404 Transient alteration of awareness: Secondary | ICD-10-CM | POA: Diagnosis not present

## 2014-03-09 DIAGNOSIS — R278 Other lack of coordination: Secondary | ICD-10-CM | POA: Diagnosis not present

## 2014-03-09 DIAGNOSIS — I5022 Chronic systolic (congestive) heart failure: Secondary | ICD-10-CM

## 2014-03-09 DIAGNOSIS — I255 Ischemic cardiomyopathy: Secondary | ICD-10-CM | POA: Diagnosis present

## 2014-03-09 DIAGNOSIS — A419 Sepsis, unspecified organism: Principal | ICD-10-CM | POA: Diagnosis present

## 2014-03-09 DIAGNOSIS — I420 Dilated cardiomyopathy: Secondary | ICD-10-CM

## 2014-03-09 DIAGNOSIS — Z8673 Personal history of transient ischemic attack (TIA), and cerebral infarction without residual deficits: Secondary | ICD-10-CM

## 2014-03-09 DIAGNOSIS — W19XXXA Unspecified fall, initial encounter: Secondary | ICD-10-CM

## 2014-03-09 DIAGNOSIS — M6281 Muscle weakness (generalized): Secondary | ICD-10-CM | POA: Diagnosis not present

## 2014-03-09 DIAGNOSIS — I472 Ventricular tachycardia: Secondary | ICD-10-CM | POA: Diagnosis not present

## 2014-03-09 DIAGNOSIS — L03115 Cellulitis of right lower limb: Secondary | ICD-10-CM | POA: Diagnosis present

## 2014-03-09 DIAGNOSIS — E876 Hypokalemia: Secondary | ICD-10-CM | POA: Diagnosis not present

## 2014-03-09 DIAGNOSIS — E785 Hyperlipidemia, unspecified: Secondary | ICD-10-CM | POA: Diagnosis present

## 2014-03-09 DIAGNOSIS — L0390 Cellulitis, unspecified: Secondary | ICD-10-CM

## 2014-03-09 DIAGNOSIS — I2584 Coronary atherosclerosis due to calcified coronary lesion: Secondary | ICD-10-CM | POA: Diagnosis not present

## 2014-03-09 DIAGNOSIS — L0291 Cutaneous abscess, unspecified: Secondary | ICD-10-CM | POA: Diagnosis not present

## 2014-03-09 DIAGNOSIS — Z952 Presence of prosthetic heart valve: Secondary | ICD-10-CM

## 2014-03-09 DIAGNOSIS — I5043 Acute on chronic combined systolic (congestive) and diastolic (congestive) heart failure: Secondary | ICD-10-CM | POA: Diagnosis present

## 2014-03-09 DIAGNOSIS — K922 Gastrointestinal hemorrhage, unspecified: Secondary | ICD-10-CM | POA: Diagnosis present

## 2014-03-09 DIAGNOSIS — I369 Nonrheumatic tricuspid valve disorder, unspecified: Secondary | ICD-10-CM | POA: Diagnosis not present

## 2014-03-09 DIAGNOSIS — B999 Unspecified infectious disease: Secondary | ICD-10-CM | POA: Diagnosis not present

## 2014-03-09 DIAGNOSIS — W1830XA Fall on same level, unspecified, initial encounter: Secondary | ICD-10-CM | POA: Diagnosis present

## 2014-03-09 DIAGNOSIS — J811 Chronic pulmonary edema: Secondary | ICD-10-CM | POA: Diagnosis not present

## 2014-03-09 DIAGNOSIS — Z79899 Other long term (current) drug therapy: Secondary | ICD-10-CM | POA: Diagnosis not present

## 2014-03-09 LAB — CBC WITH DIFFERENTIAL/PLATELET
Basophils Absolute: 0 10*3/uL (ref 0.0–0.1)
Basophils Relative: 0 % (ref 0–1)
EOS ABS: 0 10*3/uL (ref 0.0–0.7)
EOS PCT: 0 % (ref 0–5)
HEMATOCRIT: 37.7 % — AB (ref 39.0–52.0)
HEMOGLOBIN: 12.3 g/dL — AB (ref 13.0–17.0)
LYMPHS ABS: 0.5 10*3/uL — AB (ref 0.7–4.0)
LYMPHS PCT: 3 % — AB (ref 12–46)
MCH: 30.1 pg (ref 26.0–34.0)
MCHC: 32.6 g/dL (ref 30.0–36.0)
MCV: 92.2 fL (ref 78.0–100.0)
MONO ABS: 0.9 10*3/uL (ref 0.1–1.0)
Monocytes Relative: 6 % (ref 3–12)
Neutro Abs: 13 10*3/uL — ABNORMAL HIGH (ref 1.7–7.7)
Neutrophils Relative %: 91 % — ABNORMAL HIGH (ref 43–77)
Platelets: 145 10*3/uL — ABNORMAL LOW (ref 150–400)
RBC: 4.09 MIL/uL — AB (ref 4.22–5.81)
RDW: 14.9 % (ref 11.5–15.5)
WBC: 14.4 10*3/uL — AB (ref 4.0–10.5)

## 2014-03-09 LAB — URINE MICROSCOPIC-ADD ON

## 2014-03-09 LAB — TROPONIN I: Troponin I: 0.05 ng/mL — ABNORMAL HIGH (ref <0.031)

## 2014-03-09 LAB — COMPREHENSIVE METABOLIC PANEL
ALK PHOS: 92 U/L (ref 39–117)
ALT: 56 U/L — ABNORMAL HIGH (ref 0–53)
ANION GAP: 9 (ref 5–15)
AST: 111 U/L — ABNORMAL HIGH (ref 0–37)
Albumin: 3.8 g/dL (ref 3.5–5.2)
BILIRUBIN TOTAL: 1.1 mg/dL (ref 0.3–1.2)
BUN: 24 mg/dL — ABNORMAL HIGH (ref 6–23)
CALCIUM: 9.3 mg/dL (ref 8.4–10.5)
CHLORIDE: 106 meq/L (ref 96–112)
CO2: 22 mmol/L (ref 19–32)
Creatinine, Ser: 1.51 mg/dL — ABNORMAL HIGH (ref 0.50–1.35)
GFR calc Af Amer: 46 mL/min — ABNORMAL LOW (ref >90)
GFR, EST NON AFRICAN AMERICAN: 39 mL/min — AB (ref >90)
GLUCOSE: 103 mg/dL — AB (ref 70–99)
POTASSIUM: 5.1 mmol/L (ref 3.5–5.1)
Sodium: 137 mmol/L (ref 135–145)
Total Protein: 6.8 g/dL (ref 6.0–8.3)

## 2014-03-09 LAB — PROTIME-INR
INR: 3.1 — ABNORMAL HIGH (ref 0.00–1.49)
Prothrombin Time: 32.2 seconds — ABNORMAL HIGH (ref 11.6–15.2)

## 2014-03-09 LAB — URINALYSIS, ROUTINE W REFLEX MICROSCOPIC
BILIRUBIN URINE: NEGATIVE
GLUCOSE, UA: NEGATIVE mg/dL
Hgb urine dipstick: NEGATIVE
Ketones, ur: NEGATIVE mg/dL
LEUKOCYTES UA: NEGATIVE
NITRITE: NEGATIVE
Protein, ur: 30 mg/dL — AB
Specific Gravity, Urine: 1.013 (ref 1.005–1.030)
Urobilinogen, UA: 1 mg/dL (ref 0.0–1.0)
pH: 5.5 (ref 5.0–8.0)

## 2014-03-09 LAB — I-STAT CG4 LACTIC ACID, ED
LACTIC ACID, VENOUS: 1.03 mmol/L (ref 0.5–2.2)
Lactic Acid, Venous: 2.64 mmol/L — ABNORMAL HIGH (ref 0.5–2.2)

## 2014-03-09 MED ORDER — METOPROLOL TARTRATE 100 MG PO TABS
100.0000 mg | ORAL_TABLET | Freq: Two times a day (BID) | ORAL | Status: DC
  Administered 2014-03-10 – 2014-03-12 (×5): 100 mg via ORAL
  Filled 2014-03-09 (×6): qty 1

## 2014-03-09 MED ORDER — ACETAMINOPHEN 325 MG PO TABS: 650.0000 mg | ORAL_TABLET | Freq: Four times a day (QID) | ORAL | Status: DC | PRN

## 2014-03-09 MED ORDER — WARFARIN SODIUM 5 MG PO TABS
5.0000 mg | ORAL_TABLET | ORAL | Status: DC
  Administered 2014-03-11: 5 mg via ORAL
  Filled 2014-03-09: qty 1

## 2014-03-09 MED ORDER — SODIUM CHLORIDE 0.9 % IV SOLN
1000.0000 mL | INTRAVENOUS | Status: DC
  Administered 2014-03-09: 1000 mL via INTRAVENOUS

## 2014-03-09 MED ORDER — WARFARIN - PHARMACIST DOSING INPATIENT
Freq: Every day | Status: DC
  Administered 2014-03-11 – 2014-03-13 (×3)

## 2014-03-09 MED ORDER — ACETAMINOPHEN 650 MG RE SUPP: 650.0000 mg | Freq: Four times a day (QID) | RECTAL | Status: DC | PRN

## 2014-03-09 MED ORDER — FUROSEMIDE 10 MG/ML IJ SOLN
40.0000 mg | INTRAMUSCULAR | Status: AC
  Administered 2014-03-09: 40 mg via INTRAVENOUS
  Filled 2014-03-09: qty 4

## 2014-03-09 MED ORDER — SODIUM CHLORIDE 0.9 % IJ SOLN
3.0000 mL | Freq: Two times a day (BID) | INTRAMUSCULAR | Status: DC
  Administered 2014-03-10 – 2014-03-14 (×10): 3 mL via INTRAVENOUS

## 2014-03-09 MED ORDER — PIPERACILLIN-TAZOBACTAM 3.375 G IVPB: 3.3750 g | Freq: Three times a day (TID) | INTRAVENOUS | Status: DC

## 2014-03-09 MED ORDER — ONDANSETRON HCL 4 MG/2ML IJ SOLN: 4.0000 mg | Freq: Four times a day (QID) | INTRAMUSCULAR | Status: DC | PRN

## 2014-03-09 MED ORDER — ONDANSETRON HCL 4 MG PO TABS: 4.0000 mg | ORAL_TABLET | Freq: Four times a day (QID) | ORAL | Status: DC | PRN

## 2014-03-09 MED ORDER — SIMVASTATIN 10 MG PO TABS
10.0000 mg | ORAL_TABLET | Freq: Every day | ORAL | Status: DC
  Administered 2014-03-10 – 2014-03-13 (×5): 10 mg via ORAL
  Filled 2014-03-09 (×6): qty 1

## 2014-03-09 MED ORDER — VANCOMYCIN HCL IN DEXTROSE 1-5 GM/200ML-% IV SOLN
1000.0000 mg | Freq: Once | INTRAVENOUS | Status: AC
  Administered 2014-03-10: 1000 mg via INTRAVENOUS
  Filled 2014-03-09: qty 200

## 2014-03-09 MED ORDER — ACETAMINOPHEN 325 MG PO TABS
650.0000 mg | ORAL_TABLET | Freq: Once | ORAL | Status: AC
  Administered 2014-03-09: 650 mg via ORAL
  Filled 2014-03-09: qty 2

## 2014-03-09 MED ORDER — VANCOMYCIN HCL IN DEXTROSE 1-5 GM/200ML-% IV SOLN
1000.0000 mg | Freq: Once | INTRAVENOUS | Status: AC
  Administered 2014-03-09: 1000 mg via INTRAVENOUS
  Filled 2014-03-09: qty 200

## 2014-03-09 MED ORDER — WARFARIN SODIUM 2.5 MG PO TABS
2.5000 mg | ORAL_TABLET | ORAL | Status: DC
  Administered 2014-03-10 – 2014-03-13 (×4): 2.5 mg via ORAL
  Filled 2014-03-09 (×5): qty 1

## 2014-03-09 MED ORDER — VANCOMYCIN HCL 500 MG IV SOLR
500.0000 mg | Freq: Once | INTRAVENOUS | Status: AC
  Administered 2014-03-09: 500 mg via INTRAVENOUS
  Filled 2014-03-09 (×2): qty 500

## 2014-03-09 MED ORDER — PIPERACILLIN-TAZOBACTAM 3.375 G IVPB 30 MIN: 3.3750 g | Freq: Once | INTRAVENOUS | Status: DC

## 2014-03-09 NOTE — H&P (Signed)
[facility] History and Physical  Patient: Alfred Dennis  CXK:[id number]  DOB: 05-12-1923  DOS: the patient was seen and examined on 03/09/2014 PCP: Osborne Casco, MD  Chief Complaint: weakness and fall  HPI: Alfred Dennis is a 81 y.o. male with Past medical history of hypertension, chronic systolic heart failure, chronic A. fib, coronary artery disease status post CABG, mitral wall replacement with mechanical fall, basal cell carcinoma. The patient presented with generalized weakness and a near fall. He mentions that he was working in the kitchen and started to feeling weak and slid down the floor. He did not hit his head did not have any loss of consciousness. He denies any chest pain or shortness of breath. Denies any nausea or vomiting. Denies any diarrhea or constipation or burning urination. He has chronic leg swellings and has been taking Lasix and started noticing some redness for last few days. He lives with his wife who has dementia and he is the primary caretaker for his wife. He has seen podiatry in the past but has not seen them for last many years. He walks without any support at home. He denies any pain in his hips or knee.   The patient is coming from home. And at his baseline independent for most of his ADL.  Review of Systems: as mentioned in the history of present illness.  A Comprehensive review of the other systems is negative.  Past Medical History  Diagnosis Date  . Hypertension   . Shortness of breath     "w/any activity lately" (02/03/2012)  . CHF (congestive heart failure)     mild/note 02/03/2012  . Arthritis     "left foot; fingers" (02/03/2012)  . ARF (acute renal failure)   . GIB (gastrointestinal bleeding)   . Chronic atrial fibrillation   . Non-sustained ventricular tachycardia   . Chronic anticoagulation   . Coronary artery disease 05/2000    s/p CABG with LIMA to LAD, SVG to IM, SVG to OM  . Mitral valvular regurgitation    s/p MVR with mechanical valve  . Ischemic dilated cardiomyopathy     EF 25-30% by echo 01/2012 at time of colon surgery  . Hyperlipidemia   . Stroke     after CABG  . Carcinoma of colon   . Basal cell carcinoma of shoulder 10/2010   Past Surgical History  Procedure Laterality Date  . Inguinal hernia repair      right  . Cataract extraction w/ intraocular lens  implant, bilateral  ~ 2011  . Valve replacement  05/2000    St. Jude 59mm mechanical valve  . Cardiac valve replacement  05/2000    St. Jude 40mm  . Esophagogastroduodenoscopy  02/08/2012    Procedure: ESOPHAGOGASTRODUODENOSCOPY (EGD);  Surgeon: Gatha Mayer, MD;  Location: [facility] ENDOSCOPY;  Service: Endoscopy;  Laterality: N/A;  . Colonoscopy  02/08/2012    Procedure: COLONOSCOPY;  Surgeon: Gatha Mayer, MD;  Location: [place];  Service: Endoscopy;  Laterality: N/A;  . Laparoscopic partial colectomy  02/10/2012    Procedure: LAPAROSCOPIC PARTIAL COLECTOMY;  Surgeon: Stark Klein, MD;  Location: [place];  Service: General;  Laterality: N/A;  . Coronary artery bypass graft      LIMA to LAD, SVG to IM, SVG to OM2   Social History:  reports that he quit smoking about 34 years ago. His smoking use included Cigarettes. He has a 20 pack-year smoking history. He has never used smokeless tobacco. He reports that he  does not drink alcohol or use illicit drugs.  No Known Allergies  Family History  Problem Relation Age of Onset  . Heart disease Mother     Prior to Admission medications   Medication Sig Start Date End Date Taking? Authorizing Provider  furosemide (LASIX) 20 MG tablet Take 20 mg by mouth 3 (three) times daily.   Yes Historical Provider, MD  furosemide (LASIX) 40 MG tablet Take 40 mg by mouth 3 (three) times daily. Take three times a day per patient   Yes Historical Provider, MD  hydrALAZINE (APRESOLINE) 25 MG tablet Take 25 mg by mouth 3 (three) times daily.    Yes Historical Provider, MD  metoprolol (LOPRESSOR) 50 MG  tablet Take 100 mg by mouth 2 (two) times daily.   Yes Historical Provider, MD  Multiple Vitamin (MULTIVITAMIN WITH MINERALS) TABS Take 1 tablet by mouth daily.   Yes Historical Provider, MD  omega-3 acid ethyl esters (LOVAZA) 1 G capsule Take 1 g by mouth 3 (three) times daily.   Yes Historical Provider, MD  simvastatin (ZOCOR) 10 MG tablet Take 10 mg by mouth at bedtime.   Yes Historical Provider, MD  warfarin (COUMADIN) 5 MG tablet Take 2.5-5 mg by mouth daily. Take whole pill on sundays and thursdays and half pill rest of days   Yes Historical Provider, MD  furosemide (LASIX) 20 MG tablet Take 60 mg by mouth daily.    Historical Provider, MD  warfarin (COUMADIN) 5 MG tablet Take as directed by [facility] Patient not taking: Reported on 03/09/2014 09/11/13   Sueanne Margarita, MD    Physical Exam: Filed Vitals:   03/09/14 2230 03/09/14 2245 03/09/14 2246 03/09/14 2300  BP: 121/57 109/47  120/53  Pulse: 84 72  65  Temp:   98.3 F (36.8 C)   TempSrc:   Oral   Resp: 18 17  17   Height:      Weight:      SpO2: 97% 95%  96%    General: Alert, Awake and Oriented to Time, Place and Person. Appear in mild distress Eyes: PERRL ENT: Oral Mucosa clear moist. Neck: Difficult to assess JVD Cardiovascular: S1 and S2 Present, aortic systolic Murmur, Peripheral Pulses Present Respiratory: Bilateral Air entry equal and Decreased, Clear to Auscultation, no Crackles,  no  wheezes Abdomen: Bowel Sound  present , Soft and  non- tender Skin:  no  Rash, redness involving right leg  Extremities: Bilateral Pedal edema,  no calf tenderness Neurologic: Grossly no focal neuro deficit.  Labs on Admission:  CBC:  Recent Labs Lab 03/09/14 1945  WBC 14.4*  NEUTROABS 13.0*  HGB 12.3*  HCT 37.7*  MCV 92.2  PLT 145*    CMP     Component Value Date/Time   NA 137 03/09/2014 1945   K 5.1 03/09/2014 1945   CL 106 03/09/2014 1945   CO2 22 03/09/2014 1945   GLUCOSE 103* 03/09/2014 1945   BUN 24*  03/09/2014 1945   CREATININE 1.51* 03/09/2014 1945   CALCIUM 9.3 03/09/2014 1945   PROT 6.8 03/09/2014 1945   ALBUMIN 3.8 03/09/2014 1945   AST 111* 03/09/2014 1945   ALT 56* 03/09/2014 1945   ALKPHOS 92 03/09/2014 1945   BILITOT 1.1 03/09/2014 1945   GFRNONAA 39* 03/09/2014 1945   GFRAA 46* 03/09/2014 1945    No results for input(s): LIPASE, AMYLASE in the last 168 hours. No results for input(s): AMMONIA in the last 168 hours.   Recent Labs Lab  03/09/14 1945  TROPONINI 0.05*   BNP (last 3 results) No results for input(s): PROBNP in the last 8760 hours.  Radiological Exams on Admission: Dg Chest Port 1 View  03/09/2014   CLINICAL DATA:  Acute onset of generalized weakness for 1 day. Initial encounter.  EXAM: PORTABLE CHEST - 1 VIEW  COMPARISON:  Chest radiograph performed 02/12/2012  FINDINGS: Vascular congestion is noted. Minimal bilateral opacities could reflect minimal interstitial edema or possibly a mild infectious process. There is elevation of the left hemidiaphragm. No pleural effusion or pneumothorax is seen.  The cardiomediastinal silhouette remains significantly enlarged. The patient is status post median sternotomy, with evidence of prior CABG. A valve replacement is noted. No acute osseous abnormalities are identified.  IMPRESSION: Vascular congestion and cardiomegaly noted. Minimal bilateral opacities could reflect minimal interstitial edema or possibly a mild infectious process, depending on the patient's symptoms. Stable chronic elevation of the left hemidiaphragm.   Electronically Signed   By: Garald Balding M.D.   On: 03/09/2014 19:50    EKG: Independently reviewed. atrial fibrillation, rate controlled with occasional PVC.  Assessment/Plan Principal Problem:   Sepsis due to cellulitis Active Problems:   Chronic a-fib   S/P MVR (mitral valve replacement)   CAD (coronary artery disease)   Chronic systolic heart failure   AKI (acute kidney injury)   1. Sepsis  due to cellulitis The patient is presenting with complaints of generalized weakness and near fall.  His found to have mild lactic acidosis, leukocytosis, fever, mild acute kidney injury. This suggest patient is meeting criteria for sepsis as he is also found to have leukocytosis with redness involving the right leg. Currently patient will be treated with vancomycin. Follow blood cultures and monitor him on telemetry. Wound care consult in the morning. Patient may require podiatry care on discharge.  2.Chronic A. fib, mitral or replacement. Continue warfarin. Due to near fall check CT head to rule out any acute bleed.  3. Chronic systolic heart failure. Patient has received some IV fluid initially and his lactic acid has improved significantly. Patient is on 60 mg of Lasix at home. Patient is also received 40 mg of IV Lasix here in the ER. Currently I would hold off his Lasix and monitor his ins and outs and daily weight.  4. Acute kidney injury. Most likely due to prerenal etiology due to ongoing infection. Check serum BMP and avoid nephrotoxic medications. Renal dozing off vancomycin  Advance goals of care discussion: DNR/DNI as per my discussion with patient.  DVT Prophylaxis: chronic anticoagulation Nutrition:  cardiac diet   Family Communication: Patient would like [place] to notify patient's daughter about his current management and code status in the morning. Disposition: Admitted to inpatient in telemetryunit.  Author: Berle Mull, MD [facility] Pager: [phone] 03/09/2014, 11:42 PM    If 7PM-7AM, please contact night-coverage [email or web address] Password [email or web address]

## 2014-03-09 NOTE — ED Provider Notes (Signed)
CSN: [id number]     Arrival date & time 03/09/14  1913 History   First MD Initiated Contact with Patient 03/09/14 1916     Chief Complaint  Patient presents with  . Weakness     (Consider location/radiation/quality/duration/timing/severity/associated sxs/prior Treatment) HPI Comments: Patient is an 81 yo M PMHx significant for severe MR s/p MVR, HTN, dyslipidemia, chronic atrial fibrillation, CAD s/p CABG and ischemic dilated CM with EF 35-30% presenting to the ED for generalized weakness. Patient states he felt fatigued today, while he was in his kitchen just PTA he felt weak all over and sat down on the ground. He states he was unable to get himself off the floor. He denies hitting his head or LOC. He does endorse that his right ankle as been red and warm for some time now he is unable to specify the length of his symptoms. No modifying factors identified. Denies any fevers, chills, nausea, vomiting, abdominal pain, diarrhea, CP, SOB, change in DOE at baseline, cough. Last echocardiogram was 18 months ago.  Patient is a 81 y.o. male presenting with weakness.  Weakness Associated symptoms include weakness (generalized). Pertinent negatives include no abdominal pain, chest pain, coughing, nausea or vomiting.    Past Medical History  Diagnosis Date  . Hypertension   . Shortness of breath     "w/any activity lately" (02/03/2012)  . CHF (congestive heart failure)     mild/note 02/03/2012  . Arthritis     "left foot; fingers" (02/03/2012)  . ARF (acute renal failure)   . GIB (gastrointestinal bleeding)   . Chronic atrial fibrillation   . Non-sustained ventricular tachycardia   . Chronic anticoagulation   . Coronary artery disease 05/2000    s/p CABG with LIMA to LAD, SVG to IM, SVG to OM  . Mitral valvular regurgitation     s/p MVR with mechanical valve  . Ischemic dilated cardiomyopathy     EF 25-30% by echo 01/2012 at time of colon surgery  . Hyperlipidemia   . Stroke     after  CABG  . Carcinoma of colon   . Basal cell carcinoma of shoulder 10/2010   Past Surgical History  Procedure Laterality Date  . Inguinal hernia repair      right  . Cataract extraction w/ intraocular lens  implant, bilateral  ~ 2011  . Valve replacement  05/2000    St. Jude 54mm mechanical valve  . Cardiac valve replacement  05/2000    St. Jude 48mm  . Esophagogastroduodenoscopy  02/08/2012    Procedure: ESOPHAGOGASTRODUODENOSCOPY (EGD);  Surgeon: Gatha Mayer, MD;  Location: [facility] ENDOSCOPY;  Service: Endoscopy;  Laterality: N/A;  . Colonoscopy  02/08/2012    Procedure: COLONOSCOPY;  Surgeon: Gatha Mayer, MD;  Location: [place];  Service: Endoscopy;  Laterality: N/A;  . Laparoscopic partial colectomy  02/10/2012    Procedure: LAPAROSCOPIC PARTIAL COLECTOMY;  Surgeon: Stark Klein, MD;  Location: [facility] OR;  Service: General;  Laterality: N/A;  . Coronary artery bypass graft      LIMA to LAD, SVG to IM, SVG to OM2   Family History  Problem Relation Age of Onset  . Heart disease Mother    History  Substance Use Topics  . Smoking status: Former Smoker -- 1.00 packs/day for 20 years    Types: Cigarettes    Quit date: 03/09/1980  . Smokeless tobacco: Never Used     Comment: 02/03/2012 "quit smoking in my 83's"  . Alcohol Use: No  Review of Systems  Respiratory: Negative for cough and shortness of breath.   Cardiovascular: Positive for leg swelling. Negative for chest pain.  Gastrointestinal: Negative for nausea, vomiting, abdominal pain and diarrhea.  Skin: Positive for color change and wound.  Neurological: Positive for weakness (generalized).  All other systems reviewed and are negative.     Allergies  Review of patient's allergies indicates no known allergies.  Home Medications   Prior to Admission medications   Medication Sig Start Date End Date Taking? Authorizing Provider  furosemide (LASIX) 20 MG tablet Take 20 mg by mouth 3 (three) times daily.   Yes Historical  Provider, MD  furosemide (LASIX) 40 MG tablet Take 40 mg by mouth 3 (three) times daily. Take three times a day per patient   Yes Historical Provider, MD  hydrALAZINE (APRESOLINE) 25 MG tablet Take 25 mg by mouth 3 (three) times daily.    Yes Historical Provider, MD  metoprolol (LOPRESSOR) 50 MG tablet Take 100 mg by mouth 2 (two) times daily.   Yes Historical Provider, MD  Multiple Vitamin (MULTIVITAMIN WITH MINERALS) TABS Take 1 tablet by mouth daily.   Yes Historical Provider, MD  omega-3 acid ethyl esters (LOVAZA) 1 G capsule Take 1 g by mouth 3 (three) times daily.   Yes Historical Provider, MD  simvastatin (ZOCOR) 10 MG tablet Take 10 mg by mouth at bedtime.   Yes Historical Provider, MD  warfarin (COUMADIN) 5 MG tablet Take 2.5-5 mg by mouth daily. Take whole pill on sundays and thursdays and half pill rest of days   Yes Historical Provider, MD  furosemide (LASIX) 20 MG tablet Take 60 mg by mouth daily.    Historical Provider, MD  warfarin (COUMADIN) 5 MG tablet Take as directed by [facility] Patient not taking: Reported on 03/09/2014 09/11/13   Sueanne Margarita, MD   BP 120/53 mmHg  Pulse 65  Temp(Src) 98.3 F (36.8 C) (Oral)  Resp 17  Ht 5\' 7"  (1.702 m)  Wt 150 lb (68.04 kg)  BMI 23.49 kg/m2  SpO2 96% Physical Exam  Constitutional: He is oriented to person, place, and time. He appears well-developed and well-nourished. No distress.  HENT:  Head: Normocephalic and atraumatic.  Right Ear: External ear normal.  Left Ear: External ear normal.  Nose: Nose normal.  Mouth/Throat: Oropharynx is clear and moist. No oropharyngeal exudate.  Eyes: Conjunctivae and EOM are normal. Pupils are equal, round, and reactive to light.  Neck: Normal range of motion. Neck supple.  Cardiovascular: Normal rate and intact distal pulses.  An irregularly irregular rhythm present.  Murmur heard. Pulmonary/Chest: Effort normal and breath sounds normal. No respiratory distress.  Abdominal: Soft. Bowel  sounds are normal. There is no tenderness.  Musculoskeletal:  BLE edema 2+  Neurological: He is alert and oriented to person, place, and time. He has normal strength. No cranial nerve deficit. Gait normal. GCS eye subscore is 4. GCS verbal subscore is 5. GCS motor subscore is 6.  Sensation grossly intact.  No pronator drift.  Bilateral heel-knee-shin intact.  Skin: Skin is warm and dry. He is not diaphoretic.  Bilateral feet with cracked skin. R foot and ankle erythematous, warm.    Nursing note and vitals reviewed.   ED Course  Procedures (including critical care time) Medications  metoprolol tartrate (LOPRESSOR) tablet 100 mg (not administered)  simvastatin (ZOCOR) tablet 10 mg (not administered)  sodium chloride 0.9 % injection 3 mL (not administered)  acetaminophen (TYLENOL) tablet 650 mg (not  administered)    Or  acetaminophen (TYLENOL) suppository 650 mg (not administered)  ondansetron (ZOFRAN) tablet 4 mg (not administered)    Or  ondansetron (ZOFRAN) injection 4 mg (not administered)  vancomycin (VANCOCIN) IVPB 1000 mg/200 mL premix (not administered)  acetaminophen (TYLENOL) tablet 650 mg (650 mg Oral Given 03/09/14 1958)  furosemide (LASIX) injection 40 mg (40 mg Intravenous Given 03/09/14 2002)  vancomycin (VANCOCIN) IVPB 1000 mg/200 mL premix (0 mg Intravenous Stopped 03/09/14 2119)  vancomycin (VANCOCIN) 500 mg in sodium chloride 0.9 % 100 mL IVPB (0 mg Intravenous Stopped 03/09/14 2225)    Labs Review Labs Reviewed  CBC WITH DIFFERENTIAL - Abnormal; Notable for the following:    WBC 14.4 (*)    RBC 4.09 (*)    Hemoglobin 12.3 (*)    HCT 37.7 (*)    Platelets 145 (*)    Neutrophils Relative % 91 (*)    Neutro Abs 13.0 (*)    Lymphocytes Relative 3 (*)    Lymphs Abs 0.5 (*)    All other components within normal limits  COMPREHENSIVE METABOLIC PANEL - Abnormal; Notable for the following:    Glucose, Bld 103 (*)    BUN 24 (*)    Creatinine, Ser 1.51 (*)    AST 111  (*)    ALT 56 (*)    GFR calc non Af Amer 39 (*)    GFR calc Af Amer 46 (*)    All other components within normal limits  URINALYSIS, ROUTINE W REFLEX MICROSCOPIC - Abnormal; Notable for the following:    APPearance CLOUDY (*)    Protein, ur 30 (*)    All other components within normal limits  TROPONIN I - Abnormal; Notable for the following:    Troponin I 0.05 (*)    All other components within normal limits  PROTIME-INR - Abnormal; Notable for the following:    Prothrombin Time 32.2 (*)    INR 3.10 (*)    All other components within normal limits  URINE MICROSCOPIC-ADD ON - Abnormal; Notable for the following:    Bacteria, UA FEW (*)    Casts GRANULAR CAST (*)    All other components within normal limits  I-STAT CG4 LACTIC ACID, ED - Abnormal; Notable for the following:    Lactic Acid, Venous 2.64 (*)    All other components within normal limits  CULTURE, BLOOD (ROUTINE X 2)  CULTURE, BLOOD (ROUTINE X 2)  URINE CULTURE  COMPREHENSIVE METABOLIC PANEL  CBC  PROTIME-INR  I-STAT CG4 LACTIC ACID, ED    Imaging Review Dg Chest Port 1 View  03/09/2014   CLINICAL DATA:  Acute onset of generalized weakness for 1 day. Initial encounter.  EXAM: PORTABLE CHEST - 1 VIEW  COMPARISON:  Chest radiograph performed 02/12/2012  FINDINGS: Vascular congestion is noted. Minimal bilateral opacities could reflect minimal interstitial edema or possibly a mild infectious process. There is elevation of the left hemidiaphragm. No pleural effusion or pneumothorax is seen.  The cardiomediastinal silhouette remains significantly enlarged. The patient is status post median sternotomy, with evidence of prior CABG. A valve replacement is noted. No acute osseous abnormalities are identified.  IMPRESSION: Vascular congestion and cardiomegaly noted. Minimal bilateral opacities could reflect minimal interstitial edema or possibly a mild infectious process, depending on the patient's symptoms. Stable chronic elevation  of the left hemidiaphragm.   Electronically Signed   By: Garald Balding M.D.   On: 03/09/2014 19:50     EKG Interpretation None  11:06 PM discussed patient case with Dr. Radford Pax who recommends cycling troponins and will available for consultation.   CRITICAL CARE Performed by: Baron Sane L   Total critical care time: 35 minutes  Critical care time was exclusive of separately billable procedures and treating other patients.  Critical care was necessary to treat or prevent imminent or life-threatening deterioration.  Critical care was time spent personally by me on the following activities: development of treatment plan with patient and/or surrogate as well as nursing, discussions with consultants, evaluation of patient's response to treatment, examination of patient, obtaining history from patient or surrogate, ordering and performing treatments and interventions, ordering and review of laboratory studies, ordering and review of radiographic studies, pulse oximetry and re-evaluation of patient's condition.  MDM   Final diagnoses:  Cellulitis of right lower extremity  Generalized weakness    Filed Vitals:   03/09/14 2300  BP: 120/53  Pulse: 65  Temp:   Resp: 17   Patient presenting with fever, generalized weakness, right ankle erythema warmth and swelling. I have reviewed nursing notes, vital signs, and all appropriate lab and imaging results for this patient. Cellulitis is likely source of infection and generalized weakness. Will gently hydrate patient and provide Lasix to ensure no fluid overload. Vancomycin started. Tylenol given for fever. Troponin minimally elevated, likely secondary to infection, cardiology will follow as needed. Patient will be admitted to the hospital for further management and evaluation. Patient d/w with Dr. Wilson Singer, agrees with plan.     Harlow Mares, PA-C 03/09/14 2356  Virgel Manifold, MD 03/10/14 [phone]

## 2014-03-09 NOTE — ED Notes (Signed)
Pt comes via [facility] from home, pt was walking from kitchen and started feeling weak and slid down to floor, no LOC, did not hit head, pt has hx of afib, lots of PVC's per EMS

## 2014-03-09 NOTE — Progress Notes (Addendum)
ANTIBIOTIC CONSULT NOTE - INITIAL  Pharmacy Consult for vancomycin and zosyn Indication: cellulitis and possible sepsis  No Known Allergies  Patient Measurements: Height: 5\' 7"  (170.2 cm) Weight: 150 lb (68.04 kg) IBW/kg (Calculated) : 66.1  Vital Signs: Temp: 102.6 F (39.2 C) (01/01 1928) Temp Source: Rectal (01/01 1928) BP: 129/58 mmHg (01/01 1920) Pulse Rate: 95 (01/01 1920) Intake/Output from previous day:   Intake/Output from this shift:    Labs: No results for input(s): WBC, HGB, PLT, LABCREA, CREATININE in the last 72 hours. Estimated Creatinine Clearance: 34.1 mL/min (by C-G formula based on Cr of 1.4). No results for input(s): VANCOTROUGH, VANCOPEAK, VANCORANDOM, GENTTROUGH, GENTPEAK, GENTRANDOM, TOBRATROUGH, TOBRAPEAK, TOBRARND, AMIKACINPEAK, AMIKACINTROU, AMIKACIN in the last 72 hours.   Microbiology: No results found for this or any previous visit (from the past 720 hour(s)).  Medical History: Past Medical History  Diagnosis Date  . Hypertension   . Shortness of breath     "w/any activity lately" (02/03/2012)  . CHF (congestive heart failure)     mild/note 02/03/2012  . Arthritis     "left foot; fingers" (02/03/2012)  . ARF (acute renal failure)   . GIB (gastrointestinal bleeding)   . Chronic atrial fibrillation   . Non-sustained ventricular tachycardia   . Chronic anticoagulation   . Coronary artery disease 05/2000    s/p CABG with LIMA to LAD, SVG to IM, SVG to OM  . Mitral valvular regurgitation     s/p MVR with mechanical valve  . Ischemic dilated cardiomyopathy     EF 25-30% by echo 01/2012 at time of colon surgery  . Hyperlipidemia   . Stroke     after CABG  . Carcinoma of colon   . Basal cell carcinoma of shoulder 10/2010    Medications:  Scheduled:   Assessment: 81 yo M presented to the ED complaining of weakness. Per MD note, patient states that his right ankle has been red and warm for sometime now. Pharmacy consulted to begin  vancomycin for cellulitis with possible sepsis. Labs have been collected but no results yet.   Goal of Therapy:  Vancomycin trough level 10-15 mcg/ml  Plan:  Vancomycin 500mg  x 1 for total LD = 1500mg  F/u labs for further dosing Monitor cultures, renal function, and clinical progress De-escalation of abx if determined to be not septic VT at steady state if clinically warranted  Thank you for allowing pharmacy to be part of this patient's care team  Mirha Brucato M. Caidance Sybert, Pharm.D Clinical Pharmacy Resident Pager: [phone] 03/09/2014 .8:30 PM  [place] consulted to add zosyn to therapy for cellulitis with possible sepsis. CBC now resulted - WBC 14.4, T 102.6. Still awaiting CMET results.   Plan: Zosyn 3.375g IV 79min infusion x 1  Zosyn 3.375g IV q8h EI to begin 6hrs after 76min infusion dose F/u labs Monitor clinical progress, cultures, and renal function  Thank you for allowing pharmacy to be part of this patient's care team  [place], Pharm.D Clinical Pharmacy Resident Pager: [phone] 03/09/2014 .8:53 PM

## 2014-03-09 NOTE — Progress Notes (Signed)
ANTICOAGULATION CONSULT NOTE - Initial Consult  Pharmacy Consult for coumadin  Indication: mechanical mitral valve  No Known Allergies  Patient Measurements: Height: 5\' 7"  (170.2 cm) Weight: 150 lb (68.04 kg) IBW/kg (Calculated) : 66.1 Heparin Dosing Weight:   Vital Signs: Temp: 98.3 F (36.8 C) (01/01 2246) Temp Source: Oral (01/01 2246) BP: 120/53 mmHg (01/01 2300) Pulse Rate: 65 (01/01 2300)  Labs:  Recent Labs  03/09/14 1945  HGB 12.3*  HCT 37.7*  PLT 145*  LABPROT 32.2*  INR 3.10*  CREATININE 1.51*  TROPONINI 0.05*    Estimated Creatinine Clearance: 31.6 mL/min (by C-G formula based on Cr of 1.51).   Medical History: Past Medical History  Diagnosis Date  . Hypertension   . Shortness of breath     "w/any activity lately" (02/03/2012)  . CHF (congestive heart failure)     mild/note 02/03/2012  . Arthritis     "left foot; fingers" (02/03/2012)  . ARF (acute renal failure)   . GIB (gastrointestinal bleeding)   . Chronic atrial fibrillation   . Non-sustained ventricular tachycardia   . Chronic anticoagulation   . Coronary artery disease 05/2000    s/p CABG with LIMA to LAD, SVG to IM, SVG to OM  . Mitral valvular regurgitation     s/p MVR with mechanical valve  . Ischemic dilated cardiomyopathy     EF 25-30% by echo 01/2012 at time of colon surgery  . Hyperlipidemia   . Stroke     after CABG  . Carcinoma of colon   . Basal cell carcinoma of shoulder 10/2010    Medications:   (Not in a hospital admission)  Assessment: Coumadin for mechanical valve. inr is 3.01. Appropriate to continue home dose.  Goal of Therapy:   inr 2.5-3.5    Plan:  5mg  Sunday and Thurs 2.5 mg all other days.  inr daily  Curlene Dolphin 03/09/2014,11:51 PM

## 2014-03-09 NOTE — ED Notes (Signed)
Attempted report 

## 2014-03-09 NOTE — ED Notes (Signed)
Pt wallet given to wife

## 2014-03-10 ENCOUNTER — Inpatient Hospital Stay (HOSPITAL_COMMUNITY): Payer: Medicare Other

## 2014-03-10 DIAGNOSIS — I10 Essential (primary) hypertension: Secondary | ICD-10-CM

## 2014-03-10 DIAGNOSIS — I5043 Acute on chronic combined systolic (congestive) and diastolic (congestive) heart failure: Secondary | ICD-10-CM

## 2014-03-10 LAB — COMPREHENSIVE METABOLIC PANEL
ALBUMIN: 3.2 g/dL — AB (ref 3.5–5.2)
ALT: 52 U/L (ref 0–53)
ANION GAP: 8 (ref 5–15)
AST: 82 U/L — ABNORMAL HIGH (ref 0–37)
Alkaline Phosphatase: 79 U/L (ref 39–117)
BILIRUBIN TOTAL: 1.5 mg/dL — AB (ref 0.3–1.2)
BUN: 22 mg/dL (ref 6–23)
CALCIUM: 8.8 mg/dL (ref 8.4–10.5)
CO2: 26 mmol/L (ref 19–32)
Chloride: 104 mEq/L (ref 96–112)
Creatinine, Ser: 1.53 mg/dL — ABNORMAL HIGH (ref 0.50–1.35)
GFR calc Af Amer: 45 mL/min — ABNORMAL LOW (ref >90)
GFR, EST NON AFRICAN AMERICAN: 39 mL/min — AB (ref >90)
GLUCOSE: 118 mg/dL — AB (ref 70–99)
POTASSIUM: 4.1 mmol/L (ref 3.5–5.1)
Sodium: 138 mmol/L (ref 135–145)
Total Protein: 5.8 g/dL — ABNORMAL LOW (ref 6.0–8.3)

## 2014-03-10 LAB — CBC
HCT: 33.3 % — ABNORMAL LOW (ref 39.0–52.0)
Hemoglobin: 11 g/dL — ABNORMAL LOW (ref 13.0–17.0)
MCH: 30.3 pg (ref 26.0–34.0)
MCHC: 33 g/dL (ref 30.0–36.0)
MCV: 91.7 fL (ref 78.0–100.0)
PLATELETS: 96 10*3/uL — AB (ref 150–400)
RBC: 3.63 MIL/uL — AB (ref 4.22–5.81)
RDW: 14.9 % (ref 11.5–15.5)
WBC: 10.6 10*3/uL — ABNORMAL HIGH (ref 4.0–10.5)

## 2014-03-10 LAB — PROTIME-INR
INR: 2.88 — ABNORMAL HIGH (ref 0.00–1.49)
PROTHROMBIN TIME: 30.4 s — AB (ref 11.6–15.2)

## 2014-03-10 MED ORDER — FUROSEMIDE 10 MG/ML IJ SOLN
8.0000 mg/h | INTRAVENOUS | Status: DC
  Administered 2014-03-10: 4 mg/h via INTRAVENOUS
  Administered 2014-03-12: 8 mg/h via INTRAVENOUS
  Filled 2014-03-10 (×3): qty 25

## 2014-03-10 MED ORDER — CETYLPYRIDINIUM CHLORIDE 0.05 % MT LIQD
7.0000 mL | Freq: Two times a day (BID) | OROMUCOSAL | Status: DC
  Administered 2014-03-10 – 2014-03-14 (×9): 7 mL via OROMUCOSAL

## 2014-03-10 NOTE — Progress Notes (Signed)
The patient arrived to 3E14 from the ED at 2345.  He was oriented to the unit and placed on telemetry.  CCMD was notified.  He is A&Ox4 and does not have any complaints of pain at this time.  He is A fib on the monitor and his VS are stable.  He was given a heart failure packet for a history of HF.  The call bell was explained and placed within reach.  The bed alarm was turned on.

## 2014-03-10 NOTE — Progress Notes (Signed)
ANTICOAGULATION CONSULT NOTE - Follow Up Consult  Pharmacy Consult for warfarin Indication: atrial fibrillation and mechanical MVR  No Known Allergies  Patient Measurements: Height: 5\' 7"  (170.2 cm) Weight: 153 lb 10.6 oz (69.7 kg) IBW/kg (Calculated) : 66.1  Vital Signs: Temp: 97.9 F (36.6 C) (01/02 0425) Temp Source: Oral (01/02 0425) BP: 109/41 mmHg (01/02 0425) Pulse Rate: 72 (01/02 0425)  Labs:  Recent Labs  03/09/14 1945 03/10/14 0328  HGB 12.3* 11.0*  HCT 37.7* 33.3*  PLT 145* 96*  LABPROT 32.2* 30.4*  INR 3.10* 2.88*  CREATININE 1.51* 1.53*  TROPONINI 0.05*  --     Estimated Creatinine Clearance: 31.2 mL/min (by C-G formula based on Cr of 1.53).   Assessment: 81 y/o male admitted 1/1 with weakness and near fall. He takes chronic warfarin for Afib and mechanical MVR. INR is therapeutic at 2.88 this morning. No bleeding noted, Hb is low stable, platelets are low and have trended down (96<<145).   PTA: 2.5 mg daily x 5 mg Thu/Sun  Patient received vancomycin 2500 mg IV within 6 hrs total - extra 1000 mg given. AKI present, with SCr at 1.53, CrCl ~30 ml/min. SZP filed.  Goal of Therapy:  INR 2.5-3.5 Monitor platelets by anticoagulation protocol: Yes   Plan:  - Warfarin 2.5 mg PO daily except 5 mg on Thu/Sun - INR daily - Monitor for s/sx of bleeding  - Check random vancomycin level tomorrow morning  [facility], [place].D., BCPS Clinical Pharmacist Pager: [phone] 03/10/2014 10:52 AM

## 2014-03-10 NOTE — Progress Notes (Signed)
[facility] PROGRESS NOTE  Filed Weights   03/09/14 1916 03/09/14 1938 03/09/14 2359  Weight: 68.04 kg (150 lb) 68.04 kg (150 lb) 69.7 kg (153 lb 10.6 oz)        Intake/Output Summary (Last 24 hours) at 03/10/14 0908 Last data filed at 03/10/14 8341  Gross per 24 hour  Intake    480 ml  Output    400 ml  Net     80 ml     Assessment/Plan: Sepsis due to cellulitis - Started on IV vancomycin on 1.1.2016. - BC pending, afebrile, leukocytosis improved. - WOC  Chronic A. fib, mitral/S/P MVR (mitral valve replacement): - Continue warfarin. - Due to near fall check CT head no IC bleed.  Acute on Chronic systolic heart failure: - Patient has received some IV fluid initially and his lactic acid has improved significantly. Patient is also received 40 mg of IV Lasix here in the ER. - Has an elevated Cr and he is hypervolemic. - Start IV lasix ggt, monitor lytes. - Restrict fluids and sodium.  Acute kidney injury: Most likely due to prerenal etiology due to ongoing infection. Check serum BMP and avoid nephrotoxic medications. Renal dozing off vancomycin    Code Status: DNR Family Communication: daughter and wife  Disposition Plan: inpatient   Consultants:  none  Procedures: ECHO: none CT head: No acute intracranial abnormalities. Chronic atrophy and small vessel ischemic changes.  Antibiotics:  Vanc 12.2.2016  HPI/Subjective: Afebrile, slightly better  Objective: Filed Vitals:   03/09/14 2246 03/09/14 2300 03/09/14 2359 03/10/14 0425  BP:  120/53 133/63 109/41  Pulse:  65 74 72  Temp: 98.3 F (36.8 C)  98.4 F (36.9 C) 97.9 F (36.6 C)  TempSrc: Oral  Oral Oral  Resp:  17 14 18   Height:   5\' 7"  (1.702 m)   Weight:   69.7 kg (153 lb 10.6 oz)   SpO2:  96% 98% 98%     Exam:  General: Alert, awake, oriented x3, in no acute distress.  HEENT: No bruits, no goiter. +JVD Heart: Regular rate and rhythm, +S3. 3+ edema. Lungs: Good air movement,  crackles at bases. Abdomen: Soft, nontender, nondistended, positive bowel sounds.     Data Reviewed: Basic Metabolic Panel:  Recent Labs Lab 03/09/14 1945 03/10/14 0328  NA 137 138  K 5.1 4.1  CL 106 104  CO2 22 26  GLUCOSE 103* 118*  BUN 24* 22  CREATININE 1.51* 1.53*  CALCIUM 9.3 8.8   Liver Function Tests:  Recent Labs Lab 03/09/14 1945 03/10/14 0328  AST 111* 82*  ALT 56* 52  ALKPHOS 92 79  BILITOT 1.1 1.5*  PROT 6.8 5.8*  ALBUMIN 3.8 3.2*   No results for input(s): LIPASE, AMYLASE in the last 168 hours. No results for input(s): AMMONIA in the last 168 hours. CBC:  Recent Labs Lab 03/09/14 1945 03/10/14 0328  WBC 14.4* 10.6*  NEUTROABS 13.0*  --   HGB 12.3* 11.0*  HCT 37.7* 33.3*  MCV 92.2 91.7  PLT 145* 96*   Cardiac Enzymes:  Recent Labs Lab 03/09/14 1945  TROPONINI 0.05*   BNP (last 3 results) No results for input(s): PROBNP in the last 8760 hours. CBG: No results for input(s): GLUCAP in the last 168 hours.  No results found for this or any previous visit (from the past 240 hour(s)).   Studies: Ct Head Wo Contrast  03/10/2014   CLINICAL DATA:  Patient lost balance wall walking in slipped down  to the floor. Chronic anticoagulation.  EXAM: CT HEAD WITHOUT CONTRAST  TECHNIQUE: Contiguous axial images were obtained from the base of the skull through the vertex without intravenous contrast.  COMPARISON:  None.  FINDINGS: Diffuse cerebral atrophy. Ventricular dilatation consistent with central atrophy. Low-attenuation changes in the deep white matter consistent with small vessel ischemia. Small focal area of encephalomalacia in the left occipital region consistent with old infarct. No mass effect or midline shift. No abnormal extra-axial fluid collections. Gray-white matter junctions are distinct. Basal cisterns are not effaced. No evidence of acute intracranial hemorrhage. No depressed skull fractures. Visualized paranasal sinuses and mastoid air  cells are not opacified.  IMPRESSION: No acute intracranial abnormalities. Chronic atrophy and small vessel ischemic changes. 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   Electronically Signed   By: Lucienne Capers M.D.   On: 03/10/2014 01:07   Dg Chest Port 1 View  03/09/2014   CLINICAL DATA:  Acute onset of generalized weakness for 1 day. Initial encounter.  EXAM: PORTABLE CHEST - 1 VIEW  COMPARISON:  Chest radiograph performed 02/12/2012  FINDINGS: Vascular congestion is noted. Minimal bilateral opacities could reflect minimal interstitial edema or possibly a mild infectious process. There is elevation of the left hemidiaphragm. No pleural effusion or pneumothorax is seen.  The cardiomediastinal silhouette remains significantly enlarged. The patient is status post median sternotomy, with evidence of prior CABG. A valve replacement is noted. No acute osseous abnormalities are identified.  IMPRESSION: Vascular congestion and cardiomegaly noted. Minimal bilateral opacities could reflect minimal interstitial edema or possibly a mild infectious process, depending on the patient's symptoms. Stable chronic elevation of the left hemidiaphragm.   Electronically Signed   By: Garald Balding M.D.   On: 03/09/2014 19:50    Scheduled Meds: . antiseptic oral rinse  7 mL Mouth Rinse BID  . metoprolol  100 mg Oral BID  . simvastatin  10 mg Oral QHS  . sodium chloride  3 mL Intravenous Q12H  . warfarin  2.5 mg Oral Once per day on Mon Tue Wed Fri Sat  . [START ON 03/11/2014] warfarin  5 mg Oral Once per day on Sun Thu  . Warfarin - Pharmacist Dosing Inpatient   Does not apply q1800   Continuous Infusions: . furosemide (LASIX) infusion       Charlynne Cousins  [facility] Pager [phone] If 8PM-8AM, please contact night-coverage at [email or web address], password [facility] 03/10/2014, 9:08 AM  LOS: 1 day

## 2014-03-10 NOTE — Plan of Care (Signed)
Problem: Phase I Progression Outcomes Goal: Voiding-avoid urinary catheter unless indicated Outcome: Progressing Voiding in urinal.

## 2014-03-11 LAB — MAGNESIUM: MAGNESIUM: 1.9 mg/dL (ref 1.5–2.5)

## 2014-03-11 LAB — BASIC METABOLIC PANEL
Anion gap: 7 (ref 5–15)
BUN: 22 mg/dL (ref 6–23)
CALCIUM: 8.6 mg/dL (ref 8.4–10.5)
CO2: 27 mmol/L (ref 19–32)
Chloride: 102 mEq/L (ref 96–112)
Creatinine, Ser: 1.31 mg/dL (ref 0.50–1.35)
GFR, EST AFRICAN AMERICAN: 54 mL/min — AB (ref >90)
GFR, EST NON AFRICAN AMERICAN: 47 mL/min — AB (ref >90)
Glucose, Bld: 106 mg/dL — ABNORMAL HIGH (ref 70–99)
POTASSIUM: 3.7 mmol/L (ref 3.5–5.1)
SODIUM: 136 mmol/L (ref 135–145)

## 2014-03-11 LAB — URINE CULTURE: Colony Count: 15000

## 2014-03-11 LAB — VANCOMYCIN, RANDOM: Vancomycin Rm: 12.7 ug/mL

## 2014-03-11 LAB — PROTIME-INR
INR: 2.77 — AB (ref 0.00–1.49)
Prothrombin Time: 29.5 seconds — ABNORMAL HIGH (ref 11.6–15.2)

## 2014-03-11 MED ORDER — VANCOMYCIN HCL IN DEXTROSE 750-5 MG/150ML-% IV SOLN
750.0000 mg | INTRAVENOUS | Status: DC
  Administered 2014-03-11 – 2014-03-13 (×3): 750 mg via INTRAVENOUS
  Filled 2014-03-11 (×4): qty 150

## 2014-03-11 NOTE — Progress Notes (Signed)
ANTIBIOTIC CONSULT NOTE - FOLLOW UP  Pharmacy Consult for Vancomycin Indication: RLL Cellulitis  No Known Allergies  Patient Measurements: Height: 5\' 7"  (170.2 cm) Weight: 150 lb 6.4 oz (68.221 kg) IBW/kg (Calculated) : 66.1  Vital Signs: Temp: 98.3 F (36.8 C) (01/03 0400) BP: 116/62 mmHg (01/03 0400) Pulse Rate: 63 (01/03 0400) Intake/Output from previous day: 01/02 0701 - 01/03 0700 In: 1152.2 [P.O.:1122; I.V.:30.2] Out: 1375 [Urine:1375] Intake/Output from this shift: Total I/O In: -  Out: 775 [Urine:775]  Labs:  Recent Labs  03/09/14 1945 03/10/14 0328 03/11/14 0320  WBC 14.4* 10.6*  --   HGB 12.3* 11.0*  --   PLT 145* 96*  --   CREATININE 1.51* 1.53* 1.31   Estimated Creatinine Clearance: 36.4 mL/min (by C-G formula based on Cr of 1.31).  Recent Labs  03/11/14 0320  VANCORANDOM 12.7     Microbiology: No results found for this or any previous visit (from the past 720 hour(s)).  Anti-infectives    Start     Dose/Rate Route Frequency Ordered Stop   03/10/14 0400  piperacillin-tazobactam (ZOSYN) IVPB 3.375 g  Status:  Discontinued     3.375 g12.5 mL/hr over 240 Minutes Intravenous 3 times per day 03/09/14 2056 03/09/14 2210   03/09/14 2345  vancomycin (VANCOCIN) IVPB 1000 mg/200 mL premix     1,000 mg200 mL/hr over 60 Minutes Intravenous  Once 03/09/14 2342 03/10/14 0335   03/09/14 2100  piperacillin-tazobactam (ZOSYN) IVPB 3.375 g  Status:  Discontinued     3.375 g100 mL/hr over 30 Minutes Intravenous  Once 03/09/14 2056 03/09/14 2210   03/09/14 2045  vancomycin (VANCOCIN) 500 mg in sodium chloride 0.9 % 100 mL IVPB     500 mg100 mL/hr over 60 Minutes Intravenous  Once 03/09/14 2031 03/09/14 2225   03/09/14 2015  vancomycin (VANCOCIN) IVPB 1000 mg/200 mL premix     1,000 mg200 mL/hr over 60 Minutes Intravenous  Once 03/09/14 2006 03/09/14 2119      Assessment: 81 yo m who presented to the ED on 1/1 with weakness and near fall.  Pharmacy is consulted  to dose vancomycin for RLE cellulitis.  Patient received 2,500 mg of vancomycin in a 6 hour period (loading dose was supposed to only be 1,500 mg), so a random vancomycin was checked this AM.  Vancomycin level was therapeutic at 12.7 mcg/ml.  Will begin patient on standard dose based on renal function. Wbc 10.6, afebrile, SCr 1.31 (baseline ~0.9), CrCl ~ 36 ml/min.  Vancomycin 1/1 >>  1/1 UCx: 1/2 Bld x2:  Goal of Therapy:  Vancomycin trough level 10-15 mcg/ml  Plan:  Vancomycin 750 mg IV q24h VT a new Css if clinically indicated Monitor cbc, renal function, c&s, clinical course  Kyrra Prada L. Nicole Kindred, PharmD Clinical Pharmacy Resident Pager: [phone] 03/11/2014 7:32 AM

## 2014-03-11 NOTE — Plan of Care (Signed)
Problem: Phase I Progression Outcomes Goal: Voiding-avoid urinary catheter unless indicated Outcome: Adequate for Discharge Voiding and using urinal.

## 2014-03-11 NOTE — Progress Notes (Signed)
ANTICOAGULATION CONSULT NOTE - Follow Up Consult  Pharmacy Consult for warfarin Indication: atrial fibrillation and mechanical MVR  No Known Allergies  Patient Measurements: Height: 5\' 7"  (170.2 cm) Weight: 150 lb 6.4 oz (68.221 kg) IBW/kg (Calculated) : 66.1  Vital Signs: Temp: 98.3 F (36.8 C) (01/03 0400) BP: 116/62 mmHg (01/03 0400) Pulse Rate: 63 (01/03 0400)  Labs:  Recent Labs  03/09/14 1945 03/10/14 0328 03/11/14 0320  HGB 12.3* 11.0*  --   HCT 37.7* 33.3*  --   PLT 145* 96*  --   LABPROT 32.2* 30.4* 29.5*  INR 3.10* 2.88* 2.77*  CREATININE 1.51* 1.53* 1.31  TROPONINI 0.05*  --   --     Estimated Creatinine Clearance: 36.4 mL/min (by C-G formula based on Cr of 1.31).   Assessment: 81 y/o male admitted 1/1 with weakness and near fall. He takes chronic warfarin for Afib and mechanical MVR. INR is therapeutic at 2.77 this morning. No bleeding noted, Hb is low stable, platelets are low and have trended down (96<<145).   PTA: 2.5 mg daily x 5 mg Thu/Sun  Goal of Therapy:  INR 2.5-3.5 Monitor platelets by anticoagulation protocol: Yes   Plan:  - Warfarin 2.5 mg PO daily except 5 mg on Thu/Sun - INR daily - Monitor for s/sx of bleeding  [facility], Pharm.D., BCPS Clinical Pharmacist Pager: [phone] 03/11/2014 11:23 AM

## 2014-03-11 NOTE — Progress Notes (Addendum)
[facility] PROGRESS NOTE  Filed Weights   03/09/14 1938 03/09/14 2359 03/11/14 0400  Weight: 68.04 kg (150 lb) 69.7 kg (153 lb 10.6 oz) 68.221 kg (150 lb 6.4 oz)        Intake/Output Summary (Last 24 hours) at 03/11/14 0855 Last data filed at 03/11/14 0725  Gross per 24 hour  Intake 1152.2 ml  Output   2150 ml  Net -997.8 ml     Assessment/Plan: Sepsis due to cellulitis - Started on IV vancomycin on 1.1.2016. - BC pending, afebrile, leukocytosis improved. - WOC  Acute on Chronic systolic heart failure: - Increase IV lasix ggt, monitor lytes. Mod UOP - Cr has improved and he still vol overloaded. - Restrict fluids and sodium. - check a 2-d echo.  Chronic A. fib, mitral/S/P MVR (mitral valve replacement): - Continue warfarin. - Due to near fall,CT head on 1.2.2016 showed no IC bleed.  Acute kidney injury: - Resolved with diuresis. - Avoid nephrotoxic medications.    Code Status: DNR Family Communication: daughter and wife  Disposition Plan: inpatient   Consultants:  none  Procedures: ECHO: none CT head: No acute intracranial abnormalities. Chronic atrophy and small vessel ischemic changes.  Antibiotics:  Vanc 12.2.2016  HPI/Subjective: Afebrile, slightly better  Objective: Filed Vitals:   03/10/14 1110 03/10/14 1403 03/10/14 2000 03/11/14 0400  BP: 114/60 106/49 114/59 116/62  Pulse: 70 54 56 63  Temp:  98.2 F (36.8 C) 98.8 F (37.1 C) 98.3 F (36.8 C)  TempSrc:  Oral    Resp:  18 12 13   Height:      Weight:    68.221 kg (150 lb 6.4 oz)  SpO2:  100% 97% 100%     Exam:  General: Alert, awake, oriented x3, in no acute distress.  HEENT: No bruits, no goiter. +JVD Heart: Regular rate and rhythm, +S3. 3+ edema. Lungs: Good air movement, crackles at bases. Abdomen: Soft, nontender, nondistended, positive bowel sounds.     Data Reviewed: Basic Metabolic Panel:  Recent Labs Lab 03/09/14 1945 03/10/14 0328 03/11/14 0320  03/11/14 0600  NA 137 138 136  --   K 5.1 4.1 3.7  --   CL 106 104 102  --   CO2 22 26 27   --   GLUCOSE 103* 118* 106*  --   BUN 24* 22 22  --   CREATININE 1.51* 1.53* 1.31  --   CALCIUM 9.3 8.8 8.6  --   MG  --   --   --  1.9   Liver Function Tests:  Recent Labs Lab 03/09/14 1945 03/10/14 0328  AST 111* 82*  ALT 56* 52  ALKPHOS 92 79  BILITOT 1.1 1.5*  PROT 6.8 5.8*  ALBUMIN 3.8 3.2*   No results for input(s): LIPASE, AMYLASE in the last 168 hours. No results for input(s): AMMONIA in the last 168 hours. CBC:  Recent Labs Lab 03/09/14 1945 03/10/14 0328  WBC 14.4* 10.6*  NEUTROABS 13.0*  --   HGB 12.3* 11.0*  HCT 37.7* 33.3*  MCV 92.2 91.7  PLT 145* 96*   Cardiac Enzymes:  Recent Labs Lab 03/09/14 1945  TROPONINI 0.05*   BNP (last 3 results) No results for input(s): PROBNP in the last 8760 hours. CBG: No results for input(s): GLUCAP in the last 168 hours.  No results found for this or any previous visit (from the past 240 hour(s)).   Studies: Ct Head Wo Contrast  03/10/2014   CLINICAL DATA:  Patient lost  balance wall walking in slipped down to the floor. Chronic anticoagulation.  EXAM: CT HEAD WITHOUT CONTRAST  TECHNIQUE: Contiguous axial images were obtained from the base of the skull through the vertex without intravenous contrast.  COMPARISON:  None.  FINDINGS: Diffuse cerebral atrophy. Ventricular dilatation consistent with central atrophy. Low-attenuation changes in the deep white matter consistent with small vessel ischemia. Small focal area of encephalomalacia in the left occipital region consistent with old infarct. No mass effect or midline shift. No abnormal extra-axial fluid collections. Gray-white matter junctions are distinct. Basal cisterns are not effaced. No evidence of acute intracranial hemorrhage. No depressed skull fractures. Visualized paranasal sinuses and mastoid air cells are not opacified.  IMPRESSION: No acute intracranial  abnormalities. Chronic atrophy and small vessel ischemic changes. 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   Electronically Signed   By: Lucienne Capers M.D.   On: 03/10/2014 01:07   Dg Chest Port 1 View  03/09/2014   CLINICAL DATA:  Acute onset of generalized weakness for 1 day. Initial encounter.  EXAM: PORTABLE CHEST - 1 VIEW  COMPARISON:  Chest radiograph performed 02/12/2012  FINDINGS: Vascular congestion is noted. Minimal bilateral opacities could reflect minimal interstitial edema or possibly a mild infectious process. There is elevation of the left hemidiaphragm. No pleural effusion or pneumothorax is seen.  The cardiomediastinal silhouette remains significantly enlarged. The patient is status post median sternotomy, with evidence of prior CABG. A valve replacement is noted. No acute osseous abnormalities are identified.  IMPRESSION: Vascular congestion and cardiomegaly noted. Minimal bilateral opacities could reflect minimal interstitial edema or possibly a mild infectious process, depending on the patient's symptoms. Stable chronic elevation of the left hemidiaphragm.   Electronically Signed   By: Garald Balding M.D.   On: 03/09/2014 19:50    Scheduled Meds: . antiseptic oral rinse  7 mL Mouth Rinse BID  . metoprolol  100 mg Oral BID  . simvastatin  10 mg Oral QHS  . sodium chloride  3 mL Intravenous Q12H  . vancomycin  750 mg Intravenous Q24H  . warfarin  2.5 mg Oral Once per day on Mon Tue Wed Fri Sat  . warfarin  5 mg Oral Once per day on Sun Thu  . Warfarin - Pharmacist Dosing Inpatient   Does not apply q1800   Continuous Infusions: . furosemide (LASIX) infusion 4 mg/hr (03/10/14 1110)     Charlynne Cousins  [facility] Pager [phone] If 8PM-8AM, please contact night-coverage at [email or web address], password [facility] 03/11/2014, 8:55 AM  LOS: 2 days

## 2014-03-11 NOTE — Progress Notes (Signed)
Patient had a nine beat run of V-Tach. NP Rogue Bussing notified and ordered a magnesium level on patient to be drawn with AM labs. Patient is asymptomatic and denies c/o chest pain or shortness of breath. Patient is otherwise in controlled A-fib on the monitor.  Esperanza Heir, RN

## 2014-03-12 DIAGNOSIS — L03115 Cellulitis of right lower limb: Secondary | ICD-10-CM | POA: Diagnosis present

## 2014-03-12 DIAGNOSIS — I369 Nonrheumatic tricuspid valve disorder, unspecified: Secondary | ICD-10-CM

## 2014-03-12 DIAGNOSIS — I4729 Other ventricular tachycardia: Secondary | ICD-10-CM

## 2014-03-12 DIAGNOSIS — I472 Ventricular tachycardia: Secondary | ICD-10-CM

## 2014-03-12 LAB — BASIC METABOLIC PANEL
Anion gap: 8 (ref 5–15)
BUN: 25 mg/dL — AB (ref 6–23)
CALCIUM: 9.1 mg/dL (ref 8.4–10.5)
CO2: 33 mmol/L — AB (ref 19–32)
Chloride: 96 mEq/L (ref 96–112)
Creatinine, Ser: 1.25 mg/dL (ref 0.50–1.35)
GFR calc Af Amer: 57 mL/min — ABNORMAL LOW (ref >90)
GFR, EST NON AFRICAN AMERICAN: 50 mL/min — AB (ref >90)
GLUCOSE: 110 mg/dL — AB (ref 70–99)
POTASSIUM: 2.9 mmol/L — AB (ref 3.5–5.1)
Sodium: 137 mmol/L (ref 135–145)

## 2014-03-12 LAB — PROTIME-INR
INR: 2.47 — AB (ref 0.00–1.49)
Prothrombin Time: 27 seconds — ABNORMAL HIGH (ref 11.6–15.2)

## 2014-03-12 MED ORDER — FUROSEMIDE 10 MG/ML IJ SOLN: 40.0000 mg | Freq: Two times a day (BID) | INTRAMUSCULAR | Status: DC

## 2014-03-12 MED ORDER — METOPROLOL SUCCINATE ER 100 MG PO TB24
100.0000 mg | ORAL_TABLET | Freq: Two times a day (BID) | ORAL | Status: DC
  Administered 2014-03-12 – 2014-03-14 (×4): 100 mg via ORAL
  Filled 2014-03-12 (×5): qty 1

## 2014-03-12 MED ORDER — FUROSEMIDE 40 MG PO TABS
40.0000 mg | ORAL_TABLET | Freq: Two times a day (BID) | ORAL | Status: DC
  Administered 2014-03-12 – 2014-03-14 (×4): 40 mg via ORAL
  Filled 2014-03-12 (×6): qty 1

## 2014-03-12 MED ORDER — HYDROCERIN EX CREA
TOPICAL_CREAM | Freq: Every day | CUTANEOUS | Status: DC
Start: 2014-03-12 — End: 2014-03-14
  Administered 2014-03-12 – 2014-03-14 (×3): via TOPICAL
  Filled 2014-03-12: qty 113

## 2014-03-12 MED ORDER — POTASSIUM CHLORIDE CRYS ER 20 MEQ PO TBCR
40.0000 meq | EXTENDED_RELEASE_TABLET | ORAL | Status: AC
  Administered 2014-03-12 (×2): 40 meq via ORAL
  Filled 2014-03-12 (×2): qty 2

## 2014-03-12 MED ORDER — MUPIROCIN CALCIUM 2 % EX CREA
TOPICAL_CREAM | Freq: Every day | CUTANEOUS | Status: DC
  Administered 2014-03-12 – 2014-03-14 (×3): via TOPICAL
  Filled 2014-03-12: qty 15

## 2014-03-12 NOTE — Consult Note (Signed)
Reason for Consult:   NSVT, CHF  Requesting Physician: [facility]  HPI: This is a 81 y.o. male with a past medical history significant for St Jude MVR and CABG x 3 in 2002. He is followed by Dr Radford Pax. He has CAF and an ischemic cardiomyopathy, his last EF was 25-30% in 2013. He was admitted 03/09/13 after a mechanical fall at home. His CXR suggested CHF. He was felt to have cellulitis. Today he had 10 beats of NSVT on telemetry. When I interviewed him he repeatedly insisted he was admitted the day after Christmas and became angry when I told him today's date.   PMHx:  Past Medical History  Diagnosis Date  . Hypertension   . Shortness of breath     "w/any activity lately" (02/03/2012)  . CHF (congestive heart failure)     mild/note 02/03/2012  . Arthritis     "left foot; fingers" (02/03/2012)  . ARF (acute renal failure)   . GIB (gastrointestinal bleeding)   . Chronic atrial fibrillation   . Non-sustained ventricular tachycardia   . Chronic anticoagulation   . Coronary artery disease 05/2000    s/p CABG with LIMA to LAD, SVG to IM, SVG to OM  . Mitral valvular regurgitation     s/p MVR with mechanical valve  . Ischemic dilated cardiomyopathy     EF 25-30% by echo 01/2012 at time of colon surgery  . Hyperlipidemia   . Stroke     after CABG  . Carcinoma of colon   . Basal cell carcinoma of shoulder 10/2010    Past Surgical History  Procedure Laterality Date  . Inguinal hernia repair      right  . Cataract extraction w/ intraocular lens  implant, bilateral  ~ 2011  . Valve replacement  05/2000    St. Jude 75mm mechanical valve  . Cardiac valve replacement  05/2000    St. Jude 68mm  . Esophagogastroduodenoscopy  02/08/2012    Procedure: ESOPHAGOGASTRODUODENOSCOPY (EGD);  Surgeon: Gatha Mayer, MD;  Location: [facility] ENDOSCOPY;  Service: Endoscopy;  Laterality: N/A;  . Colonoscopy  02/08/2012    Procedure: COLONOSCOPY;  Surgeon: Gatha Mayer, MD;  Location: [place];  Service: Endoscopy;  Laterality: N/A;  . Laparoscopic partial colectomy  02/10/2012    Procedure: LAPAROSCOPIC PARTIAL COLECTOMY;  Surgeon: Stark Klein, MD;  Location: [place];  Service: General;  Laterality: N/A;  . Coronary artery bypass graft      LIMA to LAD, SVG to IM, SVG to OM2    SOCHx:  reports that he quit smoking about 34 years ago. His smoking use included Cigarettes. He has a 20 pack-year smoking history. He has never used smokeless tobacco. He reports that he does not drink alcohol or use illicit drugs.  FAMHx: Family History  Problem Relation Age of Onset  . Heart disease Mother     ALLERGIES: No Known Allergies  ROS: Pertinent items are noted in HPI. see H&P for complete details  HOME MEDICATIONS: Prior to Admission medications   Medication Sig Start Date End Date Taking? Authorizing Provider  furosemide (LASIX) 20 MG tablet Take 20 mg by mouth 3 (three) times daily.   Yes Historical Provider, MD  furosemide (LASIX) 40 MG tablet Take 40 mg by mouth 3 (three) times daily. Take three times a day per patient   Yes Historical Provider, MD  hydrALAZINE (APRESOLINE) 25 MG tablet Take 25 mg by mouth 3 (three)  times daily.    Yes Historical Provider, MD  metoprolol (LOPRESSOR) 50 MG tablet Take 100 mg by mouth 2 (two) times daily.   Yes Historical Provider, MD  Multiple Vitamin (MULTIVITAMIN WITH MINERALS) TABS Take 1 tablet by mouth daily.   Yes Historical Provider, MD  omega-3 acid ethyl esters (LOVAZA) 1 G capsule Take 1 g by mouth 3 (three) times daily.   Yes Historical Provider, MD  simvastatin (ZOCOR) 10 MG tablet Take 10 mg by mouth at bedtime.   Yes Historical Provider, MD  warfarin (COUMADIN) 5 MG tablet Take 2.5-5 mg by mouth daily. Take whole pill on sundays and thursdays and half pill rest of days   Yes Historical Provider, MD  furosemide (LASIX) 20 MG tablet Take 60 mg by mouth daily.    Historical Provider, MD  warfarin (COUMADIN) 5 MG tablet Take as  directed by [facility] Patient not taking: Reported on 03/09/2014 09/11/13   Sueanne Margarita, MD    HOSPITAL MEDICATIONS: I have reviewed the patient's current medications.  VITALS: Blood pressure 107/47, pulse 72, temperature 98.2 F (36.8 C), temperature source Oral, resp. rate 18, height 5\' 7"  (1.702 m), weight 140 lb 9.6 oz (63.776 kg), SpO2 98 %.  PHYSICAL EXAM: General appearance: alert, cooperative and confused about the date Neck: no carotid bruit Lungs: decreased breath sounds Heart: irregularly irregular rhythm and positive valve sounds Abdomen: not distended Skin: pale cool dry Neurologic: Grossly normal  LABS: Results for orders placed or performed during the hospital encounter of 03/09/14 (from the past 24 hour(s))  Basic metabolic panel     Status: Abnormal   Collection Time: 03/12/14  4:38 AM  Result Value Ref Range   Sodium 137 135 - 145 mmol/L   Potassium 2.9 (L) 3.5 - 5.1 mmol/L   Chloride 96 96 - 112 mEq/L   CO2 33 (H) 19 - 32 mmol/L   Glucose, Bld 110 (H) 70 - 99 mg/dL   BUN 25 (H) 6 - 23 mg/dL   Creatinine, Ser 1.25 0.50 - 1.35 mg/dL   Calcium 9.1 8.4 - 10.5 mg/dL   GFR calc non Af Amer 50 (L) >90 mL/min   GFR calc Af Amer 57 (L) >90 mL/min   Anion gap 8 5 - 15  Protime-INR     Status: Abnormal   Collection Time: 03/12/14  4:38 AM  Result Value Ref Range   Prothrombin Time 27.0 (H) 11.6 - 15.2 seconds   INR 2.47 (H) 0.00 - 1.49    EKG: AF, PVCs, IVCD  IMAGING: CXR 03/09/14  EXAM: PORTABLE CHEST - 1 VIEW  COMPARISON: Chest radiograph performed 02/12/2012  FINDINGS: Vascular congestion is noted. Minimal bilateral opacities could reflect minimal interstitial edema or possibly a mild infectious process. There is elevation of the left hemidiaphragm. No pleural effusion or pneumothorax is seen.  The cardiomediastinal silhouette remains significantly enlarged. The patient is status post median sternotomy, with evidence of prior CABG. A valve  replacement is noted. No acute osseous abnormalities are identified.  IMPRESSION: Vascular congestion and cardiomegaly noted. Minimal bilateral opacities could reflect minimal interstitial edema or possibly a mild infectious process, depending on the patient's symptoms. Stable chronic elevation of the left hemidiaphragm.    IMPRESSION: Principal Problem:   Sepsis due to cellulitis Active Problems:   NSVT (nonsustained ventricular tachycardia)-in setting of K+2.9   Chronic a-fib   S/P MVR (mitral valve replacement)-St Jude 2002   S/P CABG x 3 2002   Ischemic dilated cardiomyopathy-EF  25-30% 3329   Chronic systolic heart failure   Acute on chronic systolic and diastolic heart failure, NYHA class 2   HTN (hypertension)   GIB (gastrointestinal bleeding)   Chronic renal disease, stage III   Cellulitis of right lower extremity   RECOMMENDATION: Will review with MD. He is on high dose Lopressor (100 mg BID). Replace K+. He does not appear to be in heart failure now, consider changing IV Lasix to po. 40 mg BID.   Time Spent Directly with Patient: 85 minutes  Erlene Quan [phone] beeper 03/12/2014, 5:00 PM   As above, patient seen and examined; 81 year old male with past medical history of ischemic cardiomyopathy, prior St. Jude mitral valve replacement, hypertension, hyperlipidemia and chronic systolic CHF, atrial fibrillation for evaluation of nonsustained ventricular tachycardia and congestive heart failure. Patient was admitted on January 1. He is a somewhat difficult historian but states he was at home and became weak. He sat down on the floor and then was unable to get up. He denies syncope. He also denies dyspnea, chest pain or palpitations. He has been admitted by primary care and treated for lower extremity cellulitis. He has also been placed on a Lasix drip for volume excess. He was noted to have nonsustained ventricular tachycardia on telemetry and cardiology was asked to  evaluate. On examination he has 1+ lower extremity edema which she states is chronic. Patient's volume status appears to be reasonable at present. Would discontinue Lasix drip. I will treat with Lasix 40 mg twice a day and follow renal function. He was noted to have nonsustained ventricular tachycardia on telemetry. He is asymptomatic. Given his age he is not a candidate for ICD. Continue beta blocker but change to Toprol. He is scheduled to follow-up with Dr. Radford Pax in late January. An ACE inhibitor can be considered at that time if his renal function is stable. Continue beta blocker for control of atrial fibrillation rate. Continue Coumadin for his atrial fibrillation and mechanical mitral valve.  Kirk Ruths

## 2014-03-12 NOTE — Consult Note (Addendum)
WOC wound consult note Reason for Consult: Consult requested for bilat feet.  Pt has extensive dry crusted skin to both feet and callous areas which have evolved into full thickness wounds underneath. Wound type: Trimmed dry callous areas and loose dry skin removes easily with scalpel; revealing 3 full thickness wounds. Measurement:Right inner plantar foot .5X.5X.2cm, dark red moist wound bed without odor, small amt pink drainage.Dry raised cracked skin surrounding. Left inner foot .8X.8X.2cm, red and moist without odor, small amt pink drainage.  Left outer plantar foot .5X.5X.2cm, red and moist without odor, small amt pink drainage.  Dressing procedure/placement/frequency: Daughter at bedside to assess wounds and discuss plan of care.  Eucerin cream to assist with removal of dry cracked scaly skin.  Bactroban to promote moist healing to open wounds.  Recommend pt follow-up with the outpatient wound care clinic at the [facility] for continued assessment and callous trimming.  Pt could benefit from home health assistance for dressing changes; please order if desired.  Daughter and pt verbalize understanding regarding topical treatment. Please re-consult if further assistance is needed.  Thank-you,  Julien Girt MSN, [place], [place], [place], [place]

## 2014-03-12 NOTE — Progress Notes (Signed)
ANTICOAGULATION CONSULT NOTE - Follow Up Consult  Pharmacy Consult for Coumadin Indication: atrial fibrillation and Mechanical MVR  No Known Allergies  Patient Measurements: Height: 5\' 7"  (170.2 cm) Weight: 140 lb 9.6 oz (63.776 kg) (c scale) IBW/kg (Calculated) : 66.1 Heparin Dosing Weight:   Vital Signs: Temp: 97.7 F (36.5 C) (01/04 0523) Temp Source: Oral (01/04 0523) BP: 114/80 mmHg (01/04 0523) Pulse Rate: 62 (01/04 0523)  Labs:  Recent Labs  03/09/14 1945 03/10/14 0328 03/11/14 0320 03/12/14 0438  HGB 12.3* 11.0*  --   --   HCT 37.7* 33.3*  --   --   PLT 145* 96*  --   --   LABPROT 32.2* 30.4* 29.5* 27.0*  INR 3.10* 2.88* 2.77* 2.47*  CREATININE 1.51* 1.53* 1.31 1.25  TROPONINI 0.05*  --   --   --     Estimated Creatinine Clearance: 36.9 mL/min (by C-G formula based on Cr of 1.25).   Medications:  Scheduled:  . antiseptic oral rinse  7 mL Mouth Rinse BID  . hydrocerin   Topical Daily  . metoprolol  100 mg Oral BID  . mupirocin cream   Topical Daily  . potassium chloride  40 mEq Oral Q4H  . simvastatin  10 mg Oral QHS  . sodium chloride  3 mL Intravenous Q12H  . vancomycin  750 mg Intravenous Q24H  . warfarin  2.5 mg Oral Once per day on Mon Tue Wed Fri Sat  . warfarin  5 mg Oral Once per day on Sun Thu  . Warfarin - Pharmacist Dosing Inpatient   Does not apply q1800    Assessment: 81yo male with mechanical MVR and hx AFib, admitted s/p near fall.  Head CT(-)bleed.  INR 2.47 this AM- received Coumadin 5mg  yesterday.  INR a bit down, but she received her Fri dose late & ultimately a total of 5mg  on Sat because of this.  No bleeding noted.  Goal of Therapy:  INR 2.5-3.5 Monitor platelets by anticoagulation protocol: Yes   Plan:  Continue Coumadin 2.5mg  daily x 5mg  on Sun/Thurs Continue daily INR Watch for s/s bleeding.  Gracy Bruins, PharmD Clinical Pharmacist [facility]

## 2014-03-12 NOTE — Evaluation (Signed)
Physical Therapy Evaluation Patient Details Name: Alfred Dennis MRN: [id number] DOB: 01/22/1924 Today's Date: 03/12/2014   History of Present Illness  Pt adm with sepsis due to cellulitis and acute on chronic heart failure. Past medical history of hypertension, chronic systolic heart failure, chronic A. fib, coronary artery disease status post CABG, mitral wall replacement with mechanical fall, basal cell carcinoma.  Clinical Impression  Pt admitted with above diagnosis. Pt currently with functional limitations due to the deficits listed below (see PT Problem List).  Pt will benefit from skilled PT to increase their independence and safety with mobility to allow discharge to the venue listed below.  Feel pt could benefit from ST-SNF.     Follow Up Recommendations SNF    Equipment Recommendations  None recommended by PT    Recommendations for Other Services       Precautions / Restrictions Precautions Precautions: Fall      Mobility  Bed Mobility Overal bed mobility: Needs Assistance Bed Mobility: Supine to Sit;Sit to Supine     Supine to sit: Min assist;HOB elevated Sit to supine: Min assist   General bed mobility comments: Assist to bring trunk up into sitting. Assist to bring legs back up into bed.  Transfers Overall transfer level: Needs assistance Equipment used: Rolling walker (2 wheeled) Transfers: Sit to/from Stand Sit to Stand: Min assist         General transfer comment: assist for balance  Ambulation/Gait Ambulation/Gait assistance: Min assist Ambulation Distance (Feet): 100 Feet Assistive device: Rolling walker (2 wheeled) Gait Pattern/deviations: Step-through pattern;Decreased step length - right;Decreased step length - left;Shuffle;Trunk flexed   Gait velocity interpretation: Below normal speed for age/gender General Gait Details: Verbal cues to stay closer to walker.  Stairs            Wheelchair Mobility    Modified Rankin (Stroke  Patients Only)       Balance Overall balance assessment: Needs assistance Sitting-balance support: No upper extremity supported;Feet supported Sitting balance-Leahy Scale: Good     Standing balance support: Single extremity supported Standing balance-Leahy Scale: Poor                               Pertinent Vitals/Pain Pain Assessment: No/denies pain    Home Living Family/patient expects to be discharged to:: Private residence Living Arrangements: Spouse/significant other Available Help at Discharge: Family (wife has dementia) Type of Home: House Home Access: Stairs to enter Entrance Stairs-Rails: None Entrance Stairs-Number of Steps: 1 Home Layout: One level Home Equipment: Grab bars - tub/shower;Shower seat;Bedside commode;Walker - 2 wheels      Prior Function Level of Independence: Independent               Hand Dominance   Dominant Hand: Left    Extremity/Trunk Assessment   Upper Extremity Assessment: Defer to OT evaluation           Lower Extremity Assessment: Generalized weakness         Communication   Communication: No difficulties  Cognition Arousal/Alertness: Awake/alert Behavior During Therapy: WFL for tasks assessed/performed Overall Cognitive Status: Within Functional Limits for tasks assessed                      General Comments      Exercises        Assessment/Plan    PT Assessment Patient needs continued PT services  PT Diagnosis Difficulty walking;Generalized weakness  PT Problem List Decreased strength;Decreased activity tolerance;Decreased balance;Decreased mobility;Decreased knowledge of use of DME;Decreased knowledge of precautions  PT Treatment Interventions DME instruction;Gait training;Functional mobility training;Therapeutic activities;Therapeutic exercise;Balance training;Patient/family education   PT Goals (Current goals can be found in the Care Plan section) Acute Rehab PT Goals Patient  Stated Goal: return home PT Goal Formulation: With patient Time For Goal Achievement: 03/19/14 Potential to Achieve Goals: Good    Frequency Min 3X/week   Barriers to discharge Decreased caregiver support      Co-evaluation               End of Session Equipment Utilized During Treatment: Gait belt Activity Tolerance: Patient tolerated treatment well Patient left: in bed;with call bell/phone within reach;with bed alarm set           Time: 1441-1458 PT Time Calculation (min) (ACUTE ONLY): 17 min   Charges:   PT Evaluation $Initial PT Evaluation Tier I: 1 Procedure PT Treatments $Gait Training: 8-22 mins   PT G Codes:        Zalan Shidler 03-21-2014, 3:40 PM  [facility] PT [phone]

## 2014-03-12 NOTE — Progress Notes (Signed)
[facility] PROGRESS NOTE  Filed Weights   03/09/14 2359 03/11/14 0400 03/12/14 0523  Weight: 69.7 kg (153 lb 10.6 oz) 68.221 kg (150 lb 6.4 oz) 63.776 kg (140 lb 9.6 oz)        Intake/Output Summary (Last 24 hours) at 03/12/14 0937 Last data filed at 03/12/14 0523  Gross per 24 hour  Intake 855.87 ml  Output   2550 ml  Net -1694.13 ml     Assessment/Plan: Sepsis due to cellulitis - Continue IV vancomycin on 1.1.2016. - BC NGTD, afebrile, leukocytosis improved. - Wound care input appreciated, "Eucerin cream to assist with removal of dry cracked scaly skin. Bactroban to promote moist healing to open wounds. Recommend pt follow-up with the outpatient wound care clinic at the [facility] for continued assessment and callous trimming."  Acute on Chronic systolic heart failure: - Continue IV lasix ggt, monitor lytes. Mod UOP - Cr has improved and he still vol overloaded. - Restrict fluids and sodium. - 2-d echo pending  Chronic A. fib, mitral/S/P MVR (mitral valve replacement): - Continue warfarin. - Due to near fall,CT head on 1.2.2016 showed no IC bleed. - Questionable V. tach on telemetry, however upon review was artifactual and not V. tach with multiple PVCs.  Replace potassium.  Acute kidney injury: - Resolved with diuresis. - Avoid nephrotoxic medications.  Hypokalemia - Replace as needed. - Likely due to Lasix drip.  Discharge planning - Daughter expressed concern that patient may need to go to an assisted living facility/independent living facility for placement.    Code Status: DNR Family Communication: daughter and wife  Disposition Plan: inpatient   Consultants:  Case Manger/Social Worker  Procedures: ECHO: none CT head: No acute intracranial abnormalities. Chronic atrophy and small vessel ischemic changes.  Antibiotics:  Vanc 1.1.2016  HPI/Subjective: No specific concerns.  Feeling better.  Daughter at bedside, thought about patient needing  placement given his age and multiple medical comorbidities.  Objective: Filed Vitals:   03/11/14 0400 03/11/14 1251 03/11/14 2208 03/12/14 0523  BP: 116/62 150/69 118/67 114/80  Pulse: 63 77 82 62  Temp: 98.3 F (36.8 C) 98.8 F (37.1 C) 98.1 F (36.7 C) 97.7 F (36.5 C)  TempSrc:  Oral Oral Oral  Resp: 13 16 16 17   Height:      Weight: 68.221 kg (150 lb 6.4 oz)   63.776 kg (140 lb 9.6 oz)  SpO2: 100% 98% 94% 97%     Exam:  General: Alert, awake, oriented x3, in no acute distress.  HEENT: No bruits, no goiter. +JVD Heart: Regular rate and rhythm, +S3. 3+ edema. Lungs: Good air movement, crackles at bases. Abdomen: Soft, nontender, nondistended, positive bowel sounds.     Data Reviewed: Basic Metabolic Panel:  Recent Labs Lab 03/09/14 1945 03/10/14 0328 03/11/14 0320 03/11/14 0600 03/12/14 0438  NA 137 138 136  --  137  K 5.1 4.1 3.7  --  2.9*  CL 106 104 102  --  96  CO2 22 26 27   --  33*  GLUCOSE 103* 118* 106*  --  110*  BUN 24* 22 22  --  25*  CREATININE 1.51* 1.53* 1.31  --  1.25  CALCIUM 9.3 8.8 8.6  --  9.1  MG  --   --   --  1.9  --    Liver Function Tests:  Recent Labs Lab 03/09/14 1945 03/10/14 0328  AST 111* 82*  ALT 56* 52  ALKPHOS 92 79  BILITOT 1.1 1.5*  PROT 6.8 5.8*  ALBUMIN 3.8 3.2*   No results for input(s): LIPASE, AMYLASE in the last 168 hours. No results for input(s): AMMONIA in the last 168 hours. CBC:  Recent Labs Lab 03/09/14 1945 03/10/14 0328  WBC 14.4* 10.6*  NEUTROABS 13.0*  --   HGB 12.3* 11.0*  HCT 37.7* 33.3*  MCV 92.2 91.7  PLT 145* 96*   Cardiac Enzymes:  Recent Labs Lab 03/09/14 1945  TROPONINI 0.05*   BNP (last 3 results) No results for input(s): PROBNP in the last 8760 hours. CBG: No results for input(s): GLUCAP in the last 168 hours.  Recent Results (from the past 240 hour(s))  Urine culture     Status: None   Collection Time: 03/09/14  7:47 PM  Result Value Ref Range Status   Specimen  Description URINE, RANDOM  Final   Special Requests NONE  Final   Colony Count   Final    15,000 COLONIES/ML Performed at [facility]    Culture   Final    Multiple bacterial morphotypes present, none predominant. Suggest appropriate recollection if clinically indicated. Performed at [facility]    Report Status 03/11/2014 FINAL  Final  Blood Culture (routine x 2)     Status: None (Preliminary result)   Collection Time: 03/09/14  7:52 PM  Result Value Ref Range Status   Specimen Description BLOOD RIGHT HAND  Final   Special Requests BOTTLES DRAWN AEROBIC AND ANAEROBIC 5 CC  Final   Culture   Final           BLOOD CULTURE RECEIVED NO GROWTH TO DATE CULTURE WILL BE HELD FOR 5 DAYS BEFORE ISSUING A FINAL NEGATIVE REPORT Performed at [facility]    Report Status PENDING  Incomplete  Blood Culture (routine x 2)     Status: None (Preliminary result)   Collection Time: 03/09/14  7:57 PM  Result Value Ref Range Status   Specimen Description BLOOD LEFT ARM  Final   Special Requests BOTTLES DRAWN AEROBIC AND ANAEROBIC 5 CC  Final   Culture   Final           BLOOD CULTURE RECEIVED NO GROWTH TO DATE CULTURE WILL BE HELD FOR 5 DAYS BEFORE ISSUING A FINAL NEGATIVE REPORT Performed at [facility]    Report Status PENDING  Incomplete     Studies: No results found.  Scheduled Meds: . antiseptic oral rinse  7 mL Mouth Rinse BID  . hydrocerin   Topical Daily  . metoprolol  100 mg Oral BID  . mupirocin cream   Topical Daily  . potassium chloride  40 mEq Oral Q4H  . simvastatin  10 mg Oral QHS  . sodium chloride  3 mL Intravenous Q12H  . vancomycin  750 mg Intravenous Q24H  . warfarin  2.5 mg Oral Once per day on Mon Tue Wed Fri Sat  . warfarin  5 mg Oral Once per day on Sun Thu  . Warfarin - Pharmacist Dosing Inpatient   Does not apply q1800   Continuous Infusions: . furosemide (LASIX) infusion 8 mg/hr (03/12/14 0715)     Raymont Andreoni A, MD [facility] If 7PM-7AM, please contact night-coverage at [email or web address], password [facility] 03/12/2014, 9:37 AM  LOS: 3 days

## 2014-03-12 NOTE — Care Management Note (Signed)
    Page 1 of 1   03/14/2014     5:47:07 PM CARE MANAGEMENT NOTE 03/14/2014  Patient:  Alfred Dennis, Alfred Dennis   Account Number:  [id number]  Date Initiated:  03/12/2014  Documentation initiated by:  Lenox Bink  Subjective/Objective Assessment:   Pt adm on 03/09/14 with sepsis secondary to cellulitis.  PTA, pt resides at home with wife, who has dementia.     Action/Plan:   PT recommending SNF at dc.  CSW consulted to facilitate dc to SNF when medically stable for dc.   Anticipated DC Date:  03/15/2014   Anticipated DC Plan:  SKILLED NURSING FACILITY  In-house referral  Clinical Social Worker      DC Planning Services  CM consult      Choice offered to / List presented to:             Status of service:  Completed, signed off Medicare Important Message given?  YES (If response is "NO", the following Medicare IM given date fields will be blank) Date Medicare IM given:  03/12/2014 Medicare IM given by:  Briton Sellman Date Additional Medicare IM given:   Additional Medicare IM given by:    Discharge Disposition:  [place]  Per UR Regulation:  Reviewed for med. necessity/level of care/duration of stay  If discussed at [place] of Stay Meetings, dates discussed:    Comments:  03/14/14 Ellan Lambert, RN, BSN [phone] Pt discharged to SNF today, per CSW arrangements.

## 2014-03-12 NOTE — Progress Notes (Signed)
Echocardiogram 2D Echocardiogram has been performed.  Alfred Dennis 03/12/2014, 3:08 PM

## 2014-03-13 LAB — BASIC METABOLIC PANEL
Anion gap: 11 (ref 5–15)
BUN: 38 mg/dL — AB (ref 6–23)
CHLORIDE: 98 meq/L (ref 96–112)
CO2: 28 mmol/L (ref 19–32)
Calcium: 9 mg/dL (ref 8.4–10.5)
Creatinine, Ser: 1.13 mg/dL (ref 0.50–1.35)
GFR calc Af Amer: 65 mL/min — ABNORMAL LOW (ref >90)
GFR calc non Af Amer: 56 mL/min — ABNORMAL LOW (ref >90)
GLUCOSE: 97 mg/dL (ref 70–99)
Potassium: 4.5 mmol/L (ref 3.5–5.1)
Sodium: 137 mmol/L (ref 135–145)

## 2014-03-13 LAB — CBC
HCT: 38.7 % — ABNORMAL LOW (ref 39.0–52.0)
HEMOGLOBIN: 13 g/dL (ref 13.0–17.0)
MCH: 30.4 pg (ref 26.0–34.0)
MCHC: 33.6 g/dL (ref 30.0–36.0)
MCV: 90.4 fL (ref 78.0–100.0)
PLATELETS: 160 10*3/uL (ref 150–400)
RBC: 4.28 MIL/uL (ref 4.22–5.81)
RDW: 14.7 % (ref 11.5–15.5)
WBC: 6.3 10*3/uL (ref 4.0–10.5)

## 2014-03-13 LAB — PROTIME-INR
INR: 2.82 — AB (ref 0.00–1.49)
PROTHROMBIN TIME: 29.9 s — AB (ref 11.6–15.2)

## 2014-03-13 LAB — MAGNESIUM: Magnesium: 2.1 mg/dL (ref 1.5–2.5)

## 2014-03-13 MED ORDER — DOXYCYCLINE HYCLATE 100 MG PO TABS
100.0000 mg | ORAL_TABLET | Freq: Two times a day (BID) | ORAL | Status: DC
Start: 2014-03-13 — End: 2014-03-14
  Administered 2014-03-13 – 2014-03-14 (×2): 100 mg via ORAL
  Filled 2014-03-13 (×3): qty 1

## 2014-03-13 NOTE — Progress Notes (Signed)
Patient Name: Alfred Dennis Date of Encounter: 03/13/2014     Principal Problem:   Sepsis due to cellulitis Active Problems:   Chronic a-fib   HTN (hypertension)   S/P MVR (mitral valve replacement)-St Jude 2002   GIB (gastrointestinal bleeding)   S/P CABG x 3 2002   Ischemic dilated cardiomyopathy-EF 25-30% 4270   Chronic systolic heart failure   Chronic renal disease, stage III   Acute on chronic systolic and diastolic heart failure, NYHA class 2   Cellulitis of right lower extremity   NSVT (nonsustained ventricular tachycardia)    SUBJECTIVE  Denies any discomfrot, no CP or SOB  CURRENT MEDS . antiseptic oral rinse  7 mL Mouth Rinse BID  . furosemide  40 mg Oral BID  . hydrocerin   Topical Daily  . metoprolol succinate  100 mg Oral BID  . mupirocin cream   Topical Daily  . simvastatin  10 mg Oral QHS  . sodium chloride  3 mL Intravenous Q12H  . vancomycin  750 mg Intravenous Q24H  . warfarin  2.5 mg Oral Once per day on Mon Tue Wed Fri Sat  . warfarin  5 mg Oral Once per day on Sun Thu  . Warfarin - Pharmacist Dosing Inpatient   Does not apply q1800    OBJECTIVE  Filed Vitals:   03/12/14 1319 03/12/14 1954 03/12/14 2153 03/13/14 0556  BP: 107/47 100/64 108/70 108/66  Pulse: 72 110 76 71  Temp: 98.2 F (36.8 C) 97.4 F (36.3 C)  97.9 F (36.6 C)  TempSrc: Oral Oral  Oral  Resp: 18 18  18   Height:      Weight:    137 lb 2 oz (62.2 kg)  SpO2: 98% 98%  100%    Intake/Output Summary (Last 24 hours) at 03/13/14 0915 Last data filed at 03/13/14 0857  Gross per 24 hour  Intake 970.94 ml  Output   1225 ml  Net -254.06 ml   Filed Weights   03/11/14 0400 03/12/14 0523 03/13/14 0556  Weight: 150 lb 6.4 oz (68.221 kg) 140 lb 9.6 oz (63.776 kg) 137 lb 2 oz (62.2 kg)    PHYSICAL EXAM  General: Pleasant, NAD. ?mental status Neuro: Alert and oriented. Moves all extremities spontaneously. Psych: Normal affect. HEENT:  Normal  Neck: Supple without bruits  or JVD. Lungs:  Resp regular and unlabored, largely CTA, mild L basilar rale Heart: irregular.  no s3, s4, or murmurs. Abdomen: Soft, non-tender, non-distended, BS + x 4.  Extremities: No clubbing, cyanosis. DP/PT/Radials 2+ and equal bilaterally. 1+ bilateral pitting edema  Accessory Clinical Findings  CBC  Recent Labs  03/13/14 0323  WBC 6.3  HGB 13.0  HCT 38.7*  MCV 90.4  PLT 623   Basic Metabolic Panel  Recent Labs  03/11/14 0600 03/12/14 0438 03/13/14 0323  NA  --  137 137  K  --  2.9* 4.5  CL  --  96 98  CO2  --  33* 28  GLUCOSE  --  110* 97  BUN  --  25* 38*  CREATININE  --  1.25 1.13  CALCIUM  --  9.1 9.0  MG 1.9  --  2.1     TELE A-fib with HR 60-70s    ECG  No new EKG  Echocardiogram 03/12/2014  LV EF: 25% -  30%  ------------------------------------------------------------------- Indications:   Dyspnea 786.09.  ------------------------------------------------------------------- History:  PMH:  Coronary artery disease. Mitral valve disease, post repair. Risk factors:  Dyslipidemia.  ------------------------------------------------------------------- Study Conclusions  - Left ventricle: There was mild concentric hypertrophy. Systolic function was severely reduced. The estimated ejection fraction was in the range of 25% to 30%. Diffuse hypokinesis. - Ventricular septum: Septal motion showed paradox. - Aortic valve: Moderate thickening and calcification, consistent with sclerosis. - Mitral valve: A St. Jude Medical mechanical prosthesis was present. Valve area by continuity equation (using LVOT flow): 1.2 cm^2. - Left atrium: The atrium was severely dilated. - Right ventricle: The cavity size was severely dilated. Wall thickness was normal. - Pulmonic valve: There was trivial regurgitation. - Pulmonary arteries: PA peak pressure: 38 mm Hg (S).  Impressions:  - The right ventricular systolic pressure was increased  consistent with mild pulmonary hypertension.    Radiology/Studies  Ct Head Wo Contrast  03/10/2014   CLINICAL DATA:  Patient lost balance wall walking in slipped down to the floor. Chronic anticoagulation.  EXAM: CT HEAD WITHOUT CONTRAST  TECHNIQUE: Contiguous axial images were obtained from the base of the skull through the vertex without intravenous contrast.  COMPARISON:  None.  FINDINGS: Diffuse cerebral atrophy. Ventricular dilatation consistent with central atrophy. Low-attenuation changes in the deep white matter consistent with small vessel ischemia. Small focal area of encephalomalacia in the left occipital region consistent with old infarct. No mass effect or midline shift. No abnormal extra-axial fluid collections. Gray-white matter junctions are distinct. Basal cisterns are not effaced. No evidence of acute intracranial hemorrhage. No depressed skull fractures. Visualized paranasal sinuses and mastoid air cells are not opacified.  IMPRESSION: No acute intracranial abnormalities. Chronic atrophy and small vessel ischemic changes. 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   Electronically Signed   By: Lucienne Capers M.D.   On: 03/10/2014 01:07   Dg Chest Port 1 View  03/09/2014   CLINICAL DATA:  Acute onset of generalized weakness for 1 day. Initial encounter.  EXAM: PORTABLE CHEST - 1 VIEW  COMPARISON:  Chest radiograph performed 02/12/2012  FINDINGS: Vascular congestion is noted. Minimal bilateral opacities could reflect minimal interstitial edema or possibly a mild infectious process. There is elevation of the left hemidiaphragm. No pleural effusion or pneumothorax is seen.  The cardiomediastinal silhouette remains significantly enlarged. The patient is status post median sternotomy, with evidence of prior CABG. A valve replacement is noted. No acute osseous abnormalities are identified.  IMPRESSION: Vascular congestion and cardiomegaly noted. Minimal bilateral opacities could reflect minimal  interstitial edema or possibly a mild infectious process, depending on the patient's symptoms. Stable chronic elevation of the left hemidiaphragm.   Electronically Signed   By: Garald Balding M.D.   On: 03/09/2014 19:50    ASSESSMENT AND PLAN  1. NSVT  - not a candidate for ICD due to age and comorbidities  - metoprolol tartrate changed to toprol XL 100mg  BID last night, continue to have occasional NSVT however improved.   - likely can be discharged to SNF soon  2. Acute on chronic systolic HF  - off lasix drip, changed to PO lasix 40mg  BID yesterday  - no ACEI given renal dysfunction  - unclear lasix dosage at home, currently on 40mg  BID, however per home med, he is on 40mg  TID and 20mg  TID, per daughter's note, it is 60mg  daily. Will continue 40mg  BID for now.   3. LE cellulitis 4. Atrial fibrillation: rate controlled, on coumadin 5. ICM  - Echo 03/12/2013 EF 25-30%, diffuse hypokinesis, mechanical MV present, PA peak pressure 9mmHg  6. H/o St Jude mechanical MVR  - on coumaidn  7. HTN 8. HLD   Signed, Almyra Deforest PA-C Pager: [phone]  As above, patient seen and examined. He denies dyspnea, chest pain, palpitations or syncope. Nonsustained ventricular tachycardia is noted on telemetry and patient does have reduced LV function. However given his age he is not a candidate for ICD. Continue beta blocker. His volume status appears to be stable. BUN creatinine ratio mildly increased. Lasix drip was discontinued yesterday. Continue Lasix 40 mg twice a day. Would check potassium and renal function in approximately 3 days with results to Dr. Radford Pax. Follow-up with Dr. Radford Pax later this month as scheduled. If his blood pressure allows and renal function stable would consider ACE inhibitor at that time. Management of lower extremity cellulitis per primary care. We will sign off. Please call with questions. Kirk Ruths

## 2014-03-13 NOTE — Progress Notes (Signed)
Pt with 5 beat run PVCs this AM. Pt asleep at time in NAD. Will continue to monitor. Ronnette Hila, RN

## 2014-03-13 NOTE — Progress Notes (Addendum)
[facility] PROGRESS NOTE  Filed Weights   03/11/14 0400 03/12/14 0523 03/13/14 0556  Weight: 68.221 kg (150 lb 6.4 oz) 63.776 kg (140 lb 9.6 oz) 62.2 kg (137 lb 2 oz)        Intake/Output Summary (Last 24 hours) at 03/13/14 0923 Last data filed at 03/13/14 0857  Gross per 24 hour  Intake 970.94 ml  Output   1225 ml  Net -254.06 ml     Assessment/Plan: Sepsis due to cellulitis - Continue IV vancomycin on 1.1.2016.  As patient is anticoagulated on Coumadin and consider transitioning to doxycycline instead of Bactrim at the time of discharge.  - BC NGTD, afebrile, leukocytosis improved. - Wound care input appreciated, "Eucerin cream to assist with removal of dry cracked scaly skin. Bactroban to promote moist healing to open wounds. Recommend pt follow-up with the outpatient wound care clinic at the [facility] for continued assessment and callous trimming."  Addendum: Discontinue vancomycin and start patient on doxycyline in anticipation for possible discharge tomorrow.  Acute on Chronic systolic heart failure: - Appreciate cardiology input, edema significantly improved.  Patient transition from IV lasix ggt to oral lasix 40 mg po BID. - Cr has improved. - Restrict fluids and sodium. - 2-d echo EF 25-30%, diffuse hypokinesis.  Chronic A. fib, mitral/S/P MVR (mitral valve replacement): - Continue warfarin. - Due to near fall, CT head on 1.2.2016 showed no IC bleed.  Nonsustained ventricular tachycardia - Electrolytes adequately replaced. - Cardiology input appreciated.  Patient was deemed not a candidate for ICD given his age.  Acute kidney injury: - Resolved with diuresis. - Avoid nephrotoxic medications.  Hypokalemia - Resolved with replacement.  Discharge planning - Daughter expressed concern that patient may need to go to an assisted living facility/independent living facility for placement.    Code Status: DNR Family Communication: No family at bedside.    Disposition Plan: inpatient, patient will need skilled nursing facility at the time of discharge.   Consultants:  Case Manger/Social Worker.  Cardiology  Procedures: ECHO on 03/12/2014: EF 25-30%, diffuse hypokinesis. CT head: No acute intracranial abnormalities. Chronic atrophy and small vessel ischemic changes.  Antibiotics:  Vanc 1.1.2016 >>  HPI/Subjective: No specific concerns.  Feeling better.  Cardiology evaluated the patient on runs of NSVT.  Swelling and redness significantly improved in his legs.  Objective: Filed Vitals:   03/12/14 1319 03/12/14 1954 03/12/14 2153 03/13/14 0556  BP: 107/47 100/64 108/70 108/66  Pulse: 72 110 76 71  Temp: 98.2 F (36.8 C) 97.4 F (36.3 C)  97.9 F (36.6 C)  TempSrc: Oral Oral  Oral  Resp: 18 18  18   Height:      Weight:    62.2 kg (137 lb 2 oz)  SpO2: 98% 98%  100%     Exam:  Physical Exam: General: Awake, Oriented, No acute distress. HEENT: EOMI. Neck: Supple CV: S1 and S2 Lungs: Clear to ascultation bilaterally Abdomen: Soft, Nontender, Nondistended, +bowel sounds. Ext: Good pulses.  1-2+ lower extremity edema, significantly improved.  Erythema in lower extremities improved.  Data Reviewed: Basic Metabolic Panel:  Recent Labs Lab 03/09/14 1945 03/10/14 0328 03/11/14 0320 03/11/14 0600 03/12/14 0438 03/13/14 0323  NA 137 138 136  --  137 137  K 5.1 4.1 3.7  --  2.9* 4.5  CL 106 104 102  --  96 98  CO2 22 26 27   --  33* 28  GLUCOSE 103* 118* 106*  --  110* 97  BUN 24*  22 22  --  25* 38*  CREATININE 1.51* 1.53* 1.31  --  1.25 1.13  CALCIUM 9.3 8.8 8.6  --  9.1 9.0  MG  --   --   --  1.9  --  2.1   Liver Function Tests:  Recent Labs Lab 03/09/14 1945 03/10/14 0328  AST 111* 82*  ALT 56* 52  ALKPHOS 92 79  BILITOT 1.1 1.5*  PROT 6.8 5.8*  ALBUMIN 3.8 3.2*   No results for input(s): LIPASE, AMYLASE in the last 168 hours. No results for input(s): AMMONIA in the last 168 hours. CBC:  Recent  Labs Lab 03/09/14 1945 03/10/14 0328 03/13/14 0323  WBC 14.4* 10.6* 6.3  NEUTROABS 13.0*  --   --   HGB 12.3* 11.0* 13.0  HCT 37.7* 33.3* 38.7*  MCV 92.2 91.7 90.4  PLT 145* 96* 160   Cardiac Enzymes:  Recent Labs Lab 03/09/14 1945  TROPONINI 0.05*   BNP (last 3 results) No results for input(s): PROBNP in the last 8760 hours. CBG: No results for input(s): GLUCAP in the last 168 hours.  Recent Results (from the past 240 hour(s))  Urine culture     Status: None   Collection Time: 03/09/14  7:47 PM  Result Value Ref Range Status   Specimen Description URINE, RANDOM  Final   Special Requests NONE  Final   Colony Count   Final    15,000 COLONIES/ML Performed at [facility]    Culture   Final    Multiple bacterial morphotypes present, none predominant. Suggest appropriate recollection if clinically indicated. Performed at [facility]    Report Status 03/11/2014 FINAL  Final  Blood Culture (routine x 2)     Status: None (Preliminary result)   Collection Time: 03/09/14  7:52 PM  Result Value Ref Range Status   Specimen Description BLOOD RIGHT HAND  Final   Special Requests BOTTLES DRAWN AEROBIC AND ANAEROBIC 5 CC  Final   Culture   Final           BLOOD CULTURE RECEIVED NO GROWTH TO DATE CULTURE WILL BE HELD FOR 5 DAYS BEFORE ISSUING A FINAL NEGATIVE REPORT Performed at [facility]    Report Status PENDING  Incomplete  Blood Culture (routine x 2)     Status: None (Preliminary result)   Collection Time: 03/09/14  7:57 PM  Result Value Ref Range Status   Specimen Description BLOOD LEFT ARM  Final   Special Requests BOTTLES DRAWN AEROBIC AND ANAEROBIC 5 CC  Final   Culture   Final           BLOOD CULTURE RECEIVED NO GROWTH TO DATE CULTURE WILL BE HELD FOR 5 DAYS BEFORE ISSUING A FINAL NEGATIVE REPORT Performed at [facility]    Report Status PENDING  Incomplete     Studies: No results found.  Scheduled Meds: . antiseptic oral  rinse  7 mL Mouth Rinse BID  . furosemide  40 mg Oral BID  . hydrocerin   Topical Daily  . metoprolol succinate  100 mg Oral BID  . mupirocin cream   Topical Daily  . simvastatin  10 mg Oral QHS  . sodium chloride  3 mL Intravenous Q12H  . vancomycin  750 mg Intravenous Q24H  . warfarin  2.5 mg Oral Once per day on Mon Tue Wed Fri Sat  . warfarin  5 mg Oral Once per day on Sun Thu  . Warfarin - Pharmacist Dosing  Inpatient   Does not apply q1800   Continuous Infusions:     Akaylah Lalley A, MD [facility] If 7PM-7AM, please contact night-coverage at [email or web address], password [facility] 03/13/2014, 9:23 AM  LOS: 4 days

## 2014-03-13 NOTE — Progress Notes (Signed)
ANTICOAGULATION CONSULT NOTE - Follow Up Consult  Pharmacy Consult for Coumadin Indication: atrial fibrillation and mechanical heart valve  No Known Allergies  Patient Measurements: Height: 5\' 7"  (170.2 cm) Weight: 137 lb 2 oz (62.2 kg) IBW/kg (Calculated) : 66.1 Heparin Dosing Weight:   Vital Signs: Temp: 97.9 F (36.6 C) (01/05 0556) Temp Source: Oral (01/05 0556) BP: 108/66 mmHg (01/05 0556) Pulse Rate: 71 (01/05 0556)  Labs:  Recent Labs  03/11/14 0320 03/12/14 0438 03/13/14 0323  HGB  --   --  13.0  HCT  --   --  38.7*  PLT  --   --  160  LABPROT 29.5* 27.0* 29.9*  INR 2.77* 2.47* 2.82*  CREATININE 1.31 1.25 1.13    Estimated Creatinine Clearance: 39.8 mL/min (by C-G formula based on Cr of 1.13).   Medications:  Scheduled:  . antiseptic oral rinse  7 mL Mouth Rinse BID  . furosemide  40 mg Oral BID  . hydrocerin   Topical Daily  . metoprolol succinate  100 mg Oral BID  . mupirocin cream   Topical Daily  . simvastatin  10 mg Oral QHS  . sodium chloride  3 mL Intravenous Q12H  . vancomycin  750 mg Intravenous Q24H  . warfarin  2.5 mg Oral Once per day on Mon Tue Wed Fri Sat  . warfarin  5 mg Oral Once per day on Sun Thu  . Warfarin - Pharmacist Dosing Inpatient   Does not apply q1800    Assessment: 81yo male on Coumadin for AFib and mechanical valve.  INR 2.82 this AM, pltc 160.  No bleeding noted.  Pt is on home dose.  Goal of Therapy:  INR 2.5-3.5 Monitor platelets by anticoagulation protocol: Yes   Plan:  Continue home dose F.U in AM  Gracy Bruins, PharmD [facility]

## 2014-03-13 NOTE — Progress Notes (Signed)
Physical Therapy Treatment Patient Details Name: Alfred Dennis MRN: [id number] DOB: 13-Oct-1923 Today's Date: 28-Mar-2014    History of Present Illness Pt adm with sepsis due to cellulitis and acute on chronic heart failure. Past medical history of hypertension, chronic systolic heart failure, chronic A. fib, coronary artery disease status post CABG, mitral wall replacement with mechanical fall, basal cell carcinoma.    PT Comments    Pt making steady progress.  Follow Up Recommendations  SNF     Equipment Recommendations  None recommended by PT    Recommendations for Other Services       Precautions / Restrictions Precautions Precautions: Fall    Mobility  Bed Mobility Overal bed mobility: Needs Assistance Bed Mobility: Supine to Sit;Sit to Supine     Supine to sit: Min assist;HOB elevated Sit to supine: Min assist      Transfers Overall transfer level: Needs assistance Equipment used: Rolling walker (2 wheeled) Transfers: Sit to/from Stand Sit to Stand: Min assist         General transfer comment: assist for balance and to bring hips up from low surface.  Ambulation/Gait Ambulation/Gait assistance: Min assist Ambulation Distance (Feet): 170 Feet Assistive device: Rolling walker (2 wheeled) Gait Pattern/deviations: Step-through pattern;Decreased step length - right;Decreased step length - left;Shuffle;Trunk flexed   Gait velocity interpretation: Below normal speed for age/gender General Gait Details: Verbal cues to stay closer to walker.   Stairs            Wheelchair Mobility    Modified Rankin (Stroke Patients Only)       Balance Overall balance assessment: Needs assistance Sitting-balance support: No upper extremity supported;Feet supported Sitting balance-Leahy Scale: Good     Standing balance support: Single extremity supported Standing balance-Leahy Scale: Poor                      Cognition Arousal/Alertness:  Awake/alert Behavior During Therapy: WFL for tasks assessed/performed Overall Cognitive Status: Within Functional Limits for tasks assessed                      Exercises      General Comments        Pertinent Vitals/Pain Pain Assessment: No/denies pain    Home Living Family/patient expects to be discharged to:: Private residence Living Arrangements: Spouse/significant other Available Help at Discharge: Family (wife has dementia) Type of Home: House Home Access: Stairs to enter Entrance Stairs-Rails: None Home Layout: One level Home Equipment: Grab bars - tub/shower;Shower seat;Bedside commode;Walker - 2 wheels      Prior Function Level of Independence: Independent          PT Goals (current goals can now be found in the care plan section) Progress towards PT goals: Progressing toward goals    Frequency  Min 2X/week    PT Plan Current plan remains appropriate;Frequency needs to be updated    Co-evaluation             End of Session Equipment Utilized During Treatment: Gait belt Activity Tolerance: Patient tolerated treatment well Patient left: in chair;with call bell/phone within reach;with chair alarm set     Time: 1233-1250 PT Time Calculation (min) (ACUTE ONLY): 17 min  Charges:  $Gait Training: 8-22 mins                    G Codes:      Ilma Achee 03-28-2014, 2:34 PM  [facility] PT [phone]

## 2014-03-13 NOTE — Clinical Social Work Placement (Signed)
     Clinical Social Work Department CLINICAL SOCIAL WORK PLACEMENT NOTE 03/14/2014  Patient:  Alfred Dennis, Alfred Dennis  Account Number:  [id number] Admit date:  03/09/2014  Clinical Social Worker:  Butch Penny Margrit Minner, LCSW  Date/time:  03/13/2014 03:42 PM  Clinical Social Work is seeking post-discharge placement for this patient at the following level of care:   SKILLED NURSING   (*CSW will update this form in Epic as items are completed)   03/13/2014  Patient/family provided with [place] Department of Clinical Social Works list of facilities offering this level of care within the geographic area requested by the patient (or if unable, by the patients family).  03/13/2014  Patient/family informed of their freedom to choose among providers that offer the needed level of care, that participate in Medicare, Medicaid or managed care program needed by the patient, have an available bed and are willing to accept the patient.  03/13/2014  Patient/family informed of [facility] ownership interest in [facility], as well as of the fact that they are under no obligation to receive care at this facility.  PASARR submitted to EDS on 03/13/2014 PASARR number received on 03/13/2014  FL2 transmitted to all facilities in geographic area requested by pt/family on  03/13/2014 FL2 transmitted to all facilities within larger geographic area on   Patient informed that his/her managed care company has contracts with or will negotiate with  certain facilities, including the following:   NA     Patient/family informed of bed offers received:  03/13/2014 Patient chooses bed at [place] Physician recommends and patient chooses bed at    Patient to be transferred to [place] on  03/14/2014 Patient to be transferred to facility by [facility]ulance [facility]) Patient and family notified of transfer on 03/14/2014 Name of family member notified:  Alfred Dennis - Daughter  The following physician request  were entered in Epic: Physician Request  Please prepare priority discharge summary and prescriptions.  Please sign FL2.    Additional Comments: 03/14/14. Patient is stable for d/c today per MD.  Family had chosen [facility] but they did not have a bed for patient today. Discussed other bed choices and patient/daughter chose [facility]. DC today for short term SNF. Patient is agreeable. Wife at bedside; she is alert but has extensive short term memory. CSW spent a great deal of time with pateint's daughter [name]. Provided resource information for the [employer], Meals on Wheels, Alzheimers Support group and Association and [employer].  Daughter verbalizes feelings of exhaustion and being overwhelmed with current illness of her father and trying to care for her mother who has memory care issues and cannot be left alone. She states that she has some type of "panic attack" and had to come to the emergency room.  Discussed coping skills and reinforced need for caregiver support and self care. She verbalized understanding and stated that she would be asking her brother and sister-in law for help.  Daughter stated that she was feeling better.  Nursing notified to call report to SNF; CSW will sign off.  Lorie Phenix. [place], [place]

## 2014-03-13 NOTE — Evaluation (Signed)
Occupational Therapy Evaluation Patient Details Name: Alfred Dennis MRN: [id number] DOB: [birth year] Today's Date: 03/13/2014    History of Present Illness Pt adm with sepsis due to cellulitis and acute on chronic heart failure. Past medical history of hypertension, chronic systolic heart failure, chronic A. fib, coronary artery disease status post CABG, mitral wall replacement with mechanical fall, basal cell carcinoma.   Clinical Impression   Pt admitted with sepsis. Pt currently with functional limitations due to the deficits listed below (see OT Problem List).  Pt will benefit from skilled OT to increase their safety and independence with ADL and functional mobility for ADL to facilitate discharge to venue listed below.      Follow Up Recommendations  SNF    Equipment Recommendations  None recommended by OT       Precautions / Restrictions Precautions Precautions: Fall      Mobility Bed Mobility Overal bed mobility: Needs Assistance Bed Mobility: Supine to Sit;Sit to Supine     Supine to sit: Min assist;HOB elevated Sit to supine: Min assist      Transfers Overall transfer level: Needs assistance Equipment used: Rolling walker (2 wheeled)   Sit to Stand: Min assist                   ADL Overall ADL's : Needs assistance/impaired     Grooming: Set up;Sitting   Upper Body Bathing: Set up;Sitting   Lower Body Bathing: Moderate assistance;Sit to/from stand   Upper Body Dressing : Set up;Sitting   Lower Body Dressing: Moderate assistance;Sit to/from stand   Toilet Transfer: Minimal assistance;RW   Toileting- Clothing Manipulation and Hygiene: Moderate assistance;Sit to/from stand               Vision                     Perception     Praxis      Pertinent Vitals/Pain Pain Assessment: No/denies pain     Hand Dominance Left   Extremity/Trunk Assessment Upper Extremity Assessment Upper Extremity Assessment: Generalized  weakness           Communication Communication Communication: No difficulties   Cognition Arousal/Alertness: Awake/alert Behavior During Therapy: WFL for tasks assessed/performed Overall Cognitive Status: Within Functional Limits for tasks assessed                     General Comments    Family wants masonic home           Home Living Family/patient expects to be discharged to:: Private residence Living Arrangements: Spouse/significant other Available Help at Discharge: Family (wife has dementia) Type of Home: House Home Access: Stairs to enter Technical brewer of Steps: 1 Entrance Stairs-Rails: None Home Layout: One level     Bathroom Shower/Tub: Tub/shower unit Shower/tub characteristics: Curtain       Home Equipment: Grab bars - tub/shower;Shower seat;Bedside commode;Walker - 2 wheels          Prior Functioning/Environment Level of Independence: Independent             OT Diagnosis: Generalized weakness   OT Problem List: Decreased strength;Decreased activity tolerance   OT Treatment/Interventions: Self-care/ADL training;DME and/or AE instruction;Patient/family education    OT Goals(Current goals can be found in the care plan section)    OT Frequency: Min 2X/week   Barriers to D/C:               End of Session Nurse Communication:  Mobility status  Activity Tolerance: Patient tolerated treatment well Patient left: in chair;with call bell/phone within reach   Time: 1206-1222 OT Time Calculation (min): 16 min Charges:  OT General Charges $OT Visit: 1 Procedure OT Evaluation $Initial OT Evaluation Tier I: 1 Procedure OT Treatments $Self Care/Home Management : 8-22 mins G-Codes:    Payton Mccallum D 03-Apr-2014, 1:05 PM

## 2014-03-13 NOTE — Discharge Instructions (Signed)
Information on my medicine - Coumadin   (Warfarin)  This medication education was reviewed with me or my healthcare representative as part of my discharge preparation.  The pharmacist that spoke with me during my hospital stay was:  Jaquita Folds, [facility]  Why was Coumadin prescribed for you? Coumadin was prescribed for you because you have a blood clot or a medical condition that can cause an increased risk of forming blood clots. Blood clots can cause serious health problems by blocking the flow of blood to the heart, lung, or brain. Coumadin can prevent harmful blood clots from forming. As a reminder your indication for Coumadin is:   Blood Clot Prevention After Heart Valve Surgery  What test will check on my response to Coumadin? While on Coumadin (warfarin) you will need to have an INR test regularly to ensure that your dose is keeping you in the desired range. The INR (international normalized ratio) number is calculated from the result of the laboratory test called prothrombin time (PT).  If an INR APPOINTMENT HAS NOT ALREADY BEEN MADE FOR YOU please schedule an appointment to have this lab work done by your health care provider within 7 days. Your INR goal is usually a number between:  2 to 3 or your provider may give you a more narrow range like 2-2.5.  Ask your health care provider during an office visit what your goal INR is.  What  do you need to  know  About  COUMADIN? Take Coumadin (warfarin) exactly as prescribed by your healthcare provider about the same time each day.  DO NOT stop taking without talking to the doctor who prescribed the medication.  Stopping without other blood clot prevention medication to take the place of Coumadin may increase your risk of developing a new clot or stroke.  Get refills before you run out.  What do you do if you miss a dose? If you miss a dose, take it as soon as you remember on the same day then continue your regularly scheduled regimen the next  day.  Do not take two doses of Coumadin at the same time.  Important Safety Information A possible side effect of Coumadin (Warfarin) is an increased risk of bleeding. You should call your healthcare provider right away if you experience any of the following: ? Bleeding from an injury or your nose that does not stop. ? Unusual colored urine (red or dark brown) or unusual colored stools (red or black). ? Unusual bruising for unknown reasons. ? A serious fall or if you hit your head (even if there is no bleeding).  Some foods or medicines interact with Coumadin (warfarin) and might alter your response to warfarin. To help avoid this: ? Eat a balanced diet, maintaining a consistent amount of Vitamin K. ? Notify your provider about major diet changes you plan to make. ? Avoid alcohol or limit your intake to 1 drink for women and 2 drinks for men per day. (1 drink is 5 oz. wine, 12 oz. beer, or 1.5 oz. liquor.)  Make sure that ANY health care provider who prescribes medication for you knows that you are taking Coumadin (warfarin).  Also make sure the healthcare provider who is monitoring your Coumadin knows when you have started a new medication including herbals and non-prescription products.  Coumadin (Warfarin)  Major Drug Interactions  Increased Warfarin Effect Decreased Warfarin Effect  Alcohol (large quantities) Antibiotics (esp. Septra/Bactrim, Flagyl, Cipro) Amiodarone (Cordarone) Aspirin (ASA) Cimetidine (Tagamet) Megestrol (Megace) NSAIDs (  ibuprofen, naproxen, etc.) °Piroxicam (Feldene) °Propafenone (Rythmol SR) °Propranolol (Inderal) °Isoniazid (INH) °Posaconazole (Noxafil) Barbiturates (Phenobarbital) °Carbamazepine (Tegretol) °Chlordiazepoxide (Librium) °Cholestyramine (Questran) °Griseofulvin °Oral Contraceptives °Rifampin °Sucralfate (Carafate) °Vitamin K  ° °Coumadin® (Warfarin) Major Herbal Interactions  °Increased Warfarin Effect Decreased Warfarin Effect   °Garlic °Ginseng °Ginkgo biloba Coenzyme Q10 °Green tea °St. John’s wort   ° °Coumadin® (Warfarin) FOOD Interactions  °Eat a consistent number of servings per week of foods HIGH in Vitamin K °(1 serving = ½ cup)  °Collards (cooked, or boiled & drained) °Kale (cooked, or boiled & drained) °Mustard greens (cooked, or boiled & drained) °Parsley *serving size only = ¼ cup °Spinach (cooked, or boiled & drained) °Swiss chard (cooked, or boiled & drained) °Turnip greens (cooked, or boiled & drained)  °Eat a consistent number of servings per week of foods MEDIUM-HIGH in Vitamin K °(1 serving = 1 cup)  °Asparagus (cooked, or boiled & drained) °Broccoli (cooked, boiled & drained, or raw & chopped) °Brussel sprouts (cooked, or boiled & drained) *serving size only = ½ cup °Lettuce, raw (green leaf, endive, romaine) °Spinach, raw °Turnip greens, raw & chopped  ° °These websites have more information on Coumadin (warfarin):  www.coumadin.com; °www.ahrq.gov/consumer/coumadin.htm; ° ° °

## 2014-03-14 ENCOUNTER — Other Ambulatory Visit: Payer: Medicare Other

## 2014-03-14 DIAGNOSIS — R791 Abnormal coagulation profile: Secondary | ICD-10-CM | POA: Diagnosis not present

## 2014-03-14 DIAGNOSIS — M6281 Muscle weakness (generalized): Secondary | ICD-10-CM | POA: Diagnosis not present

## 2014-03-14 DIAGNOSIS — N179 Acute kidney failure, unspecified: Secondary | ICD-10-CM | POA: Diagnosis not present

## 2014-03-14 DIAGNOSIS — I75012 Atheroembolism of left upper extremity: Secondary | ICD-10-CM | POA: Diagnosis present

## 2014-03-14 DIAGNOSIS — Z9841 Cataract extraction status, right eye: Secondary | ICD-10-CM | POA: Diagnosis not present

## 2014-03-14 DIAGNOSIS — I10 Essential (primary) hypertension: Secondary | ICD-10-CM | POA: Diagnosis present

## 2014-03-14 DIAGNOSIS — Z85828 Personal history of other malignant neoplasm of skin: Secondary | ICD-10-CM | POA: Diagnosis not present

## 2014-03-14 DIAGNOSIS — I6789 Other cerebrovascular disease: Secondary | ICD-10-CM | POA: Diagnosis not present

## 2014-03-14 DIAGNOSIS — I482 Chronic atrial fibrillation: Secondary | ICD-10-CM | POA: Diagnosis present

## 2014-03-14 DIAGNOSIS — R6520 Severe sepsis without septic shock: Secondary | ICD-10-CM | POA: Diagnosis not present

## 2014-03-14 DIAGNOSIS — Z85038 Personal history of other malignant neoplasm of large intestine: Secondary | ICD-10-CM | POA: Diagnosis not present

## 2014-03-14 DIAGNOSIS — L0291 Cutaneous abscess, unspecified: Secondary | ICD-10-CM

## 2014-03-14 DIAGNOSIS — L03116 Cellulitis of left lower limb: Secondary | ICD-10-CM | POA: Diagnosis present

## 2014-03-14 DIAGNOSIS — L03119 Cellulitis of unspecified part of limb: Secondary | ICD-10-CM | POA: Diagnosis not present

## 2014-03-14 DIAGNOSIS — Z87891 Personal history of nicotine dependence: Secondary | ICD-10-CM | POA: Diagnosis not present

## 2014-03-14 DIAGNOSIS — I2510 Atherosclerotic heart disease of native coronary artery without angina pectoris: Secondary | ICD-10-CM | POA: Diagnosis present

## 2014-03-14 DIAGNOSIS — Z7901 Long term (current) use of anticoagulants: Secondary | ICD-10-CM | POA: Diagnosis not present

## 2014-03-14 DIAGNOSIS — Z961 Presence of intraocular lens: Secondary | ICD-10-CM | POA: Diagnosis present

## 2014-03-14 DIAGNOSIS — I472 Ventricular tachycardia: Secondary | ICD-10-CM | POA: Diagnosis not present

## 2014-03-14 DIAGNOSIS — E785 Hyperlipidemia, unspecified: Secondary | ICD-10-CM | POA: Diagnosis present

## 2014-03-14 DIAGNOSIS — A419 Sepsis, unspecified organism: Secondary | ICD-10-CM | POA: Diagnosis not present

## 2014-03-14 DIAGNOSIS — I4891 Unspecified atrial fibrillation: Secondary | ICD-10-CM | POA: Diagnosis not present

## 2014-03-14 DIAGNOSIS — I420 Dilated cardiomyopathy: Secondary | ICD-10-CM | POA: Diagnosis not present

## 2014-03-14 DIAGNOSIS — I509 Heart failure, unspecified: Secondary | ICD-10-CM | POA: Diagnosis present

## 2014-03-14 DIAGNOSIS — B999 Unspecified infectious disease: Secondary | ICD-10-CM | POA: Diagnosis not present

## 2014-03-14 DIAGNOSIS — I5043 Acute on chronic combined systolic (congestive) and diastolic (congestive) heart failure: Secondary | ICD-10-CM | POA: Diagnosis not present

## 2014-03-14 DIAGNOSIS — R5381 Other malaise: Secondary | ICD-10-CM | POA: Diagnosis not present

## 2014-03-14 DIAGNOSIS — R200 Anesthesia of skin: Secondary | ICD-10-CM | POA: Diagnosis not present

## 2014-03-14 DIAGNOSIS — N183 Chronic kidney disease, stage 3 (moderate): Secondary | ICD-10-CM | POA: Diagnosis not present

## 2014-03-14 DIAGNOSIS — Z952 Presence of prosthetic heart valve: Secondary | ICD-10-CM | POA: Diagnosis not present

## 2014-03-14 DIAGNOSIS — L03115 Cellulitis of right lower limb: Secondary | ICD-10-CM | POA: Diagnosis not present

## 2014-03-14 DIAGNOSIS — M1990 Unspecified osteoarthritis, unspecified site: Secondary | ICD-10-CM | POA: Diagnosis present

## 2014-03-14 DIAGNOSIS — R278 Other lack of coordination: Secondary | ICD-10-CM | POA: Diagnosis not present

## 2014-03-14 DIAGNOSIS — R531 Weakness: Secondary | ICD-10-CM | POA: Diagnosis not present

## 2014-03-14 DIAGNOSIS — Z8673 Personal history of transient ischemic attack (TIA), and cerebral infarction without residual deficits: Secondary | ICD-10-CM | POA: Diagnosis not present

## 2014-03-14 DIAGNOSIS — K625 Hemorrhage of anus and rectum: Secondary | ICD-10-CM | POA: Diagnosis not present

## 2014-03-14 DIAGNOSIS — I340 Nonrheumatic mitral (valve) insufficiency: Secondary | ICD-10-CM | POA: Diagnosis present

## 2014-03-14 DIAGNOSIS — I5022 Chronic systolic (congestive) heart failure: Secondary | ICD-10-CM | POA: Diagnosis not present

## 2014-03-14 DIAGNOSIS — L0390 Cellulitis, unspecified: Secondary | ICD-10-CM | POA: Diagnosis not present

## 2014-03-14 DIAGNOSIS — Z951 Presence of aortocoronary bypass graft: Secondary | ICD-10-CM | POA: Diagnosis not present

## 2014-03-14 DIAGNOSIS — M7981 Nontraumatic hematoma of soft tissue: Secondary | ICD-10-CM | POA: Diagnosis not present

## 2014-03-14 DIAGNOSIS — Z9842 Cataract extraction status, left eye: Secondary | ICD-10-CM | POA: Diagnosis not present

## 2014-03-14 LAB — BASIC METABOLIC PANEL
Anion gap: 9 (ref 5–15)
BUN: 44 mg/dL — AB (ref 6–23)
CO2: 30 mmol/L (ref 19–32)
CREATININE: 1.61 mg/dL — AB (ref 0.50–1.35)
Calcium: 8.9 mg/dL (ref 8.4–10.5)
Chloride: 101 mEq/L (ref 96–112)
GFR, EST AFRICAN AMERICAN: 42 mL/min — AB (ref >90)
GFR, EST NON AFRICAN AMERICAN: 37 mL/min — AB (ref >90)
Glucose, Bld: 107 mg/dL — ABNORMAL HIGH (ref 70–99)
POTASSIUM: 3.9 mmol/L (ref 3.5–5.1)
SODIUM: 140 mmol/L (ref 135–145)

## 2014-03-14 LAB — PROTIME-INR
INR: 2.99 — AB (ref 0.00–1.49)
PROTHROMBIN TIME: 31.3 s — AB (ref 11.6–15.2)

## 2014-03-14 LAB — MAGNESIUM: Magnesium: 2 mg/dL (ref 1.5–2.5)

## 2014-03-14 LAB — CBC
HCT: 35.1 % — ABNORMAL LOW (ref 39.0–52.0)
Hemoglobin: 11.8 g/dL — ABNORMAL LOW (ref 13.0–17.0)
MCH: 30.4 pg (ref 26.0–34.0)
MCHC: 33.6 g/dL (ref 30.0–36.0)
MCV: 90.5 fL (ref 78.0–100.0)
PLATELETS: 174 10*3/uL (ref 150–400)
RBC: 3.88 MIL/uL — ABNORMAL LOW (ref 4.22–5.81)
RDW: 14.7 % (ref 11.5–15.5)
WBC: 6.1 10*3/uL (ref 4.0–10.5)

## 2014-03-14 MED ORDER — POLYETHYLENE GLYCOL 3350 17 G PO PACK
17.0000 g | PACK | Freq: Every day | ORAL | Status: DC | PRN
  Administered 2014-03-14: 17 g via ORAL
  Filled 2014-03-14 (×2): qty 1

## 2014-03-14 MED ORDER — POLYETHYLENE GLYCOL 3350 17 G PO PACK: 17.0000 g | PACK | Freq: Every day | ORAL | Status: DC | PRN

## 2014-03-14 MED ORDER — ACETAMINOPHEN 325 MG PO TABS: 650.0000 mg | ORAL_TABLET | Freq: Four times a day (QID) | ORAL | Status: DC | PRN

## 2014-03-14 MED ORDER — FUROSEMIDE 40 MG PO TABS: 40.0000 mg | ORAL_TABLET | Freq: Two times a day (BID) | ORAL | Status: DC

## 2014-03-14 MED ORDER — ONDANSETRON HCL 4 MG PO TABS: 4.0000 mg | ORAL_TABLET | Freq: Four times a day (QID) | ORAL | Status: DC | PRN

## 2014-03-14 MED ORDER — SORBITOL 70 % SOLN
960.0000 mL | TOPICAL_OIL | Freq: Once | ORAL | Status: AC
  Administered 2014-03-14: 960 mL via RECTAL
  Filled 2014-03-14: qty 240

## 2014-03-14 MED ORDER — DOXYCYCLINE HYCLATE 100 MG PO TABS: 100.0000 mg | ORAL_TABLET | Freq: Two times a day (BID) | ORAL | Status: AC

## 2014-03-14 MED ORDER — MUPIROCIN CALCIUM 2 % EX CREA: TOPICAL_CREAM | Freq: Every day | CUTANEOUS | Status: DC

## 2014-03-14 NOTE — Progress Notes (Signed)
Patient alert oriented, no c/o pain , or shortness. V/S stable. D/c patient to snf via ambulance. Family notified.

## 2014-03-14 NOTE — Progress Notes (Signed)
ANTICOAGULATION CONSULT NOTE - Follow Up Consult  Pharmacy Consult for Coumadin Indication: atrial fibrillation and mechanical heart valve  No Known Allergies  Patient Measurements: Height: 5\' 7"  (170.2 cm) Weight: 140 lb 6.4 oz (63.685 kg) (c scale) IBW/kg (Calculated) : 66.1  Vital Signs: BP: 124/65 mmHg (01/06 0547) Pulse Rate: 69 (01/06 0547)  Labs:  Recent Labs  03/12/14 0438 03/13/14 0323 03/14/14 0315  HGB  --  13.0 11.8*  HCT  --  38.7* 35.1*  PLT  --  160 174  LABPROT 27.0* 29.9* 31.3*  INR 2.47* 2.82* 2.99*  CREATININE 1.25 1.13 1.61*    Estimated Creatinine Clearance: 28.6 mL/min (by C-G formula based on Cr of 1.61).   Medications:  Scheduled:  . antiseptic oral rinse  7 mL Mouth Rinse BID  . doxycycline  100 mg Oral Q12H  . furosemide  40 mg Oral BID  . hydrocerin   Topical Daily  . metoprolol succinate  100 mg Oral BID  . mupirocin cream   Topical Daily  . simvastatin  10 mg Oral QHS  . sodium chloride  3 mL Intravenous Q12H  . sorbitol, milk of mag, mineral oil, glycerin (SMOG) enema  960 mL Rectal Once  . warfarin  2.5 mg Oral Once per day on Mon Tue Wed Fri Sat  . warfarin  5 mg Oral Once per day on Sun Thu  . Warfarin - Pharmacist Dosing Inpatient   Does not apply q1800    Assessment: 81yo male on Coumadin for AFib and mechanical valve. INR 2.99 this AM on his home regimen. H/H down slightly, platelets are WNL. No bleeding noted. Doxycycline added yesterday which may increase the INR.   Goal of Therapy:  INR 2.5-3.5   Plan:  1. Continue home regimen of coumadin for now 2. F/u AM INR if pt not discharged 3. Wound recommend close follow-up of INR d/t new interacting medication  Salome Arnt, PharmD, BCPS Pager # [phone] 03/14/2014 1:41 PM

## 2014-03-14 NOTE — Progress Notes (Signed)
Report called to [facility]. Report given to [facility]. SBAR was used to give report. Opportunity to ask questions was given. Patient is being transported by ambulance. Pick up scheduled for 1430

## 2014-03-14 NOTE — Discharge Summary (Signed)
Discharge Summary  DENSIL OTTEY MR#: [id number]  DOB:1923-11-24  Date of Admission: 03/09/2014 Date of Discharge: 03/14/2014  Patient's PCP: Osborne Casco, MD  Attending Physician:Geralynn Capri A  Consults: Cardiology  Discharge Diagnoses: Principal Problem:   Sepsis due to cellulitis Active Problems:   Chronic a-fib   HTN (hypertension)   S/P MVR (mitral valve replacement)-[employer] 2002   GIB (gastrointestinal bleeding)   S/P CABG x 3 2002   Ischemic dilated cardiomyopathy-EF 25-30% 4008   Chronic systolic heart failure   Chronic renal disease, stage III   Acute on chronic systolic and diastolic heart failure, NYHA class 2   Cellulitis of right lower extremity   NSVT (nonsustained ventricular tachycardia)  Brief Admitting History and Physical On admission: "Alfred Dennis is a 81 y.o. male with Past medical history of hypertension, chronic systolic heart failure, chronic A. fib, coronary artery disease status post CABG, mitral wall replacement with mechanical fall, basal cell carcinoma. The patient presented with generalized weakness and a near fall. He mentions that he was working in the kitchen and started to feeling weak and slid down the floor. He did not hit his head did not have any loss of consciousness. He denies any chest pain or shortness of breath. Denies any nausea or vomiting. Denies any diarrhea or constipation or burning urination. He has chronic leg swellings and has been taking Lasix and started noticing some redness for last few days. He lives with his wife who has dementia and he is the primary caretaker for his wife. He has seen podiatry in the past but has not seen them for last many years. He walks without any support at home. He denies any pain in his hips or knee.   The patient is coming from home. And at his baseline independent for most of his ADL."  Discharge Medications   Medication List    TAKE these medications         acetaminophen 325 MG tablet  Commonly known as:  TYLENOL  Take 2 tablets (650 mg total) by mouth every 6 (six) hours as needed for mild pain (or Fever >/= 101).     doxycycline 100 MG tablet  Commonly known as:  VIBRA-TABS  Take 1 tablet (100 mg total) by mouth every 12 (twelve) hours.     furosemide 40 MG tablet  Commonly known as:  LASIX  Take 1 tablet (40 mg total) by mouth 2 (two) times daily.     hydrALAZINE 25 MG tablet  Commonly known as:  APRESOLINE  Take 25 mg by mouth 3 (three) times daily.     metoprolol 50 MG tablet  Commonly known as:  LOPRESSOR  Take 100 mg by mouth 2 (two) times daily.     multivitamin with minerals Tabs tablet  Take 1 tablet by mouth daily.     mupirocin cream 2 %  Commonly known as:  BACTROBAN  Apply topically daily.     omega-3 acid ethyl esters 1 G capsule  Commonly known as:  LOVAZA  Take 1 g by mouth 3 (three) times daily.     ondansetron 4 MG tablet  Commonly known as:  ZOFRAN  Take 1 tablet (4 mg total) by mouth every 6 (six) hours as needed for nausea.     polyethylene glycol packet  Commonly known as:  MIRALAX / GLYCOLAX  Take 17 g by mouth daily as needed for mild constipation.     simvastatin 10 MG tablet  Commonly known as:  ZOCOR  Take 10 mg by mouth at bedtime.     warfarin 5 MG tablet  Commonly known as:  COUMADIN  Take 2.5-5 mg by mouth daily. Take whole pill on sundays and thursdays and half pill rest of days     warfarin 5 MG tablet  Commonly known as:  COUMADIN  Take as directed by Coumadin Clinic        Hospital Course: Sepsis due to cellulitis Present on Admission:  . Sepsis due to cellulitis . Chronic a-fib . Chronic systolic heart failure . Chronic renal disease, stage III . HTN (hypertension) . Acute on chronic systolic and diastolic heart failure, NYHA class 2 . Cellulitis of right lower extremity . GIB (gastrointestinal bleeding) . Ischemic dilated cardiomyopathy-EF 25-30% 2013   Sepsis due  to cellulitis - Initially started on IV vancomycin on 1.1.2016, which was transitioned to doxycycline prior to discharge.  Patient has had significant improvement in redness and swelling during the hospital stay.   -blood cultures showed no growth to date, Patient remained afebrile, leukocytosis improved. - Wound care recommended "eucerin cream to assist with removal of dry cracked scaly skin. Bactroban to promote moist healing to open wounds. Recommend pt follow-up with the outpatient wound care clinic at the [facility] for continued assessment and callous trimming."  Acute on Chronic systolic heart failure: - Appreciate cardiology input, edema significantly improved. Patient transition from IV lasix ggt to oral lasix 40 mg po BID. - Cr has improved. - Restrict fluids and sodium. - 2-d echo EF 25-30%, diffuse hypokinesis. - Patient to have labs (electrolytes) checked on 03/16/2014 and results sent to Dr. Theodosia Blender office.  Chronic A. fib, mitral/S/P MVR (mitral valve replacement): - Continue warfarin. - Due to near fall, CT head on 1.2.2016 showed no IC bleed. - Patient to have PT/INR checked on 03/16/2014 and have results sent to Dr. Theodosia Blender office.  Nonsustained ventricular tachycardia - Electrolytes adequately replaced. - Cardiology input appreciated. Patient was deemed not a candidate for ICD given his age.  Acute kidney injury: - Resolved with diuresis. - Avoid nephrotoxic medications. - Patient to have labs (electrolytes including renal function) checked on 03/16/2014 and results sent to Dr. Theodosia Blender office.  Hypokalemia - Resolved with replacement.   Code Status: DNR  Consultants:  Cardiology  Procedures: ECHO on 03/12/2014: EF 25-30%, diffuse hypokinesis. CT head: No acute intracranial abnormalities. Chronic atrophy and small vessel ischemic changes.  Antibiotics:  Vanc 1.1.2016 >> 03/13/2014  Doxycycline 03/13/2014 >> (until 03/12/2014)  Day of Discharge BP 124/65 mmHg   Pulse 69  Temp(Src) 98.2 F (36.8 C) (Oral)  Resp 18  Ht $R'5\' 7"'Ly$  (1.702 m)  Wt 63.685 kg (140 lb 6.4 oz)  BMI 21.98 kg/m2  SpO2 98%  Results for orders placed or performed during the hospital encounter of 03/09/14 (from the past 48 hour(s))  Basic metabolic panel     Status: Abnormal   Collection Time: 03/13/14  3:23 AM  Result Value Ref Range   Sodium 137 135 - 145 mmol/L    Comment: Please note change in reference range.   Potassium 4.5 3.5 - 5.1 mmol/L    Comment: Please note change in reference range. DELTA CHECK NOTED HEMOLYSIS AT THIS LEVEL MAY AFFECT RESULT    Chloride 98 96 - 112 mEq/L   CO2 28 19 - 32 mmol/L   Glucose, Bld 97 70 - 99 mg/dL   BUN 38 (H) 6 - 23 mg/dL    Comment: DELTA CHECK NOTED  Creatinine, Ser 1.13 0.50 - 1.35 mg/dL   Calcium 9.0 8.4 - 10.5 mg/dL   GFR calc non Af Amer 56 (L) >90 mL/min   GFR calc Af Amer 65 (L) >90 mL/min    Comment: (NOTE) The eGFR has been calculated using the CKD EPI equation. This calculation has not been validated in all clinical situations. eGFR's persistently <90 mL/min signify possible Chronic Kidney Disease.    Anion gap 11 5 - 15  Protime-INR     Status: Abnormal   Collection Time: 03/13/14  3:23 AM  Result Value Ref Range   Prothrombin Time 29.9 (H) 11.6 - 15.2 seconds   INR 2.82 (H) 0.00 - 1.49  CBC     Status: Abnormal   Collection Time: 03/13/14  3:23 AM  Result Value Ref Range   WBC 6.3 4.0 - 10.5 K/uL   RBC 4.28 4.22 - 5.81 MIL/uL   Hemoglobin 13.0 13.0 - 17.0 g/dL   HCT 38.7 (L) 39.0 - 52.0 %   MCV 90.4 78.0 - 100.0 fL   MCH 30.4 26.0 - 34.0 pg   MCHC 33.6 30.0 - 36.0 g/dL   RDW 14.7 11.5 - 15.5 %   Platelets 160 150 - 400 K/uL  Magnesium     Status: None   Collection Time: 03/13/14  3:23 AM  Result Value Ref Range   Magnesium 2.1 1.5 - 2.5 mg/dL  CBC     Status: Abnormal   Collection Time: 03/14/14  3:15 AM  Result Value Ref Range   WBC 6.1 4.0 - 10.5 K/uL   RBC 3.88 (L) 4.22 - 5.81 MIL/uL    Hemoglobin 11.8 (L) 13.0 - 17.0 g/dL   HCT 35.1 (L) 39.0 - 52.0 %   MCV 90.5 78.0 - 100.0 fL   MCH 30.4 26.0 - 34.0 pg   MCHC 33.6 30.0 - 36.0 g/dL   RDW 14.7 11.5 - 15.5 %   Platelets 174 150 - 400 K/uL  Basic metabolic panel     Status: Abnormal   Collection Time: 03/14/14  3:15 AM  Result Value Ref Range   Sodium 140 135 - 145 mmol/L    Comment: Please note change in reference range.   Potassium 3.9 3.5 - 5.1 mmol/L    Comment: Please note change in reference range.   Chloride 101 96 - 112 mEq/L   CO2 30 19 - 32 mmol/L   Glucose, Bld 107 (H) 70 - 99 mg/dL   BUN 44 (H) 6 - 23 mg/dL   Creatinine, Ser 1.61 (H) 0.50 - 1.35 mg/dL   Calcium 8.9 8.4 - 10.5 mg/dL   GFR calc non Af Amer 37 (L) >90 mL/min   GFR calc Af Amer 42 (L) >90 mL/min    Comment: (NOTE) The eGFR has been calculated using the CKD EPI equation. This calculation has not been validated in all clinical situations. eGFR's persistently <90 mL/min signify possible Chronic Kidney Disease.    Anion gap 9 5 - 15  Magnesium     Status: None   Collection Time: 03/14/14  3:15 AM  Result Value Ref Range   Magnesium 2.0 1.5 - 2.5 mg/dL  Protime-INR     Status: Abnormal   Collection Time: 03/14/14  3:15 AM  Result Value Ref Range   Prothrombin Time 31.3 (H) 11.6 - 15.2 seconds   INR 2.99 (H) 0.00 - 1.49    Ct Head Wo Contrast  03/10/2014   CLINICAL DATA:  Patient lost balance wall  walking in slipped down to the floor. Chronic anticoagulation.  EXAM: CT HEAD WITHOUT CONTRAST  TECHNIQUE: Contiguous axial images were obtained from the base of the skull through the vertex without intravenous contrast.  COMPARISON:  None.  FINDINGS: Diffuse cerebral atrophy. Ventricular dilatation consistent with central atrophy. Low-attenuation changes in the deep white matter consistent with small vessel ischemia. Small focal area of encephalomalacia in the left occipital region consistent with old infarct. No mass effect or midline shift. No  abnormal extra-axial fluid collections. Gray-white matter junctions are distinct. Basal cisterns are not effaced. No evidence of acute intracranial hemorrhage. No depressed skull fractures. Visualized paranasal sinuses and mastoid air cells are not opacified.  IMPRESSION: No acute intracranial abnormalities. Chronic atrophy and small vessel ischemic changes. 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   Electronically Signed   By: Lucienne Capers M.D.   On: 03/10/2014 01:07   Dg Chest Port 1 View  03/09/2014   CLINICAL DATA:  Acute onset of generalized weakness for 1 day. Initial encounter.  EXAM: PORTABLE CHEST - 1 VIEW  COMPARISON:  Chest radiograph performed 02/12/2012  FINDINGS: Vascular congestion is noted. Minimal bilateral opacities could reflect minimal interstitial edema or possibly a mild infectious process. There is elevation of the left hemidiaphragm. No pleural effusion or pneumothorax is seen.  The cardiomediastinal silhouette remains significantly enlarged. The patient is status post median sternotomy, with evidence of prior CABG. A valve replacement is noted. No acute osseous abnormalities are identified.  IMPRESSION: Vascular congestion and cardiomegaly noted. Minimal bilateral opacities could reflect minimal interstitial edema or possibly a mild infectious process, depending on the patient's symptoms. Stable chronic elevation of the left hemidiaphragm.   Electronically Signed   By: Garald Balding M.D.   On: 03/09/2014 19:50   Disposition: SNF  Diet: Low-sodium diet  Activity: Resume as tolerated.   Follow-up Appts:     Discharge Instructions    Diet - low sodium heart healthy    Complete by:  As directed      Discharge instructions    Complete by:  As directed   Please follow-up with your Osborne Casco, MD (PCP) in 1 week. Please follow-up with Dr. Radford Pax (cardiology) 04/02/2014 at 130 p.m. as already arranged. Please have PT/INR and electrolytes ( potassium and renal  function) checked on 03/16/2014 and have results sent to Dr. Theodosia Blender office. Continue doxycycline until 03/22/2014 to complete a two-week course of antibiotics. Consider following up with the [place] wound clinic after discharge. If any worsening redness or infection in lower extremity please seek urgent medical care.     Increase activity slowly    Complete by:  As directed            TESTS THAT NEED FOLLOW-UP None  Time spent on discharge, talking to the patient, and coordinating care: 35 mins.   Signed: Bynum Bellows, MD 03/14/2014, 9:26 AM

## 2014-03-15 ENCOUNTER — Ambulatory Visit: Payer: Self-pay

## 2014-03-15 ENCOUNTER — Non-Acute Institutional Stay (SKILLED_NURSING_FACILITY): Payer: Medicare Other | Admitting: Internal Medicine

## 2014-03-15 DIAGNOSIS — I10 Essential (primary) hypertension: Secondary | ICD-10-CM | POA: Diagnosis not present

## 2014-03-15 DIAGNOSIS — E876 Hypokalemia: Secondary | ICD-10-CM

## 2014-03-15 DIAGNOSIS — R5381 Other malaise: Secondary | ICD-10-CM

## 2014-03-15 DIAGNOSIS — L03119 Cellulitis of unspecified part of limb: Secondary | ICD-10-CM | POA: Diagnosis not present

## 2014-03-15 DIAGNOSIS — I482 Chronic atrial fibrillation, unspecified: Secondary | ICD-10-CM

## 2014-03-15 DIAGNOSIS — I5022 Chronic systolic (congestive) heart failure: Secondary | ICD-10-CM

## 2014-03-15 DIAGNOSIS — K625 Hemorrhage of anus and rectum: Secondary | ICD-10-CM

## 2014-03-15 DIAGNOSIS — E785 Hyperlipidemia, unspecified: Secondary | ICD-10-CM

## 2014-03-15 DIAGNOSIS — R791 Abnormal coagulation profile: Secondary | ICD-10-CM

## 2014-03-15 DIAGNOSIS — N179 Acute kidney failure, unspecified: Secondary | ICD-10-CM | POA: Diagnosis not present

## 2014-03-15 DIAGNOSIS — K5900 Constipation, unspecified: Secondary | ICD-10-CM

## 2014-03-15 NOTE — Progress Notes (Signed)
Patient ID: Alfred Dennis, male   DOB: [birth year], 81 y.o.   MRN: [id number]     [facility]   PCP: Osborne Casco, MD  Code Status: DNR  No Known Allergies  Chief Complaint  Patient presents with  . New Admit To SNF     HPI:  81 year old patient is here for short term rehabilitation post hospital admission from 03/09/14-03/14/14 with generalized weakness and near fall. He was treated for sepsis from cellulitis with iv vancomycin and later switched to doxycycline. His blood cultures were negative. He was diuresed. He also had NSVT. Cardiology was consulted, pt was deemed not a candidate for ICD. Due to generalized weakness, he was sent to [facility]. Goal is for him to return home with his wife. He has past medical history of hypertension, chronic systolic heart failure, chronic A. fib, coronary artery disease status post CABG, MVR He is seen in his room today. He was noted by staff to have rectal bleed this morning. His coumadin is on hold currently with his inr being supratherapeutic. He has generalized fatigue and weakness but denies dizziness or lightheadedness. Denies any other concerns  Review of Systems:  Constitutional: Negative for fever, chills, diaphoresis.  HENT: Negative for headache, congestion Respiratory: Negative for cough, shortness of breath and wheezing.   Cardiovascular: Negative for chest pain, palpitations Gastrointestinal: Negative for heartburn, nausea, vomiting, abdominal pain. Genitourinary: Negative for dysuria  Musculoskeletal: Negative for back pain, falls Skin: Negative for itching, rash.  Neurological: Negative for dizziness, tingling, focal weakness Psychiatric/Behavioral: Negative for depression   Past Medical History  Diagnosis Date  . Hypertension   . Shortness of breath     "w/any activity lately" (02/03/2012)  . CHF (congestive heart failure)     mild/note 02/03/2012  . Arthritis     "left foot;  fingers" (02/03/2012)  . ARF (acute renal failure)   . GIB (gastrointestinal bleeding)   . Chronic atrial fibrillation   . Non-sustained ventricular tachycardia   . Chronic anticoagulation   . Coronary artery disease 05/2000    s/p CABG with LIMA to LAD, SVG to IM, SVG to OM  . Mitral valvular regurgitation     s/p MVR with mechanical valve  . Ischemic dilated cardiomyopathy     EF 25-30% by echo 01/2012 at time of colon surgery  . Hyperlipidemia   . Stroke     after CABG  . Carcinoma of colon   . Basal cell carcinoma of shoulder 10/2010   Past Surgical History  Procedure Laterality Date  . Inguinal hernia repair      right  . Cataract extraction w/ intraocular lens  implant, bilateral  ~ 2011  . Valve replacement  05/2000    St. Jude 81mm mechanical valve  . Cardiac valve replacement  05/2000    St. Jude 50mm  . Esophagogastroduodenoscopy  02/08/2012    Procedure: ESOPHAGOGASTRODUODENOSCOPY (EGD);  Surgeon: Gatha Mayer, MD;  Location: [facility] ENDOSCOPY;  Service: Endoscopy;  Laterality: N/A;  . Colonoscopy  02/08/2012    Procedure: COLONOSCOPY;  Surgeon: Gatha Mayer, MD;  Location: [place];  Service: Endoscopy;  Laterality: N/A;  . Laparoscopic partial colectomy  02/10/2012    Procedure: LAPAROSCOPIC PARTIAL COLECTOMY;  Surgeon: Stark Klein, MD;  Location: [place];  Service: General;  Laterality: N/A;  . Coronary artery bypass graft      LIMA to LAD, SVG to IM, SVG to OM2   Social History:  reports that he quit smoking about 34 years ago. His smoking use included Cigarettes. He has a 20 pack-year smoking history. He has never used smokeless tobacco. He reports that he does not drink alcohol or use illicit drugs.  Family History  Problem Relation Age of Onset  . Heart disease Mother     Medications: Patient's Medications  New Prescriptions   No medications on file  Previous Medications   ACETAMINOPHEN (TYLENOL) 325 MG TABLET    Take 2 tablets (650 mg total) by mouth  every 6 (six) hours as needed for mild pain (or Fever >/= 101).   DOXYCYCLINE (VIBRA-TABS) 100 MG TABLET    Take 1 tablet (100 mg total) by mouth every 12 (twelve) hours.   FUROSEMIDE (LASIX) 40 MG TABLET    Take 1 tablet (40 mg total) by mouth 2 (two) times daily.   HYDRALAZINE (APRESOLINE) 25 MG TABLET    Take 25 mg by mouth 3 (three) times daily.    METOPROLOL (LOPRESSOR) 50 MG TABLET    Take 100 mg by mouth 2 (two) times daily.   MULTIPLE VITAMIN (MULTIVITAMIN WITH MINERALS) TABS    Take 1 tablet by mouth daily.   MUPIROCIN CREAM (BACTROBAN) 2 %    Apply topically daily.   OMEGA-3 ACID ETHYL ESTERS (LOVAZA) 1 G CAPSULE    Take 1 g by mouth 3 (three) times daily.   ONDANSETRON (ZOFRAN) 4 MG TABLET    Take 1 tablet (4 mg total) by mouth every 6 (six) hours as needed for nausea.   POLYETHYLENE GLYCOL (MIRALAX / GLYCOLAX) PACKET    Take 17 g by mouth daily as needed for mild constipation.   SIMVASTATIN (ZOCOR) 10 MG TABLET    Take 10 mg by mouth at bedtime.   WARFARIN (COUMADIN) 5 MG TABLET    Take as directed by [facility]   WARFARIN (COUMADIN) 5 MG TABLET    Take 2.5-5 mg by mouth daily. Take whole pill on sundays and thursdays and half pill rest of days  Modified Medications   No medications on file  Discontinued Medications   No medications on file     Physical Exam: Filed Vitals:   03/15/14 1501  BP: 136/74  Pulse: 68  Temp: 97.1 F (36.2 C)  Resp: 18  SpO2: 96%    General- elderly male, thin built, in no acute distress Head- normocephalic, atraumatic Throat- moist mucus membrane Eyes- PERRLA, EOMI, no pallor, no icterus, no discharge, normal conjunctiva, normal sclera Neck- no cervical lymphadenopathy Cardiovascular- normal s1,s2, no murmurs, weak dorsalis pedis and good radial pulses, 1+ leg edema Respiratory- bilateral clear to auscultation, no wheeze, no rhonchi, no crackles, no use of accessory muscles Abdomen- bowel sounds present, soft, non  tender Musculoskeletal- able to move all 4 extremities, generalized weakness Neurological- no focal deficit Skin- warm and dry, thickened scaly skin on feet, callus present, erythema in feet noted, normal temperature Psychiatry- alert and oriented     Labs reviewed: Basic Metabolic Panel:  Recent Labs  03/11/14 0600 03/12/14 0438 03/13/14 0323 03/14/14 0315  NA  --  137 137 140  K  --  2.9* 4.5 3.9  CL  --  96 98 101  CO2  --  33* 28 30  GLUCOSE  --  110* 97 107*  BUN  --  25* 38* 44*  CREATININE  --  1.25 1.13 1.61*  CALCIUM  --  9.1 9.0 8.9  MG 1.9  --  2.1 2.0  Liver Function Tests:  Recent Labs  09/11/13 0826 03/09/14 1945 03/10/14 0328  AST  --  111* 82*  ALT 31 56* 52  ALKPHOS  --  92 79  BILITOT  --  1.1 1.5*  PROT  --  6.8 5.8*  ALBUMIN  --  3.8 3.2*   No results for input(s): LIPASE, AMYLASE in the last 8760 hours. No results for input(s): AMMONIA in the last 8760 hours. CBC:  Recent Labs  07/07/13 1450  03/09/14 1945 03/10/14 0328 03/13/14 0323 03/14/14 0315  WBC 9.3  < > 14.4* 10.6* 6.3 6.1  NEUTROABS 6.3  --  13.0*  --   --   --   HGB 12.2*  < > 12.3* 11.0* 13.0 11.8*  HCT 38.0*  < > 37.7* 33.3* 38.7* 35.1*  MCV 91.5  < > 92.2 91.7 90.4 90.5  PLT 178  < > 145* 96* 160 174  < > = values in this interval not displayed. Cardiac Enzymes:  Recent Labs  03/09/14 1945  TROPONINI 0.05*   Imaging ECHO on 03/12/2014: EF 25-30%, diffuse hypokinesis. CT head: No acute intracranial abnormalities. Chronic atrophy and small vessel ischemic changes.   Assessment/Plan  Physical deconditioning Will have him work with physical therapy and occupational therapy team to help with gait training and muscle strengthening exercises.fall precautions. Skin care. Encourage to be out of bed.   supratherapeutic inr inr today 3.3. Hold coumadin and check inr tomorrow. Give vitamin k 2.5 mg x 1 today with his ongoing rectal bleed  Rectal bleed No external  hemorrhoids visualized. Get stat cbc to rule out active bleed. He has supratherapeutic inr which could contribute to the bleed. On review of prior notes and EGD has hx of ? Gastritis. Hold coumadin, give a dose of vitamin k and start pantoprazole 40 mg bid x 5 days, then 40 mg daily for now.  Check orthostatics once a day for next 3 days  Cellulitis Improving, continue and complete course of doxycycline 100 mg bid until 03/22/14  CHF Monitor daily weight, continue lasix 40 mg bid, hydralazine 25 mg tid and lopressor 100 mg bid  afib Rate controlled, continue lopressor 100 mg bid, on coumadin, currently to be held with his inr results  ARF Monitor bmp , lab check today  Hypokalemia Monitor bmp. If k < 3.5, consider adding kcl supplement with him on lasix  Constipation  Avoid straining, continue miralax for now  Hyperlipidemia Continue zocor 10 mg daily  Goals of care: short term rehabilitation  Labs/tests ordered: stat cbc, bmp today  Family/ staff Communication: reviewed care plan with patient and nursing supervisor    Blanchie Serve, MD  [facility] [phone] (Monday-Friday 8 am - 5 pm) [phone] (afterhours)

## 2014-03-16 LAB — CULTURE, BLOOD (ROUTINE X 2)
Culture: NO GROWTH
Culture: NO GROWTH

## 2014-03-20 ENCOUNTER — Encounter (HOSPITAL_COMMUNITY): Payer: Self-pay

## 2014-03-20 ENCOUNTER — Inpatient Hospital Stay (HOSPITAL_COMMUNITY)
Admission: EM | Admit: 2014-03-20 | Discharge: 2014-03-24 | DRG: 253 | Disposition: A | Discharge status: Skilled Nursing Facility | Payer: Medicare Other | Attending: Vascular Surgery | Admitting: Vascular Surgery

## 2014-03-20 DIAGNOSIS — E785 Hyperlipidemia, unspecified: Secondary | ICD-10-CM | POA: Diagnosis present

## 2014-03-20 DIAGNOSIS — I482 Chronic atrial fibrillation: Secondary | ICD-10-CM | POA: Diagnosis present

## 2014-03-20 DIAGNOSIS — I998 Other disorder of circulatory system: Secondary | ICD-10-CM | POA: Diagnosis present

## 2014-03-20 DIAGNOSIS — Z9842 Cataract extraction status, left eye: Secondary | ICD-10-CM | POA: Diagnosis not present

## 2014-03-20 DIAGNOSIS — I517 Cardiomegaly: Secondary | ICD-10-CM | POA: Diagnosis not present

## 2014-03-20 DIAGNOSIS — R278 Other lack of coordination: Secondary | ICD-10-CM | POA: Diagnosis not present

## 2014-03-20 DIAGNOSIS — I509 Heart failure, unspecified: Secondary | ICD-10-CM | POA: Diagnosis present

## 2014-03-20 DIAGNOSIS — R200 Anesthesia of skin: Secondary | ICD-10-CM | POA: Diagnosis not present

## 2014-03-20 DIAGNOSIS — Z85828 Personal history of other malignant neoplasm of skin: Secondary | ICD-10-CM | POA: Diagnosis not present

## 2014-03-20 DIAGNOSIS — M1990 Unspecified osteoarthritis, unspecified site: Secondary | ICD-10-CM | POA: Diagnosis present

## 2014-03-20 DIAGNOSIS — R202 Paresthesia of skin: Secondary | ICD-10-CM | POA: Diagnosis not present

## 2014-03-20 DIAGNOSIS — I10 Essential (primary) hypertension: Secondary | ICD-10-CM | POA: Diagnosis present

## 2014-03-20 DIAGNOSIS — Z8673 Personal history of transient ischemic attack (TIA), and cerebral infarction without residual deficits: Secondary | ICD-10-CM | POA: Diagnosis not present

## 2014-03-20 DIAGNOSIS — R531 Weakness: Secondary | ICD-10-CM | POA: Diagnosis not present

## 2014-03-20 DIAGNOSIS — I340 Nonrheumatic mitral (valve) insufficiency: Secondary | ICD-10-CM | POA: Diagnosis present

## 2014-03-20 DIAGNOSIS — Z48812 Encounter for surgical aftercare following surgery on the circulatory system: Secondary | ICD-10-CM | POA: Diagnosis not present

## 2014-03-20 DIAGNOSIS — Z85038 Personal history of other malignant neoplasm of large intestine: Secondary | ICD-10-CM | POA: Diagnosis not present

## 2014-03-20 DIAGNOSIS — I5043 Acute on chronic combined systolic (congestive) and diastolic (congestive) heart failure: Secondary | ICD-10-CM | POA: Diagnosis not present

## 2014-03-20 DIAGNOSIS — M79602 Pain in left arm: Secondary | ICD-10-CM | POA: Diagnosis not present

## 2014-03-20 DIAGNOSIS — Z961 Presence of intraocular lens: Secondary | ICD-10-CM | POA: Diagnosis present

## 2014-03-20 DIAGNOSIS — Z87891 Personal history of nicotine dependence: Secondary | ICD-10-CM | POA: Diagnosis not present

## 2014-03-20 DIAGNOSIS — Z951 Presence of aortocoronary bypass graft: Secondary | ICD-10-CM | POA: Diagnosis not present

## 2014-03-20 DIAGNOSIS — A419 Sepsis, unspecified organism: Secondary | ICD-10-CM | POA: Diagnosis not present

## 2014-03-20 DIAGNOSIS — Z952 Presence of prosthetic heart valve: Secondary | ICD-10-CM

## 2014-03-20 DIAGNOSIS — L03116 Cellulitis of left lower limb: Secondary | ICD-10-CM | POA: Diagnosis present

## 2014-03-20 DIAGNOSIS — I82622 Acute embolism and thrombosis of deep veins of left upper extremity: Secondary | ICD-10-CM | POA: Diagnosis not present

## 2014-03-20 DIAGNOSIS — I420 Dilated cardiomyopathy: Secondary | ICD-10-CM | POA: Diagnosis not present

## 2014-03-20 DIAGNOSIS — J9811 Atelectasis: Secondary | ICD-10-CM | POA: Diagnosis not present

## 2014-03-20 DIAGNOSIS — I2510 Atherosclerotic heart disease of native coronary artery without angina pectoris: Secondary | ICD-10-CM | POA: Diagnosis present

## 2014-03-20 DIAGNOSIS — I4891 Unspecified atrial fibrillation: Secondary | ICD-10-CM | POA: Diagnosis not present

## 2014-03-20 DIAGNOSIS — R29810 Facial weakness: Secondary | ICD-10-CM | POA: Diagnosis not present

## 2014-03-20 DIAGNOSIS — I75012 Atheroembolism of left upper extremity: Principal | ICD-10-CM | POA: Diagnosis present

## 2014-03-20 DIAGNOSIS — Z01818 Encounter for other preprocedural examination: Secondary | ICD-10-CM | POA: Diagnosis not present

## 2014-03-20 DIAGNOSIS — I742 Embolism and thrombosis of arteries of the upper extremities: Secondary | ICD-10-CM | POA: Diagnosis not present

## 2014-03-20 DIAGNOSIS — Z01811 Encounter for preprocedural respiratory examination: Secondary | ICD-10-CM

## 2014-03-20 DIAGNOSIS — N183 Chronic kidney disease, stage 3 (moderate): Secondary | ICD-10-CM | POA: Diagnosis not present

## 2014-03-20 DIAGNOSIS — L03115 Cellulitis of right lower limb: Secondary | ICD-10-CM | POA: Diagnosis not present

## 2014-03-20 DIAGNOSIS — M7981 Nontraumatic hematoma of soft tissue: Secondary | ICD-10-CM | POA: Diagnosis not present

## 2014-03-20 DIAGNOSIS — Z7901 Long term (current) use of anticoagulants: Secondary | ICD-10-CM

## 2014-03-20 DIAGNOSIS — M25669 Stiffness of unspecified knee, not elsewhere classified: Secondary | ICD-10-CM | POA: Diagnosis not present

## 2014-03-20 DIAGNOSIS — Z9841 Cataract extraction status, right eye: Secondary | ICD-10-CM

## 2014-03-20 DIAGNOSIS — R2681 Unsteadiness on feet: Secondary | ICD-10-CM | POA: Diagnosis not present

## 2014-03-20 LAB — CBC
HCT: 37 % — ABNORMAL LOW (ref 39.0–52.0)
Hemoglobin: 12.5 g/dL — ABNORMAL LOW (ref 13.0–17.0)
MCH: 30.1 pg (ref 26.0–34.0)
MCHC: 33.8 g/dL (ref 30.0–36.0)
MCV: 89.2 fL (ref 78.0–100.0)
Platelets: 202 10*3/uL (ref 150–400)
RBC: 4.15 MIL/uL — ABNORMAL LOW (ref 4.22–5.81)
RDW: 14.5 % (ref 11.5–15.5)
WBC: 6.5 10*3/uL (ref 4.0–10.5)

## 2014-03-20 LAB — BASIC METABOLIC PANEL
Anion gap: 11 (ref 5–15)
BUN: 41 mg/dL — ABNORMAL HIGH (ref 6–23)
CO2: 22 mmol/L (ref 19–32)
Calcium: 9.4 mg/dL (ref 8.4–10.5)
Chloride: 106 mEq/L (ref 96–112)
Creatinine, Ser: 1.63 mg/dL — ABNORMAL HIGH (ref 0.50–1.35)
GFR calc non Af Amer: 36 mL/min — ABNORMAL LOW (ref >90)
GFR, EST AFRICAN AMERICAN: 42 mL/min — AB (ref >90)
GLUCOSE: 109 mg/dL — AB (ref 70–99)
POTASSIUM: 4.1 mmol/L (ref 3.5–5.1)
SODIUM: 139 mmol/L (ref 135–145)

## 2014-03-20 LAB — PROTIME-INR
INR: 1.87 — AB (ref 0.00–1.49)
PROTHROMBIN TIME: 21.7 s — AB (ref 11.6–15.2)

## 2014-03-20 NOTE — ED Notes (Signed)
Pt comes from [place] [facility] re[facility] via [place], was doing PT today and started having left sided numbness in the arm from the elbow down, arm was cold to touch, feeling has now resolved.

## 2014-03-21 ENCOUNTER — Encounter (HOSPITAL_COMMUNITY)
Admission: EM | Disposition: A | Discharge status: Skilled Nursing Facility | Payer: Self-pay | Source: Home / Self Care | Attending: Vascular Surgery

## 2014-03-21 ENCOUNTER — Encounter (HOSPITAL_COMMUNITY): Payer: Self-pay | Admitting: Critical Care Medicine

## 2014-03-21 ENCOUNTER — Inpatient Hospital Stay (HOSPITAL_COMMUNITY): Payer: Medicare Other | Admitting: Critical Care Medicine

## 2014-03-21 ENCOUNTER — Inpatient Hospital Stay (HOSPITAL_COMMUNITY): Payer: Medicare Other

## 2014-03-21 DIAGNOSIS — Z7901 Long term (current) use of anticoagulants: Secondary | ICD-10-CM | POA: Diagnosis not present

## 2014-03-21 DIAGNOSIS — Z85038 Personal history of other malignant neoplasm of large intestine: Secondary | ICD-10-CM | POA: Diagnosis not present

## 2014-03-21 DIAGNOSIS — I340 Nonrheumatic mitral (valve) insufficiency: Secondary | ICD-10-CM | POA: Diagnosis present

## 2014-03-21 DIAGNOSIS — R202 Paresthesia of skin: Secondary | ICD-10-CM | POA: Diagnosis not present

## 2014-03-21 DIAGNOSIS — Z01818 Encounter for other preprocedural examination: Secondary | ICD-10-CM | POA: Diagnosis not present

## 2014-03-21 DIAGNOSIS — J9811 Atelectasis: Secondary | ICD-10-CM | POA: Diagnosis not present

## 2014-03-21 DIAGNOSIS — E785 Hyperlipidemia, unspecified: Secondary | ICD-10-CM | POA: Diagnosis present

## 2014-03-21 DIAGNOSIS — I998 Other disorder of circulatory system: Secondary | ICD-10-CM | POA: Diagnosis present

## 2014-03-21 DIAGNOSIS — N183 Chronic kidney disease, stage 3 (moderate): Secondary | ICD-10-CM | POA: Diagnosis not present

## 2014-03-21 DIAGNOSIS — M1990 Unspecified osteoarthritis, unspecified site: Secondary | ICD-10-CM | POA: Diagnosis not present

## 2014-03-21 DIAGNOSIS — Z961 Presence of intraocular lens: Secondary | ICD-10-CM | POA: Diagnosis present

## 2014-03-21 DIAGNOSIS — L03115 Cellulitis of right lower limb: Secondary | ICD-10-CM | POA: Diagnosis not present

## 2014-03-21 DIAGNOSIS — Z952 Presence of prosthetic heart valve: Secondary | ICD-10-CM | POA: Diagnosis not present

## 2014-03-21 DIAGNOSIS — I5043 Acute on chronic combined systolic (congestive) and diastolic (congestive) heart failure: Secondary | ICD-10-CM | POA: Diagnosis not present

## 2014-03-21 DIAGNOSIS — I82622 Acute embolism and thrombosis of deep veins of left upper extremity: Secondary | ICD-10-CM | POA: Diagnosis not present

## 2014-03-21 DIAGNOSIS — I509 Heart failure, unspecified: Secondary | ICD-10-CM | POA: Diagnosis not present

## 2014-03-21 DIAGNOSIS — R2681 Unsteadiness on feet: Secondary | ICD-10-CM | POA: Diagnosis not present

## 2014-03-21 DIAGNOSIS — M25669 Stiffness of unspecified knee, not elsewhere classified: Secondary | ICD-10-CM | POA: Diagnosis not present

## 2014-03-21 DIAGNOSIS — Z9841 Cataract extraction status, right eye: Secondary | ICD-10-CM | POA: Diagnosis not present

## 2014-03-21 DIAGNOSIS — I2510 Atherosclerotic heart disease of native coronary artery without angina pectoris: Secondary | ICD-10-CM | POA: Diagnosis present

## 2014-03-21 DIAGNOSIS — I517 Cardiomegaly: Secondary | ICD-10-CM | POA: Diagnosis not present

## 2014-03-21 DIAGNOSIS — Z9842 Cataract extraction status, left eye: Secondary | ICD-10-CM | POA: Diagnosis not present

## 2014-03-21 DIAGNOSIS — I482 Chronic atrial fibrillation: Secondary | ICD-10-CM | POA: Diagnosis present

## 2014-03-21 DIAGNOSIS — Z48812 Encounter for surgical aftercare following surgery on the circulatory system: Secondary | ICD-10-CM | POA: Diagnosis not present

## 2014-03-21 DIAGNOSIS — I75012 Atheroembolism of left upper extremity: Secondary | ICD-10-CM | POA: Diagnosis not present

## 2014-03-21 DIAGNOSIS — I420 Dilated cardiomyopathy: Secondary | ICD-10-CM | POA: Diagnosis not present

## 2014-03-21 DIAGNOSIS — I10 Essential (primary) hypertension: Secondary | ICD-10-CM | POA: Diagnosis not present

## 2014-03-21 DIAGNOSIS — Z85828 Personal history of other malignant neoplasm of skin: Secondary | ICD-10-CM | POA: Diagnosis not present

## 2014-03-21 DIAGNOSIS — A419 Sepsis, unspecified organism: Secondary | ICD-10-CM | POA: Diagnosis not present

## 2014-03-21 DIAGNOSIS — I742 Embolism and thrombosis of arteries of the upper extremities: Secondary | ICD-10-CM

## 2014-03-21 DIAGNOSIS — R200 Anesthesia of skin: Secondary | ICD-10-CM | POA: Diagnosis present

## 2014-03-21 DIAGNOSIS — Z8673 Personal history of transient ischemic attack (TIA), and cerebral infarction without residual deficits: Secondary | ICD-10-CM | POA: Diagnosis not present

## 2014-03-21 DIAGNOSIS — M79602 Pain in left arm: Secondary | ICD-10-CM | POA: Diagnosis not present

## 2014-03-21 DIAGNOSIS — R278 Other lack of coordination: Secondary | ICD-10-CM | POA: Diagnosis not present

## 2014-03-21 DIAGNOSIS — R29810 Facial weakness: Secondary | ICD-10-CM | POA: Diagnosis not present

## 2014-03-21 DIAGNOSIS — L03116 Cellulitis of left lower limb: Secondary | ICD-10-CM | POA: Diagnosis not present

## 2014-03-21 DIAGNOSIS — Z951 Presence of aortocoronary bypass graft: Secondary | ICD-10-CM | POA: Diagnosis not present

## 2014-03-21 DIAGNOSIS — R531 Weakness: Secondary | ICD-10-CM | POA: Diagnosis not present

## 2014-03-21 DIAGNOSIS — Z87891 Personal history of nicotine dependence: Secondary | ICD-10-CM | POA: Diagnosis not present

## 2014-03-21 HISTORY — PX: EMBOLECTOMY: SHX44

## 2014-03-21 LAB — CBC
HCT: 35.9 % — ABNORMAL LOW (ref 39.0–52.0)
HEMOGLOBIN: 11.8 g/dL — AB (ref 13.0–17.0)
MCH: 29.3 pg (ref 26.0–34.0)
MCHC: 32.9 g/dL (ref 30.0–36.0)
MCV: 89.1 fL (ref 78.0–100.0)
Platelets: 203 10*3/uL (ref 150–400)
RBC: 4.03 MIL/uL — ABNORMAL LOW (ref 4.22–5.81)
RDW: 14.6 % (ref 11.5–15.5)
WBC: 7 10*3/uL (ref 4.0–10.5)

## 2014-03-21 LAB — BASIC METABOLIC PANEL
ANION GAP: 9 (ref 5–15)
BUN: 35 mg/dL — ABNORMAL HIGH (ref 6–23)
CHLORIDE: 105 meq/L (ref 96–112)
CO2: 23 mmol/L (ref 19–32)
Calcium: 8.9 mg/dL (ref 8.4–10.5)
Creatinine, Ser: 1.33 mg/dL (ref 0.50–1.35)
GFR calc non Af Amer: 46 mL/min — ABNORMAL LOW (ref >90)
GFR, EST AFRICAN AMERICAN: 53 mL/min — AB (ref >90)
Glucose, Bld: 96 mg/dL (ref 70–99)
Potassium: 3.6 mmol/L (ref 3.5–5.1)
SODIUM: 137 mmol/L (ref 135–145)

## 2014-03-21 LAB — PROTIME-INR
INR: 1.93 — ABNORMAL HIGH (ref 0.00–1.49)
PROTHROMBIN TIME: 22.2 s — AB (ref 11.6–15.2)

## 2014-03-21 LAB — HEPARIN LEVEL (UNFRACTIONATED)
HEPARIN UNFRACTIONATED: 0.33 [IU]/mL (ref 0.30–0.70)
Heparin Unfractionated: 0.52 IU/mL (ref 0.30–0.70)

## 2014-03-21 SURGERY — EMBOLECTOMY
Anesthesia: Monitor Anesthesia Care | Site: Arm Lower | Laterality: Left

## 2014-03-21 MED ORDER — 0.9 % SODIUM CHLORIDE (POUR BTL) OPTIME
TOPICAL | Status: DC | PRN
  Administered 2014-03-21: 1000 mL

## 2014-03-21 MED ORDER — EPHEDRINE SULFATE 50 MG/ML IJ SOLN
INTRAMUSCULAR | Status: DC | PRN
  Administered 2014-03-21 (×2): 5 mg via INTRAVENOUS

## 2014-03-21 MED ORDER — SODIUM CHLORIDE 0.9 % IR SOLN
Status: DC | PRN
  Administered 2014-03-21: 500 mL

## 2014-03-21 MED ORDER — HYDRALAZINE HCL 20 MG/ML IJ SOLN: 10.0000 mg | INTRAMUSCULAR | Status: DC | PRN

## 2014-03-21 MED ORDER — FUROSEMIDE 40 MG PO TABS
40.0000 mg | ORAL_TABLET | Freq: Two times a day (BID) | ORAL | Status: DC
  Administered 2014-03-21 – 2014-03-24 (×7): 40 mg via ORAL
  Filled 2014-03-21 (×10): qty 1

## 2014-03-21 MED ORDER — POLYETHYLENE GLYCOL 3350 17 G PO PACK
17.0000 g | PACK | Freq: Every day | ORAL | Status: DC | PRN
  Filled 2014-03-21: qty 1

## 2014-03-21 MED ORDER — CEFAZOLIN SODIUM-DEXTROSE 2-3 GM-% IV SOLR
INTRAVENOUS | Status: AC
  Filled 2014-03-21: qty 50

## 2014-03-21 MED ORDER — PANTOPRAZOLE SODIUM 40 MG PO TBEC
40.0000 mg | DELAYED_RELEASE_TABLET | Freq: Every day | ORAL | Status: DC
  Administered 2014-03-22 – 2014-03-24 (×3): 40 mg via ORAL
  Filled 2014-03-21 (×3): qty 1

## 2014-03-21 MED ORDER — WARFARIN SODIUM 5 MG PO TABS: 5.0000 mg | ORAL_TABLET | ORAL | Status: DC

## 2014-03-21 MED ORDER — PROPOFOL INFUSION 10 MG/ML OPTIME
INTRAVENOUS | Status: DC | PRN
  Administered 2014-03-21: 50 ug/kg/min via INTRAVENOUS

## 2014-03-21 MED ORDER — ONDANSETRON HCL 4 MG PO TABS: 4.0000 mg | ORAL_TABLET | Freq: Four times a day (QID) | ORAL | Status: DC | PRN

## 2014-03-21 MED ORDER — PANTOPRAZOLE SODIUM 40 MG PO TBEC
40.0000 mg | DELAYED_RELEASE_TABLET | Freq: Two times a day (BID) | ORAL | Status: DC
  Administered 2014-03-21: 40 mg via ORAL
  Filled 2014-03-21: qty 1

## 2014-03-21 MED ORDER — BISACODYL 10 MG RE SUPP: 10.0000 mg | Freq: Every day | RECTAL | Status: DC | PRN

## 2014-03-21 MED ORDER — WARFARIN - PHYSICIAN DOSING INPATIENT
Freq: Every day | Status: DC
  Administered 2014-03-22: 18:00:00

## 2014-03-21 MED ORDER — PHENOL 1.4 % MT LIQD
1.0000 | OROMUCOSAL | Status: DC | PRN
  Filled 2014-03-21: qty 177

## 2014-03-21 MED ORDER — OXYCODONE-ACETAMINOPHEN 5-325 MG PO TABS: 1.0000 | ORAL_TABLET | ORAL | Status: DC | PRN

## 2014-03-21 MED ORDER — THROMBIN 20000 UNITS EX SOLR
CUTANEOUS | Status: AC
  Filled 2014-03-21: qty 20000

## 2014-03-21 MED ORDER — ALUM & MAG HYDROXIDE-SIMETH 200-200-20 MG/5ML PO SUSP: 15.0000 mL | ORAL | Status: DC | PRN

## 2014-03-21 MED ORDER — OMEGA-3-ACID ETHYL ESTERS 1 G PO CAPS
1.0000 g | ORAL_CAPSULE | Freq: Three times a day (TID) | ORAL | Status: DC
  Administered 2014-03-21: 1 g via ORAL
  Filled 2014-03-21 (×3): qty 1

## 2014-03-21 MED ORDER — GUAIFENESIN-DM 100-10 MG/5ML PO SYRP: 15.0000 mL | ORAL_SOLUTION | ORAL | Status: DC | PRN

## 2014-03-21 MED ORDER — OMEGA-3-ACID ETHYL ESTERS 1 G PO CAPS
1.0000 g | ORAL_CAPSULE | Freq: Three times a day (TID) | ORAL | Status: DC
  Administered 2014-03-21 – 2014-03-24 (×8): 1 g via ORAL
  Filled 2014-03-21 (×11): qty 1

## 2014-03-21 MED ORDER — ONDANSETRON HCL 4 MG/2ML IJ SOLN
4.0000 mg | Freq: Four times a day (QID) | INTRAMUSCULAR | Status: DC | PRN
Start: 2014-03-21 — End: 2014-03-24

## 2014-03-21 MED ORDER — FENTANYL CITRATE 0.05 MG/ML IJ SOLN
INTRAMUSCULAR | Status: DC | PRN
  Administered 2014-03-21: 25 ug via INTRAVENOUS

## 2014-03-21 MED ORDER — DOXYCYCLINE HYCLATE 100 MG PO TABS
100.0000 mg | ORAL_TABLET | Freq: Two times a day (BID) | ORAL | Status: DC
  Administered 2014-03-21 – 2014-03-24 (×7): 100 mg via ORAL
  Filled 2014-03-21 (×9): qty 1

## 2014-03-21 MED ORDER — LACTATED RINGERS IV SOLN
INTRAVENOUS | Status: DC | PRN
  Administered 2014-03-21: 16:00:00 via INTRAVENOUS

## 2014-03-21 MED ORDER — ONDANSETRON HCL 4 MG/2ML IJ SOLN
INTRAMUSCULAR | Status: DC | PRN
  Administered 2014-03-21: 4 mg via INTRAVENOUS

## 2014-03-21 MED ORDER — MUPIROCIN CALCIUM 2 % EX CREA
TOPICAL_CREAM | Freq: Every day | CUTANEOUS | Status: DC
  Administered 2014-03-22 – 2014-03-24 (×3): via TOPICAL
  Filled 2014-03-21 (×2): qty 15

## 2014-03-21 MED ORDER — FENTANYL CITRATE 0.05 MG/ML IJ SOLN
INTRAMUSCULAR | Status: AC
  Filled 2014-03-21: qty 5

## 2014-03-21 MED ORDER — HEPARIN (PORCINE) IN NACL 100-0.45 UNIT/ML-% IJ SOLN
900.0000 [IU]/h | INTRAMUSCULAR | Status: DC
  Administered 2014-03-21 – 2014-03-22 (×2): 900 [IU]/h via INTRAVENOUS
  Filled 2014-03-21 (×3): qty 250

## 2014-03-21 MED ORDER — METOPROLOL TARTRATE 100 MG PO TABS
100.0000 mg | ORAL_TABLET | Freq: Two times a day (BID) | ORAL | Status: DC
  Administered 2014-03-21 – 2014-03-24 (×7): 100 mg via ORAL
  Filled 2014-03-21 (×8): qty 1

## 2014-03-21 MED ORDER — POTASSIUM CHLORIDE CRYS ER 20 MEQ PO TBCR
20.0000 meq | EXTENDED_RELEASE_TABLET | Freq: Once | ORAL | Status: AC
  Administered 2014-03-21: 20 meq via ORAL
  Filled 2014-03-21: qty 1

## 2014-03-21 MED ORDER — WARFARIN SODIUM 2.5 MG PO TABS
2.5000 mg | ORAL_TABLET | ORAL | Status: DC
  Administered 2014-03-23: 2.5 mg via ORAL
  Filled 2014-03-21 (×2): qty 1

## 2014-03-21 MED ORDER — LIDOCAINE HCL (PF) 1 % IJ SOLN
INTRAMUSCULAR | Status: AC
  Filled 2014-03-21: qty 30

## 2014-03-21 MED ORDER — DOCUSATE SODIUM 100 MG PO CAPS
100.0000 mg | ORAL_CAPSULE | Freq: Two times a day (BID) | ORAL | Status: DC
  Administered 2014-03-21 – 2014-03-24 (×6): 100 mg via ORAL
  Filled 2014-03-21 (×8): qty 1

## 2014-03-21 MED ORDER — SIMVASTATIN 10 MG PO TABS
10.0000 mg | ORAL_TABLET | Freq: Every day | ORAL | Status: DC
  Administered 2014-03-21 – 2014-03-23 (×3): 10 mg via ORAL
  Filled 2014-03-21 (×5): qty 1

## 2014-03-21 MED ORDER — METOPROLOL TARTRATE 100 MG PO TABS
100.0000 mg | ORAL_TABLET | Freq: Two times a day (BID) | ORAL | Status: DC
  Filled 2014-03-21: qty 1

## 2014-03-21 MED ORDER — LIDOCAINE-EPINEPHRINE (PF) 1 %-1:200000 IJ SOLN
INTRAMUSCULAR | Status: DC | PRN
  Administered 2014-03-21: 14 mL

## 2014-03-21 MED ORDER — ADULT MULTIVITAMIN W/MINERALS CH
1.0000 | ORAL_TABLET | Freq: Every day | ORAL | Status: DC
Start: 2014-03-21 — End: 2014-03-24
  Administered 2014-03-21 – 2014-03-24 (×4): 1 via ORAL
  Filled 2014-03-21 (×4): qty 1

## 2014-03-21 MED ORDER — HEPARIN (PORCINE) IN NACL 100-0.45 UNIT/ML-% IJ SOLN
900.0000 [IU]/h | INTRAMUSCULAR | Status: DC
  Administered 2014-03-21: 900 [IU]/h via INTRAVENOUS

## 2014-03-21 MED ORDER — FUROSEMIDE 40 MG PO TABS
40.0000 mg | ORAL_TABLET | Freq: Two times a day (BID) | ORAL | Status: DC
  Filled 2014-03-21 (×2): qty 1

## 2014-03-21 MED ORDER — HYDRALAZINE HCL 25 MG PO TABS
25.0000 mg | ORAL_TABLET | Freq: Three times a day (TID) | ORAL | Status: DC
  Administered 2014-03-21 – 2014-03-24 (×8): 25 mg via ORAL
  Filled 2014-03-21 (×12): qty 1

## 2014-03-21 MED ORDER — LABETALOL HCL 5 MG/ML IV SOLN
5.0000 mg | INTRAVENOUS | Status: DC | PRN
  Filled 2014-03-21: qty 4

## 2014-03-21 MED ORDER — ACETAMINOPHEN 325 MG PO TABS
650.0000 mg | ORAL_TABLET | Freq: Four times a day (QID) | ORAL | Status: DC | PRN
  Administered 2014-03-22 – 2014-03-24 (×7): 650 mg via ORAL
  Filled 2014-03-21 (×7): qty 2

## 2014-03-21 MED ORDER — SODIUM CHLORIDE 0.45 % IV SOLN
INTRAVENOUS | Status: DC
  Administered 2014-03-22: 16:00:00 via INTRAVENOUS

## 2014-03-21 MED ORDER — DOXYCYCLINE HYCLATE 100 MG PO TABS
100.0000 mg | ORAL_TABLET | Freq: Two times a day (BID) | ORAL | Status: DC
  Filled 2014-03-21: qty 1

## 2014-03-21 MED ORDER — WARFARIN SODIUM 5 MG PO TABS
5.0000 mg | ORAL_TABLET | ORAL | Status: DC
  Administered 2014-03-22: 5 mg via ORAL
  Filled 2014-03-21: qty 1

## 2014-03-21 MED ORDER — HEPARIN SODIUM (PORCINE) 1000 UNIT/ML IJ SOLN
INTRAMUSCULAR | Status: DC | PRN
  Administered 2014-03-21: 5000 [IU] via INTRAVENOUS

## 2014-03-21 MED ORDER — WARFARIN SODIUM 2.5 MG PO TABS: 2.5000 mg | ORAL_TABLET | ORAL | Status: DC

## 2014-03-21 MED ORDER — MORPHINE SULFATE 2 MG/ML IJ SOLN: 2.0000 mg | INTRAMUSCULAR | Status: DC | PRN

## 2014-03-21 MED ORDER — FENTANYL CITRATE 0.05 MG/ML IJ SOLN: 25.0000 ug | INTRAMUSCULAR | Status: DC | PRN

## 2014-03-21 MED ORDER — CEFAZOLIN SODIUM-DEXTROSE 2-3 GM-% IV SOLR
INTRAVENOUS | Status: DC | PRN
  Administered 2014-03-21: 2 g via INTRAVENOUS

## 2014-03-21 MED ORDER — ADULT MULTIVITAMIN W/MINERALS CH
1.0000 | ORAL_TABLET | Freq: Every day | ORAL | Status: DC
  Filled 2014-03-21: qty 1

## 2014-03-21 MED ORDER — LIDOCAINE-EPINEPHRINE (PF) 1 %-1:200000 IJ SOLN
INTRAMUSCULAR | Status: AC
  Filled 2014-03-21: qty 10

## 2014-03-21 SURGICAL SUPPLY — 57 items
BANDAGE ESMARK 6X9 LF (GAUZE/BANDAGES/DRESSINGS) IMPLANT
BNDG CMPR 9X6 STRL LF SNTH (GAUZE/BANDAGES/DRESSINGS)
BNDG ESMARK 6X9 LF (GAUZE/BANDAGES/DRESSINGS)
CANISTER SUCTION 2500CC (MISCELLANEOUS) ×2 IMPLANT
CATH EMB 3FR 80CM (CATHETERS) ×1 IMPLANT
CATH EMB 4FR 80CM (CATHETERS) IMPLANT
CATH EMB 5FR 80CM (CATHETERS) IMPLANT
CLIP TI MEDIUM 24 (CLIP) ×2 IMPLANT
CLIP TI WIDE RED SMALL 24 (CLIP) ×2 IMPLANT
CONT SPEC 4OZ CLIKSEAL STRL BL (MISCELLANEOUS) ×2 IMPLANT
COVER SURGICAL LIGHT HANDLE (MISCELLANEOUS) ×2 IMPLANT
CUFF TOURNIQUET SINGLE 24IN (TOURNIQUET CUFF) IMPLANT
CUFF TOURNIQUET SINGLE 34IN LL (TOURNIQUET CUFF) IMPLANT
CUFF TOURNIQUET SINGLE 44IN (TOURNIQUET CUFF) IMPLANT
DECANTER SPIKE VIAL GLASS SM (MISCELLANEOUS) IMPLANT
DRAIN SNY 10X20 3/4 PERF (WOUND CARE) IMPLANT
DRAPE X-RAY CASS 24X20 (DRAPES) IMPLANT
DRSG COVADERM 4X8 (GAUZE/BANDAGES/DRESSINGS) IMPLANT
ELECT REM PT RETURN 9FT ADLT (ELECTROSURGICAL) ×2
ELECTRODE REM PT RTRN 9FT ADLT (ELECTROSURGICAL) ×1 IMPLANT
EVACUATOR SILICONE 100CC (DRAIN) IMPLANT
GLOVE BIO SURGEON STRL SZ 6.5 (GLOVE) ×1 IMPLANT
GLOVE BIO SURGEON STRL SZ7.5 (GLOVE) ×2 IMPLANT
GLOVE BIOGEL PI IND STRL 6.5 (GLOVE) IMPLANT
GLOVE BIOGEL PI IND STRL 7.5 (GLOVE) IMPLANT
GLOVE BIOGEL PI INDICATOR 6.5 (GLOVE) ×1
GLOVE BIOGEL PI INDICATOR 7.5 (GLOVE) ×1
GLOVE ECLIPSE 7.0 STRL STRAW (GLOVE) ×1 IMPLANT
GLOVE ECLIPSE 7.5 STRL STRAW (GLOVE) ×1 IMPLANT
GOWN STRL REUS W/ TWL LRG LVL3 (GOWN DISPOSABLE) ×3 IMPLANT
GOWN STRL REUS W/TWL LRG LVL3 (GOWN DISPOSABLE) ×4
KIT BASIN OR (CUSTOM PROCEDURE TRAY) ×2 IMPLANT
KIT ROOM TURNOVER OR (KITS) ×2 IMPLANT
LIQUID BAND (GAUZE/BANDAGES/DRESSINGS) ×1 IMPLANT
NDL HYPO 25GX1X1/2 BEV (NEEDLE) IMPLANT
NEEDLE HYPO 25GX1X1/2 BEV (NEEDLE) ×2 IMPLANT
NS IRRIG 1000ML POUR BTL (IV SOLUTION) ×3 IMPLANT
PACK PERIPHERAL VASCULAR (CUSTOM PROCEDURE TRAY) ×2 IMPLANT
PAD ARMBOARD 7.5X6 YLW CONV (MISCELLANEOUS) ×4 IMPLANT
PADDING CAST COTTON 6X4 STRL (CAST SUPPLIES) IMPLANT
PROBE PENCIL 8 MHZ STRL DISP (MISCELLANEOUS) ×1 IMPLANT
SET COLLECT BLD 21X3/4 12 (NEEDLE) IMPLANT
SPONGE SURGIFOAM ABS GEL 100 (HEMOSTASIS) IMPLANT
STAPLER VISISTAT 35W (STAPLE) IMPLANT
STOPCOCK 4 WAY LG BORE MALE ST (IV SETS) IMPLANT
SUT PROLENE 5 0 C 1 24 (SUTURE) IMPLANT
SUT PROLENE 6 0 CC (SUTURE) ×2 IMPLANT
SUT VIC AB 2-0 CTX 36 (SUTURE) ×1 IMPLANT
SUT VIC AB 3-0 SH 27 (SUTURE) ×2
SUT VIC AB 3-0 SH 27X BRD (SUTURE) ×1 IMPLANT
SUT VIC AB 4-0 PS2 27 (SUTURE) ×1 IMPLANT
SYR 3ML LL SCALE MARK (SYRINGE) ×2 IMPLANT
SYR CONTROL 10ML LL (SYRINGE) ×2 IMPLANT
TRAY FOLEY CATH 16FRSI W/METER (SET/KITS/TRAYS/PACK) ×1 IMPLANT
TUBING EXTENTION W/L.L. (IV SETS) IMPLANT
UNDERPAD 30X30 INCONTINENT (UNDERPADS AND DIAPERS) ×2 IMPLANT
WATER STERILE IRR 1000ML POUR (IV SOLUTION) ×1 IMPLANT

## 2014-03-21 NOTE — Progress Notes (Signed)
Patient has refused lab blood draw until he sees his MD in the morning. MD on call notified. Per MD, leave heparin rate at current setting since patient has refused all blood draw until morning. Will continue to monitor patient.

## 2014-03-21 NOTE — Progress Notes (Signed)
ANTICOAGULATION CONSULT NOTE - Initial Consult  Pharmacy Consult for heparin  Indication: afib embolus of upper extremity   No Known Allergies  Patient Measurements: Height: 5\' 7"  (170.2 cm) Weight: 141 lb (63.957 kg) IBW/kg (Calculated) : 66.1 Heparin Dosing Weight:   Vital Signs: Temp: 98 F (36.7 C) (01/12 2106) BP: 134/68 mmHg (01/12 2327) Pulse Rate: 67 (01/12 2327)  Labs:  Recent Labs  03/20/14 2130  HGB 12.5*  HCT 37.0*  PLT 202  LABPROT 21.7*  INR 1.87*  CREATININE 1.63*    Estimated Creatinine Clearance: 28.4 mL/min (by C-G formula based on Cr of 1.63).   Medical History: Past Medical History  Diagnosis Date  . Hypertension   . Shortness of breath     "w/any activity lately" (02/03/2012)  . CHF (congestive heart failure)     mild/note 02/03/2012  . Arthritis     "left foot; fingers" (02/03/2012)  . ARF (acute renal failure)   . GIB (gastrointestinal bleeding)   . Chronic atrial fibrillation   . Non-sustained ventricular tachycardia   . Chronic anticoagulation   . Coronary artery disease 05/2000    s/p CABG with LIMA to LAD, SVG to IM, SVG to OM  . Mitral valvular regurgitation     s/p MVR with mechanical valve  . Ischemic dilated cardiomyopathy     EF 25-30% by echo 01/2012 at time of colon surgery  . Hyperlipidemia   . Stroke     after CABG  . Carcinoma of colon   . Basal cell carcinoma of shoulder 10/2010    Medications:  Prescriptions prior to admission  Medication Sig Dispense Refill Last Dose  . acetaminophen (TYLENOL) 325 MG tablet Take 2 tablets (650 mg total) by mouth every 6 (six) hours as needed for mild pain (or Fever >/= 101).   Past Month at Unknown time  . doxycycline (VIBRA-TABS) 100 MG tablet Take 1 tablet (100 mg total) by mouth every 12 (twelve) hours.   03/20/2014 at Unknown time  . furosemide (LASIX) 40 MG tablet Take 1 tablet (40 mg total) by mouth 2 (two) times daily. 30 tablet  03/20/2014 at Unknown time  . hydrALAZINE  (APRESOLINE) 25 MG tablet Take 25 mg by mouth 3 (three) times daily.    03/20/2014 at 5p  . metoprolol (LOPRESSOR) 50 MG tablet Take 100 mg by mouth 2 (two) times daily.   03/20/2014 at 5p  . Multiple Vitamin (MULTIVITAMIN WITH MINERALS) TABS Take 1 tablet by mouth daily.   03/20/2014 at Unknown time  . mupirocin cream (BACTROBAN) 2 % Apply topically daily. 15 g 0 03/20/2014 at Unknown time  . omega-3 acid ethyl esters (LOVAZA) 1 G capsule Take 1 g by mouth 3 (three) times daily.   03/20/2014 at Unknown time  . ondansetron (ZOFRAN) 4 MG tablet Take 1 tablet (4 mg total) by mouth every 6 (six) hours as needed for nausea. 20 tablet 0 03/19/2014 at Unknown time  . pantoprazole (PROTONIX) 40 MG tablet Take 40 mg by mouth daily.   03/19/2014 at Unknown time  . polyethylene glycol (MIRALAX / GLYCOLAX) packet Take 17 g by mouth daily as needed for mild constipation. 14 each 0 03/19/2014 at Unknown time  . simvastatin (ZOCOR) 10 MG tablet Take 10 mg by mouth at bedtime.   03/19/2014 at Unknown time  . warfarin (COUMADIN) 2.5 MG tablet Take 2.5 mg by mouth See admin instructions. Patient takes Monday-Saturday   03/20/2014 at 5p  . warfarin (COUMADIN) 5 MG tablet  Take 5 mg by mouth See admin instructions. Only on Sunday's   03/18/2014 at 5p    Assessment:  81 yo with mechanical mitral valve dc on 1/6 to SNF where there was evidence of rectal bleeding of unknown source. INR was therapeutic at 3.1 however MD reversed with 2.5 mg vit K and held coumadin due to "supratherapeutic inr" which was actually in range. Pt subsequently presented with pain numbness and pallor of left palmar side of forearm.   Goal of Therapy:  Heparin level 0.3-0.7 units/ml Monitor platelets by anticoagulation protocol: Yes   Plan:  Heparin was started post procedure at 900 units/hr during downtime once patient arrived on 2W from pacu Heparin level at noon Daily HL and cbc to begin 1/14     Curlene Dolphin 03/21/2014,5:19 AM

## 2014-03-21 NOTE — Anesthesia Postprocedure Evaluation (Signed)
  Anesthesia Post-op Note  Patient: Alfred Dennis  Procedure(s) Performed: Procedure(s): LEFT BRACHIAL EMBOLECTOMY (Left)  Patient Location: PACU  Anesthesia Type:MAC  Level of Consciousness: awake, alert , oriented and patient cooperative  Airway and Oxygen Therapy: Patient Spontanous Breathing and Patient connected to nasal cannula oxygen  Post-op Pain: none  Post-op Assessment: Post-op Vital signs reviewed, Patient's Cardiovascular Status Stable, Respiratory Function Stable, Patent Airway, No signs of Nausea or vomiting and Pain level controlled  Post-op Vital Signs: Reviewed and stable  Last Vitals:  Filed Vitals:   03/21/14 1700  BP:   Pulse:   Temp: 36.4 C  Resp:     Complications: No apparent anesthesia complications

## 2014-03-21 NOTE — Progress Notes (Addendum)
ANTICOAGULATION CONSULT NOTE - Follow Up Consult  Pharmacy Consult for Heparin Indication: atrial fibrillation and mechanical valve; restart post-OR  No Known Allergies  Patient Measurements: Height: 5\' 7"  (170.2 cm) Weight: 142 lb 8 oz (64.638 kg) IBW/kg (Calculated) : 66.1 Heparin Dosing Weight: 64.6 kg   Vital Signs: Temp: 97.6 F (36.4 C) (01/13 1700) Temp Source: Oral (01/13 0621) BP: 144/64 mmHg (01/13 1730) Pulse Rate: 67 (01/13 1730)  Labs:  Recent Labs  03/20/14 2130 03/21/14 0729 03/21/14 1135  HGB 12.5* 11.8*  --   HCT 37.0* 35.9*  --   PLT 202 203  --   LABPROT 21.7* 22.2*  --   INR 1.87* 1.93*  --   HEPARINUNFRC  --  0.33 0.52  CREATININE 1.63* 1.33  --     Estimated Creatinine Clearance: 35.1 mL/min (by C-G formula based on Cr of 1.33).   Medications:  Infusions:  . sodium chloride    . heparin 900 Units/hr (03/21/14 1539)    Assessment: 81 year old male s/p embolectomy to resume IV heparin for mechanical mitral valve atrial fibrillation.   Heparin was therapeutic on 900 units/hr prior to OR. Patient received 5000 unit bolus in the OR.  INR 1.93 - Warfarin prior to admission but received Vitamin K at SNF.   Goal of Therapy:  Heparin level 0.3-0.7 units/ml Monitor platelets by anticoagulation protocol: Yes   Plan:  Heparin restart at 900 units/hr. No bolus.  Heparin level in 8 hours.  Daily heparin level and CBC while on therapy.   Sloan Leiter, PharmD, BCPS Clinical Pharmacist [phone] 03/21/2014,5:46 PM

## 2014-03-21 NOTE — ED Provider Notes (Signed)
CSN: [id number]     Arrival date & time 03/20/14  2055 History   First MD Initiated Contact with Patient 03/20/14 2056     Chief Complaint  Patient presents with  . Numbness     (Consider location/radiation/quality/duration/timing/severity/associated sxs/prior Treatment) HPI Comments: pain, numbness and pallor starting at the left palmar side of the forearm radiating distally while at rehabilitation this afternoon.  Patient states that the nurse massaged the forearm and he had some improvement though his hand was cold and blue.  No history of same.  He is in a rehabilitation facility after hospitalization due to cellulitis of the left foot which has been present.    Past Medical History  Diagnosis Date  . Hypertension   . Shortness of breath     "w/any activity lately" (02/03/2012)  . CHF (congestive heart failure)     mild/note 02/03/2012  . Arthritis     "left foot; fingers" (02/03/2012)  . ARF (acute renal failure)   . GIB (gastrointestinal bleeding)   . Chronic atrial fibrillation   . Non-sustained ventricular tachycardia   . Chronic anticoagulation   . Coronary artery disease 05/2000    s/p CABG with LIMA to LAD, SVG to IM, SVG to OM  . Mitral valvular regurgitation     s/p MVR with mechanical valve  . Ischemic dilated cardiomyopathy     EF 25-30% by echo 01/2012 at time of colon surgery  . Hyperlipidemia   . Stroke     after CABG  . Carcinoma of colon   . Basal cell carcinoma of shoulder 10/2010   Past Surgical History  Procedure Laterality Date  . Inguinal hernia repair      right  . Cataract extraction w/ intraocular lens  implant, bilateral  ~ 2011  . Valve replacement  05/2000    St. Jude 37mm mechanical valve  . Cardiac valve replacement  05/2000    St. Jude 68mm  . Esophagogastroduodenoscopy  02/08/2012    Procedure: ESOPHAGOGASTRODUODENOSCOPY (EGD);  Surgeon: Gatha Mayer, MD;  Location: [facility] ENDOSCOPY;  Service: Endoscopy;  Laterality: N/A;  . Colonoscopy   02/08/2012    Procedure: COLONOSCOPY;  Surgeon: Gatha Mayer, MD;  Location: [facility];  Service: Endoscopy;  Laterality: N/A;  . Laparoscopic partial colectomy  02/10/2012    Procedure: LAPAROSCOPIC PARTIAL COLECTOMY;  Surgeon: Stark Klein, MD;  Location: [facility] OR;  Service: General;  Laterality: N/A;  . Coronary artery bypass graft      LIMA to LAD, SVG to IM, SVG to OM2   Family History  Problem Relation Age of Onset  . Heart disease Mother    History  Substance Use Topics  . Smoking status: Former Smoker -- 1.00 packs/day for 20 years    Types: Cigarettes    Quit date: 03/09/1980  . Smokeless tobacco: Never Used     Comment: 02/03/2012 "quit smoking in my 49's"  . Alcohol Use: No    Review of Systems  Constitutional: Negative for fever, activity change, appetite change and fatigue.  HENT: Negative for congestion, facial swelling, rhinorrhea and trouble swallowing.   Eyes: Negative for photophobia and pain.  Respiratory: Negative for cough, chest tightness and shortness of breath.   Cardiovascular: Negative for chest pain and leg swelling.  Gastrointestinal: Negative for nausea, vomiting, abdominal pain, diarrhea and constipation.  Endocrine: Negative for polydipsia and polyuria.  Genitourinary: Negative for dysuria, urgency, decreased urine volume and difficulty urinating.  Musculoskeletal: Negative for back pain and gait problem.  Skin: Positive for color change and pallor. Negative for rash and wound.  Allergic/Immunologic: Negative for immunocompromised state.  Neurological: Negative for dizziness, facial asymmetry, speech difficulty, weakness, numbness and headaches.  Psychiatric/Behavioral: Negative for confusion, decreased concentration and agitation.      Allergies  Review of patient's allergies indicates no known allergies.  Home Medications   Prior to Admission medications   Medication Sig Start Date End Date Taking? Authorizing Provider  acetaminophen  (TYLENOL) 325 MG tablet Take 2 tablets (650 mg total) by mouth every 6 (six) hours as needed for mild pain (or Fever >/= 101). 03/14/14  Yes Srikar Janna Arch, MD  doxycycline (VIBRA-TABS) 100 MG tablet Take 1 tablet (100 mg total) by mouth every 12 (twelve) hours. 03/14/14 03/22/14 Yes Srikar Janna Arch, MD  furosemide (LASIX) 40 MG tablet Take 1 tablet (40 mg total) by mouth 2 (two) times daily. 03/14/14  Yes Srikar Janna Arch, MD  hydrALAZINE (APRESOLINE) 25 MG tablet Take 25 mg by mouth 3 (three) times daily.    Yes Historical Provider, MD  metoprolol (LOPRESSOR) 50 MG tablet Take 100 mg by mouth 2 (two) times daily.   Yes Historical Provider, MD  Multiple Vitamin (MULTIVITAMIN WITH MINERALS) TABS Take 1 tablet by mouth daily.   Yes Historical Provider, MD  mupirocin cream (BACTROBAN) 2 % Apply topically daily. 03/14/14  Yes Srikar Janna Arch, MD  omega-3 acid ethyl esters (LOVAZA) 1 G capsule Take 1 g by mouth 3 (three) times daily.   Yes Historical Provider, MD  ondansetron (ZOFRAN) 4 MG tablet Take 1 tablet (4 mg total) by mouth every 6 (six) hours as needed for nausea. 03/14/14  Yes Srikar Janna Arch, MD  pantoprazole (PROTONIX) 40 MG tablet Take 40 mg by mouth daily.   Yes Historical Provider, MD  polyethylene glycol (MIRALAX / GLYCOLAX) packet Take 17 g by mouth daily as needed for mild constipation. 03/14/14  Yes Srikar Janna Arch, MD  simvastatin (ZOCOR) 10 MG tablet Take 10 mg by mouth at bedtime.   Yes Historical Provider, MD  warfarin (COUMADIN) 2.5 MG tablet Take 2.5 mg by mouth See admin instructions. Patient takes Monday-Saturday   Yes Historical Provider, MD  warfarin (COUMADIN) 5 MG tablet Take 5 mg by mouth See admin instructions. Only on Sunday's   Yes Historical Provider, MD   BP 134/68 mmHg  Pulse 67  Temp(Src) 98 F (36.7 C)  Resp 16  Ht 5\' 7"  (1.702 m)  Wt 141 lb (63.957 kg)  BMI 22.08 kg/m2  SpO2 98% Physical Exam  Constitutional: He is oriented to person, place, and time. He appears  well-developed and well-nourished. No distress.  HENT:  Head: Normocephalic and atraumatic.  Mouth/Throat: No oropharyngeal exudate.  Eyes: Pupils are equal, round, and reactive to light.  Neck: Normal range of motion. Neck supple.  Cardiovascular: Normal rate, regular rhythm and normal heart sounds.  Exam reveals no gallop and no friction rub.   No murmur heard. Pulmonary/Chest: Effort normal and breath sounds normal. No respiratory distress. He has no wheezes. He has no rales.  Abdominal: Soft. Bowel sounds are normal. He exhibits no distension and no mass. There is no tenderness. There is no rebound and no guarding.  Musculoskeletal: Normal range of motion. He exhibits no edema or tenderness.       Arms: Pallor of the left hand.  Prolonged capillary refill nail cyanosis.  Radial and ulnar pulses are not palpable.  There are found with the Doppler.  They are  decreased as compared to the right hand  Neurological: He is alert and oriented to person, place, and time.  Skin: Skin is warm and dry.  Psychiatric: He has a normal mood and affect.    ED Course  Procedures (including critical care time) Labs Review Labs Reviewed  BASIC METABOLIC PANEL - Abnormal; Notable for the following:    Glucose, Bld 109 (*)    BUN 41 (*)    Creatinine, Ser 1.63 (*)    GFR calc non Af Amer 36 (*)    GFR calc Af Amer 42 (*)    All other components within normal limits  CBC - Abnormal; Notable for the following:    RBC 4.15 (*)    Hemoglobin 12.5 (*)    HCT 37.0 (*)    All other components within normal limits  PROTIME-INR - Abnormal; Notable for the following:    Prothrombin Time 21.7 (*)    INR 1.87 (*)    All other components within normal limits    Imaging Review No results found.   EKG Interpretation   Date/Time:  Tuesday March 20 2014 21:05:39 EST Ventricular Rate:  68 PR Interval:    QRS Duration: 144 QT Interval:  465 QTC Calculation: 495 R Axis:   -20 Text Interpretation:   Atrial fibrillation LVH with secondary  repolarization abnormality No significant change since last tracing  Confirmed by DOCHERTY  MD, MEGAN [phone]) on 03/20/2014 9:30:37 PM      MDM   Final diagnoses:  None    Pt is a 81 y.o. male with Pmhx as above who presents with pain, numbness and pallor starting at the left palmar side of the forearm radiating distally while at rehabilitation this afternoon.  Patient states that the nurse massaged the forearm and he had some improvement though his hand was cold and blue.  On physical exam, vital signs stable and patient no acute distress.  He is currently in A. fib which she has a history of.  He has ecchymosis on the palmar aspect of the left antecubital fossa, which she states is due to IV sticks.  Hand is pale with prolonged Refill, cyanotic nails.  Neither the radial or ulnar pulses are palpable although they are found with Doppler.  He reports some decreased sensation in an ulnar distribution.  He has normal strength bilaterally.  He has no other neuro findings.   INR is subtherapeutic.  I spoke with Dr. Oneida Alar with vascular surgery.  He will see him in the ED.  Patient and son are aware.  On repeat exam and is still cool, though somewhat improved and he does now have weak palpable radial pulse.      Ernestina Patches, MD 03/21/14 [phone]

## 2014-03-21 NOTE — Progress Notes (Signed)
Pt arrived from floor for an embolectomy with IV Heparin infusing, called Dr. Oneida Alar to question whether or not to stop Heparin; continue infusing IV Heparin per Dr. Oneida Alar.

## 2014-03-21 NOTE — Anesthesia Procedure Notes (Signed)
Procedure Name: MAC Date/Time: 03/21/2014 3:45 PM Performed by: Carola Frost Pre-anesthesia Checklist: Patient identified, Emergency Drugs available, Suction available, Patient being monitored and Timeout performed Patient Re-evaluated:Patient Re-evaluated prior to inductionOxygen Delivery Method: Simple face mask Preoxygenation: Pre-oxygenation with 100% oxygen Intubation Type: IV induction Placement Confirmation: positive ETCO2 and breath sounds checked- equal and bilateral Dental Injury: Teeth and Oropharynx as per pre-operative assessment

## 2014-03-21 NOTE — Op Note (Signed)
Procedure: Brachial ulnar and radial artery embolectomy left arm  Preoperative diagnosis: Acute ischemia left hand  Postoperative diagnosis: Same  Anesthesia: Gen.  Assistant: Gerri Lins, PA-C.  Operative findings: #1 acute embolus brachial bifurcation and distal radial and ulnar arteries  Specimens: Embolic material  Operative details: After obtaining informed consent, the patient was taken to the operating room. The patient was placed in supine position on the operating room table. After adequate sedation, the patient's entire left upper extremity was prepped and draped in the usual sterile fashion. Next, a longitudinal incision was made on the left arm at the level of the antecubital crease. The incision was carried down through the subcutaneous tissues down to the level of the bicipital aponeurosis. This was taken down with cautery. The brachial artery was dissected free circumferentially. There was visible thrombus within the brachial artery. Dissection was carried down to the bifurcation of the brachial artery. The radial and ulnar arteries were dissected free circumferentially and vessel loops were placed around these. The patient had been placed on a heparin drip preoperatively. He was given an additional bolus of 5000 units of intravenous heparin. After 2 minutes of circulation time, Vesseloops were used to control the proximal brachial artery as well as the radial and ulnar arteries. A transverse arteriotomy was made just above the brachial bifurcation. There was visible embolic material within the artery. This was removed under direct vision. A #3 Fogarty catheter was then passed proximally up the brachial artery with multiple to make sure that all embolic material was removed and there was brisk arterial inflow. 2 clean passes were obtained. This was then reoccluded with a vessel loop. I then proceeded to pass a #3 Fogarty catheter down the radial and ulnar arteries.  Small amounts of  embolic material were removed from both of these. Catheters were passed down until the catheter was at least 30 cm and 2 clean passes were obtained. Each branch was then thoroughly flushed with heparinized saline. All embolic material was sent to pathology as specimen. The arteriotomy was then repaired using a running 6-0 Prolene suture. At completion of the anastomosis everything was forebled backbled and thoroughly flushed. The anastomosis was secured.  Vesseloops were released and there was a palpable radial and ulnar pulse immediately. There was biphasic radial and ulnar Doppler flow.  There was good flow in the brachial artery. There was good Doppler flow in the proximal radial and ulnar arteries. At this point hemostasis was obtained. Subcutaneous tissues were reapproximated using running 3-0 Vicryl suture. The skin was closed with a 4 0 Vicryl subcuticular stitch. Dermabond was applied. The patient tolerated the procedure well and there were no complications. Instrument sponge and needle counts were correct at the end of the case. Patient was taken to recovery in stable condition.  Ruta Hinds, MD [facility] Office: [phone] Pager: [phone]

## 2014-03-21 NOTE — Progress Notes (Signed)
[facility] for heparin  Indication: afib embolus of upper extremity   No Known Allergies  Patient Measurements: Height: 5\' 7"  (170.2 cm) Weight: 142 lb 8 oz (64.638 kg) IBW/kg (Calculated) : 66.1 Heparin Dosing Weight:   Vital Signs: Temp: 97.8 F (36.6 C) (01/13 0621) Temp Source: Oral (01/13 0621) BP: 106/56 mmHg (01/13 0621) Pulse Rate: 88 (01/13 0621)  Labs:  Recent Labs  03/20/14 2130 03/21/14 0729 03/21/14 1135  HGB 12.5* 11.8*  --   HCT 37.0* 35.9*  --   PLT 202 203  --   LABPROT 21.7* 22.2*  --   INR 1.87* 1.93*  --   HEPARINUNFRC  --  0.33 0.52  CREATININE 1.63* 1.33  --     Estimated Creatinine Clearance: 35.1 mL/min (by C-G formula based on Cr of 1.33).   Medical History: Past Medical History  Diagnosis Date  . Hypertension   . Shortness of breath     "w/any activity lately" (02/03/2012)  . CHF (congestive heart failure)     mild/note 02/03/2012  . Arthritis     "left foot; fingers" (02/03/2012)  . ARF (acute renal failure)   . GIB (gastrointestinal bleeding)   . Chronic atrial fibrillation   . Non-sustained ventricular tachycardia   . Chronic anticoagulation   . Coronary artery disease 05/2000    s/p CABG with LIMA to LAD, SVG to IM, SVG to OM  . Mitral valvular regurgitation     s/p MVR with mechanical valve  . Ischemic dilated cardiomyopathy     EF 25-30% by echo 01/2012 at time of colon surgery  . Hyperlipidemia   . Stroke     after CABG  . Carcinoma of colon   . Basal cell carcinoma of shoulder 10/2010    Medications:  Prescriptions prior to admission  Medication Sig Dispense Refill Last Dose  . acetaminophen (TYLENOL) 325 MG tablet Take 2 tablets (650 mg total) by mouth every 6 (six) hours as needed for mild pain (or Fever >/= 101).   Past Month at Unknown time  . doxycycline (VIBRA-TABS) 100 MG tablet Take 1 tablet (100 mg total) by mouth every 12 (twelve) hours.   03/20/2014 at Unknown time  .  furosemide (LASIX) 40 MG tablet Take 1 tablet (40 mg total) by mouth 2 (two) times daily. 30 tablet  03/20/2014 at Unknown time  . hydrALAZINE (APRESOLINE) 25 MG tablet Take 25 mg by mouth 3 (three) times daily.    03/20/2014 at 5p  . metoprolol (LOPRESSOR) 50 MG tablet Take 100 mg by mouth 2 (two) times daily.   03/20/2014 at 5p  . Multiple Vitamin (MULTIVITAMIN WITH MINERALS) TABS Take 1 tablet by mouth daily.   03/20/2014 at Unknown time  . mupirocin cream (BACTROBAN) 2 % Apply topically daily. 15 g 0 03/20/2014 at Unknown time  . omega-3 acid ethyl esters (LOVAZA) 1 G capsule Take 1 g by mouth 3 (three) times daily.   03/20/2014 at Unknown time  . ondansetron (ZOFRAN) 4 MG tablet Take 1 tablet (4 mg total) by mouth every 6 (six) hours as needed for nausea. 20 tablet 0 03/19/2014 at Unknown time  . pantoprazole (PROTONIX) 40 MG tablet Take 40 mg by mouth daily.   03/19/2014 at Unknown time  . polyethylene glycol (MIRALAX / GLYCOLAX) packet Take 17 g by mouth daily as needed for mild constipation. 14 each 0 03/19/2014 at Unknown time  . simvastatin (ZOCOR) 10 MG tablet Take 10 mg by  mouth at bedtime.   03/19/2014 at Unknown time  . warfarin (COUMADIN) 2.5 MG tablet Take 2.5 mg by mouth See admin instructions. Patient takes Monday-Saturday   03/20/2014 at 5p  . warfarin (COUMADIN) 5 MG tablet Take 5 mg by mouth See admin instructions. Only on Sunday's   03/18/2014 at 5p    Assessment:  81 yo with mechanical mitral valve dc on 1/6 to SNF where there was evidence of rectal bleeding of unknown source. INR was therapeutic at 3.1 however MD reversed with 2.5 mg vit K and held coumadin due to "supratherapeutic inr" which was actually in range. Pt subsequently presented with pain numbness and pallor of left palmar side of forearm.   Patient started on IV heparin overnight. Follow up heparin level on 900 units/hr was at goal (0.5). No bleeding issues noted.  Vascular lab noted thrombus in bifurcation of the brachial  artery.  Goal of Therapy:  Heparin level 0.3-0.7 units/ml Monitor platelets by anticoagulation protocol: Yes   Plan:  Continue heparin at 900 units/hr Daily HL/CBC Follow up resuming warfarin    Erin Hearing PharmD., BCPS Clinical Pharmacist Pager [phone] 03/21/2014 2:10 PM

## 2014-03-21 NOTE — Anesthesia Preprocedure Evaluation (Addendum)
Anesthesia Evaluation  Patient identified by MRN, date of birth, ID band Patient awake    Reviewed: Allergy & Precautions, NPO status , Patient's Chart, lab work & pertinent test results, reviewed documented beta blocker date and time   History of Anesthesia Complications Negative for: history of anesthetic complications  Airway Mallampati: II  TM Distance: >3 FB Neck ROM: Full    Dental  (+) Poor Dentition, Chipped, Missing, Dental Advisory Given   Pulmonary shortness of breath, former smoker (quit '82),  breath sounds clear to auscultation        Cardiovascular hypertension, Pt. on medications and Pt. on home beta blockers + CAD, + CABG, + Peripheral Vascular Disease (brachial artery thrombosis) and +CHF + dysrhythmias (NSVT, coumadin) Atrial Fibrillation and Ventricular Tachycardia + Valvular Problems/Murmurs (now s/p MVR 2002) MR Rhythm:Irregular Rate:Normal  1/16 ECHO: EF 25-30%, dilated cardiomyopathy, St. Jude MVR OK   Neuro/Psych negative neurological ROS     GI/Hepatic negative GI ROS, Neg liver ROS,   Endo/Other    Renal/GU Renal InsufficiencyRenal disease (creat 1.33)     Musculoskeletal  (+) Arthritis -,   Abdominal   Peds  Hematology  (+) Blood dyscrasia (Hb 11.8, INR 1.93), ,   Anesthesia Other Findings   Reproductive/Obstetrics                          Anesthesia Physical Anesthesia Plan  ASA: III  Anesthesia Plan: MAC   Post-op Pain Management:    Induction:   Airway Management Planned: Natural Airway and Nasal Cannula  Additional Equipment:   Intra-op Plan:   Post-operative Plan:   Informed Consent: I have reviewed the patients History and Physical, chart, labs and discussed the procedure including the risks, benefits and alternatives for the proposed anesthesia with the patient or authorized representative who has indicated his/her understanding and acceptance.    Dental advisory given  Plan Discussed with: CRNA and Surgeon  Anesthesia Plan Comments: (Plan routine monitors, MAC)        Anesthesia Quick Evaluation

## 2014-03-21 NOTE — Progress Notes (Addendum)
VASCULAR LAB PRELIMINARY  PRELIMINARY  PRELIMINARY  PRELIMINARY  Left upper extremity arterial duplex completed.    Preliminary report:  Thrombus noted in the bifurcation of the brachial artery.  Both the radial and ulnar arteries are compromised.  Thrombus appears to be only in the origin of the radial and more extensive in the ulnar.   Ulnar waveforms at the wrist are dampened with no diastolic component.  Dr Oneida Alar notified via [facility] in the OR.  Edna Grover, RVT 03/21/2014, 9:19 AM

## 2014-03-21 NOTE — Progress Notes (Signed)
Utilization review completed.  

## 2014-03-21 NOTE — Transfer of Care (Signed)
Immediate Anesthesia Transfer of Care Note  Patient: Alfred Dennis  Procedure(s) Performed: Procedure(s): LEFT BRACHIAL EMBOLECTOMY (Left)  Patient Location: PACU  Anesthesia Type:MAC  Level of Consciousness: awake, alert , oriented and patient cooperative  Airway & Oxygen Therapy: Patient Spontanous Breathing and Patient connected to nasal cannula oxygen  Post-op Assessment: Report given to PACU RN, Post -op Vital signs reviewed and stable and Patient moving all extremities  Post vital signs: Reviewed and stable  Complications: No apparent anesthesia complications

## 2014-03-22 LAB — COMPREHENSIVE METABOLIC PANEL
ALBUMIN: 3.4 g/dL — AB (ref 3.5–5.2)
ALK PHOS: 70 U/L (ref 39–117)
ALT: 31 U/L (ref 0–53)
AST: 91 U/L — AB (ref 0–37)
Anion gap: 6 (ref 5–15)
BILIRUBIN TOTAL: 0.8 mg/dL (ref 0.3–1.2)
BUN: 31 mg/dL — ABNORMAL HIGH (ref 6–23)
CALCIUM: 9.4 mg/dL (ref 8.4–10.5)
CHLORIDE: 107 meq/L (ref 96–112)
CO2: 26 mmol/L (ref 19–32)
Creatinine, Ser: 1.4 mg/dL — ABNORMAL HIGH (ref 0.50–1.35)
GFR calc Af Amer: 50 mL/min — ABNORMAL LOW (ref >90)
GFR calc non Af Amer: 43 mL/min — ABNORMAL LOW (ref >90)
Glucose, Bld: 146 mg/dL — ABNORMAL HIGH (ref 70–99)
Potassium: 4.3 mmol/L (ref 3.5–5.1)
SODIUM: 139 mmol/L (ref 135–145)
Total Protein: 6.5 g/dL (ref 6.0–8.3)

## 2014-03-22 LAB — CBC
HCT: 37.4 % — ABNORMAL LOW (ref 39.0–52.0)
Hemoglobin: 12.4 g/dL — ABNORMAL LOW (ref 13.0–17.0)
MCH: 30.2 pg (ref 26.0–34.0)
MCHC: 33.2 g/dL (ref 30.0–36.0)
MCV: 91 fL (ref 78.0–100.0)
Platelets: 205 10*3/uL (ref 150–400)
RBC: 4.11 MIL/uL — ABNORMAL LOW (ref 4.22–5.81)
RDW: 14.5 % (ref 11.5–15.5)
WBC: 8.7 10*3/uL (ref 4.0–10.5)

## 2014-03-22 LAB — PROTIME-INR
INR: 2.08 — ABNORMAL HIGH (ref 0.00–1.49)
PROTHROMBIN TIME: 23.6 s — AB (ref 11.6–15.2)

## 2014-03-22 NOTE — Clinical Social Work Note (Signed)
CSW spoke with patient - does not want to return to SNF- has daughter who can provide supervision/support at home. RNCM aware- daughter is on board with patient returning home.  Full assessment to follow.  CSW signing off.  Domenica Reamer, [place] Social Worker [phone]

## 2014-03-22 NOTE — Clinical Social Work Psychosocial (Signed)
Clinical Social Work Department BRIEF PSYCHOSOCIAL ASSESSMENT 03/22/2014  Patient:  Alfred Dennis, Alfred Dennis     Account Number:  [id number]     Admit date:  03/20/2014  Clinical Social Worker:  Domenica Reamer, [place]  Date/Time:  03/22/2014 01:34 PM  Referred by:  Physician  Date Referred:  03/22/2014 Referred for  SNF Placement   Other Referral:   Interview type:  Patient Other interview type:    PSYCHOSOCIAL DATA Living Status:  WIFE Admitted from facility:  [facility] Level of care:  [place] Primary support name:  Pamalee Leyden Primary support relationship to patient:  CHILD, ADULT Degree of support available:   high level of support- daughter to live with patient and patients wife (who has Alzheimers) after pt DCs from hospital    CURRENT CONCERNS Current Concerns  Post-Acute Placement   Other Concerns:    SOCIAL WORK ASSESSMENT / PLAN CSW spoke with patient concerning return to [facility] at time of DC.  Patient states that he wants to come home without home health services- reports that he feels comfortable doing the excercises on his own.  Pt states that his daughter will be living with pt and pts wife while he recovers.   Assessment/plan status:  Psychosocial Support/Ongoing Assessment of Needs Other assessment/ plan:   Information/referral to community resources:   RNCM for home health needs    PATIENT'S/FAMILY'S RESPONSE TO PLAN OF CARE: Patient is not agreeable to return to SNF and prioritizes geting home to his wife.       CSW signing out.  Domenica Reamer, [place] Social Worker [phone]

## 2014-03-22 NOTE — Evaluation (Signed)
Occupational Therapy Evaluation Patient Details Name: Alfred Dennis MRN: [id number] DOB: May 20, 1923 Today's Date: 03/22/2014    History of Present Illness 81 y.o. male s/p LEFT BRACHIAL EMBOLECTOMY.   Clinical Impression   Pt was able to perform ADL and light housekeeping/cooking prior to recent admission after a fall and subsequent rehab in SNF from which he was readmitted.  Pt presents with impaired balance and generalized weakness.  Pt with questionable safety and judgement.  Does not feel he needs to use the RW once he is home, that he has plenty of places he can hold for balance.  Daughter can provide 24 hour care.  Pt states he has had multiple episodes with home health and does not need it upon d/c. Will likely decline.  Follow Up Recommendations  Home health OT;Supervision/Assistance - 24 hour    Equipment Recommendations  None recommended by OT    Recommendations for Other Services       Precautions / Restrictions Precautions Precautions: Fall Precaution Comments: pt with recent fall resulting in hospitalization and rehab in SNF Restrictions Weight Bearing Restrictions: No      Mobility Bed Mobility Overal bed mobility: Needs Assistance Bed Mobility: Supine to Sit     Supine to sit: Supervision     General bed mobility comments: up in chair  Transfers Overall transfer level: Needs assistance Equipment used: Rolling walker (2 wheeled) Transfers: Sit to/from Stand Sit to Stand: Min guard         General transfer comment: VCs for hand placement     Balance Overall balance assessment: Needs assistance Sitting-balance support: No upper extremity supported;Feet supported Sitting balance-Leahy Scale: Good     Standing balance support: No upper extremity supported Standing balance-Leahy Scale: Fair                              ADL Overall ADL's : Needs assistance/impaired Eating/Feeding: Independent;Sitting Eating/Feeding Details  (indicate cue type and reason): including opening packaging Grooming: Wash/dry hands;Standing;Supervision/safety   Upper Body Bathing: Supervision/ safety;Sitting   Lower Body Bathing: Minimal assistance;Sit to/from stand   Upper Body Dressing : Supervision/safety;Sitting   Lower Body Dressing: Minimal assistance;Sit to/from stand   Toilet Transfer: Supervision/safety;RW;Ambulation;Comfort height toilet;Grab bars   Toileting- Clothing Manipulation and Hygiene: Minimal assistance;Sit to/from stand       Functional mobility during ADLs: Supervision/safety;Rolling walker General ADL Comments: Pt able to doff socks, but needing min assist to donn.  Pt with some difficulty communicating PLOF and events at SNF.     Vision                     Perception     Praxis      Pertinent Vitals/Pain Pain Assessment: No/denies pain Pain Score: 0-No pain Pain Location: Lt arm, at surgical site Pain Descriptors / Indicators: Burning Pain Intervention(s): Premedicated before session     Hand Dominance Left   Extremity/Trunk Assessment Upper Extremity Assessment Upper Extremity Assessment: Overall WFL for tasks assessed (L UE bruising and edema at surgical site)   Lower Extremity Assessment Lower Extremity Assessment: Defer to PT evaluation       Communication Communication Communication: HOH   Cognition Arousal/Alertness: Awake/alert Behavior During Therapy: WFL for tasks assessed/performed Overall Cognitive Status: No family/caregiver present to determine baseline cognitive functioning       Memory: Decreased short-term memory, decreased awareness of safety and deficits  General Comments       Exercises       Shoulder Instructions      Home Living Family/patient expects to be discharged to:: Private residence Living Arrangements: Spouse/significant other Available Help at Discharge: Family;Available 24 hours/day Type of Home: House Home  Access: Stairs to enter [employer] of Steps: 1 Entrance Stairs-Rails: Right Home Layout: One level     Bathroom Shower/Tub: Tub/shower unit Shower/tub characteristics: Door Biochemist, clinical: Standard     Home Equipment: Grab bars - tub/shower;Shower seat;Bedside commode;Walker - 2 wheels;Cane - quad          Prior Functioning/Environment Level of Independence: Needs assistance  Gait / Transfers Assistance Needed: ambulating with RW at SNF, used a quad cane or furniture walked at home ADL's / [place] Needed: States he was receiving help with bath and dress at [place], but was capable of doing these things on his own        OT Diagnosis: Generalized weakness   OT Problem List: Decreased strength;Decreased activity tolerance;Decreased knowledge of use of DME or AE;Decreased safety awareness   OT Treatment/Interventions: Self-care/ADL training;DME and/or AE instruction;Patient/family education    OT Goals(Current goals can be found in the care plan section) Acute Rehab OT Goals Patient Stated Goal: return home OT Goal Formulation: With patient Time For Goal Achievement: 03/29/14 Potential to Achieve Goals: Good ADL Goals Pt Will Perform Grooming: with modified independence;standing Pt Will Perform Lower Body Dressing: with modified independence;sit to/from stand Pt Will Transfer to Toilet: with modified independence;ambulating;regular height toilet Pt Will Perform Toileting - Clothing Manipulation and hygiene: with modified independence;sit to/from stand  OT Frequency: Min 2X/week   Barriers to D/C:            Co-evaluation              End of Session Equipment Utilized During Treatment: Gait belt;Rolling walker  Activity Tolerance: Patient tolerated treatment well Patient left: in chair;with call bell/phone within reach   Time: 1015-1039 OT Time Calculation (min): 24 min Charges:  OT General Charges $OT Visit: 1 Procedure OT  Evaluation $Initial OT Evaluation Tier I: 1 Procedure OT Treatments $Self Care/Home Management : 8-22 mins G-Codes:    [name] 03/22/2014, 10:41 AM

## 2014-03-22 NOTE — Progress Notes (Addendum)
[place] for heparin/warfarin  Indication: afib embolus of upper extremity   No Known Allergies  Patient Measurements: Height: 5\' 7"  (170.2 cm) Weight: 142 lb 8 oz (64.638 kg) IBW/kg (Calculated) : 66.1   Vital Signs: Temp: 97.7 F (36.5 C) (01/14 1428) Temp Source: Oral (01/14 1428) BP: 97/45 mmHg (01/14 1428) Pulse Rate: 75 (01/14 1428)  Labs:  Recent Labs  03/20/14 2130 03/21/14 0729 03/21/14 1135 03/22/14 1030  HGB 12.5* 11.8*  --  12.4*  HCT 37.0* 35.9*  --  37.4*  PLT 202 203  --  205  LABPROT 21.7* 22.2*  --  23.6*  INR 1.87* 1.93*  --  2.08*  HEPARINUNFRC  --  0.33 0.52  --   CREATININE 1.63* 1.33  --  1.40*    Estimated Creatinine Clearance: 33.3 mL/min (by C-G formula based on Cr of 1.4).   Medical History: Past Medical History  Diagnosis Date  . Hypertension   . Shortness of breath     "w/any activity lately" (02/03/2012)  . CHF (congestive heart failure)     mild/note 02/03/2012  . Arthritis     "left foot; fingers" (02/03/2012)  . ARF (acute renal failure)   . GIB (gastrointestinal bleeding)   . Chronic atrial fibrillation   . Non-sustained ventricular tachycardia   . Chronic anticoagulation   . Coronary artery disease 05/2000    s/p CABG with LIMA to LAD, SVG to IM, SVG to OM  . Mitral valvular regurgitation     s/p MVR with mechanical valve  . Ischemic dilated cardiomyopathy     EF 25-30% by echo 01/2012 at time of colon surgery  . Hyperlipidemia   . Stroke     after CABG  . Carcinoma of colon   . Basal cell carcinoma of shoulder 10/2010    Medications:  Prescriptions prior to admission  Medication Sig Dispense Refill Last Dose  . acetaminophen (TYLENOL) 325 MG tablet Take 2 tablets (650 mg total) by mouth every 6 (six) hours as needed for mild pain (or Fever >/= 101).   Past Month at Unknown time  . doxycycline (VIBRA-TABS) 100 MG tablet Take 1 tablet (100 mg total) by mouth every 12 (twelve)  hours.   03/20/2014 at Unknown time  . furosemide (LASIX) 40 MG tablet Take 1 tablet (40 mg total) by mouth 2 (two) times daily. 30 tablet  03/20/2014 at Unknown time  . hydrALAZINE (APRESOLINE) 25 MG tablet Take 25 mg by mouth 3 (three) times daily.    03/20/2014 at 5p  . metoprolol (LOPRESSOR) 50 MG tablet Take 100 mg by mouth 2 (two) times daily.   03/20/2014 at 5p  . Multiple Vitamin (MULTIVITAMIN WITH MINERALS) TABS Take 1 tablet by mouth daily.   03/20/2014 at Unknown time  . mupirocin cream (BACTROBAN) 2 % Apply topically daily. 15 g 0 03/20/2014 at Unknown time  . omega-3 acid ethyl esters (LOVAZA) 1 G capsule Take 1 g by mouth 3 (three) times daily.   03/20/2014 at Unknown time  . ondansetron (ZOFRAN) 4 MG tablet Take 1 tablet (4 mg total) by mouth every 6 (six) hours as needed for nausea. 20 tablet 0 03/19/2014 at Unknown time  . pantoprazole (PROTONIX) 40 MG tablet Take 40 mg by mouth daily.   03/19/2014 at Unknown time  . polyethylene glycol (MIRALAX / GLYCOLAX) packet Take 17 g by mouth daily as needed for mild constipation. 14 each 0 03/19/2014 at Unknown time  . simvastatin (  ZOCOR) 10 MG tablet Take 10 mg by mouth at bedtime.   03/19/2014 at Unknown time  . warfarin (COUMADIN) 2.5 MG tablet Take 2.5 mg by mouth See admin instructions. Patient takes Monday-Saturday   03/20/2014 at 5p  . warfarin (COUMADIN) 5 MG tablet Take 5 mg by mouth See admin instructions. Only on Sunday's   03/18/2014 at 5p    Assessment:  81 yo with mechanical mitral valve dc on 1/6 to SNF where there was evidence of rectal bleeding of unknown source. INR was therapeutic at 3.1 however MD reversed with 2.5 mg vit K and held coumadin due to "supratherapeutic inr" which was actually in range. Pt subsequently presented with pain numbness and pallor of left palmar side of forearm.   Patient started on IV heparin 1/13. Follow up heparin level on 900 units/hr was at goal (0.5). No bleeding issues noted. No heparin level today,  first once refused and second one unfortunately unable to run d/t lab machine error. INR is up to 2.08 this afternoon (goal 2.5-3.5) will continue heparin another day at least. Warfarin reordered by MD today, will leave on home dose for now and follow INR trend.  3 cm hematoma left arm noted this morning. Will continue to follow, may need discontinuation of heparin if worsens.  Goal of Therapy:  INR goal 2.5-3.5 Heparin level 0.3-0.7 units/ml Monitor platelets by anticoagulation protocol: Yes   Plan:  Continue heparin at 900 units/hr Daily HL/CBC Warfarin 5mg  tonight  Erin Hearing PharmD., BCPS Clinical Pharmacist Pager [phone] 03/22/2014 3:37 PM

## 2014-03-22 NOTE — Evaluation (Signed)
Physical Therapy Evaluation Patient Details Name: Alfred Dennis MRN: [id number] DOB: 1923-07-12 Today's Date: 03/22/2014   History of Present Illness  81 y.o. male s/p LEFT BRACHIAL EMBOLECTOMY.  Clinical Impression  Pt is seen following the above procedure. Pt currently with functional limitations due to the deficits listed below (see PT Problem List). He was previously receiving rehabilitation at [facility] prior to admission however reports he was due to discharge 01/14 potentially. Daughter present and reports she would like for pt to return home and can provide 24 hour supervision. Patient is ambulating well up to 200 feet with a rolling walker for support at a supervision level. Will follow up tomorrow for stair training. Pt will benefit from skilled PT to increase their independence and safety with mobility to allow discharge to the venue listed below.       Follow Up Recommendations Home health PT;Supervision for mobility/OOB    Equipment Recommendations  None recommended by PT    Recommendations for Other Services OT consult     Precautions / Restrictions Precautions Precautions: Fall Restrictions Weight Bearing Restrictions: No      Mobility  Bed Mobility Overal bed mobility: Needs Assistance Bed Mobility: Supine to Sit     Supine to sit: Supervision     General bed mobility comments: Supervision for safety. Did not require physical assist. Able to perform with correct technique from flat bed surface  Transfers Overall transfer level: Needs assistance Equipment used: Rolling walker (2 wheeled) Transfers: Sit to/from Stand Sit to Stand: Min guard         General transfer comment: Close guard for safety. VC for hand placement. Slightly leaning posteriorly but able to self correct from lowest bed setting  Ambulation/Gait Ambulation/Gait assistance: Supervision Ambulation Distance (Feet): 200 Feet Assistive device: Rolling walker (2 wheeled) Gait  Pattern/deviations: Step-through pattern;Decreased stride length;Trunk flexed     General Gait Details: VC for walker placement within base of support and upright posture. No loss of balance while using RW for support. Educated pt and daughter on safe use of DME while using a rolling walker.   Stairs            Wheelchair Mobility    Modified Rankin (Stroke Patients Only)       Balance Overall balance assessment: Needs assistance Sitting-balance support: No upper extremity supported;Feet supported Sitting balance-Leahy Scale: Good     Standing balance support: No upper extremity supported Standing balance-Leahy Scale: Fair                               Pertinent Vitals/Pain Pain Assessment: 0-10 Pain Score: 0-No pain Pain Location: Lt arm, at surgical site Pain Descriptors / Indicators: Burning Pain Intervention(s): Monitored during session;Repositioned  SpO2 99% on room air.    Home Living Family/patient expects to be discharged to:: Unsure Living Arrangements: Spouse/significant other Available Help at Discharge: Family;Available 24 hours/day (wife with dementia. Daughter to stay with pt 24/7 ) Type of Home: House Home Access: Stairs to enter Entrance Stairs-Rails: None Entrance Stairs-Number of Steps: 1 Home Layout: One level Home Equipment: Grab bars - tub/shower;Shower seat;Bedside commode;Walker - 2 wheels      Prior Function Level of Independence: Needs assistance   Gait / Transfers Assistance Needed: ambulating with RW  ADL's / Homemaking Assistance Needed: States he was receiving help with bath and dress at [place], but was capable of doing these things on his own  Hand Dominance   Dominant Hand: Left    Extremity/Trunk Assessment   Upper Extremity Assessment: Defer to OT evaluation           Lower Extremity Assessment: Generalized weakness (decreased light touch sensation bil LE)         Communication    Communication: No difficulties  Cognition Arousal/Alertness: Awake/alert Behavior During Therapy: WFL for tasks assessed/performed Overall Cognitive Status: Within Functional Limits for tasks assessed                      General Comments General comments (skin integrity, edema, etc.): Daughter was present and very involved in pts care. States she plans to care for pt as long as needed at home.    Exercises        Assessment/Plan    PT Assessment Patient needs continued PT services  PT Diagnosis Difficulty walking;Generalized weakness;Abnormality of gait   PT Problem List Decreased strength;Decreased activity tolerance;Decreased balance;Decreased mobility;Decreased knowledge of use of DME  PT Treatment Interventions DME instruction;Gait training;Stair training;Functional mobility training;Therapeutic activities;Therapeutic exercise;Balance training;Neuromuscular re-education;Patient/family education   PT Goals (Current goals can be found in the Care Plan section) Acute Rehab PT Goals Patient Stated Goal: return home PT Goal Formulation: With patient/family Time For Goal Achievement: 04/05/14 Potential to Achieve Goals: Good    Frequency Min 3X/week   Barriers to discharge        Co-evaluation               End of Session Equipment Utilized During Treatment: Gait belt Activity Tolerance: Patient tolerated treatment well Patient left: in chair;with call bell/phone within reach;with family/visitor present Nurse Communication: Mobility status         Time: 0321-2248 PT Time Calculation (min) (ACUTE ONLY): 28 min   Charges:   PT Evaluation $Initial PT Evaluation Tier I: 1 Procedure PT Treatments $Gait Training: 8-22 mins   PT G CodesEllouise Newer 03/22/2014, 9:59 AM Elayne Snare, [place]

## 2014-03-22 NOTE — Progress Notes (Addendum)
  Progress Note    03/22/2014 8:03 AM 1 Day Post-Op  Subjective:  "My hand feels about the same".  States his arm is starting to hurt around the incision site.   Pt refused blood draw this am b/c he doesn't want to get more clot.    Afebrile HR  50's-70's Afib 409'W-119'J systolic 47% RA  Filed Vitals:   03/22/14 0642  BP: 126/63  Pulse: 62  Temp: 97.3 F (36.3 C)  Resp: 19    Physical Exam: Cardiac:  irregular Lungs:  Non labored  Incisions:  In tact with small to moderate hematoma and ecchymosis around incision  Extremities:  Palpable left radial & ulnar pulses   CBC    Component Value Date/Time   WBC 7.0 03/21/2014 0729   WBC 9.3 07/07/2013 1450   RBC 4.03* 03/21/2014 0729   RBC 4.16* 07/07/2013 1450   HGB 11.8* 03/21/2014 0729   HGB 12.2* 07/07/2013 1450   HCT 35.9* 03/21/2014 0729   HCT 38.0* 07/07/2013 1450   PLT 203 03/21/2014 0729   PLT 178 07/07/2013 1450   MCV 89.1 03/21/2014 0729   MCV 91.5 07/07/2013 1450   MCH 29.3 03/21/2014 0729   MCH 29.3 07/07/2013 1450   MCHC 32.9 03/21/2014 0729   MCHC 32.1 07/07/2013 1450   RDW 14.6 03/21/2014 0729   RDW 15.3* 07/07/2013 1450   LYMPHSABS 0.5* 03/09/2014 1945   LYMPHSABS 2.0 07/07/2013 1450   MONOABS 0.9 03/09/2014 1945   MONOABS 0.9 07/07/2013 1450   EOSABS 0.0 03/09/2014 1945   EOSABS 0.1 07/07/2013 1450   BASOSABS 0.0 03/09/2014 1945   BASOSABS 0.0 07/07/2013 1450    BMET    Component Value Date/Time   NA 137 03/21/2014 0729   K 3.6 03/21/2014 0729   CL 105 03/21/2014 0729   CO2 23 03/21/2014 0729   GLUCOSE 96 03/21/2014 0729   BUN 35* 03/21/2014 0729   CREATININE 1.33 03/21/2014 0729   CALCIUM 8.9 03/21/2014 0729   GFRNONAA 46* 03/21/2014 0729   GFRAA 53* 03/21/2014 0729    INR    Component Value Date/Time   INR 1.93* 03/21/2014 0729   INR 3.1 02/15/2014 1148     Intake/Output Summary (Last 24 hours) at 03/22/14 0803 Last data filed at 03/21/14 2058  Gross per 24 hour    Intake    420 ml  Output    575 ml  Net   -155 ml     Assessment:  81 y.o. male is s/p:  Brachial ulnar and radial artery embolectomy left arm  1 Day Post-Op  Plan: -pt with palpable left radial and ulnar pulses this am -pt refused blood draws until speaking to Dr. Oneida Alar this am.   -hematoma at incision site-continue to monitor-if enlarges, may need to discontinue heparin for a short time. -pt continues to be in Afib & has a mechanical mitral valve-will need to start back coumadin today or tomorrow-will d/w Dr. Oneida Alar.  ?echo. (had echo on 03/12/14). -DVT prophylaxis:  Heparin gtt   Leontine Locket, PA-C [facility] [phone] 03/22/2014 8:03 AM   3 cm hematoma left arm no skin compromise Not tense 2+ radial ulnar with improved sensation in hand  Resume coumadin at home dose today No further cardiac workup PT/OT eval today Home or SNF when INR over [place], MD [facility] [place]: [phone] Pager: [phone]

## 2014-03-23 ENCOUNTER — Telehealth: Payer: Self-pay | Admitting: Vascular Surgery

## 2014-03-23 LAB — CBC
HCT: 34.1 % — ABNORMAL LOW (ref 39.0–52.0)
Hemoglobin: 11.2 g/dL — ABNORMAL LOW (ref 13.0–17.0)
MCH: 29.2 pg (ref 26.0–34.0)
MCHC: 32.8 g/dL (ref 30.0–36.0)
MCV: 89 fL (ref 78.0–100.0)
PLATELETS: 190 10*3/uL (ref 150–400)
RBC: 3.83 MIL/uL — AB (ref 4.22–5.81)
RDW: 14.6 % (ref 11.5–15.5)
WBC: 8.6 10*3/uL (ref 4.0–10.5)

## 2014-03-23 LAB — HEPARIN LEVEL (UNFRACTIONATED): HEPARIN UNFRACTIONATED: 1.02 [IU]/mL — AB (ref 0.30–0.70)

## 2014-03-23 LAB — PROTIME-INR
INR: 2.05 — ABNORMAL HIGH (ref 0.00–1.49)
PROTHROMBIN TIME: 23.3 s — AB (ref 11.6–15.2)

## 2014-03-23 MED ORDER — WARFARIN - PHYSICIAN DOSING INPATIENT
Freq: Every day | Status: DC
  Administered 2014-03-23: 18:00:00

## 2014-03-23 MED ORDER — TRAMADOL HCL 50 MG PO TABS: 50.0000 mg | ORAL_TABLET | Freq: Three times a day (TID) | ORAL | Status: DC | PRN

## 2014-03-23 NOTE — Clinical Social Work Note (Signed)
CSW spoke with patient, pt wife, and pt daughter at bedside.    Patient and family now agreeable to rehab but would like to see if [facility] has availability- CSW faxed to [facility]- no bed offer at this time. If [facility] is unable to admit, patient will go back to [place].  CSW will continue to follow.  Domenica Reamer, [place] Social Worker [phone]

## 2014-03-23 NOTE — Progress Notes (Signed)
[place] for heparin/warfarin  Indication: afib embolus of upper extremity   No Known Allergies  Patient Measurements: Height: 5\' 7"  (170.2 cm) Weight: 142 lb 8 oz (64.638 kg) IBW/kg (Calculated) : 66.1   Vital Signs: Temp: 97.3 F (36.3 C) (01/15 0509) Temp Source: Oral (01/15 0509) BP: 123/48 mmHg (01/15 0509) Pulse Rate: 60 (01/15 0509)  Labs:  Recent Labs  03/20/14 2130 03/21/14 0729 03/21/14 1135 03/22/14 1030 03/23/14 0544  HGB 12.5* 11.8*  --  12.4* 11.2*  HCT 37.0* 35.9*  --  37.4* 34.1*  PLT 202 203  --  205 190  LABPROT 21.7* 22.2*  --  23.6* 23.3*  INR 1.87* 1.93*  --  2.08* 2.05*  HEPARINUNFRC  --  0.33 0.52  --  1.02*  CREATININE 1.63* 1.33  --  1.40*  --     Estimated Creatinine Clearance: 33.3 mL/min (by C-G formula based on Cr of 1.4).   Medical History: Past Medical History  Diagnosis Date  . Hypertension   . Shortness of breath     "w/any activity lately" (02/03/2012)  . CHF (congestive heart failure)     mild/note 02/03/2012  . Arthritis     "left foot; fingers" (02/03/2012)  . ARF (acute renal failure)   . GIB (gastrointestinal bleeding)   . Chronic atrial fibrillation   . Non-sustained ventricular tachycardia   . Chronic anticoagulation   . Coronary artery disease 05/2000    s/p CABG with LIMA to LAD, SVG to IM, SVG to OM  . Mitral valvular regurgitation     s/p MVR with mechanical valve  . Ischemic dilated cardiomyopathy     EF 25-30% by echo 01/2012 at time of colon surgery  . Hyperlipidemia   . Stroke     after CABG  . Carcinoma of colon   . Basal cell carcinoma of shoulder 10/2010    Medications:  Prescriptions prior to admission  Medication Sig Dispense Refill Last Dose  . acetaminophen (TYLENOL) 325 MG tablet Take 2 tablets (650 mg total) by mouth every 6 (six) hours as needed for mild pain (or Fever >/= 101).   Past Month at Unknown time  . [EXPIRED] doxycycline (VIBRA-TABS) 100 MG  tablet Take 1 tablet (100 mg total) by mouth every 12 (twelve) hours.   03/20/2014 at Unknown time  . furosemide (LASIX) 40 MG tablet Take 1 tablet (40 mg total) by mouth 2 (two) times daily. 30 tablet  03/20/2014 at Unknown time  . hydrALAZINE (APRESOLINE) 25 MG tablet Take 25 mg by mouth 3 (three) times daily.    03/20/2014 at 5p  . metoprolol (LOPRESSOR) 50 MG tablet Take 100 mg by mouth 2 (two) times daily.   03/20/2014 at 5p  . Multiple Vitamin (MULTIVITAMIN WITH MINERALS) TABS Take 1 tablet by mouth daily.   03/20/2014 at Unknown time  . mupirocin cream (BACTROBAN) 2 % Apply topically daily. 15 g 0 03/20/2014 at Unknown time  . omega-3 acid ethyl esters (LOVAZA) 1 G capsule Take 1 g by mouth 3 (three) times daily.   03/20/2014 at Unknown time  . ondansetron (ZOFRAN) 4 MG tablet Take 1 tablet (4 mg total) by mouth every 6 (six) hours as needed for nausea. 20 tablet 0 03/19/2014 at Unknown time  . pantoprazole (PROTONIX) 40 MG tablet Take 40 mg by mouth daily.   03/19/2014 at Unknown time  . polyethylene glycol (MIRALAX / GLYCOLAX) packet Take 17 g by mouth daily as needed for  mild constipation. 14 each 0 03/19/2014 at Unknown time  . simvastatin (ZOCOR) 10 MG tablet Take 10 mg by mouth at bedtime.   03/19/2014 at Unknown time  . warfarin (COUMADIN) 2.5 MG tablet Take 2.5 mg by mouth See admin instructions. Patient takes Monday-Saturday   03/20/2014 at 5p  . warfarin (COUMADIN) 5 MG tablet Take 5 mg by mouth See admin instructions. Only on Sunday's   03/18/2014 at 5p    Assessment:  81 yo with mechanical mitral valve dc on 1/6 to SNF where there was evidence of rectal bleeding of unknown source. INR was therapeutic at 3.1 however MD reversed with 2.5 mg vit K and held coumadin due to "supratherapeutic inr" which was actually in range. Pt subsequently presented with pain numbness and pallor of left palmar side of forearm.   Patient started on IV heparin 1/13. INR continues to be >2 this am and heparin was  stopped. Per MD notes will continue home dose of coumadin, pharmacy will sign off and follow peripherally as do not see need for dose adjustments at this time.  3 cm hematoma left arm noted 1/14.   Goal of Therapy:  INR goal 2.5-3.5 Heparin level 0.3-0.7 units/ml Monitor platelets by anticoagulation protocol: Yes   Plan:  D/c heparin Daily HL/CBC Warfarin 2.5mg  tonight Pharmacy to sign off and follow peripherally  Erin Hearing PharmD., BCPS Clinical Pharmacist Pager [phone] 03/23/2014 9:40 AM

## 2014-03-23 NOTE — Progress Notes (Signed)
[facility] [place]  Subjective  - no complaints, left hand continues to improve   Objective 123/48 60 97.3 F (36.3 C) (Oral) 18 100%  Intake/Output Summary (Last 24 hours) at 03/23/14 0901 Last data filed at 03/23/14 0630  Gross per 24 hour  Intake    480 ml  Output   1650 ml  Net  -1170 ml   2+ radial and ulnar pulse Antecubital incision healing with ecchymosis and some bloody drainage, hematoma has diffused out some, skin still soft overall no pain   Assessment/Planning: Will d/c heparin since INR now > 2 Will plan d/c home tomorrow on his normal coumadin dose if hematoma not worse Will need INR recheck early next week as outpt if dc'd home Mobilize, walk   FIELDS,CHARLES E 03/23/2014 9:01 AM --  Laboratory Lab Results:  Recent Labs  03/22/14 1030 03/23/14 0544  WBC 8.7 8.6  HGB 12.4* 11.2*  HCT 37.4* 34.1*  PLT 205 190   BMET  Recent Labs  03/21/14 0729 03/22/14 1030  NA 137 139  K 3.6 4.3  CL 105 107  CO2 23 26  GLUCOSE 96 146*  BUN 35* 31*  CREATININE 1.33 1.40*  CALCIUM 8.9 9.4    COAG Lab Results  Component Value Date   INR 2.05* 03/23/2014   INR 2.08* 03/22/2014   INR 1.93* 03/21/2014   No results found for: PTT

## 2014-03-23 NOTE — Telephone Encounter (Signed)
No answer, no voicemail- mailed letter, dpm

## 2014-03-23 NOTE — Progress Notes (Signed)
Physical Therapy Treatment Patient Details Name: Alfred Dennis MRN: [id number] DOB: 08/15/1923 Today's Date: 03/23/2014    History of Present Illness 81 y.o. male s/p LEFT BRACHIAL EMBOLECTOMY.    PT Comments    Pt very pleasant but states right hip started hurting after sitting in recliner yesterday which is something that has happened to him for 40 years and that it usually goes away in 5-6 days. Pt currently unable to stand or bear weight on his RLE and unable to D/C home at this point due to significant change in mobility. Pt stating he would be agreeable to returning to [facility] or another SNF at this point because he knows dgtr could not care for him. Will continue to follow and encouraged HEP. Pt denied need for pain medicine. RN and CSW made aware of current status.  Follow Up Recommendations  SNF;Supervision/Assistance - 24 hour     Equipment Recommendations       Recommendations for Other Services       Precautions / Restrictions Precautions Precautions: Fall    Mobility  Bed Mobility               General bed mobility comments: pt EOB on arrival and denied return to bed  Transfers Overall transfer level: Needs assistance   Transfers: Sit to/from Stand Sit to Stand: Mod assist         General transfer comment: pt reports Right hip pain after sitting in recliner yesterday. Required mod assist x 3 trials with cues for sequence and safety to achieve standing. Pt unable to stand more than a few seconds and unable to ambulate today  Ambulation/Gait                 Stairs            Wheelchair Mobility    Modified Rankin (Stroke Patients Only)       Balance                                    Cognition Arousal/Alertness: Awake/alert Behavior During Therapy: Flat affect Overall Cognitive Status: No family/caregiver present to determine baseline cognitive functioning       Memory: Decreased short-term memory              Exercises General Exercises - Lower Extremity Long Arc Quad: Both;15 reps;Seated;AROM Hip Flexion/Marching: AROM;Both;15 reps;Seated    General Comments        Pertinent Vitals/Pain Pain Score: 6  Pain Location: right hip Pain Descriptors / Indicators: Aching Pain Intervention(s): Limited activity within patient's tolerance    Home Living                      Prior Function            PT Goals (current goals can now be found in the care plan section) Progress towards PT goals: Not progressing toward goals - comment    Frequency       PT Plan Discharge plan needs to be updated    Co-evaluation             End of Session   Activity Tolerance: Patient limited by pain Patient left: in bed;with call bell/phone within reach     Time: 4627-0350 PT Time Calculation (min) (ACUTE ONLY): 12 min  Charges:  $Therapeutic Activity: 8-22 mins  G CodesMelford Aase 2014/04/15, 1:23 PM Elwyn Reach, [place]

## 2014-03-23 NOTE — Discharge Instructions (Signed)

## 2014-03-23 NOTE — Clinical Social Work Note (Signed)
CSW received phone call stating that patient no longer thinks he is capable of going home and is agreeable to returning to [facility] faxed clinicals to [facility]- they will be able to readmit patient at time of DC.  CSW will continue to follow.  Domenica Reamer, [place] Social Worker [phone]

## 2014-03-23 NOTE — Telephone Encounter (Signed)
-----   Message from Mena Goes, RN sent at 03/23/2014 10:54 AM EST ----- Regarding: Schedule   ----- Message -----    From: Gabriel Earing, PA-C    Sent: 03/23/2014  10:15 AM      To: [facility] Charge Pool  S/p Brachial ulnar and radial artery embolectomy left arm 03/21/14.  F/u with Dr. Oneida Alar in 2 weeks.  Thanks

## 2014-03-24 DIAGNOSIS — D62 Acute posthemorrhagic anemia: Secondary | ICD-10-CM | POA: Diagnosis not present

## 2014-03-24 DIAGNOSIS — K219 Gastro-esophageal reflux disease without esophagitis: Secondary | ICD-10-CM | POA: Diagnosis not present

## 2014-03-24 DIAGNOSIS — I420 Dilated cardiomyopathy: Secondary | ICD-10-CM | POA: Diagnosis not present

## 2014-03-24 DIAGNOSIS — R278 Other lack of coordination: Secondary | ICD-10-CM | POA: Diagnosis not present

## 2014-03-24 DIAGNOSIS — R202 Paresthesia of skin: Secondary | ICD-10-CM | POA: Diagnosis not present

## 2014-03-24 DIAGNOSIS — I5022 Chronic systolic (congestive) heart failure: Secondary | ICD-10-CM | POA: Diagnosis not present

## 2014-03-24 DIAGNOSIS — M79602 Pain in left arm: Secondary | ICD-10-CM | POA: Diagnosis not present

## 2014-03-24 DIAGNOSIS — R29810 Facial weakness: Secondary | ICD-10-CM | POA: Diagnosis not present

## 2014-03-24 DIAGNOSIS — R531 Weakness: Secondary | ICD-10-CM | POA: Diagnosis not present

## 2014-03-24 DIAGNOSIS — E785 Hyperlipidemia, unspecified: Secondary | ICD-10-CM | POA: Diagnosis not present

## 2014-03-24 DIAGNOSIS — R5381 Other malaise: Secondary | ICD-10-CM | POA: Diagnosis not present

## 2014-03-24 DIAGNOSIS — I10 Essential (primary) hypertension: Secondary | ICD-10-CM | POA: Diagnosis not present

## 2014-03-24 DIAGNOSIS — I059 Rheumatic mitral valve disease, unspecified: Secondary | ICD-10-CM | POA: Diagnosis not present

## 2014-03-24 DIAGNOSIS — M79671 Pain in right foot: Secondary | ICD-10-CM | POA: Diagnosis not present

## 2014-03-24 DIAGNOSIS — I5043 Acute on chronic combined systolic (congestive) and diastolic (congestive) heart failure: Secondary | ICD-10-CM | POA: Diagnosis not present

## 2014-03-24 DIAGNOSIS — I482 Chronic atrial fibrillation: Secondary | ICD-10-CM | POA: Diagnosis not present

## 2014-03-24 DIAGNOSIS — M25669 Stiffness of unspecified knee, not elsewhere classified: Secondary | ICD-10-CM | POA: Diagnosis not present

## 2014-03-24 DIAGNOSIS — Z951 Presence of aortocoronary bypass graft: Secondary | ICD-10-CM | POA: Diagnosis not present

## 2014-03-24 DIAGNOSIS — I472 Ventricular tachycardia: Secondary | ICD-10-CM | POA: Diagnosis not present

## 2014-03-24 DIAGNOSIS — I255 Ischemic cardiomyopathy: Secondary | ICD-10-CM | POA: Diagnosis not present

## 2014-03-24 DIAGNOSIS — Z954 Presence of other heart-valve replacement: Secondary | ICD-10-CM | POA: Diagnosis not present

## 2014-03-24 DIAGNOSIS — L03115 Cellulitis of right lower limb: Secondary | ICD-10-CM | POA: Diagnosis not present

## 2014-03-24 DIAGNOSIS — A419 Sepsis, unspecified organism: Secondary | ICD-10-CM | POA: Diagnosis not present

## 2014-03-24 DIAGNOSIS — M79672 Pain in left foot: Secondary | ICD-10-CM | POA: Diagnosis not present

## 2014-03-24 DIAGNOSIS — R2681 Unsteadiness on feet: Secondary | ICD-10-CM | POA: Diagnosis not present

## 2014-03-24 DIAGNOSIS — I742 Embolism and thrombosis of arteries of the upper extremities: Secondary | ICD-10-CM | POA: Diagnosis not present

## 2014-03-24 DIAGNOSIS — Z48812 Encounter for surgical aftercare following surgery on the circulatory system: Secondary | ICD-10-CM | POA: Diagnosis not present

## 2014-03-24 DIAGNOSIS — L97529 Non-pressure chronic ulcer of other part of left foot with unspecified severity: Secondary | ICD-10-CM | POA: Diagnosis not present

## 2014-03-24 DIAGNOSIS — K5900 Constipation, unspecified: Secondary | ICD-10-CM | POA: Diagnosis not present

## 2014-03-24 DIAGNOSIS — N183 Chronic kidney disease, stage 3 (moderate): Secondary | ICD-10-CM | POA: Diagnosis not present

## 2014-03-24 LAB — BASIC METABOLIC PANEL
ANION GAP: 11 (ref 5–15)
BUN: 38 mg/dL — AB (ref 6–23)
CO2: 26 mmol/L (ref 19–32)
CREATININE: 1.39 mg/dL — AB (ref 0.50–1.35)
Calcium: 9.5 mg/dL (ref 8.4–10.5)
Chloride: 100 mEq/L (ref 96–112)
GFR calc Af Amer: 51 mL/min — ABNORMAL LOW (ref >90)
GFR calc non Af Amer: 44 mL/min — ABNORMAL LOW (ref >90)
Glucose, Bld: 119 mg/dL — ABNORMAL HIGH (ref 70–99)
Potassium: 3.9 mmol/L (ref 3.5–5.1)
Sodium: 137 mmol/L (ref 135–145)

## 2014-03-24 LAB — CBC
HEMATOCRIT: 33.4 % — AB (ref 39.0–52.0)
Hemoglobin: 11.1 g/dL — ABNORMAL LOW (ref 13.0–17.0)
MCH: 29.4 pg (ref 26.0–34.0)
MCHC: 33.2 g/dL (ref 30.0–36.0)
MCV: 88.4 fL (ref 78.0–100.0)
Platelets: 203 10*3/uL (ref 150–400)
RBC: 3.78 MIL/uL — ABNORMAL LOW (ref 4.22–5.81)
RDW: 14.6 % (ref 11.5–15.5)
WBC: 11.7 10*3/uL — ABNORMAL HIGH (ref 4.0–10.5)

## 2014-03-24 LAB — PROTIME-INR
INR: 2.17 — ABNORMAL HIGH (ref 0.00–1.49)
Prothrombin Time: 24.3 seconds — ABNORMAL HIGH (ref 11.6–15.2)

## 2014-03-24 NOTE — Clinical Social Work Note (Signed)
Clinical Social Worker facilitated patient discharge including contacting patient family and facility to confirm patient discharge plans.  Clinical information faxed to facility and family agreeable with plan.  CSW arranged ambulance transport via [facility] to [facility].  RN to call report prior to discharge.  DC packet prepared for transport and on chart.   Clinical Social Worker will sign off for now as social work intervention is no longer needed. Please consult [place] again if new need arises.  Glendon Axe, MSW, LCSWA [phone] 03/24/2014 11:58 AM

## 2014-03-24 NOTE — Progress Notes (Addendum)
  [facility] Progress Note  03/24/2014 7:23 AM 3 Days Post-Op  Subjective:  No complaints. Denies any left arm or hand pain. Wants to go home.   Tmax 98.3 BP sys 100s-120s 02 98% RA  Filed Vitals:   03/24/14 0529  BP: 125/86  Pulse: 79  Temp: 97.6 F (36.4 C)  Resp: 16    Physical Exam: Incisions:  Left antecubital incision without drainage. Surrounding ecchymosis and hematoma. Arm is soft. No tenderness with palpation.  Extremities:  2+ left radial pulse and ulnar pulse  CBC    Component Value Date/Time   WBC 11.7* 03/24/2014 0448   WBC 9.3 07/07/2013 1450   RBC 3.78* 03/24/2014 0448   RBC 4.16* 07/07/2013 1450   HGB 11.1* 03/24/2014 0448   HGB 12.2* 07/07/2013 1450   HCT 33.4* 03/24/2014 0448   HCT 38.0* 07/07/2013 1450   PLT 203 03/24/2014 0448   PLT 178 07/07/2013 1450   MCV 88.4 03/24/2014 0448   MCV 91.5 07/07/2013 1450   MCH 29.4 03/24/2014 0448   MCH 29.3 07/07/2013 1450   MCHC 33.2 03/24/2014 0448   MCHC 32.1 07/07/2013 1450   RDW 14.6 03/24/2014 0448   RDW 15.3* 07/07/2013 1450   LYMPHSABS 0.5* 03/09/2014 1945   LYMPHSABS 2.0 07/07/2013 1450   MONOABS 0.9 03/09/2014 1945   MONOABS 0.9 07/07/2013 1450   EOSABS 0.0 03/09/2014 1945   EOSABS 0.1 07/07/2013 1450   BASOSABS 0.0 03/09/2014 1945   BASOSABS 0.0 07/07/2013 1450    BMET    Component Value Date/Time   NA 137 03/24/2014 0448   K 3.9 03/24/2014 0448   CL 100 03/24/2014 0448   CO2 26 03/24/2014 0448   GLUCOSE 119* 03/24/2014 0448   BUN 38* 03/24/2014 0448   CREATININE 1.39* 03/24/2014 0448   CALCIUM 9.5 03/24/2014 0448   GFRNONAA 44* 03/24/2014 0448   GFRAA 51* 03/24/2014 0448    INR    Component Value Date/Time   INR 2.17* 03/24/2014 0448   INR 3.1 02/15/2014 1148     Intake/Output Summary (Last 24 hours) at 03/24/14 0723 Last data filed at 03/24/14 0530  Gross per 24 hour  Intake    240 ml  Output   1675 ml  Net  -1435 ml     Assessment:  81 y.o.  male is s/p: brachial ulnar and radial embolectomy left arm  3 Days Post-Op  Plan: -Hematoma stable. Skin is soft with palpable radial and ulnar pulses. -Patient wanting to go to [facility] but bed was not available as of yesterday. Will wait to here back from [place]. -Otherwise, patient wants to go home. He does not want to go to [facility].  -Discharge today to SNF vs home with home health.    Virgina Jock, PA-C [facility] Office: [phone] Pager: [phone] 03/24/2014 7:23 AM   Palpable left radial pulse. Hematoma stable. Possibly to SNF today. His daughter wants to know what the plan is for continued PTx and wants BP checked routinely. She says that she is unable to check BP  Deitra Mayo, MD, [place] [phone] 03/24/2014

## 2014-03-24 NOTE — Discharge Summary (Signed)
[facility] Discharge Summary  Alfred Dennis 07-22-1923 81 y.o. male  [id number]  Admission Date: 03/20/2014  Discharge Date: 03/24/2014  Physician: Elam Dutch, MD  Admission Diagnosis: left arm numbness  Cold Hand   HPI:   This is a 81 y.o. male with 12 hour history of acute onset of pain, numbness and decreased strength to left hand. The pain, numbness and weakness are resolving but not quite back to baseline. He has chronic afib on coumadin with INR of 1.8. He currently is at [facility] after recent cellulitis of right foot.   Hospital Course:  The patient was admitted to the hospital and placed on IV heparin. His coumadin was held and he had an arterial duplex performed. He was taken to the operating room on 03/21/14 and underwent brachial, ulnar and radial artery embolectomy of the left arm.     The patient tolerated the procedure well and was transported to the PACU in good condition.   On POD 1, he had a hematoma at his left antecubital incision. He had a 2+ radial and ulnar pulse with improved sensation in his hand. He was restarted on his home dose coumadin.   His heparin was discontinued on POD 2. His INR was now >2. His hematoma had diffused out some. On POD 3, his hematoma remained stable and continued to complain of no pain of his left arm or hand. He had palpable radial and ulnar pulses. His INR was therapeutic. The patient wanted to go to a short term SNF for increased therapy and mobilization. CSW was consulted for SNF placement. He was discharged to SNF on POD 3 in good condition. His family was instructed to have his INR checked early next week.    CBC    Component Value Date/Time   WBC 11.7* 03/24/2014 0448   WBC 9.3 07/07/2013 1450   RBC 3.78* 03/24/2014 0448   RBC 4.16* 07/07/2013 1450   HGB 11.1* 03/24/2014 0448   HGB 12.2* 07/07/2013 1450   HCT 33.4* 03/24/2014 0448   HCT 38.0* 07/07/2013 1450   PLT 203 03/24/2014  0448   PLT 178 07/07/2013 1450   MCV 88.4 03/24/2014 0448   MCV 91.5 07/07/2013 1450   MCH 29.4 03/24/2014 0448   MCH 29.3 07/07/2013 1450   MCHC 33.2 03/24/2014 0448   MCHC 32.1 07/07/2013 1450   RDW 14.6 03/24/2014 0448   RDW 15.3* 07/07/2013 1450   LYMPHSABS 0.5* 03/09/2014 1945   LYMPHSABS 2.0 07/07/2013 1450   MONOABS 0.9 03/09/2014 1945   MONOABS 0.9 07/07/2013 1450   EOSABS 0.0 03/09/2014 1945   EOSABS 0.1 07/07/2013 1450   BASOSABS 0.0 03/09/2014 1945   BASOSABS 0.0 07/07/2013 1450    BMET    Component Value Date/Time   NA 137 03/24/2014 0448   K 3.9 03/24/2014 0448   CL 100 03/24/2014 0448   CO2 26 03/24/2014 0448   GLUCOSE 119* 03/24/2014 0448   BUN 38* 03/24/2014 0448   CREATININE 1.39* 03/24/2014 0448   CALCIUM 9.5 03/24/2014 0448   GFRNONAA 44* 03/24/2014 0448   GFRAA 51* 03/24/2014 0448     Discharge Instructions:   The patient is discharged to SNF with extensive instructions on wound care and progressive ambulation.  They are instructed not to drive or perform any heavy lifting until returning to see the physician in his office.  Discharge Instructions    Call MD for:  redness, tenderness, or signs of infection (pain, swelling,  bleeding, redness, odor or green/yellow discharge around incision site)    Complete by:  As directed      Call MD for:  severe or increased pain, loss or decreased feeling  in affected limb(s)    Complete by:  As directed      Call MD for:  temperature >100.5    Complete by:  As directed      Discharge wound care:    Complete by:  As directed   Shower daily with soap and water starting 03/25/14     Driving Restrictions    Complete by:  As directed   No driving for 2 weeks     Lifting restrictions    Complete by:  As directed   No lifting for 4 weeks     Resume previous diet    Complete by:  As directed            Discharge Diagnosis:  left arm numbness  Cold Hand  Secondary Diagnosis: Patient Active Problem List     Diagnosis Date Noted  . Ischemia of hand 03/21/2014  . NSVT (nonsustained ventricular tachycardia) 03/12/2014  . Cellulitis of right lower extremity   . Acute on chronic systolic and diastolic heart failure, NYHA class 2 03/10/2014  . Cellulitis 03/09/2014  . Sepsis due to cellulitis 03/09/2014  . Chronic systolic heart failure 16/12/9602  . Chronic renal disease, stage III 03/09/2014  . Ischemic dilated cardiomyopathy-EF 25-30% 2013 10/04/2013  . Encounter for therapeutic drug monitoring 07/10/2013  . S/P CABG x 3 2002 12/26/2012  . Dyslipidemia 12/26/2012  . Mitral valve disorders 12/13/2012  . Heart valve replaced by other means 12/13/2012  . Carcinoma of colon 02/08/2012  . Benign neoplasm of colon 02/08/2012  . Gastritis 02/08/2012  . Colonic mass 02/08/2012  . Iron deficiency anemia 02/05/2012  . Chronic a-fib 02/03/2012  . HTN (hypertension) 02/03/2012  . S/P MVR (mitral valve replacement)-St Jude 2002 02/03/2012  . GIB (gastrointestinal bleeding) 02/03/2012   Past Medical History  Diagnosis Date  . Hypertension   . Shortness of breath     "w/any activity lately" (02/03/2012)  . CHF (congestive heart failure)     mild/note 02/03/2012  . Arthritis     "left foot; fingers" (02/03/2012)  . ARF (acute renal failure)   . GIB (gastrointestinal bleeding)   . Chronic atrial fibrillation   . Non-sustained ventricular tachycardia   . Chronic anticoagulation   . Coronary artery disease 05/2000    s/p CABG with LIMA to LAD, SVG to IM, SVG to OM  . Mitral valvular regurgitation     s/p MVR with mechanical valve  . Ischemic dilated cardiomyopathy     EF 25-30% by echo 01/2012 at time of colon surgery  . Hyperlipidemia   . Stroke     after CABG  . Carcinoma of colon   . Basal cell carcinoma of shoulder 10/2010       Medication List    STOP taking these medications        doxycycline 100 MG tablet  Commonly known as:  VIBRA-TABS      TAKE these medications         acetaminophen 325 MG tablet  Commonly known as:  TYLENOL  Take 2 tablets (650 mg total) by mouth every 6 (six) hours as needed for mild pain (or Fever >/= 101).     furosemide 40 MG tablet  Commonly known as:  LASIX  Take 1 tablet (40 mg total)  by mouth 2 (two) times daily.     hydrALAZINE 25 MG tablet  Commonly known as:  APRESOLINE  Take 25 mg by mouth 3 (three) times daily.     metoprolol 50 MG tablet  Commonly known as:  LOPRESSOR  Take 100 mg by mouth 2 (two) times daily.     multivitamin with minerals Tabs tablet  Take 1 tablet by mouth daily.     mupirocin cream 2 %  Commonly known as:  BACTROBAN  Apply topically daily.     omega-3 acid ethyl esters 1 G capsule  Commonly known as:  LOVAZA  Take 1 g by mouth 3 (three) times daily.     ondansetron 4 MG tablet  Commonly known as:  ZOFRAN  Take 1 tablet (4 mg total) by mouth every 6 (six) hours as needed for nausea.     pantoprazole 40 MG tablet  Commonly known as:  PROTONIX  Take 40 mg by mouth daily.     polyethylene glycol packet  Commonly known as:  MIRALAX / GLYCOLAX  Take 17 g by mouth daily as needed for mild constipation.     simvastatin 10 MG tablet  Commonly known as:  ZOCOR  Take 10 mg by mouth at bedtime.     traMADol 50 MG tablet  Commonly known as:  ULTRAM  Take 1 tablet (50 mg total) by mouth every 8 (eight) hours as needed.     warfarin 2.5 MG tablet  Commonly known as:  COUMADIN  Take 2.5 mg by mouth See admin instructions. Patient takes Monday-Saturday     warfarin 5 MG tablet  Commonly known as:  COUMADIN  Take 5 mg by mouth See admin instructions. Only on Sunday's        Disposition: SNF  Patient's condition: is Good  Follow up: 1. Dr. Oneida Alar in 2 weeks   Virgina Jock, PA-C [facility] [phone] 03/24/2014  11:25 AM

## 2014-03-24 NOTE — Progress Notes (Signed)
Report given to [place] at [facility]. Pt awaiting [facility] transport. Family at bedside updated.

## 2014-03-26 ENCOUNTER — Non-Acute Institutional Stay (SKILLED_NURSING_FACILITY): Payer: Medicare Other | Admitting: Internal Medicine

## 2014-03-26 ENCOUNTER — Encounter (HOSPITAL_COMMUNITY): Payer: Self-pay | Admitting: Vascular Surgery

## 2014-03-26 DIAGNOSIS — I255 Ischemic cardiomyopathy: Secondary | ICD-10-CM | POA: Diagnosis not present

## 2014-03-26 DIAGNOSIS — N183 Chronic kidney disease, stage 3 unspecified: Secondary | ICD-10-CM

## 2014-03-26 DIAGNOSIS — E785 Hyperlipidemia, unspecified: Secondary | ICD-10-CM

## 2014-03-26 DIAGNOSIS — K219 Gastro-esophageal reflux disease without esophagitis: Secondary | ICD-10-CM

## 2014-03-26 DIAGNOSIS — R5381 Other malaise: Secondary | ICD-10-CM | POA: Diagnosis not present

## 2014-03-26 DIAGNOSIS — I420 Dilated cardiomyopathy: Secondary | ICD-10-CM

## 2014-03-26 DIAGNOSIS — I742 Embolism and thrombosis of arteries of the upper extremities: Secondary | ICD-10-CM | POA: Diagnosis not present

## 2014-03-26 DIAGNOSIS — D62 Acute posthemorrhagic anemia: Secondary | ICD-10-CM

## 2014-03-26 DIAGNOSIS — I482 Chronic atrial fibrillation, unspecified: Secondary | ICD-10-CM

## 2014-03-26 DIAGNOSIS — I10 Essential (primary) hypertension: Secondary | ICD-10-CM

## 2014-03-26 NOTE — Progress Notes (Signed)
Patient ID: Alfred Dennis, male   DOB: 1923/09/04, 81 y.o.   MRN: [id number]     [facility]   PCP: Osborne Casco, MD  Code Status: DNR  No Known Allergies  Chief Complaint  Patient presents with  . New Admit To SNF     HPI:  81 year old patient is here for short term rehabilitation post hospital admission from 03/20/14-03/24/14 acute onset pain, numbness and decreased strength to left arm. He was noted to have subtherapeutic inr and ischemia of left arm. He had arterial duplex performed showing acute embolus in brachial bifurcation and distal radial and ulnar arteries. He was taken to the operating room on 03/21/14 and underwent brachial, ulnar and radial artery embolectomy of the left arm.  He is seen in his room today. He denies any concerns. He denies pain in left hand. His sensation is normal. He feels tired easily but is looking forward to working with therapy team today. He has PMH of afib, HTN, ischemic CMP among others.     Review of Systems:  Constitutional: Negative for fever, chills, diaphoresis.  HENT: Negative for headache, congestion, nasal discharge Eyes: Negative for eye pain, blurred vision, double vision and discharge.  Respiratory: Negative for cough, shortness of breath and wheezing.   Cardiovascular: Negative for chest pain, palpitations, leg swelling.  Gastrointestinal: Negative for heartburn, nausea, vomiting, abdominal pain Genitourinary: Negative for dysuria.  Musculoskeletal: Negative for back pain, falls Skin: Negative for itching, rash.  Neurological: Negative for dizziness, tingling, focal weakness Psychiatric/Behavioral: Negative for depression.    Past Medical History  Diagnosis Date  . Hypertension   . Shortness of breath     "w/any activity lately" (02/03/2012)  . CHF (congestive heart failure)     mild/note 02/03/2012  . Arthritis     "left foot; fingers" (02/03/2012)  . ARF (acute renal  failure)   . GIB (gastrointestinal bleeding)   . Chronic atrial fibrillation   . Non-sustained ventricular tachycardia   . Chronic anticoagulation   . Coronary artery disease 05/2000    s/p CABG with LIMA to LAD, SVG to IM, SVG to OM  . Mitral valvular regurgitation     s/p MVR with mechanical valve  . Ischemic dilated cardiomyopathy     EF 25-30% by echo 01/2012 at time of colon surgery  . Hyperlipidemia   . Stroke     after CABG  . Carcinoma of colon   . Basal cell carcinoma of shoulder 10/2010   Past Surgical History  Procedure Laterality Date  . Inguinal hernia repair      right  . Cataract extraction w/ intraocular lens  implant, bilateral  ~ 2011  . Valve replacement  05/2000    St. Jude 64mm mechanical valve  . Cardiac valve replacement  05/2000    St. Jude 49mm  . Esophagogastroduodenoscopy  02/08/2012    Procedure: ESOPHAGOGASTRODUODENOSCOPY (EGD);  Surgeon: Gatha Mayer, MD;  Location: [facility] ENDOSCOPY;  Service: Endoscopy;  Laterality: N/A;  . Colonoscopy  02/08/2012    Procedure: COLONOSCOPY;  Surgeon: Gatha Mayer, MD;  Location: [place];  Service: Endoscopy;  Laterality: N/A;  . Laparoscopic partial colectomy  02/10/2012    Procedure: LAPAROSCOPIC PARTIAL COLECTOMY;  Surgeon: Stark Klein, MD;  Location: [place];  Service: General;  Laterality: N/A;  . Coronary artery bypass graft      LIMA to LAD, SVG to IM, SVG to OM2  . Embolectomy Left 03/21/2014  Procedure: LEFT BRACHIAL EMBOLECTOMY;  Surgeon: Elam Dutch, MD;  Location: [place];  Service: Vascular;  Laterality: Left;   Social History:   reports that he quit smoking about 34 years ago. His smoking use included Cigarettes. He has a 20 pack-year smoking history. He has never used smokeless tobacco. He reports that he does not drink alcohol or use illicit drugs.  Family History  Problem Relation Age of Onset  . Heart disease Mother     Medications: Patient's Medications  New Prescriptions   No  medications on file  Previous Medications   ACETAMINOPHEN (TYLENOL) 325 MG TABLET    Take 2 tablets (650 mg total) by mouth every 6 (six) hours as needed for mild pain (or Fever >/= 101).   FUROSEMIDE (LASIX) 40 MG TABLET    Take 1 tablet (40 mg total) by mouth 2 (two) times daily.   HYDRALAZINE (APRESOLINE) 25 MG TABLET    Take 25 mg by mouth 3 (three) times daily.    METOPROLOL (LOPRESSOR) 50 MG TABLET    Take 100 mg by mouth 2 (two) times daily.   MULTIPLE VITAMIN (MULTIVITAMIN WITH MINERALS) TABS    Take 1 tablet by mouth daily.   MUPIROCIN CREAM (BACTROBAN) 2 %    Apply topically daily.   OMEGA-3 ACID ETHYL ESTERS (LOVAZA) 1 G CAPSULE    Take 1 g by mouth 3 (three) times daily.   ONDANSETRON (ZOFRAN) 4 MG TABLET    Take 1 tablet (4 mg total) by mouth every 6 (six) hours as needed for nausea.   PANTOPRAZOLE (PROTONIX) 40 MG TABLET    Take 40 mg by mouth daily.   POLYETHYLENE GLYCOL (MIRALAX / GLYCOLAX) PACKET    Take 17 g by mouth daily as needed for mild constipation.   SIMVASTATIN (ZOCOR) 10 MG TABLET    Take 10 mg by mouth at bedtime.   TRAMADOL (ULTRAM) 50 MG TABLET    Take 1 tablet (50 mg total) by mouth every 8 (eight) hours as needed.   WARFARIN (COUMADIN) 2.5 MG TABLET    Take 2.5 mg by mouth See admin instructions. Patient takes Monday-Saturday   WARFARIN (COUMADIN) 5 MG TABLET    Take 5 mg by mouth See admin instructions. Only on Sunday's  Modified Medications   No medications on file  Discontinued Medications   No medications on file     Physical Exam: Filed Vitals:   03/26/14 1250  BP: 102/42  Pulse: 73  Temp: 98.9 F (37.2 C)  Resp: 18  SpO2: 99%   General- elderly male, thin built, in no acute distress Head- normocephalic, atraumatic Throat- moist mucus membrane Eyes- PERRLA, EOMI, no pallor, no icterus, no discharge, normal conjunctiva, normal sclera Neck- no cervical lymphadenopathy Cardiovascular- irregular heart rate, no murmurs, weak dorsalis pedis and  good radial pulses, 1+ left arm edema and trace leg edema Respiratory- bilateral clear to auscultation, no wheeze, no rhonchi, no crackles, no use of accessory muscles Abdomen- bowel sounds present, soft, non tender Musculoskeletal- able to move all 4 extremities, generalized weakness Neurological- no focal deficit Skin- warm and dry, left arm ecchymoses, left arm surgical incision healing well, sutures in place and no signs of infection Psychiatry- alert and oriented    Labs reviewed: Basic Metabolic Panel:  Recent Labs  03/11/14 0600  03/13/14 0323 03/14/14 0315  03/21/14 0729 03/22/14 1030 03/24/14 0448  NA  --   < > 137 140  < > 137 139 137  K  --   < >  4.5 3.9  < > 3.6 4.3 3.9  CL  --   < > 98 101  < > 105 107 100  CO2  --   < > 28 30  < > 23 26 26   GLUCOSE  --   < > 97 107*  < > 96 146* 119*  BUN  --   < > 38* 44*  < > 35* 31* 38*  CREATININE  --   < > 1.13 1.61*  < > 1.33 1.40* 1.39*  CALCIUM  --   < > 9.0 8.9  < > 8.9 9.4 9.5  MG 1.9  --  2.1 2.0  --   --   --   --   < > = values in this interval not displayed. Liver Function Tests:  Recent Labs  03/09/14 1945 03/10/14 0328 03/22/14 1030  AST 111* 82* 91*  ALT 56* 52 31  ALKPHOS 92 79 70  BILITOT 1.1 1.5* 0.8  PROT 6.8 5.8* 6.5  ALBUMIN 3.8 3.2* 3.4*   No results for input(s): LIPASE, AMYLASE in the last 8760 hours. No results for input(s): AMMONIA in the last 8760 hours. CBC:  Recent Labs  07/07/13 1450 03/09/14 1945  03/22/14 1030 03/23/14 0544 03/24/14 0448  WBC 9.3 14.4*  < > 8.7 8.6 11.7*  NEUTROABS 6.3 13.0*  --   --   --   --   HGB 12.2* 12.3*  < > 12.4* 11.2* 11.1*  HCT 38.0* 37.7*  < > 37.4* 34.1* 33.4*  MCV 91.5 92.2  < > 91.0 89.0 88.4  PLT 178 145*  < > 205 190 203  < > = values in this interval not displayed. Cardiac Enzymes:  Recent Labs  03/09/14 1945  TROPONINI 0.05*    Assessment/Plan  Left arm arterial thrombus S/p embolectomy. Surgical incision healing well, good  sensation and pulses on exam. Will have him work with physical therapy and occupational therapy team to help with gait training and muscle strengthening exercises.fall precautions. Skin care. Encourage to be out of bed. Has f/u with dr fields  Physical deconditioning Will have him work with physical therapy and occupational therapy team to help with gait training and muscle strengthening exercises.fall precautions. Skin care. Encourage to be out of bed.   afib Rate controlled, continue lopressor 100 mg bid, on coumadin, check inr- goal 2-3  Ischemic cardiomyopathy Appears euvolemic on exam. No symptoms. Monitor daily weight, continue lasix 40 mg bid, hydralazine 25 mg tid and lopressor 100 mg bid. Monitor bmp  Acute blood loss anemia Post op and has hx of gi bleed, check h&h  CKD stage 3 Monitor renal function with pt on lasix and with hx of HTN  Hyperlipidemia Continue zocor 10 mg daily and omega 3  Constipation   Avoid straining, continue miralax prn for now   Goals of care: short term rehabilitation    Labs/tests ordered- cbc with diff, cmp   Family/ staff Communication: reviewed care plan with patient and nursing supervisor    Blanchie Serve, MD  [facility] [phone] (Monday-Friday 8 am - 5 pm) [phone] (afterhours)

## 2014-03-28 ENCOUNTER — Telehealth: Payer: Self-pay | Admitting: Cardiology

## 2014-03-28 NOTE — Telephone Encounter (Signed)
New Message      Patients daugher needs to know if the appt on Monday 04/02/14 @ 230pm  Needs to be kept. She is unsure if this appt needs to be kept because all of his vitals have been checked periodically.  Please call to let patient know.

## 2014-03-28 NOTE — Telephone Encounter (Signed)
Left message for Alfred Dennis that they do need to keep OV for Monday, that his 6 month check-up is due. Instructed her to call the office with any questions.

## 2014-03-29 DIAGNOSIS — M79671 Pain in right foot: Secondary | ICD-10-CM | POA: Diagnosis not present

## 2014-03-29 DIAGNOSIS — L97529 Non-pressure chronic ulcer of other part of left foot with unspecified severity: Secondary | ICD-10-CM | POA: Diagnosis not present

## 2014-03-29 DIAGNOSIS — M79672 Pain in left foot: Secondary | ICD-10-CM | POA: Diagnosis not present

## 2014-04-02 ENCOUNTER — Encounter: Payer: Self-pay | Admitting: Cardiology

## 2014-04-02 ENCOUNTER — Ambulatory Visit (INDEPENDENT_AMBULATORY_CARE_PROVIDER_SITE_OTHER): Payer: Medicare Other | Admitting: Cardiology

## 2014-04-02 ENCOUNTER — Ambulatory Visit: Payer: Medicare Other | Admitting: Cardiology

## 2014-04-02 VITALS — BP 110/60 | HR 49 | Ht 67.0 in | Wt 138.8 lb

## 2014-04-02 DIAGNOSIS — I482 Chronic atrial fibrillation, unspecified: Secondary | ICD-10-CM

## 2014-04-02 DIAGNOSIS — I255 Ischemic cardiomyopathy: Secondary | ICD-10-CM

## 2014-04-02 DIAGNOSIS — Z951 Presence of aortocoronary bypass graft: Secondary | ICD-10-CM | POA: Diagnosis not present

## 2014-04-02 DIAGNOSIS — E785 Hyperlipidemia, unspecified: Secondary | ICD-10-CM

## 2014-04-02 DIAGNOSIS — I059 Rheumatic mitral valve disease, unspecified: Secondary | ICD-10-CM

## 2014-04-02 DIAGNOSIS — I10 Essential (primary) hypertension: Secondary | ICD-10-CM | POA: Diagnosis not present

## 2014-04-02 DIAGNOSIS — Z952 Presence of prosthetic heart valve: Secondary | ICD-10-CM

## 2014-04-02 DIAGNOSIS — I472 Ventricular tachycardia: Secondary | ICD-10-CM | POA: Diagnosis not present

## 2014-04-02 DIAGNOSIS — Z954 Presence of other heart-valve replacement: Secondary | ICD-10-CM | POA: Diagnosis not present

## 2014-04-02 DIAGNOSIS — I5022 Chronic systolic (congestive) heart failure: Secondary | ICD-10-CM | POA: Diagnosis not present

## 2014-04-02 DIAGNOSIS — I420 Dilated cardiomyopathy: Secondary | ICD-10-CM

## 2014-04-02 DIAGNOSIS — I4729 Other ventricular tachycardia: Secondary | ICD-10-CM

## 2014-04-02 LAB — BASIC METABOLIC PANEL
BUN: 38 mg/dL — ABNORMAL HIGH (ref 6–23)
CO2: 29 meq/L (ref 19–32)
Calcium: 9.4 mg/dL (ref 8.4–10.5)
Chloride: 102 mEq/L (ref 96–112)
Creatinine, Ser: 1.43 mg/dL (ref 0.40–1.50)
GFR: 49.59 mL/min — AB (ref >60.00)
Glucose, Bld: 110 mg/dL — ABNORMAL HIGH (ref 70–99)
POTASSIUM: 3.4 meq/L — AB (ref 3.5–5.1)
SODIUM: 139 meq/L (ref 135–145)

## 2014-04-02 NOTE — Progress Notes (Signed)
Cardiology Office Note   Date:  04/02/2014   ID:  Alfred Dennis, DOB Jul 05, 1923, MRN [id number]  PCP:  Alfred Casco, MD  Cardiologist:   Sueanne Margarita, MD   Chief Complaint  Patient presents with  . Follow-up      History of Present Illness: Alfred Dennis is a 81 y.o. male with a history of severe MR s/p MVR, HTN, dyslipidemia, chronic atrial fibrillation, CAD s/p CABG and ischemic dilated CM with EF 35-30% at time of his colon surgery a year ago.He was recently hospitalized for LE cellulitis and had NSVT.  Due to his age and comorbidities he was not felt to be a candidate for ICD.  His BB was adjusted.  He was found to have a brachial ulnar and radial artery embolectomy of left arm while hospitalized with acute ischemia to the left hand.  He also had acute on chronic systolic CHF and was diuresed.  He is now here for followup of recent CHF exacerbation.   He denies any chest pain. He has chronic DOE which is unchanged. He denies any palpitations, dizziness or syncope. He occasionally has some mild RLE edema which is chronic.    Past Medical History  Diagnosis Date  . Hypertension   . Shortness of breath     "w/any activity lately" (02/03/2012)  . CHF (congestive heart failure)     mild/note 02/03/2012  . Arthritis     "left foot; fingers" (02/03/2012)  . ARF (acute renal failure)   . GIB (gastrointestinal bleeding)   . Chronic atrial fibrillation   . Non-sustained ventricular tachycardia   . Chronic anticoagulation   . Coronary artery disease 05/2000    s/p CABG with LIMA to LAD, SVG to IM, SVG to OM  . Mitral valvular regurgitation     s/p MVR with mechanical valve  . Ischemic dilated cardiomyopathy     EF 25-30% by echo 01/2012 at time of colon surgery  . Hyperlipidemia   . Stroke     after CABG  . Carcinoma of colon   . Basal cell carcinoma of shoulder 10/2010    Past Surgical History  Procedure Laterality Date  . Inguinal hernia repair     right  . Cataract extraction w/ intraocular lens  implant, bilateral  ~ 2011  . Valve replacement  05/2000    St. Jude 20mm mechanical valve  . Cardiac valve replacement  05/2000    St. Jude 31mm  . Esophagogastroduodenoscopy  02/08/2012    Procedure: ESOPHAGOGASTRODUODENOSCOPY (EGD);  Surgeon: Gatha Mayer, MD;  Location: [facility] ENDOSCOPY;  Service: Endoscopy;  Laterality: N/A;  . Colonoscopy  02/08/2012    Procedure: COLONOSCOPY;  Surgeon: Gatha Mayer, MD;  Location: [place];  Service: Endoscopy;  Laterality: N/A;  . Laparoscopic partial colectomy  02/10/2012    Procedure: LAPAROSCOPIC PARTIAL COLECTOMY;  Surgeon: Stark Klein, MD;  Location: [place];  Service: General;  Laterality: N/A;  . Coronary artery bypass graft      LIMA to LAD, SVG to IM, SVG to OM2  . Embolectomy Left 03/21/2014    Procedure: LEFT BRACHIAL EMBOLECTOMY;  Surgeon: Elam Dutch, MD;  Location: [facility] OR;  Service: Vascular;  Laterality: Left;     Current Outpatient Prescriptions  Medication Sig Dispense Refill  . acetaminophen (TYLENOL) 325 MG tablet Take 2 tablets (650 mg total) by mouth every 6 (six) hours as needed for mild pain (or Fever >/= 101).    . clobetasol cream (TEMOVATE)  0.05 % Apply 2.35 application topically daily.  2  . furosemide (LASIX) 40 MG tablet Take 1 tablet (40 mg total) by mouth 2 (two) times daily. 30 tablet   . hydrALAZINE (APRESOLINE) 25 MG tablet Take 25 mg by mouth 3 (three) times daily.     . metoprolol (LOPRESSOR) 50 MG tablet Take 100 mg by mouth 2 (two) times daily.    . Multiple Vitamin (MULTIVITAMIN WITH MINERALS) TABS Take 1 tablet by mouth daily.    . mupirocin cream (BACTROBAN) 2 % Apply topically daily. 15 g 0  . omega-3 acid ethyl esters (LOVAZA) 1 G capsule Take 1 g by mouth 3 (three) times daily.    . ondansetron (ZOFRAN) 4 MG tablet Take 1 tablet (4 mg total) by mouth every 6 (six) hours as needed for nausea. 20 tablet 0  . pantoprazole (PROTONIX) 40 MG tablet Take 40  mg by mouth daily.    . polyethylene glycol (MIRALAX / GLYCOLAX) packet Take 17 g by mouth daily as needed for mild constipation. 14 each 0  . simvastatin (ZOCOR) 10 MG tablet Take 10 mg by mouth at bedtime.    . traMADol (ULTRAM) 50 MG tablet Take 1 tablet (50 mg total) by mouth every 8 (eight) hours as needed. 20 tablet 0  . warfarin (COUMADIN) 2.5 MG tablet Take 2.5 mg by mouth See admin instructions. Patient takes Monday-Saturday    . warfarin (COUMADIN) 5 MG tablet Take 5 mg by mouth See admin instructions. Only on Sunday's     No current facility-administered medications for this visit.    Allergies:   Review of patient's allergies indicates no known allergies.    Social History:  The patient  reports that he quit smoking about 34 years ago. His smoking use included Cigarettes. He has a 20 pack-year smoking history. He has never used smokeless tobacco. He reports that he does not drink alcohol or use illicit drugs.   Family History:  The patient's family history includes Heart disease in his mother.    ROS:  Please see the history of present illness.   Otherwise, review of systems are positive for none.   All other systems are reviewed and negative.    PHYSICAL EXAM: VS:  BP 110/60 mmHg  Pulse 49  Ht 5\' 7"  (1.702 m)  Wt 138 lb 12.8 oz (62.959 kg)  BMI 21.73 kg/m2  SpO2 97% , BMI Body mass index is 21.73 kg/(m^2). GEN: Well nourished, well developed, in no acute distress HEENT: normal Neck: no JVD, carotid bruits, or masses Cardiac: irregularly irregular; no murmurs, rubs, or gallops,no edema  Respiratory:  clear to auscultation bilaterally, normal work of breathing GI: soft, nontender, nondistended, + BS MS: no deformity or atrophy Skin: warm and dry, no rash Neuro:  Strength and sensation are intact Psych: euthymic mood, full affect   EKG:  EKG is not ordered today.    Recent Labs: 03/14/2014: Magnesium 2.0 03/22/2014: ALT 31 03/24/2014: BUN 38*; Creatinine 1.39*;  Hemoglobin 11.1*; Platelets 203; Potassium 3.9; Sodium 137    Lipid Panel    Component Value Date/Time   CHOL 125 09/11/2013 0826   TRIG 104.0 09/11/2013 0826   HDL 47.20 09/11/2013 0826   CHOLHDL 3 09/11/2013 0826   VLDL 20.8 09/11/2013 0826   LDLCALC 57 09/11/2013 0826      Wt Readings from Last 3 Encounters:  04/02/14 138 lb 12.8 oz (62.959 kg)  03/21/14 142 lb 8 oz (64.638 kg)  03/14/14 140 lb  6.4 oz (63.685 kg)      Other studies Reviewed: Additional studies/ records that were reviewed today include: discharge summary from recent hospitalization and 2D echo Review of the above records demonstrates: treatment of NSVT and acute on chronic systolic CHF.  2D echo with severe LV dysfunction EF 25-30% with normal functioning mechanical MVR  ASSESSMENT AND PLAN  1. NSVT - not a candidate for ICD due to age and comorbidities - Continue  BB 2. Chronic systolic HF - he appears euvolemic on exam today - continue PO lasix 40mg  BID - no ACEI given renal dysfunction  - continue hydralazine TID and BB  - check BMET 3. LE cellulitis - resolved 4. Chronic atrial fibrillation: rate controlled, on coumadin 5. ICM - Echo 03/12/2013 EF 25-30%, diffuse hypokinesis, mechanical MV present, PA peak pressure 90mmHg 6. H/o St Jude mechanical MVR - on coumaidn 7. HTN - controlled 8. HLD - at goal on statin      Current medicines are reviewed at length with the patient today.  The patient does not have concerns regarding medicines.  The following changes have been made:  no change  Labs/ tests ordered today include: BMET    Disposition:   FU with me in 6 months   Signed, Sueanne Margarita, MD  04/02/2014 2:42 PM    [facility] [address], [place], [place]  [address] Phone: [phone]; Fax: [phone]

## 2014-04-02 NOTE — Patient Instructions (Signed)
Your physician recommends that you continue on your current medications as directed. Please refer to the Current Medication list given to you today.  Your physician recommends that you have lab work TODAY (BMET).  Your physician wants you to follow-up in: 6 months with Dr. Turner. You will receive a reminder letter in the mail two months in advance. If you Nathaneil't receive a letter, please call our office to schedule the follow-up appointment.  

## 2014-04-03 ENCOUNTER — Telehealth: Payer: Self-pay

## 2014-04-03 DIAGNOSIS — I10 Essential (primary) hypertension: Secondary | ICD-10-CM

## 2014-04-03 MED ORDER — POTASSIUM CHLORIDE ER 10 MEQ PO TBCR: 10.0000 meq | EXTENDED_RELEASE_TABLET | Freq: Every day | ORAL | Status: DC

## 2014-04-03 NOTE — Telephone Encounter (Signed)
-----   Message from Sueanne Margarita, MD sent at 04/02/2014  5:34 PM EST ----- Please add Kdur 48meq daily and recheck BMET in 1 week

## 2014-04-03 NOTE — Telephone Encounter (Signed)
Spoke with Therapist, sports at [employer]. Instructed her to START Kdur 10 meq daily and to recheck BMET before discharge on Saturday. She is to fax the results to our office.  Med list updated.

## 2014-04-04 ENCOUNTER — Non-Acute Institutional Stay (SKILLED_NURSING_FACILITY): Payer: Medicare Other | Admitting: Adult Health

## 2014-04-04 ENCOUNTER — Encounter: Payer: Self-pay | Admitting: Adult Health

## 2014-04-04 ENCOUNTER — Encounter: Payer: Self-pay | Admitting: Vascular Surgery

## 2014-04-04 DIAGNOSIS — N183 Chronic kidney disease, stage 3 unspecified: Secondary | ICD-10-CM

## 2014-04-04 DIAGNOSIS — I10 Essential (primary) hypertension: Secondary | ICD-10-CM

## 2014-04-04 DIAGNOSIS — I5022 Chronic systolic (congestive) heart failure: Secondary | ICD-10-CM

## 2014-04-04 DIAGNOSIS — R5381 Other malaise: Secondary | ICD-10-CM

## 2014-04-04 DIAGNOSIS — I482 Chronic atrial fibrillation, unspecified: Secondary | ICD-10-CM

## 2014-04-04 DIAGNOSIS — K5900 Constipation, unspecified: Secondary | ICD-10-CM | POA: Diagnosis not present

## 2014-04-04 DIAGNOSIS — K219 Gastro-esophageal reflux disease without esophagitis: Secondary | ICD-10-CM

## 2014-04-04 DIAGNOSIS — E785 Hyperlipidemia, unspecified: Secondary | ICD-10-CM

## 2014-04-04 DIAGNOSIS — I742 Embolism and thrombosis of arteries of the upper extremities: Secondary | ICD-10-CM

## 2014-04-04 NOTE — Progress Notes (Signed)
Patient ID: Alfred Dennis, male   DOB: 07-Jan-1924, 81 y.o.   MRN: [id number]   04/04/2014  Facility:  [facility] Home Location:  [place] Room Number: 1202-P LEVEL OF CARE:  SNF (31)   Chief Complaint  Patient presents with  . Discharge Note    Physical deconditioning, left arm arterial thrombus S/P Emobolectomy, atrial fibrillation, CHF, anemia, CKD stage III, hyperlipidemia and constipation    HISTORY OF PRESENT ILLNESS:  This is an 81 year old male who is for discharge home with home health PT, OT and nursing. He has been admitted to [facility] on 03/14/58 from [facility]. He has PMH of hypertension, hyperlipidemia and CAD status post CABG. He had an acute onset of pain, numbness and decreased strength to left hand. He was found to have left arm arterial thrombus and had embolectomy done. He was recently discharged from the hospital due to recent cellulitis of right foot which is now resolved.  Patient was admitted to this facility for short-term rehabilitation after the patient's recent hospitalization.  Patient has completed SNF rehabilitation and therapy has cleared the patient for discharge.  PAST MEDICAL HISTORY:  Past Medical History  Diagnosis Date  . Hypertension   . Shortness of breath     "w/any activity lately" (02/03/2012)  . CHF (congestive heart failure)     mild/note 02/03/2012  . Arthritis     "left foot; fingers" (02/03/2012)  . ARF (acute renal failure)   . GIB (gastrointestinal bleeding)   . Chronic atrial fibrillation   . Non-sustained ventricular tachycardia   . Chronic anticoagulation   . Coronary artery disease 05/2000    s/p CABG with LIMA to LAD, SVG to IM, SVG to OM  . Mitral valvular regurgitation     s/p MVR with mechanical valve  . Ischemic dilated cardiomyopathy     EF 25-30% by echo 01/2012 at time of colon surgery  . Hyperlipidemia   . Stroke     after CABG  . Carcinoma of colon   . Basal cell  carcinoma of shoulder 10/2010    CURRENT MEDICATIONS: Reviewed per MAR/see medication list  No Known Allergies   REVIEW OF SYSTEMS:  GENERAL: no change in appetite, no fatigue, no weight changes, no fever, chills or weakness RESPIRATORY: no cough, SOB, DOE, wheezing, hemoptysis CARDIAC: no chest pain, edema or palpitations GI: no abdominal pain, diarrhea, constipation, heart burn, nausea or vomiting  PHYSICAL EXAMINATION  GENERAL: no acute distress, normal body habitus EYES: conjunctivae normal, sclerae normal, normal eye lids NECK: supple, trachea midline, no neck masses, no thyroid tenderness, no thyromegaly LYMPHATICS: no LAN in the neck, no supraclavicular LAN RESPIRATORY: breathing is even & unlabored, BS CTAB CARDIAC: RRR, no murmur,no extra heart sounds, no edema GI: abdomen soft, normal BS, no masses, no tenderness, no hepatomegaly, no splenomegaly EXTREMITIES: Able to move 4 extremities PSYCHIATRIC: the patient is alert & oriented to person, affect & behavior appropriate  LABS/RADIOLOGY: 03/27/14  WBC 7.9 hemoglobin 11.2 hematocrit 33.9 MCV 92.4 sodium 143 potassium 3.8 glucose 99 BUN 46 creatinine 1.3 calcium 10.1 GFR 53.45 Labs reviewed: Basic Metabolic Panel:  Recent Labs  03/11/14 0600  03/13/14 0323 03/14/14 0315  03/22/14 1030 03/24/14 0448 04/02/14 1509  NA  --   < > 137 140  < > 139 137 139  K  --   < > 4.5 3.9  < > 4.3 3.9 3.4*  CL  --   < > 98  101  < > 107 100 102  CO2  --   < > 28 30  < > 26 26 29   GLUCOSE  --   < > 97 107*  < > 146* 119* 110*  BUN  --   < > 38* 44*  < > 31* 38* 38*  CREATININE  --   < > 1.13 1.61*  < > 1.40* 1.39* 1.43  CALCIUM  --   < > 9.0 8.9  < > 9.4 9.5 9.4  MG 1.9  --  2.1 2.0  --   --   --   --   < > = values in this interval not displayed.  Liver Function Tests:  Recent Labs  03/09/14 1945 03/10/14 0328 03/22/14 1030  AST 111* 82* 91*  ALT 56* 52 31  ALKPHOS 92 79 70  BILITOT 1.1 1.5* 0.8  PROT 6.8 5.8* 6.5    ALBUMIN 3.8 3.2* 3.4*   CBC:  Recent Labs  07/07/13 1450 03/09/14 1945  03/22/14 1030 03/23/14 0544 03/24/14 0448  WBC 9.3 14.4*  < > 8.7 8.6 11.7*  NEUTROABS 6.3 13.0*  --   --   --   --   HGB 12.2* 12.3*  < > 12.4* 11.2* 11.1*  HCT 38.0* 37.7*  < > 37.4* 34.1* 33.4*  MCV 91.5 92.2  < > 91.0 89.0 88.4  PLT 178 145*  < > 205 190 203  < > = values in this interval not displayed.  Lipid Panel:  Recent Labs  09/11/13 0826  HDL 47.20   Cardiac Enzymes:  Recent Labs  03/09/14 1945  TROPONINI 0.05*   Dg Chest 2 View  03/21/2014   CLINICAL DATA:  Preop, post mitral valve replacement  EXAM: CHEST  2 VIEW  COMPARISON:  03/09/2014  FINDINGS: Cardiomegaly again noted. Again noted medial sternotomy and mitral valve replacement. There is chronic elevation of the left hemidiaphragm. No acute infiltrate or pulmonary edema. Mild left basilar atelectasis.  IMPRESSION: Cardiomegaly. Status post median sternotomy and mitral valve replacement. Chronic elevation of the left hemidiaphragm with left basilar atelectasis. No acute infiltrate or pulmonary edema.   Electronically Signed   By: Lahoma Crocker M.D.   On: 03/21/2014 08:55   Ct Head Wo Contrast  03/10/2014   CLINICAL DATA:  Patient lost balance wall walking in slipped down to the floor. Chronic anticoagulation.  EXAM: CT HEAD WITHOUT CONTRAST  TECHNIQUE: Contiguous axial images were obtained from the base of the skull through the vertex without intravenous contrast.  COMPARISON:  None.  FINDINGS: Diffuse cerebral atrophy. Ventricular dilatation consistent with central atrophy. Low-attenuation changes in the deep white matter consistent with small vessel ischemia. Small focal area of encephalomalacia in the left occipital region consistent with old infarct. No mass effect or midline shift. No abnormal extra-axial fluid collections. Gray-white matter junctions are distinct. Basal cisterns are not effaced. No evidence of acute intracranial  hemorrhage. No depressed skull fractures. Visualized paranasal sinuses and mastoid air cells are not opacified.  IMPRESSION: No acute intracranial abnormalities. Chronic atrophy and small vessel ischemic changes. 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   Electronically Signed   By: Lucienne Capers M.D.   On: 03/10/2014 01:07   Dg Chest Port 1 View  03/09/2014   CLINICAL DATA:  Acute onset of generalized weakness for 1 day. Initial encounter.  EXAM: PORTABLE CHEST - 1 VIEW  COMPARISON:  Chest radiograph performed 02/12/2012  FINDINGS: Vascular congestion is noted. Minimal bilateral opacities could  reflect minimal interstitial edema or possibly a mild infectious process. There is elevation of the left hemidiaphragm. No pleural effusion or pneumothorax is seen.  The cardiomediastinal silhouette remains significantly enlarged. The patient is status post median sternotomy, with evidence of prior CABG. A valve replacement is noted. No acute osseous abnormalities are identified.  IMPRESSION: Vascular congestion and cardiomegaly noted. Minimal bilateral opacities could reflect minimal interstitial edema or possibly a mild infectious process, depending on the patient's symptoms. Stable chronic elevation of the left hemidiaphragm.   Electronically Signed   By: Garald Balding M.D.   On: 03/09/2014 19:50    ASSESSMENT/PLAN:  Physical deconditioning - for Home health PT, OT and Nursing Left arm arterial thromus S/P embolectomy - left arm surgical wound is dry, healing; continue Coumadin Atrial fibrillation - rate controlled; continue Lopressor 100 mg by mouth twice a day and Coumadin Chronic systolic CHF - stable; continue Lasix 40 mg by mouth twice a day and hydralazine 25 mg by mouth 3 times a day Anemia, acute blood loss - hemoglobin 11.2; stable Chronic kidney disease stage III - creatinine 1.3; monitor BMP Hyperlipidemia - continue simvastatin 10 mg by mouth daily at bedtime Constipation - continue MiraLAX when  necessary GERD - stable; continue pantoprazole 40 mg by mouth daily    I have filled out patient's discharge paperwork and written prescriptions.  Patient will receive home health PT, OT and Nursing.  Total discharge time: Greater than 30 minutes  Discharge time involved coordination of the discharge process with social worker, nursing staff and therapy department. Medical justification for home health services verified.     [facility], NP [employer] [phone]

## 2014-04-05 ENCOUNTER — Encounter: Payer: Medicare Other | Admitting: Vascular Surgery

## 2014-04-09 ENCOUNTER — Telehealth: Payer: Self-pay | Admitting: Cardiology

## 2014-04-09 DIAGNOSIS — E785 Hyperlipidemia, unspecified: Secondary | ICD-10-CM | POA: Diagnosis not present

## 2014-04-09 DIAGNOSIS — I129 Hypertensive chronic kidney disease with stage 1 through stage 4 chronic kidney disease, or unspecified chronic kidney disease: Secondary | ICD-10-CM | POA: Diagnosis not present

## 2014-04-09 DIAGNOSIS — Z48812 Encounter for surgical aftercare following surgery on the circulatory system: Secondary | ICD-10-CM | POA: Diagnosis not present

## 2014-04-09 DIAGNOSIS — Z7901 Long term (current) use of anticoagulants: Secondary | ICD-10-CM | POA: Diagnosis not present

## 2014-04-09 DIAGNOSIS — I509 Heart failure, unspecified: Secondary | ICD-10-CM | POA: Diagnosis not present

## 2014-04-09 DIAGNOSIS — D649 Anemia, unspecified: Secondary | ICD-10-CM | POA: Diagnosis not present

## 2014-04-09 DIAGNOSIS — K219 Gastro-esophageal reflux disease without esophagitis: Secondary | ICD-10-CM | POA: Diagnosis not present

## 2014-04-09 DIAGNOSIS — N183 Chronic kidney disease, stage 3 (moderate): Secondary | ICD-10-CM | POA: Diagnosis not present

## 2014-04-09 DIAGNOSIS — I4891 Unspecified atrial fibrillation: Secondary | ICD-10-CM | POA: Diagnosis not present

## 2014-04-09 DIAGNOSIS — S99822D Other specified injuries of left foot, subsequent encounter: Secondary | ICD-10-CM | POA: Diagnosis not present

## 2014-04-09 NOTE — Telephone Encounter (Signed)
New message      Pt was discharged from [facility]----need to know when pt needs another pt/inr.  Nurse is at pts home now.

## 2014-04-09 NOTE — Telephone Encounter (Signed)
Spoke with [place] with [facility].  Pt discharged home on Saturday.  Gave orders to recheck INR at next visit on Thursday.

## 2014-04-09 NOTE — Telephone Encounter (Signed)
To [facility] for instruction.

## 2014-04-10 DIAGNOSIS — Z48812 Encounter for surgical aftercare following surgery on the circulatory system: Secondary | ICD-10-CM | POA: Diagnosis not present

## 2014-04-10 DIAGNOSIS — N183 Chronic kidney disease, stage 3 (moderate): Secondary | ICD-10-CM | POA: Diagnosis not present

## 2014-04-10 DIAGNOSIS — I129 Hypertensive chronic kidney disease with stage 1 through stage 4 chronic kidney disease, or unspecified chronic kidney disease: Secondary | ICD-10-CM | POA: Diagnosis not present

## 2014-04-10 DIAGNOSIS — I4891 Unspecified atrial fibrillation: Secondary | ICD-10-CM | POA: Diagnosis not present

## 2014-04-10 DIAGNOSIS — I509 Heart failure, unspecified: Secondary | ICD-10-CM | POA: Diagnosis not present

## 2014-04-10 DIAGNOSIS — S99822D Other specified injuries of left foot, subsequent encounter: Secondary | ICD-10-CM | POA: Diagnosis not present

## 2014-04-11 ENCOUNTER — Encounter: Payer: Self-pay | Admitting: Vascular Surgery

## 2014-04-11 DIAGNOSIS — S99822D Other specified injuries of left foot, subsequent encounter: Secondary | ICD-10-CM | POA: Diagnosis not present

## 2014-04-11 DIAGNOSIS — I509 Heart failure, unspecified: Secondary | ICD-10-CM | POA: Diagnosis not present

## 2014-04-11 DIAGNOSIS — I4891 Unspecified atrial fibrillation: Secondary | ICD-10-CM | POA: Diagnosis not present

## 2014-04-11 DIAGNOSIS — I129 Hypertensive chronic kidney disease with stage 1 through stage 4 chronic kidney disease, or unspecified chronic kidney disease: Secondary | ICD-10-CM | POA: Diagnosis not present

## 2014-04-11 DIAGNOSIS — N183 Chronic kidney disease, stage 3 (moderate): Secondary | ICD-10-CM | POA: Diagnosis not present

## 2014-04-11 DIAGNOSIS — Z48812 Encounter for surgical aftercare following surgery on the circulatory system: Secondary | ICD-10-CM | POA: Diagnosis not present

## 2014-04-12 ENCOUNTER — Encounter: Payer: Self-pay | Admitting: Vascular Surgery

## 2014-04-12 ENCOUNTER — Ambulatory Visit (INDEPENDENT_AMBULATORY_CARE_PROVIDER_SITE_OTHER): Payer: Medicare Other | Admitting: Cardiology

## 2014-04-12 ENCOUNTER — Telehealth: Payer: Self-pay | Admitting: Cardiology

## 2014-04-12 ENCOUNTER — Ambulatory Visit (INDEPENDENT_AMBULATORY_CARE_PROVIDER_SITE_OTHER): Payer: Self-pay | Admitting: Vascular Surgery

## 2014-04-12 VITALS — BP 109/74 | HR 46 | Temp 97.6°F | Resp 14 | Ht 66.0 in | Wt 147.0 lb

## 2014-04-12 DIAGNOSIS — I129 Hypertensive chronic kidney disease with stage 1 through stage 4 chronic kidney disease, or unspecified chronic kidney disease: Secondary | ICD-10-CM | POA: Diagnosis not present

## 2014-04-12 DIAGNOSIS — I509 Heart failure, unspecified: Secondary | ICD-10-CM | POA: Diagnosis not present

## 2014-04-12 DIAGNOSIS — Z48812 Encounter for surgical aftercare following surgery on the circulatory system: Secondary | ICD-10-CM

## 2014-04-12 DIAGNOSIS — Z5181 Encounter for therapeutic drug level monitoring: Secondary | ICD-10-CM

## 2014-04-12 DIAGNOSIS — I742 Embolism and thrombosis of arteries of the upper extremities: Secondary | ICD-10-CM

## 2014-04-12 DIAGNOSIS — N183 Chronic kidney disease, stage 3 (moderate): Secondary | ICD-10-CM | POA: Diagnosis not present

## 2014-04-12 DIAGNOSIS — I059 Rheumatic mitral valve disease, unspecified: Secondary | ICD-10-CM

## 2014-04-12 DIAGNOSIS — S99822D Other specified injuries of left foot, subsequent encounter: Secondary | ICD-10-CM | POA: Diagnosis not present

## 2014-04-12 DIAGNOSIS — I4891 Unspecified atrial fibrillation: Secondary | ICD-10-CM | POA: Diagnosis not present

## 2014-04-12 LAB — POCT INR: INR: 2.1

## 2014-04-12 NOTE — Telephone Encounter (Signed)
[place] Msg       Pt c/o medication issue: 1. Name of Medication: Hydralazine 2. How are you currently taking this medication (dosage and times per day)? 3x's a day 3. Are you having a reaction (difficulty breathing--STAT)?  No 4. What is your medication issue? Pt has misses 2nd dose that he normally takes at noon. Should he double the dosage or skip altogether?  Pt normally takes meds at 9am 12pm and bedtime.  Please return call, to [name], pt daughter at [phone].

## 2014-04-12 NOTE — Progress Notes (Signed)
Patient is an 81 year old male who is status post brachial ulnar radial artery embolectomy on January 13. He states the numbness and tingling in his hand is completely resolved. He is now on Coumadin. He denies any incisional drainage.  Current Outpatient Prescriptions on File Prior to Visit  Medication Sig Dispense Refill  . acetaminophen (TYLENOL) 325 MG tablet Take 2 tablets (650 mg total) by mouth every 6 (six) hours as needed for mild pain (or Fever >/= 101).    . clobetasol cream (TEMOVATE) 0.05 % Apply 2.99 application topically daily.  2  . furosemide (LASIX) 40 MG tablet Take 1 tablet (40 mg total) by mouth 2 (two) times daily. 30 tablet   . hydrALAZINE (APRESOLINE) 25 MG tablet Take 25 mg by mouth 3 (three) times daily.     . metoprolol (LOPRESSOR) 50 MG tablet Take 100 mg by mouth 2 (two) times daily.    . Multiple Vitamin (MULTIVITAMIN WITH MINERALS) TABS Take 1 tablet by mouth daily.    . mupirocin cream (BACTROBAN) 2 % Apply topically daily. 15 g 0  . omega-3 acid ethyl esters (LOVAZA) 1 G capsule Take 1 g by mouth 3 (three) times daily.    . ondansetron (ZOFRAN) 4 MG tablet Take 1 tablet (4 mg total) by mouth every 6 (six) hours as needed for nausea. 20 tablet 0  . pantoprazole (PROTONIX) 40 MG tablet Take 40 mg by mouth daily.    . polyethylene glycol (MIRALAX / GLYCOLAX) packet Take 17 g by mouth daily as needed for mild constipation. 14 each 0  . potassium chloride (K-DUR) 10 MEQ tablet Take 1 tablet (10 mEq total) by mouth daily. 90 tablet 3  . simvastatin (ZOCOR) 10 MG tablet Take 10 mg by mouth at bedtime.    . traMADol (ULTRAM) 50 MG tablet Take 1 tablet (50 mg total) by mouth every 8 (eight) hours as needed. 20 tablet 0  . warfarin (COUMADIN) 2.5 MG tablet Take 2.5 mg by mouth See admin instructions. Patient takes Monday-Saturday    . warfarin (COUMADIN) 5 MG tablet Take 5 mg by mouth See admin instructions. Only on Sunday's     No current facility-administered medications  on file prior to visit.   Physical exam:  Filed Vitals:   04/12/14 1347  BP: 109/74  Pulse: 46  Temp: 97.6 F (36.4 C)  TempSrc: Oral  Resp: 14  Height: 5\' 6"  (1.676 m)  Weight: 147 lb (66.679 kg)  SpO2: 98%    Left upper extremity: Healing antecubital incision 2+ radial pulse 2+ brachial pulse  Assessment: Doing well status post left brachial embolectomy.  Plan: Follow-up as needed. Most likely continue Coumadin for life.  Ruta Hinds, MD [facility] Office: [phone] Pager: [phone]

## 2014-04-12 NOTE — Telephone Encounter (Signed)
Instructed Alfred Dennis that if BP is fine, to take the medication now and before bed.  She agrees with treatment plan.

## 2014-04-16 DIAGNOSIS — I129 Hypertensive chronic kidney disease with stage 1 through stage 4 chronic kidney disease, or unspecified chronic kidney disease: Secondary | ICD-10-CM | POA: Diagnosis not present

## 2014-04-16 DIAGNOSIS — I4891 Unspecified atrial fibrillation: Secondary | ICD-10-CM | POA: Diagnosis not present

## 2014-04-16 DIAGNOSIS — I509 Heart failure, unspecified: Secondary | ICD-10-CM | POA: Diagnosis not present

## 2014-04-16 DIAGNOSIS — N183 Chronic kidney disease, stage 3 (moderate): Secondary | ICD-10-CM | POA: Diagnosis not present

## 2014-04-16 DIAGNOSIS — S99822D Other specified injuries of left foot, subsequent encounter: Secondary | ICD-10-CM | POA: Diagnosis not present

## 2014-04-16 DIAGNOSIS — Z48812 Encounter for surgical aftercare following surgery on the circulatory system: Secondary | ICD-10-CM | POA: Diagnosis not present

## 2014-04-17 ENCOUNTER — Telehealth: Payer: Self-pay | Admitting: Cardiology

## 2014-04-17 DIAGNOSIS — I4891 Unspecified atrial fibrillation: Secondary | ICD-10-CM | POA: Diagnosis not present

## 2014-04-17 DIAGNOSIS — I509 Heart failure, unspecified: Secondary | ICD-10-CM | POA: Diagnosis not present

## 2014-04-17 DIAGNOSIS — S99822D Other specified injuries of left foot, subsequent encounter: Secondary | ICD-10-CM | POA: Diagnosis not present

## 2014-04-17 DIAGNOSIS — N183 Chronic kidney disease, stage 3 (moderate): Secondary | ICD-10-CM | POA: Diagnosis not present

## 2014-04-17 DIAGNOSIS — Z48812 Encounter for surgical aftercare following surgery on the circulatory system: Secondary | ICD-10-CM | POA: Diagnosis not present

## 2014-04-17 DIAGNOSIS — I129 Hypertensive chronic kidney disease with stage 1 through stage 4 chronic kidney disease, or unspecified chronic kidney disease: Secondary | ICD-10-CM | POA: Diagnosis not present

## 2014-04-17 NOTE — Telephone Encounter (Signed)
Spoke with pt daughter, [name], she is moving back with parents to help care for them and encourage new low sodium diet.  With new diet she is putting together she is worried dad is on to much medication.  Educated daughter that though a low sodium diet is very helpful in to maintain fluid levels the pt has CHF and needs these medications in addition to help keep fluid from accumulating.   Pt was released from the hospital on these medications and wants Dr. Radford Pax to make adjustments if needed since pt will be on a new diet.  Pt has [place] in the home every other day this week RN and PT that are monitoring his vitals with each visit.  Daughter instructed to start keeping tracking pt. VS, specifically HR and BP daily to have a better idea of normals.  They are already keeping log of pts weights.   Educated daughter since diet is new and pt is expected to see Dr. Radford Pax in July for 6 month follow-up, which gives plenty of time for all the changes to set in.  If at anytime between now and then she can feel free to call our office and discuss any changes in BP, HR, and weights if she has questions but to keep with current medication schedule unless adjusted by a healthcare provider.

## 2014-04-17 NOTE — Telephone Encounter (Signed)
New message     Pt c/o medication issue:  1. Name of Medication: lasix and lopressor  2. How are you currently taking this medication (dosage and times per day)? Lasix 40mg  bid; lopressor 100mg  bid  3. Are you having a reaction (difficulty breathing--STAT)? no  4. What is your medication issue? Daughter is concerned that being on a low sodium diet may effect his bp. She thinks his lasix may need to be cut back.  The fluid is off.

## 2014-04-18 DIAGNOSIS — I4891 Unspecified atrial fibrillation: Secondary | ICD-10-CM | POA: Diagnosis not present

## 2014-04-18 DIAGNOSIS — I129 Hypertensive chronic kidney disease with stage 1 through stage 4 chronic kidney disease, or unspecified chronic kidney disease: Secondary | ICD-10-CM | POA: Diagnosis not present

## 2014-04-18 DIAGNOSIS — N183 Chronic kidney disease, stage 3 (moderate): Secondary | ICD-10-CM | POA: Diagnosis not present

## 2014-04-18 DIAGNOSIS — S99822D Other specified injuries of left foot, subsequent encounter: Secondary | ICD-10-CM | POA: Diagnosis not present

## 2014-04-18 DIAGNOSIS — I509 Heart failure, unspecified: Secondary | ICD-10-CM | POA: Diagnosis not present

## 2014-04-18 DIAGNOSIS — Z48812 Encounter for surgical aftercare following surgery on the circulatory system: Secondary | ICD-10-CM | POA: Diagnosis not present

## 2014-04-19 ENCOUNTER — Ambulatory Visit (INDEPENDENT_AMBULATORY_CARE_PROVIDER_SITE_OTHER): Payer: Medicare Other | Admitting: Internal Medicine

## 2014-04-19 DIAGNOSIS — N183 Chronic kidney disease, stage 3 (moderate): Secondary | ICD-10-CM | POA: Diagnosis not present

## 2014-04-19 DIAGNOSIS — I059 Rheumatic mitral valve disease, unspecified: Secondary | ICD-10-CM

## 2014-04-19 DIAGNOSIS — Z5181 Encounter for therapeutic drug level monitoring: Secondary | ICD-10-CM

## 2014-04-19 DIAGNOSIS — Z48812 Encounter for surgical aftercare following surgery on the circulatory system: Secondary | ICD-10-CM | POA: Diagnosis not present

## 2014-04-19 DIAGNOSIS — I4891 Unspecified atrial fibrillation: Secondary | ICD-10-CM | POA: Diagnosis not present

## 2014-04-19 DIAGNOSIS — I509 Heart failure, unspecified: Secondary | ICD-10-CM | POA: Diagnosis not present

## 2014-04-19 DIAGNOSIS — S99822D Other specified injuries of left foot, subsequent encounter: Secondary | ICD-10-CM | POA: Diagnosis not present

## 2014-04-19 DIAGNOSIS — I129 Hypertensive chronic kidney disease with stage 1 through stage 4 chronic kidney disease, or unspecified chronic kidney disease: Secondary | ICD-10-CM | POA: Diagnosis not present

## 2014-04-19 LAB — POCT INR: INR: 1.8

## 2014-04-24 DIAGNOSIS — I509 Heart failure, unspecified: Secondary | ICD-10-CM | POA: Diagnosis not present

## 2014-04-24 DIAGNOSIS — S99822D Other specified injuries of left foot, subsequent encounter: Secondary | ICD-10-CM | POA: Diagnosis not present

## 2014-04-24 DIAGNOSIS — I4891 Unspecified atrial fibrillation: Secondary | ICD-10-CM | POA: Diagnosis not present

## 2014-04-24 DIAGNOSIS — N183 Chronic kidney disease, stage 3 (moderate): Secondary | ICD-10-CM | POA: Diagnosis not present

## 2014-04-24 DIAGNOSIS — I129 Hypertensive chronic kidney disease with stage 1 through stage 4 chronic kidney disease, or unspecified chronic kidney disease: Secondary | ICD-10-CM | POA: Diagnosis not present

## 2014-04-24 DIAGNOSIS — Z48812 Encounter for surgical aftercare following surgery on the circulatory system: Secondary | ICD-10-CM | POA: Diagnosis not present

## 2014-04-26 ENCOUNTER — Ambulatory Visit (INDEPENDENT_AMBULATORY_CARE_PROVIDER_SITE_OTHER): Payer: Medicare Other | Admitting: Cardiology

## 2014-04-26 DIAGNOSIS — Z5181 Encounter for therapeutic drug level monitoring: Secondary | ICD-10-CM

## 2014-04-26 DIAGNOSIS — S99822D Other specified injuries of left foot, subsequent encounter: Secondary | ICD-10-CM | POA: Diagnosis not present

## 2014-04-26 DIAGNOSIS — I129 Hypertensive chronic kidney disease with stage 1 through stage 4 chronic kidney disease, or unspecified chronic kidney disease: Secondary | ICD-10-CM | POA: Diagnosis not present

## 2014-04-26 DIAGNOSIS — I4891 Unspecified atrial fibrillation: Secondary | ICD-10-CM | POA: Diagnosis not present

## 2014-04-26 DIAGNOSIS — Z48812 Encounter for surgical aftercare following surgery on the circulatory system: Secondary | ICD-10-CM | POA: Diagnosis not present

## 2014-04-26 DIAGNOSIS — N183 Chronic kidney disease, stage 3 (moderate): Secondary | ICD-10-CM | POA: Diagnosis not present

## 2014-04-26 DIAGNOSIS — I509 Heart failure, unspecified: Secondary | ICD-10-CM | POA: Diagnosis not present

## 2014-04-26 DIAGNOSIS — I059 Rheumatic mitral valve disease, unspecified: Secondary | ICD-10-CM

## 2014-04-26 LAB — POCT INR: INR: 2.6

## 2014-05-02 ENCOUNTER — Other Ambulatory Visit: Payer: Self-pay | Admitting: Cardiology

## 2014-05-03 DIAGNOSIS — Z48812 Encounter for surgical aftercare following surgery on the circulatory system: Secondary | ICD-10-CM | POA: Diagnosis not present

## 2014-05-03 DIAGNOSIS — N183 Chronic kidney disease, stage 3 (moderate): Secondary | ICD-10-CM | POA: Diagnosis not present

## 2014-05-03 DIAGNOSIS — I129 Hypertensive chronic kidney disease with stage 1 through stage 4 chronic kidney disease, or unspecified chronic kidney disease: Secondary | ICD-10-CM | POA: Diagnosis not present

## 2014-05-03 DIAGNOSIS — S99822D Other specified injuries of left foot, subsequent encounter: Secondary | ICD-10-CM | POA: Diagnosis not present

## 2014-05-03 DIAGNOSIS — I509 Heart failure, unspecified: Secondary | ICD-10-CM | POA: Diagnosis not present

## 2014-05-03 DIAGNOSIS — I4891 Unspecified atrial fibrillation: Secondary | ICD-10-CM | POA: Diagnosis not present

## 2014-05-07 ENCOUNTER — Telehealth: Payer: Self-pay | Admitting: Cardiology

## 2014-05-07 NOTE — Telephone Encounter (Signed)
New Message     Patient needs orders for a social work consult, and CHF management and education. Please give Ashok Cordia a call.

## 2014-05-08 NOTE — Telephone Encounter (Signed)
Please order a social work consult for transportation issues

## 2014-05-08 NOTE — Telephone Encounter (Signed)
Cora [facility] RN) requests order for social work consult and CHF management and education for patient. She was originally ordered for wound care upon hospital discharge, but she says that the patient mostly complains about needing help to get to his appointments (his daughter cannot always help him) and money.

## 2014-05-09 NOTE — Telephone Encounter (Signed)
Per Dr. Radford Pax, [facility] referrals for CHF management and education and social work. Rx written and placed in Medical Records "to be faxed" bin.

## 2014-05-10 ENCOUNTER — Ambulatory Visit (INDEPENDENT_AMBULATORY_CARE_PROVIDER_SITE_OTHER): Payer: Medicare Other | Admitting: Cardiovascular Disease

## 2014-05-10 DIAGNOSIS — S99822D Other specified injuries of left foot, subsequent encounter: Secondary | ICD-10-CM | POA: Diagnosis not present

## 2014-05-10 DIAGNOSIS — I509 Heart failure, unspecified: Secondary | ICD-10-CM | POA: Diagnosis not present

## 2014-05-10 DIAGNOSIS — I059 Rheumatic mitral valve disease, unspecified: Secondary | ICD-10-CM

## 2014-05-10 DIAGNOSIS — I129 Hypertensive chronic kidney disease with stage 1 through stage 4 chronic kidney disease, or unspecified chronic kidney disease: Secondary | ICD-10-CM | POA: Diagnosis not present

## 2014-05-10 DIAGNOSIS — N183 Chronic kidney disease, stage 3 (moderate): Secondary | ICD-10-CM | POA: Diagnosis not present

## 2014-05-10 DIAGNOSIS — Z48812 Encounter for surgical aftercare following surgery on the circulatory system: Secondary | ICD-10-CM | POA: Diagnosis not present

## 2014-05-10 DIAGNOSIS — I4891 Unspecified atrial fibrillation: Secondary | ICD-10-CM | POA: Diagnosis not present

## 2014-05-10 DIAGNOSIS — Z5181 Encounter for therapeutic drug level monitoring: Secondary | ICD-10-CM

## 2014-05-10 LAB — POCT INR: INR: 3.8

## 2014-05-17 ENCOUNTER — Telehealth: Payer: Self-pay | Admitting: Cardiology

## 2014-05-17 DIAGNOSIS — Z48812 Encounter for surgical aftercare following surgery on the circulatory system: Secondary | ICD-10-CM | POA: Diagnosis not present

## 2014-05-17 DIAGNOSIS — I509 Heart failure, unspecified: Secondary | ICD-10-CM | POA: Diagnosis not present

## 2014-05-17 DIAGNOSIS — I4891 Unspecified atrial fibrillation: Secondary | ICD-10-CM | POA: Diagnosis not present

## 2014-05-17 DIAGNOSIS — I129 Hypertensive chronic kidney disease with stage 1 through stage 4 chronic kidney disease, or unspecified chronic kidney disease: Secondary | ICD-10-CM | POA: Diagnosis not present

## 2014-05-17 DIAGNOSIS — N183 Chronic kidney disease, stage 3 (moderate): Secondary | ICD-10-CM | POA: Diagnosis not present

## 2014-05-17 DIAGNOSIS — S99822D Other specified injuries of left foot, subsequent encounter: Secondary | ICD-10-CM | POA: Diagnosis not present

## 2014-05-17 NOTE — Telephone Encounter (Signed)
Informed Cora that Dr. Radford Pax is the patient's cardiologist, and the patient's PCP should be consulted for wound orders. Cora agrees with treatment plan.

## 2014-05-17 NOTE — Telephone Encounter (Signed)
New message      Want an order from Dr Radford Pax to treat a sore on his rt foot.  She is already treating his left foot.  Vital signs are 102/50, pulse 58 irregular.

## 2014-05-18 DIAGNOSIS — Z952 Presence of prosthetic heart valve: Secondary | ICD-10-CM | POA: Diagnosis not present

## 2014-05-18 DIAGNOSIS — I509 Heart failure, unspecified: Secondary | ICD-10-CM | POA: Diagnosis not present

## 2014-05-18 DIAGNOSIS — I119 Hypertensive heart disease without heart failure: Secondary | ICD-10-CM | POA: Diagnosis not present

## 2014-05-18 DIAGNOSIS — I2510 Atherosclerotic heart disease of native coronary artery without angina pectoris: Secondary | ICD-10-CM | POA: Diagnosis not present

## 2014-05-18 DIAGNOSIS — L02611 Cutaneous abscess of right foot: Secondary | ICD-10-CM | POA: Diagnosis not present

## 2014-05-18 DIAGNOSIS — I052 Rheumatic mitral stenosis with insufficiency: Secondary | ICD-10-CM | POA: Diagnosis not present

## 2014-05-18 DIAGNOSIS — I639 Cerebral infarction, unspecified: Secondary | ICD-10-CM | POA: Diagnosis not present

## 2014-05-18 DIAGNOSIS — I129 Hypertensive chronic kidney disease with stage 1 through stage 4 chronic kidney disease, or unspecified chronic kidney disease: Secondary | ICD-10-CM | POA: Diagnosis not present

## 2014-05-21 ENCOUNTER — Ambulatory Visit (INDEPENDENT_AMBULATORY_CARE_PROVIDER_SITE_OTHER): Payer: Medicare Other | Admitting: Pharmacist

## 2014-05-21 DIAGNOSIS — I4891 Unspecified atrial fibrillation: Secondary | ICD-10-CM | POA: Diagnosis not present

## 2014-05-21 DIAGNOSIS — I129 Hypertensive chronic kidney disease with stage 1 through stage 4 chronic kidney disease, or unspecified chronic kidney disease: Secondary | ICD-10-CM | POA: Diagnosis not present

## 2014-05-21 DIAGNOSIS — N183 Chronic kidney disease, stage 3 (moderate): Secondary | ICD-10-CM | POA: Diagnosis not present

## 2014-05-21 DIAGNOSIS — I509 Heart failure, unspecified: Secondary | ICD-10-CM | POA: Diagnosis not present

## 2014-05-21 DIAGNOSIS — S99822D Other specified injuries of left foot, subsequent encounter: Secondary | ICD-10-CM | POA: Diagnosis not present

## 2014-05-21 DIAGNOSIS — Z5181 Encounter for therapeutic drug level monitoring: Secondary | ICD-10-CM

## 2014-05-21 DIAGNOSIS — Z48812 Encounter for surgical aftercare following surgery on the circulatory system: Secondary | ICD-10-CM | POA: Diagnosis not present

## 2014-05-21 DIAGNOSIS — I059 Rheumatic mitral valve disease, unspecified: Secondary | ICD-10-CM

## 2014-05-21 LAB — POCT INR: INR: 3.6

## 2014-05-29 DIAGNOSIS — Z48812 Encounter for surgical aftercare following surgery on the circulatory system: Secondary | ICD-10-CM | POA: Diagnosis not present

## 2014-05-29 DIAGNOSIS — S99822D Other specified injuries of left foot, subsequent encounter: Secondary | ICD-10-CM | POA: Diagnosis not present

## 2014-05-29 DIAGNOSIS — I129 Hypertensive chronic kidney disease with stage 1 through stage 4 chronic kidney disease, or unspecified chronic kidney disease: Secondary | ICD-10-CM | POA: Diagnosis not present

## 2014-05-29 DIAGNOSIS — I509 Heart failure, unspecified: Secondary | ICD-10-CM | POA: Diagnosis not present

## 2014-05-29 DIAGNOSIS — I4891 Unspecified atrial fibrillation: Secondary | ICD-10-CM | POA: Diagnosis not present

## 2014-05-29 DIAGNOSIS — N183 Chronic kidney disease, stage 3 (moderate): Secondary | ICD-10-CM | POA: Diagnosis not present

## 2014-06-05 ENCOUNTER — Ambulatory Visit (INDEPENDENT_AMBULATORY_CARE_PROVIDER_SITE_OTHER): Payer: Medicare Other | Admitting: Interventional Cardiology

## 2014-06-05 DIAGNOSIS — I059 Rheumatic mitral valve disease, unspecified: Secondary | ICD-10-CM

## 2014-06-05 DIAGNOSIS — S99822D Other specified injuries of left foot, subsequent encounter: Secondary | ICD-10-CM | POA: Diagnosis not present

## 2014-06-05 DIAGNOSIS — Z5181 Encounter for therapeutic drug level monitoring: Secondary | ICD-10-CM

## 2014-06-05 DIAGNOSIS — I509 Heart failure, unspecified: Secondary | ICD-10-CM | POA: Diagnosis not present

## 2014-06-05 DIAGNOSIS — I129 Hypertensive chronic kidney disease with stage 1 through stage 4 chronic kidney disease, or unspecified chronic kidney disease: Secondary | ICD-10-CM | POA: Diagnosis not present

## 2014-06-05 DIAGNOSIS — N183 Chronic kidney disease, stage 3 (moderate): Secondary | ICD-10-CM | POA: Diagnosis not present

## 2014-06-05 DIAGNOSIS — Z48812 Encounter for surgical aftercare following surgery on the circulatory system: Secondary | ICD-10-CM | POA: Diagnosis not present

## 2014-06-05 DIAGNOSIS — I4891 Unspecified atrial fibrillation: Secondary | ICD-10-CM | POA: Diagnosis not present

## 2014-06-05 LAB — POCT INR: INR: 4.2

## 2014-06-07 DIAGNOSIS — I739 Peripheral vascular disease, unspecified: Secondary | ICD-10-CM | POA: Diagnosis not present

## 2014-06-07 DIAGNOSIS — L603 Nail dystrophy: Secondary | ICD-10-CM | POA: Diagnosis not present

## 2014-06-07 DIAGNOSIS — E1151 Type 2 diabetes mellitus with diabetic peripheral angiopathy without gangrene: Secondary | ICD-10-CM | POA: Diagnosis not present

## 2014-06-07 DIAGNOSIS — L84 Corns and callosities: Secondary | ICD-10-CM | POA: Diagnosis not present

## 2014-06-19 ENCOUNTER — Ambulatory Visit (INDEPENDENT_AMBULATORY_CARE_PROVIDER_SITE_OTHER): Payer: Medicare Other | Admitting: *Deleted

## 2014-06-19 DIAGNOSIS — I059 Rheumatic mitral valve disease, unspecified: Secondary | ICD-10-CM

## 2014-06-19 DIAGNOSIS — Z954 Presence of other heart-valve replacement: Secondary | ICD-10-CM | POA: Diagnosis not present

## 2014-06-19 DIAGNOSIS — Z5181 Encounter for therapeutic drug level monitoring: Secondary | ICD-10-CM | POA: Diagnosis not present

## 2014-06-19 DIAGNOSIS — Z952 Presence of prosthetic heart valve: Secondary | ICD-10-CM

## 2014-06-19 LAB — POCT INR: INR: 2.5

## 2014-06-25 ENCOUNTER — Telehealth: Payer: Self-pay | Admitting: Cardiology

## 2014-06-25 ENCOUNTER — Telehealth: Payer: Self-pay | Admitting: Oncology

## 2014-06-25 NOTE — Telephone Encounter (Signed)
New message         [facility] nurse is calling about orders that were faxed for Dr. Radford Pax to sign

## 2014-06-25 NOTE — Telephone Encounter (Signed)
pt called to cx appt did not want to r/s at this time.Marland Kitchen...will call back to r/s

## 2014-06-26 NOTE — Telephone Encounter (Signed)
Instructed Jocelyn Lamer to send another fax; that if she has not received it back we probably did not receive it. Jocelyn Lamer to send paperwork for Dr. Radford Pax to sign.

## 2014-06-27 NOTE — Telephone Encounter (Signed)
Paperwork received and given to Dr. Radford Pax for review.

## 2014-06-28 ENCOUNTER — Encounter: Payer: Self-pay | Admitting: Cardiology

## 2014-07-03 ENCOUNTER — Ambulatory Visit (INDEPENDENT_AMBULATORY_CARE_PROVIDER_SITE_OTHER): Payer: Medicare Other | Admitting: *Deleted

## 2014-07-03 DIAGNOSIS — Z954 Presence of other heart-valve replacement: Secondary | ICD-10-CM | POA: Diagnosis not present

## 2014-07-03 DIAGNOSIS — Z5181 Encounter for therapeutic drug level monitoring: Secondary | ICD-10-CM

## 2014-07-03 DIAGNOSIS — I059 Rheumatic mitral valve disease, unspecified: Secondary | ICD-10-CM

## 2014-07-03 DIAGNOSIS — Z952 Presence of prosthetic heart valve: Secondary | ICD-10-CM

## 2014-07-03 LAB — POCT INR: INR: 1.7

## 2014-07-10 ENCOUNTER — Telehealth: Payer: Self-pay | Admitting: Cardiology

## 2014-07-10 NOTE — Telephone Encounter (Signed)
Left message to call back  

## 2014-07-10 NOTE — Telephone Encounter (Signed)
New Message  Rn from [facility] calling about home health orders need to be filled out by Dr. Radford Pax for this pt. Please call back and dsicuss.

## 2014-07-13 NOTE — Telephone Encounter (Signed)
Spoke with [employer]. Let her know that the paperwork is being faxed, but one order is marked as incorrect.

## 2014-07-16 ENCOUNTER — Ambulatory Visit: Payer: Medicare Other | Admitting: Oncology

## 2014-07-16 ENCOUNTER — Other Ambulatory Visit: Payer: Medicare Other

## 2014-07-17 ENCOUNTER — Ambulatory Visit (INDEPENDENT_AMBULATORY_CARE_PROVIDER_SITE_OTHER): Payer: Medicare Other

## 2014-07-17 DIAGNOSIS — Z954 Presence of other heart-valve replacement: Secondary | ICD-10-CM | POA: Diagnosis not present

## 2014-07-17 DIAGNOSIS — I059 Rheumatic mitral valve disease, unspecified: Secondary | ICD-10-CM | POA: Diagnosis not present

## 2014-07-17 DIAGNOSIS — Z5181 Encounter for therapeutic drug level monitoring: Secondary | ICD-10-CM

## 2014-07-17 DIAGNOSIS — Z952 Presence of prosthetic heart valve: Secondary | ICD-10-CM

## 2014-07-17 LAB — POCT INR: INR: 2.3

## 2014-07-31 ENCOUNTER — Ambulatory Visit (INDEPENDENT_AMBULATORY_CARE_PROVIDER_SITE_OTHER): Payer: Medicare Other | Admitting: *Deleted

## 2014-07-31 DIAGNOSIS — I059 Rheumatic mitral valve disease, unspecified: Secondary | ICD-10-CM | POA: Diagnosis not present

## 2014-07-31 DIAGNOSIS — Z5181 Encounter for therapeutic drug level monitoring: Secondary | ICD-10-CM

## 2014-07-31 DIAGNOSIS — Z954 Presence of other heart-valve replacement: Secondary | ICD-10-CM | POA: Diagnosis not present

## 2014-07-31 DIAGNOSIS — Z952 Presence of prosthetic heart valve: Secondary | ICD-10-CM

## 2014-07-31 LAB — POCT INR: INR: 2.3

## 2014-08-14 ENCOUNTER — Ambulatory Visit (INDEPENDENT_AMBULATORY_CARE_PROVIDER_SITE_OTHER): Payer: Medicare Other | Admitting: *Deleted

## 2014-08-14 DIAGNOSIS — Z954 Presence of other heart-valve replacement: Secondary | ICD-10-CM

## 2014-08-14 DIAGNOSIS — Z5181 Encounter for therapeutic drug level monitoring: Secondary | ICD-10-CM

## 2014-08-14 DIAGNOSIS — Z952 Presence of prosthetic heart valve: Secondary | ICD-10-CM

## 2014-08-14 DIAGNOSIS — I059 Rheumatic mitral valve disease, unspecified: Secondary | ICD-10-CM

## 2014-08-14 LAB — POCT INR: INR: 3.3

## 2014-08-16 ENCOUNTER — Telehealth: Payer: Self-pay

## 2014-08-16 NOTE — Telephone Encounter (Signed)
Received lab work from [place]. Per Dr. Radford Pax, called patient to follow-up and see how he is doing since his BNP was elevated. Patient st he feels great and has no complaints. Confirmed OV at the end of July and instructed the patient to call if he needed anything before then.

## 2014-08-23 DIAGNOSIS — I739 Peripheral vascular disease, unspecified: Secondary | ICD-10-CM | POA: Diagnosis not present

## 2014-08-23 DIAGNOSIS — M79671 Pain in right foot: Secondary | ICD-10-CM | POA: Diagnosis not present

## 2014-08-23 DIAGNOSIS — E1151 Type 2 diabetes mellitus with diabetic peripheral angiopathy without gangrene: Secondary | ICD-10-CM | POA: Diagnosis not present

## 2014-08-23 DIAGNOSIS — L97519 Non-pressure chronic ulcer of other part of right foot with unspecified severity: Secondary | ICD-10-CM | POA: Diagnosis not present

## 2014-08-23 DIAGNOSIS — L97529 Non-pressure chronic ulcer of other part of left foot with unspecified severity: Secondary | ICD-10-CM | POA: Diagnosis not present

## 2014-08-23 DIAGNOSIS — L84 Corns and callosities: Secondary | ICD-10-CM | POA: Diagnosis not present

## 2014-08-23 DIAGNOSIS — M79672 Pain in left foot: Secondary | ICD-10-CM | POA: Diagnosis not present

## 2014-08-23 DIAGNOSIS — L603 Nail dystrophy: Secondary | ICD-10-CM | POA: Diagnosis not present

## 2014-08-30 DIAGNOSIS — L97529 Non-pressure chronic ulcer of other part of left foot with unspecified severity: Secondary | ICD-10-CM | POA: Diagnosis not present

## 2014-08-30 DIAGNOSIS — L97519 Non-pressure chronic ulcer of other part of right foot with unspecified severity: Secondary | ICD-10-CM | POA: Diagnosis not present

## 2014-09-04 ENCOUNTER — Ambulatory Visit (INDEPENDENT_AMBULATORY_CARE_PROVIDER_SITE_OTHER): Payer: Medicare Other | Admitting: *Deleted

## 2014-09-04 DIAGNOSIS — Z5181 Encounter for therapeutic drug level monitoring: Secondary | ICD-10-CM

## 2014-09-04 DIAGNOSIS — Z954 Presence of other heart-valve replacement: Secondary | ICD-10-CM | POA: Diagnosis not present

## 2014-09-04 DIAGNOSIS — Z952 Presence of prosthetic heart valve: Secondary | ICD-10-CM

## 2014-09-04 DIAGNOSIS — I059 Rheumatic mitral valve disease, unspecified: Secondary | ICD-10-CM | POA: Diagnosis not present

## 2014-09-04 LAB — POCT INR: INR: 2.8

## 2014-09-06 DIAGNOSIS — L97519 Non-pressure chronic ulcer of other part of right foot with unspecified severity: Secondary | ICD-10-CM | POA: Diagnosis not present

## 2014-09-06 DIAGNOSIS — L97529 Non-pressure chronic ulcer of other part of left foot with unspecified severity: Secondary | ICD-10-CM | POA: Diagnosis not present

## 2014-09-20 DIAGNOSIS — L97519 Non-pressure chronic ulcer of other part of right foot with unspecified severity: Secondary | ICD-10-CM | POA: Diagnosis not present

## 2014-09-20 DIAGNOSIS — L97529 Non-pressure chronic ulcer of other part of left foot with unspecified severity: Secondary | ICD-10-CM | POA: Diagnosis not present

## 2014-10-02 ENCOUNTER — Encounter: Payer: Self-pay | Admitting: Cardiology

## 2014-10-02 ENCOUNTER — Ambulatory Visit (INDEPENDENT_AMBULATORY_CARE_PROVIDER_SITE_OTHER): Payer: Medicare Other | Admitting: Cardiology

## 2014-10-02 ENCOUNTER — Ambulatory Visit (INDEPENDENT_AMBULATORY_CARE_PROVIDER_SITE_OTHER): Payer: Medicare Other | Admitting: *Deleted

## 2014-10-02 VITALS — BP 110/52 | HR 58 | Ht 66.0 in | Wt 139.0 lb

## 2014-10-02 DIAGNOSIS — E785 Hyperlipidemia, unspecified: Secondary | ICD-10-CM

## 2014-10-02 DIAGNOSIS — I255 Ischemic cardiomyopathy: Secondary | ICD-10-CM | POA: Diagnosis not present

## 2014-10-02 DIAGNOSIS — I482 Chronic atrial fibrillation, unspecified: Secondary | ICD-10-CM

## 2014-10-02 DIAGNOSIS — I10 Essential (primary) hypertension: Secondary | ICD-10-CM

## 2014-10-02 DIAGNOSIS — I059 Rheumatic mitral valve disease, unspecified: Secondary | ICD-10-CM | POA: Diagnosis not present

## 2014-10-02 DIAGNOSIS — Z5181 Encounter for therapeutic drug level monitoring: Secondary | ICD-10-CM

## 2014-10-02 DIAGNOSIS — I4729 Other ventricular tachycardia: Secondary | ICD-10-CM

## 2014-10-02 DIAGNOSIS — Z954 Presence of other heart-valve replacement: Secondary | ICD-10-CM | POA: Diagnosis not present

## 2014-10-02 DIAGNOSIS — I420 Dilated cardiomyopathy: Secondary | ICD-10-CM

## 2014-10-02 DIAGNOSIS — Z952 Presence of prosthetic heart valve: Secondary | ICD-10-CM

## 2014-10-02 DIAGNOSIS — I472 Ventricular tachycardia: Secondary | ICD-10-CM

## 2014-10-02 DIAGNOSIS — I5022 Chronic systolic (congestive) heart failure: Secondary | ICD-10-CM

## 2014-10-02 LAB — POCT INR: INR: 3.8

## 2014-10-02 NOTE — Patient Instructions (Addendum)
Medication Instructions:  Your physician recommends that you continue on your current medications as directed. Please refer to the Current Medication list given to you today.   Labwork: FASTING labwork the SAME DAY AS YOUR COUMADIN CHECK: BMET, LFTs, Lipids  Testing/Procedures: None  Follow-Up: Your physician wants you to follow-up in: 6 months with Dr. Radford Pax. You will receive a reminder letter in the mail two months in advance. If you Mil[place]'t receive a letter, please call our office to schedule the follow-up appointment.   Any Other Special Instructions Will Be Listed Below (If Applicable).

## 2014-10-02 NOTE — Progress Notes (Signed)
Cardiology Office Note   Date:  10/02/2014   ID:  Alfred Dennis, DOB 06/30/1923, MRN [id number]  PCP:  Osborne Casco, MD    Chief Complaint  Patient presents with  . Follow-up    chronic atrial fib      History of Present Illness: Alfred Dennis is a 81 y.o. male with a history of severe MR s/p MVR, HTN, dyslipidemia, chronic atrial fibrillation, CAD s/p CABG and ischemic dilated CM with EF 35-30% at time of his colon surgery and chronic systolic CHF.   He has a history of VT but due to his age and comorbidities he was not felt to be a candidate for ICD. He denies any chest pain. He has chronic DOE which is unchanged. He denies any palpitations, dizziness or syncope. He occasionally has some mild RLE edema which is chronic. He is seeing a podiatrist for his feet and now wearing special shoes.     Past Medical History  Diagnosis Date  . Hypertension   . Shortness of breath     "w/any activity lately" (02/03/2012)  . CHF (congestive heart failure)     mild/note 02/03/2012  . Arthritis     "left foot; fingers" (02/03/2012)  . ARF (acute renal failure)   . GIB (gastrointestinal bleeding)   . Chronic atrial fibrillation   . Non-sustained ventricular tachycardia   . Chronic anticoagulation   . Coronary artery disease 05/2000    s/p CABG with LIMA to LAD, SVG to IM, SVG to OM  . Mitral valvular regurgitation     s/p MVR with mechanical valve  . Ischemic dilated cardiomyopathy     EF 25-30% by echo 01/2012 at time of colon surgery  . Hyperlipidemia   . Stroke     after CABG  . Carcinoma of colon   . Basal cell carcinoma of shoulder 10/2010    Past Surgical History  Procedure Laterality Date  . Inguinal hernia repair      right  . Cataract extraction w/ intraocular lens  implant, bilateral  ~ 2011  . Valve replacement  05/2000    St. Jude 47mm mechanical valve  . Cardiac valve replacement  05/2000    St. Jude 33mm  .  Esophagogastroduodenoscopy  02/08/2012    Procedure: ESOPHAGOGASTRODUODENOSCOPY (EGD);  Surgeon: Gatha Mayer, MD;  Location: [facility] ENDOSCOPY;  Service: Endoscopy;  Laterality: N/A;  . Colonoscopy  02/08/2012    Procedure: COLONOSCOPY;  Surgeon: Gatha Mayer, MD;  Location: [place];  Service: Endoscopy;  Laterality: N/A;  . Laparoscopic partial colectomy  02/10/2012    Procedure: LAPAROSCOPIC PARTIAL COLECTOMY;  Surgeon: Stark Klein, MD;  Location: [place];  Service: General;  Laterality: N/A;  . Coronary artery bypass graft      LIMA to LAD, SVG to IM, SVG to OM2  . Embolectomy Left 03/21/2014    Procedure: LEFT BRACHIAL EMBOLECTOMY;  Surgeon: Elam Dutch, MD;  Location: [facility] OR;  Service: Vascular;  Laterality: Left;     Current Outpatient Prescriptions  Medication Sig Dispense Refill  . acetaminophen (TYLENOL) 325 MG tablet Take 2 tablets (650 mg total) by mouth every 6 (six) hours as needed for mild pain (or Fever >/= 101).    . clobetasol cream (TEMOVATE) 0.05 % Apply 0.10 application topically daily.  2  . furosemide (LASIX) 40 MG tablet Take 1 tablet (40 mg total) by mouth  2 (two) times daily. 30 tablet   . hydrALAZINE (APRESOLINE) 25 MG tablet Take 25 mg by mouth 3 (three) times daily.     . metoprolol (LOPRESSOR) 50 MG tablet Take 100 mg by mouth 2 (two) times daily.    . Multiple Vitamin (MULTIVITAMIN WITH MINERALS) TABS Take 1 tablet by mouth daily.    . mupirocin cream (BACTROBAN) 2 % Apply topically daily. 15 g 0  . pantoprazole (PROTONIX) 40 MG tablet Take 40 mg by mouth daily.    . polyethylene glycol (MIRALAX / GLYCOLAX) packet Take 17 g by mouth daily as needed for mild constipation. 14 each 0  . potassium chloride (K-DUR) 10 MEQ tablet Take 1 tablet (10 mEq total) by mouth daily. 90 tablet 3  . simvastatin (ZOCOR) 10 MG tablet Take 10 mg by mouth at bedtime.    . SSD 1 % cream Apply 1 application topically daily.  1  . traMADol (ULTRAM) 50 MG tablet Take 1 tablet (50  mg total) by mouth every 8 (eight) hours as needed. 20 tablet 0  . warfarin (COUMADIN) 5 MG tablet TAKE AS DIRECTED BY COUMADIN CLINIC. 30 tablet 3   No current facility-administered medications for this visit.    Allergies:   Review of patient's allergies indicates no known allergies.    Social History:  The patient  reports that he quit smoking about 34 years ago. His smoking use included Cigarettes. He has a 20 pack-year smoking history. He has never used smokeless tobacco. He reports that he does not drink alcohol or use illicit drugs.   Family History:  The patient's family history includes Cancer - Prostate in his son; Heart disease in his mother and sister.    ROS:  Please see the history of present illness.   Otherwise, review of systems are positive for hearing loss.   All other systems are reviewed and negative.    PHYSICAL EXAM: VS:  BP 110/52 mmHg  Pulse 58  Ht 5\' 6"  (1.676 m)  Wt 139 lb (63.05 kg)  BMI 22.45 kg/m2  SpO2 92% , BMI Body mass index is 22.45 kg/(m^2). GEN: Well nourished, well developed, in no acute distress HEENT: normal Neck: no JVD, carotid bruits, or masses Cardiac: RRR; no murmurs, rubs, or gallops,no edema  Respiratory:  clear to auscultation bilaterally, normal work of breathing GI: soft, nontender, nondistended, + BS MS: no deformity or atrophy Skin: warm and dry, no rash Neuro:  Strength and sensation are intact Psych: euthymic mood, full affect   EKG:  EKG is not ordered today.    Recent Labs: 03/14/2014: Magnesium 2.0 03/22/2014: ALT 31 03/24/2014: Hemoglobin 11.1*; Platelets 203 04/02/2014: BUN 38*; Creatinine, Ser 1.43; Potassium 3.4*; Sodium 139    Lipid Panel    Component Value Date/Time   CHOL 125 09/11/2013 0826   TRIG 104.0 09/11/2013 0826   HDL 47.20 09/11/2013 0826   CHOLHDL 3 09/11/2013 0826   VLDL 20.8 09/11/2013 0826   LDLCALC 57 09/11/2013 0826      Wt Readings from Last 3 Encounters:  10/02/14 139 lb (63.05 kg)    04/12/14 147 lb (66.679 kg)  04/04/14 140 lb (63.504 kg)    ASSESSMENT AND PLAN  1. NSVT - not a candidate for ICD due to age and comorbidities - Continue BB 2. Chronic systolic HF - he appears euvolemic on exam today - continue PO lasix 40mg  BID - no ACEI given renal dysfunction - continue hydralazine TID and BB - check BMET  3. LE cellulitis - resolved 4. Chronic atrial fibrillation: rate controlled, on coumadin/BB 5. ICM - Echo 03/12/2013 EF 25-30%, diffuse hypokinesis, mechanical MV present, PA peak pressure 65mmHg 6. H/o St Jude mechanical MVR - on coumadin 7. HTN - controlled 8. HLD - at goal on statin.  Check FLP and ALT    Current medicines are reviewed at length with the patient today.  The patient does not have concerns regarding medicines.  The following changes have been made:  no change  Labs/ tests ordered today: See above Assessment and Plan No orders of the defined types were placed in this encounter.     Disposition:   FU with me in 6 months  Signed, Sueanne Margarita, MD  10/02/2014 2:57 PM    [facility] [place], [place], [place]  [address] Phone: [phone]; Fax: [phone]

## 2014-10-04 DIAGNOSIS — L97529 Non-pressure chronic ulcer of other part of left foot with unspecified severity: Secondary | ICD-10-CM | POA: Diagnosis not present

## 2014-10-04 DIAGNOSIS — L97519 Non-pressure chronic ulcer of other part of right foot with unspecified severity: Secondary | ICD-10-CM | POA: Diagnosis not present

## 2014-10-18 DIAGNOSIS — L03032 Cellulitis of left toe: Secondary | ICD-10-CM | POA: Diagnosis not present

## 2014-10-18 DIAGNOSIS — L97519 Non-pressure chronic ulcer of other part of right foot with unspecified severity: Secondary | ICD-10-CM | POA: Diagnosis not present

## 2014-10-23 ENCOUNTER — Other Ambulatory Visit (INDEPENDENT_AMBULATORY_CARE_PROVIDER_SITE_OTHER): Payer: Medicare Other | Admitting: *Deleted

## 2014-10-23 ENCOUNTER — Ambulatory Visit (INDEPENDENT_AMBULATORY_CARE_PROVIDER_SITE_OTHER): Payer: Medicare Other | Admitting: *Deleted

## 2014-10-23 DIAGNOSIS — I059 Rheumatic mitral valve disease, unspecified: Secondary | ICD-10-CM | POA: Diagnosis not present

## 2014-10-23 DIAGNOSIS — Z5181 Encounter for therapeutic drug level monitoring: Secondary | ICD-10-CM | POA: Diagnosis not present

## 2014-10-23 DIAGNOSIS — Z952 Presence of prosthetic heart valve: Secondary | ICD-10-CM

## 2014-10-23 DIAGNOSIS — I10 Essential (primary) hypertension: Secondary | ICD-10-CM | POA: Diagnosis not present

## 2014-10-23 DIAGNOSIS — E785 Hyperlipidemia, unspecified: Secondary | ICD-10-CM

## 2014-10-23 DIAGNOSIS — Z954 Presence of other heart-valve replacement: Secondary | ICD-10-CM

## 2014-10-23 LAB — HEPATIC FUNCTION PANEL
ALK PHOS: 47 U/L (ref 39–117)
ALT: 29 U/L (ref 0–53)
AST: 36 U/L (ref 0–37)
Albumin: 4.3 g/dL (ref 3.5–5.2)
BILIRUBIN TOTAL: 1 mg/dL (ref 0.2–1.2)
Bilirubin, Direct: 0.2 mg/dL (ref 0.0–0.3)
TOTAL PROTEIN: 6.8 g/dL (ref 6.0–8.3)

## 2014-10-23 LAB — BASIC METABOLIC PANEL
BUN: 26 mg/dL — ABNORMAL HIGH (ref 6–23)
CHLORIDE: 105 meq/L (ref 96–112)
CO2: 32 meq/L (ref 19–32)
Calcium: 9.6 mg/dL (ref 8.4–10.5)
Creatinine, Ser: 1.38 mg/dL (ref 0.40–1.50)
GFR: 51.6 mL/min — ABNORMAL LOW (ref >60.00)
Glucose, Bld: 110 mg/dL — ABNORMAL HIGH (ref 70–99)
Potassium: 4.3 mEq/L (ref 3.5–5.1)
Sodium: 142 mEq/L (ref 135–145)

## 2014-10-23 LAB — POCT INR: INR: 3.6

## 2014-10-23 LAB — LIPID PANEL
CHOL/HDL RATIO: 3
Cholesterol: 110 mg/dL (ref 0–200)
HDL: 41.3 mg/dL (ref >39.00)
LDL CALC: 46 mg/dL (ref 0–99)
NONHDL: 68.37
Triglycerides: 112 mg/dL (ref 0.0–149.0)
VLDL: 22.4 mg/dL (ref 0.0–40.0)

## 2014-11-01 DIAGNOSIS — L97519 Non-pressure chronic ulcer of other part of right foot with unspecified severity: Secondary | ICD-10-CM | POA: Diagnosis not present

## 2014-11-01 DIAGNOSIS — L97529 Non-pressure chronic ulcer of other part of left foot with unspecified severity: Secondary | ICD-10-CM | POA: Diagnosis not present

## 2014-11-06 ENCOUNTER — Ambulatory Visit (INDEPENDENT_AMBULATORY_CARE_PROVIDER_SITE_OTHER): Payer: Medicare Other | Admitting: Pharmacist

## 2014-11-06 DIAGNOSIS — Z952 Presence of prosthetic heart valve: Secondary | ICD-10-CM

## 2014-11-06 DIAGNOSIS — Z954 Presence of other heart-valve replacement: Secondary | ICD-10-CM | POA: Diagnosis not present

## 2014-11-06 DIAGNOSIS — I059 Rheumatic mitral valve disease, unspecified: Secondary | ICD-10-CM

## 2014-11-06 DIAGNOSIS — Z5181 Encounter for therapeutic drug level monitoring: Secondary | ICD-10-CM | POA: Diagnosis not present

## 2014-11-06 LAB — POCT INR: INR: 2.8

## 2014-11-15 DIAGNOSIS — L97529 Non-pressure chronic ulcer of other part of left foot with unspecified severity: Secondary | ICD-10-CM | POA: Diagnosis not present

## 2014-11-15 DIAGNOSIS — I739 Peripheral vascular disease, unspecified: Secondary | ICD-10-CM | POA: Diagnosis not present

## 2014-11-15 DIAGNOSIS — L603 Nail dystrophy: Secondary | ICD-10-CM | POA: Diagnosis not present

## 2014-11-15 DIAGNOSIS — L97519 Non-pressure chronic ulcer of other part of right foot with unspecified severity: Secondary | ICD-10-CM | POA: Diagnosis not present

## 2014-11-21 ENCOUNTER — Other Ambulatory Visit: Payer: Self-pay | Admitting: Cardiology

## 2014-11-27 ENCOUNTER — Ambulatory Visit (INDEPENDENT_AMBULATORY_CARE_PROVIDER_SITE_OTHER): Payer: Medicare Other

## 2014-11-27 DIAGNOSIS — Z952 Presence of prosthetic heart valve: Secondary | ICD-10-CM

## 2014-11-27 DIAGNOSIS — I059 Rheumatic mitral valve disease, unspecified: Secondary | ICD-10-CM | POA: Diagnosis not present

## 2014-11-27 DIAGNOSIS — Z954 Presence of other heart-valve replacement: Secondary | ICD-10-CM

## 2014-11-27 DIAGNOSIS — Z5181 Encounter for therapeutic drug level monitoring: Secondary | ICD-10-CM

## 2014-11-27 LAB — POCT INR: INR: 3.1

## 2014-11-29 DIAGNOSIS — L97519 Non-pressure chronic ulcer of other part of right foot with unspecified severity: Secondary | ICD-10-CM | POA: Diagnosis not present

## 2014-11-29 DIAGNOSIS — L97529 Non-pressure chronic ulcer of other part of left foot with unspecified severity: Secondary | ICD-10-CM | POA: Diagnosis not present

## 2014-12-20 DIAGNOSIS — L97519 Non-pressure chronic ulcer of other part of right foot with unspecified severity: Secondary | ICD-10-CM | POA: Diagnosis not present

## 2014-12-20 DIAGNOSIS — L97529 Non-pressure chronic ulcer of other part of left foot with unspecified severity: Secondary | ICD-10-CM | POA: Diagnosis not present

## 2014-12-25 ENCOUNTER — Ambulatory Visit (INDEPENDENT_AMBULATORY_CARE_PROVIDER_SITE_OTHER): Payer: Medicare Other

## 2014-12-25 DIAGNOSIS — Z5181 Encounter for therapeutic drug level monitoring: Secondary | ICD-10-CM | POA: Diagnosis not present

## 2014-12-25 DIAGNOSIS — Z954 Presence of other heart-valve replacement: Secondary | ICD-10-CM | POA: Diagnosis not present

## 2014-12-25 DIAGNOSIS — I059 Rheumatic mitral valve disease, unspecified: Secondary | ICD-10-CM | POA: Diagnosis not present

## 2014-12-25 DIAGNOSIS — Z952 Presence of prosthetic heart valve: Secondary | ICD-10-CM

## 2014-12-25 LAB — POCT INR: INR: 3.1

## 2015-01-10 ENCOUNTER — Encounter: Payer: Self-pay | Admitting: Cardiology

## 2015-01-10 DIAGNOSIS — L97519 Non-pressure chronic ulcer of other part of right foot with unspecified severity: Secondary | ICD-10-CM | POA: Diagnosis not present

## 2015-01-10 DIAGNOSIS — L03116 Cellulitis of left lower limb: Secondary | ICD-10-CM | POA: Diagnosis not present

## 2015-01-10 DIAGNOSIS — T8189XA Other complications of procedures, not elsewhere classified, initial encounter: Secondary | ICD-10-CM | POA: Diagnosis not present

## 2015-01-17 DIAGNOSIS — L97519 Non-pressure chronic ulcer of other part of right foot with unspecified severity: Secondary | ICD-10-CM | POA: Diagnosis not present

## 2015-01-17 DIAGNOSIS — L97529 Non-pressure chronic ulcer of other part of left foot with unspecified severity: Secondary | ICD-10-CM | POA: Diagnosis not present

## 2015-01-24 DIAGNOSIS — L97529 Non-pressure chronic ulcer of other part of left foot with unspecified severity: Secondary | ICD-10-CM | POA: Diagnosis not present

## 2015-01-24 DIAGNOSIS — L603 Nail dystrophy: Secondary | ICD-10-CM | POA: Diagnosis not present

## 2015-01-24 DIAGNOSIS — E1151 Type 2 diabetes mellitus with diabetic peripheral angiopathy without gangrene: Secondary | ICD-10-CM | POA: Diagnosis not present

## 2015-01-24 DIAGNOSIS — I739 Peripheral vascular disease, unspecified: Secondary | ICD-10-CM | POA: Diagnosis not present

## 2015-01-24 DIAGNOSIS — L97519 Non-pressure chronic ulcer of other part of right foot with unspecified severity: Secondary | ICD-10-CM | POA: Diagnosis not present

## 2015-02-05 ENCOUNTER — Ambulatory Visit (INDEPENDENT_AMBULATORY_CARE_PROVIDER_SITE_OTHER): Payer: Medicare Other

## 2015-02-05 DIAGNOSIS — Z5181 Encounter for therapeutic drug level monitoring: Secondary | ICD-10-CM

## 2015-02-05 DIAGNOSIS — Z952 Presence of prosthetic heart valve: Secondary | ICD-10-CM

## 2015-02-05 DIAGNOSIS — Z954 Presence of other heart-valve replacement: Secondary | ICD-10-CM | POA: Diagnosis not present

## 2015-02-05 DIAGNOSIS — I059 Rheumatic mitral valve disease, unspecified: Secondary | ICD-10-CM | POA: Diagnosis not present

## 2015-02-05 LAB — POCT INR: INR: 3.4

## 2015-02-07 DIAGNOSIS — M25572 Pain in left ankle and joints of left foot: Secondary | ICD-10-CM | POA: Diagnosis not present

## 2015-03-05 DIAGNOSIS — Z23 Encounter for immunization: Secondary | ICD-10-CM | POA: Diagnosis not present

## 2015-03-20 ENCOUNTER — Ambulatory Visit (INDEPENDENT_AMBULATORY_CARE_PROVIDER_SITE_OTHER): Payer: Medicare Other | Admitting: Cardiology

## 2015-03-20 ENCOUNTER — Ambulatory Visit (INDEPENDENT_AMBULATORY_CARE_PROVIDER_SITE_OTHER): Payer: Medicare Other | Admitting: *Deleted

## 2015-03-20 ENCOUNTER — Encounter: Payer: Self-pay | Admitting: Cardiology

## 2015-03-20 VITALS — BP 120/64 | HR 68 | Ht 66.0 in | Wt 143.6 lb

## 2015-03-20 DIAGNOSIS — I482 Chronic atrial fibrillation, unspecified: Secondary | ICD-10-CM

## 2015-03-20 DIAGNOSIS — Z954 Presence of other heart-valve replacement: Secondary | ICD-10-CM

## 2015-03-20 DIAGNOSIS — I10 Essential (primary) hypertension: Secondary | ICD-10-CM

## 2015-03-20 DIAGNOSIS — I5022 Chronic systolic (congestive) heart failure: Secondary | ICD-10-CM

## 2015-03-20 DIAGNOSIS — Z952 Presence of prosthetic heart valve: Secondary | ICD-10-CM

## 2015-03-20 DIAGNOSIS — I255 Ischemic cardiomyopathy: Secondary | ICD-10-CM | POA: Diagnosis not present

## 2015-03-20 DIAGNOSIS — I4729 Other ventricular tachycardia: Secondary | ICD-10-CM

## 2015-03-20 DIAGNOSIS — I4891 Unspecified atrial fibrillation: Secondary | ICD-10-CM | POA: Diagnosis not present

## 2015-03-20 DIAGNOSIS — I420 Dilated cardiomyopathy: Secondary | ICD-10-CM

## 2015-03-20 DIAGNOSIS — E785 Hyperlipidemia, unspecified: Secondary | ICD-10-CM

## 2015-03-20 DIAGNOSIS — I472 Ventricular tachycardia: Secondary | ICD-10-CM

## 2015-03-20 DIAGNOSIS — I059 Rheumatic mitral valve disease, unspecified: Secondary | ICD-10-CM

## 2015-03-20 DIAGNOSIS — Z5181 Encounter for therapeutic drug level monitoring: Secondary | ICD-10-CM

## 2015-03-20 LAB — BASIC METABOLIC PANEL
BUN: 31 mg/dL — ABNORMAL HIGH (ref 7–25)
CO2: 26 mmol/L (ref 20–31)
Calcium: 9.1 mg/dL (ref 8.6–10.3)
Chloride: 106 mmol/L (ref 98–110)
Creat: 1.53 mg/dL — ABNORMAL HIGH (ref 0.70–1.11)
Glucose, Bld: 88 mg/dL (ref 65–99)
Potassium: 4.2 mmol/L (ref 3.5–5.3)
SODIUM: 142 mmol/L (ref 135–146)

## 2015-03-20 LAB — POCT INR: INR: 4.1

## 2015-03-20 NOTE — Progress Notes (Signed)
Cardiology Office Note   Date:  03/20/2015   ID:  Alfred Dennis, DOB [birth year], MRN [id number]  PCP:  Osborne Casco, MD    Chief Complaint  Patient presents with  . Mitral Regurgitation  . Congestive Heart Failure  . Atrial Fibrillation      History of Present Illness: Alfred Dennis is a 82 y.o. male with a history of severe MR s/p MVR, HTN, dyslipidemia, chronic atrial fibrillation, CAD s/p CABG and ischemic dilated CM with EF 35-30% at time of his colon surgery and chronic systolic CHF. He has a history of VT but due to his age and comorbidities he was not felt to be a candidate for ICD.He is doing well today and denies any chest pain. He has chronic DOE which is unchanged. He denies any palpitations, dizziness or syncope. He occasionally has some mild RLE edema which is chronic but has decreased since I saw him last. He has no claudications symptoms.    Past Medical History  Diagnosis Date  . Hypertension   . Shortness of breath     "w/any activity lately" (02/03/2012)  . CHF (congestive heart failure) (Alfred Dennis)     mild/note 02/03/2012  . Arthritis     "left foot; fingers" (02/03/2012)  . ARF (acute renal failure) (Alfred Dennis)   . GIB (gastrointestinal bleeding)   . Chronic atrial fibrillation ([name])   . Non-sustained ventricular tachycardia (Alfred Dennis)   . Chronic anticoagulation   . Coronary artery disease 05/2000    s/p CABG with LIMA to LAD, SVG to IM, SVG to OM  . Mitral valvular regurgitation     s/p MVR with mechanical valve  . Ischemic dilated cardiomyopathy     EF 25-30% by echo 01/2012 at time of colon surgery  . Hyperlipidemia   . Stroke [facility])     after CABG  . Carcinoma of colon (Alfred Dennis)   . Basal cell carcinoma of shoulder 10/2010    Past Surgical History  Procedure Laterality Date  . Inguinal hernia repair      right  . Cataract extraction w/ intraocular lens  implant, bilateral  ~ 2011  . Valve replacement  05/2000    St. Jude  40mm mechanical valve  . Cardiac valve replacement  05/2000    St. Jude 15mm  . Esophagogastroduodenoscopy  02/08/2012    Procedure: ESOPHAGOGASTRODUODENOSCOPY (EGD);  Surgeon: Gatha Mayer, MD;  Location: [facility] ENDOSCOPY;  Service: Endoscopy;  Laterality: N/A;  . Colonoscopy  02/08/2012    Procedure: COLONOSCOPY;  Surgeon: Gatha Mayer, MD;  Location: [place];  Service: Endoscopy;  Laterality: N/A;  . Laparoscopic partial colectomy  02/10/2012    Procedure: LAPAROSCOPIC PARTIAL COLECTOMY;  Surgeon: Stark Klein, MD;  Location: [place];  Service: General;  Laterality: N/A;  . Coronary artery bypass graft      LIMA to LAD, SVG to IM, SVG to OM2  . Embolectomy Left 03/21/2014    Procedure: LEFT BRACHIAL EMBOLECTOMY;  Surgeon: Elam Dutch, MD;  Location: [facility] OR;  Service: Vascular;  Laterality: Left;     Current Outpatient Prescriptions  Medication Sig Dispense Refill  . acetaminophen (TYLENOL) 325 MG tablet Take 2 tablets (650 mg total) by mouth every 6 (six) hours as needed for mild pain (or Fever >/= 101).    . clobetasol cream (TEMOVATE) 0.05 % Apply [id number] application topically daily.  2  . furosemide (LASIX)  40 MG tablet Take 1 tablet (40 mg total) by mouth 2 (two) times daily. 30 tablet   . hydrALAZINE (APRESOLINE) 25 MG tablet Take 25 mg by mouth 3 (three) times daily.     . metoprolol (LOPRESSOR) 50 MG tablet Take 100 mg by mouth 2 (two) times daily.    . Multiple Vitamin (MULTIVITAMIN WITH MINERALS) TABS Take 1 tablet by mouth daily.    . mupirocin cream (BACTROBAN) 2 % Apply 1 application topically daily.    . pantoprazole (PROTONIX) 40 MG tablet Take 40 mg by mouth daily.    . polyethylene glycol (MIRALAX / GLYCOLAX) packet Take 17 g by mouth daily as needed for mild constipation. 14 each 0  . potassium chloride (K-DUR) 10 MEQ tablet Take 1 tablet (10 mEq total) by mouth daily. 90 tablet 3  . silver sulfADIAZINE (SILVADENE) 1 % cream Apply 1 application topically daily.    .  simvastatin (ZOCOR) 10 MG tablet Take 10 mg by mouth at bedtime.    . traMADol (ULTRAM) 50 MG tablet Take 1 tablet (50 mg total) by mouth every 8 (eight) hours as needed. 20 tablet 0  . warfarin (COUMADIN) 5 MG tablet TAKE AS DIRECTED BY COUMADIN CLINIC 30 tablet 3   No current facility-administered medications for this visit.    Allergies:   Review of patient's allergies indicates no known allergies.    Social History:  The patient  reports that he quit smoking about 35 years ago. His smoking use included Cigarettes. He has a 20 pack-year smoking history. He has never used smokeless tobacco. He reports that he does not drink alcohol or use illicit drugs.   Family History:  The patient's family history includes Cancer - Prostate in his son; Heart disease in his mother and sister.    ROS:  Please see the history of present illness.   Otherwise, review of systems are positive for none.   All other systems are reviewed and negative.    PHYSICAL EXAM: VS:  BP 120/64 mmHg  Pulse 68  Ht 5\' 6"  (1.676 m)  Wt 143 lb 9.6 oz (65.137 kg)  BMI 23.19 kg/m2 , BMI Body mass index is 23.19 kg/(m^2). GEN: Well nourished, well developed, in no acute distress HEENT: normal Neck: no JVD, carotid bruits, or masses Cardiac: irregularly irregular; no murmurs, rubs, or gallops,no edema  Respiratory:  clear to auscultation bilaterally, normal work of breathing GI: soft, nontender, nondistended, + BS MS: no deformity or atrophy Skin: warm and dry, no rash Neuro:  Strength and sensation are intact Psych: euthymic mood, full affect   EKG:  EKG is ordered today. The ekg ordered today demonstrates atrial fibrillation with occasional PVC's and nonspecific IVCD with lateral T wave abnormality   Recent Labs: 03/24/2014: Hemoglobin 11.1*; Platelets 203 10/23/2014: ALT 29; BUN 26*; Creatinine, Ser 1.38; Potassium 4.3; Sodium 142    Lipid Panel    Component Value Date/Time   CHOL 110 10/23/2014 0822   TRIG  112.0 10/23/2014 0822   HDL 41.30 10/23/2014 0822   CHOLHDL 3 10/23/2014 0822   VLDL 22.4 10/23/2014 0822   LDLCALC 46 10/23/2014 0822      Wt Readings from Last 3 Encounters:  03/20/15 143 lb 9.6 oz (65.137 kg)  10/02/14 139 lb (63.05 kg)  04/12/14 147 lb (66.679 kg)    ASSESSMENT AND PLAN  1. NSVT - he has been deemed not to be a candidate for ICD due to age and comorbidities - Continue BB  2. Chronic systolic HF - he appears euvolemic on exam today - continue PO lasix 40mg  BID - no ACEI given renal dysfunction - continue hydralazine TID and BB - check BMET 3. LE cellulitis - resolved 4. Chronic atrial fibrillation: rate controlled, on coumadin/BB 5. ICM - Echo 03/12/2013 EF 25-30%, diffuse hypokinesis, mechanical MV present, PA peak pressure 37mmHg 6. H/o St Jude mechanical MVR - on coumadin.  No ASA due to history of GI bleed. 7. HTN - controlled 8. HLD - at goal on statin. I will get a copy of most recent lipids from PCP  Current medicines are reviewed at length with the patient today.  The patient does not have concerns regarding medicines.  The following changes have been made:  no change  Labs/ tests ordered today: See above Assessment and Plan No orders of the defined types were placed in this encounter.     Disposition:   FU with me in 6 months  Signed, Sueanne Margarita, MD  03/20/2015 2:52 PM    [facility] [address], [place], [place]  [address] Phone: [phone]; Fax: [phone]

## 2015-03-20 NOTE — Patient Instructions (Signed)
Medication Instructions:  Your physician recommends that you continue on your current medications as directed. Please refer to the Current Medication list given to you today.   Labwork: TODAY: BMET  Testing/Procedures: None  Follow-Up: Your physician wants you to follow-up in: 6 months with Dr. Turner. You will receive a reminder letter in the mail two months in advance. If you [place]'t receive a letter, please call our office to schedule the follow-up appointment.   Any Other Special Instructions Will Be Listed Below (If Applicable).     If you need a refill on your cardiac medications before your next appointment, please call your pharmacy.   

## 2015-03-21 DIAGNOSIS — L97519 Non-pressure chronic ulcer of other part of right foot with unspecified severity: Secondary | ICD-10-CM | POA: Diagnosis not present

## 2015-04-01 DIAGNOSIS — Z952 Presence of prosthetic heart valve: Secondary | ICD-10-CM | POA: Diagnosis not present

## 2015-04-01 DIAGNOSIS — I052 Rheumatic mitral stenosis with insufficiency: Secondary | ICD-10-CM | POA: Diagnosis not present

## 2015-04-01 DIAGNOSIS — N183 Chronic kidney disease, stage 3 (moderate): Secondary | ICD-10-CM | POA: Diagnosis not present

## 2015-04-01 DIAGNOSIS — I639 Cerebral infarction, unspecified: Secondary | ICD-10-CM | POA: Diagnosis not present

## 2015-04-01 DIAGNOSIS — I509 Heart failure, unspecified: Secondary | ICD-10-CM | POA: Diagnosis not present

## 2015-04-01 DIAGNOSIS — I2510 Atherosclerotic heart disease of native coronary artery without angina pectoris: Secondary | ICD-10-CM | POA: Diagnosis not present

## 2015-04-01 DIAGNOSIS — I119 Hypertensive heart disease without heart failure: Secondary | ICD-10-CM | POA: Diagnosis not present

## 2015-04-01 DIAGNOSIS — I129 Hypertensive chronic kidney disease with stage 1 through stage 4 chronic kidney disease, or unspecified chronic kidney disease: Secondary | ICD-10-CM | POA: Diagnosis not present

## 2015-04-03 ENCOUNTER — Ambulatory Visit (INDEPENDENT_AMBULATORY_CARE_PROVIDER_SITE_OTHER): Payer: Medicare Other | Admitting: *Deleted

## 2015-04-03 DIAGNOSIS — Z952 Presence of prosthetic heart valve: Secondary | ICD-10-CM

## 2015-04-03 DIAGNOSIS — Z954 Presence of other heart-valve replacement: Secondary | ICD-10-CM

## 2015-04-03 DIAGNOSIS — I059 Rheumatic mitral valve disease, unspecified: Secondary | ICD-10-CM

## 2015-04-03 DIAGNOSIS — Z5181 Encounter for therapeutic drug level monitoring: Secondary | ICD-10-CM

## 2015-04-03 LAB — POCT INR: INR: 3

## 2015-04-04 DIAGNOSIS — M2011 Hallux valgus (acquired), right foot: Secondary | ICD-10-CM | POA: Diagnosis not present

## 2015-04-04 DIAGNOSIS — L97519 Non-pressure chronic ulcer of other part of right foot with unspecified severity: Secondary | ICD-10-CM | POA: Diagnosis not present

## 2015-04-04 DIAGNOSIS — M2012 Hallux valgus (acquired), left foot: Secondary | ICD-10-CM | POA: Diagnosis not present

## 2015-04-18 DIAGNOSIS — L97519 Non-pressure chronic ulcer of other part of right foot with unspecified severity: Secondary | ICD-10-CM | POA: Diagnosis not present

## 2015-04-18 DIAGNOSIS — L97529 Non-pressure chronic ulcer of other part of left foot with unspecified severity: Secondary | ICD-10-CM | POA: Diagnosis not present

## 2015-04-24 ENCOUNTER — Ambulatory Visit (INDEPENDENT_AMBULATORY_CARE_PROVIDER_SITE_OTHER): Payer: Medicare Other | Admitting: *Deleted

## 2015-04-24 DIAGNOSIS — Z954 Presence of other heart-valve replacement: Secondary | ICD-10-CM

## 2015-04-24 DIAGNOSIS — I059 Rheumatic mitral valve disease, unspecified: Secondary | ICD-10-CM

## 2015-04-24 DIAGNOSIS — Z5181 Encounter for therapeutic drug level monitoring: Secondary | ICD-10-CM | POA: Diagnosis not present

## 2015-04-24 DIAGNOSIS — Z952 Presence of prosthetic heart valve: Secondary | ICD-10-CM

## 2015-04-24 LAB — POCT INR: INR: 3

## 2015-05-02 DIAGNOSIS — L97519 Non-pressure chronic ulcer of other part of right foot with unspecified severity: Secondary | ICD-10-CM | POA: Diagnosis not present

## 2015-05-17 DIAGNOSIS — L97519 Non-pressure chronic ulcer of other part of right foot with unspecified severity: Secondary | ICD-10-CM | POA: Diagnosis not present

## 2015-05-22 ENCOUNTER — Ambulatory Visit (INDEPENDENT_AMBULATORY_CARE_PROVIDER_SITE_OTHER): Payer: Medicare Other | Admitting: Pharmacist

## 2015-05-22 DIAGNOSIS — Z5181 Encounter for therapeutic drug level monitoring: Secondary | ICD-10-CM | POA: Diagnosis not present

## 2015-05-22 DIAGNOSIS — Z952 Presence of prosthetic heart valve: Secondary | ICD-10-CM

## 2015-05-22 DIAGNOSIS — I059 Rheumatic mitral valve disease, unspecified: Secondary | ICD-10-CM

## 2015-05-22 DIAGNOSIS — Z954 Presence of other heart-valve replacement: Secondary | ICD-10-CM

## 2015-05-22 LAB — POCT INR: INR: 3.6

## 2015-05-30 DIAGNOSIS — L97519 Non-pressure chronic ulcer of other part of right foot with unspecified severity: Secondary | ICD-10-CM | POA: Diagnosis not present

## 2015-06-05 ENCOUNTER — Emergency Department (HOSPITAL_COMMUNITY): Payer: Medicare Other

## 2015-06-05 ENCOUNTER — Inpatient Hospital Stay (HOSPITAL_COMMUNITY)
Admission: EM | Admit: 2015-06-05 | Discharge: 2015-06-11 | DRG: 871 | Disposition: A | Discharge status: Home Health | Payer: Medicare Other | Attending: Internal Medicine | Admitting: Internal Medicine

## 2015-06-05 ENCOUNTER — Encounter (HOSPITAL_COMMUNITY): Payer: Self-pay | Admitting: Emergency Medicine

## 2015-06-05 DIAGNOSIS — R404 Transient alteration of awareness: Secondary | ICD-10-CM | POA: Diagnosis not present

## 2015-06-05 DIAGNOSIS — I482 Chronic atrial fibrillation: Secondary | ICD-10-CM | POA: Diagnosis not present

## 2015-06-05 DIAGNOSIS — R509 Fever, unspecified: Secondary | ICD-10-CM

## 2015-06-05 DIAGNOSIS — I10 Essential (primary) hypertension: Secondary | ICD-10-CM | POA: Diagnosis present

## 2015-06-05 DIAGNOSIS — Z7901 Long term (current) use of anticoagulants: Secondary | ICD-10-CM

## 2015-06-05 DIAGNOSIS — R531 Weakness: Secondary | ICD-10-CM | POA: Diagnosis not present

## 2015-06-05 DIAGNOSIS — Z951 Presence of aortocoronary bypass graft: Secondary | ICD-10-CM

## 2015-06-05 DIAGNOSIS — I248 Other forms of acute ischemic heart disease: Secondary | ICD-10-CM | POA: Diagnosis not present

## 2015-06-05 DIAGNOSIS — R7881 Bacteremia: Secondary | ICD-10-CM

## 2015-06-05 DIAGNOSIS — I2510 Atherosclerotic heart disease of native coronary artery without angina pectoris: Secondary | ICD-10-CM | POA: Diagnosis present

## 2015-06-05 DIAGNOSIS — K219 Gastro-esophageal reflux disease without esophagitis: Secondary | ICD-10-CM | POA: Diagnosis present

## 2015-06-05 DIAGNOSIS — L03115 Cellulitis of right lower limb: Secondary | ICD-10-CM

## 2015-06-05 DIAGNOSIS — I420 Dilated cardiomyopathy: Secondary | ICD-10-CM | POA: Diagnosis present

## 2015-06-05 DIAGNOSIS — Z8249 Family history of ischemic heart disease and other diseases of the circulatory system: Secondary | ICD-10-CM

## 2015-06-05 DIAGNOSIS — Z87891 Personal history of nicotine dependence: Secondary | ICD-10-CM

## 2015-06-05 DIAGNOSIS — Z9049 Acquired absence of other specified parts of digestive tract: Secondary | ICD-10-CM

## 2015-06-05 DIAGNOSIS — Z79899 Other long term (current) drug therapy: Secondary | ICD-10-CM

## 2015-06-05 DIAGNOSIS — A401 Sepsis due to streptococcus, group B: Secondary | ICD-10-CM | POA: Diagnosis not present

## 2015-06-05 DIAGNOSIS — Z8673 Personal history of transient ischemic attack (TIA), and cerebral infarction without residual deficits: Secondary | ICD-10-CM

## 2015-06-05 DIAGNOSIS — D649 Anemia, unspecified: Secondary | ICD-10-CM | POA: Diagnosis present

## 2015-06-05 DIAGNOSIS — J189 Pneumonia, unspecified organism: Secondary | ICD-10-CM | POA: Diagnosis present

## 2015-06-05 DIAGNOSIS — I4892 Unspecified atrial flutter: Secondary | ICD-10-CM | POA: Diagnosis present

## 2015-06-05 DIAGNOSIS — A419 Sepsis, unspecified organism: Secondary | ICD-10-CM

## 2015-06-05 DIAGNOSIS — N183 Chronic kidney disease, stage 3 (moderate): Secondary | ICD-10-CM | POA: Diagnosis not present

## 2015-06-05 DIAGNOSIS — Z85038 Personal history of other malignant neoplasm of large intestine: Secondary | ICD-10-CM

## 2015-06-05 DIAGNOSIS — E785 Hyperlipidemia, unspecified: Secondary | ICD-10-CM | POA: Diagnosis present

## 2015-06-05 DIAGNOSIS — R262 Difficulty in walking, not elsewhere classified: Secondary | ICD-10-CM | POA: Diagnosis present

## 2015-06-05 DIAGNOSIS — I255 Ischemic cardiomyopathy: Secondary | ICD-10-CM | POA: Diagnosis present

## 2015-06-05 DIAGNOSIS — I110 Hypertensive heart disease with heart failure: Secondary | ICD-10-CM | POA: Diagnosis not present

## 2015-06-05 DIAGNOSIS — R4182 Altered mental status, unspecified: Secondary | ICD-10-CM | POA: Diagnosis not present

## 2015-06-05 DIAGNOSIS — Z952 Presence of prosthetic heart valve: Secondary | ICD-10-CM

## 2015-06-05 DIAGNOSIS — M7989 Other specified soft tissue disorders: Secondary | ICD-10-CM | POA: Diagnosis present

## 2015-06-05 DIAGNOSIS — I472 Ventricular tachycardia: Secondary | ICD-10-CM | POA: Diagnosis present

## 2015-06-05 DIAGNOSIS — I481 Persistent atrial fibrillation: Secondary | ICD-10-CM | POA: Diagnosis present

## 2015-06-05 DIAGNOSIS — Z85828 Personal history of other malignant neoplasm of skin: Secondary | ICD-10-CM

## 2015-06-05 DIAGNOSIS — Z9842 Cataract extraction status, left eye: Secondary | ICD-10-CM

## 2015-06-05 DIAGNOSIS — Z9841 Cataract extraction status, right eye: Secondary | ICD-10-CM

## 2015-06-05 DIAGNOSIS — I509 Heart failure, unspecified: Secondary | ICD-10-CM

## 2015-06-05 DIAGNOSIS — I5023 Acute on chronic systolic (congestive) heart failure: Secondary | ICD-10-CM | POA: Diagnosis not present

## 2015-06-05 DIAGNOSIS — Z961 Presence of intraocular lens: Secondary | ICD-10-CM | POA: Diagnosis present

## 2015-06-05 DIAGNOSIS — I130 Hypertensive heart and chronic kidney disease with heart failure and stage 1 through stage 4 chronic kidney disease, or unspecified chronic kidney disease: Secondary | ICD-10-CM | POA: Diagnosis present

## 2015-06-05 DIAGNOSIS — S91301A Unspecified open wound, right foot, initial encounter: Secondary | ICD-10-CM | POA: Diagnosis present

## 2015-06-05 DIAGNOSIS — R791 Abnormal coagulation profile: Secondary | ICD-10-CM | POA: Diagnosis present

## 2015-06-05 DIAGNOSIS — X58XXXA Exposure to other specified factors, initial encounter: Secondary | ICD-10-CM | POA: Diagnosis present

## 2015-06-05 LAB — CBC WITH DIFFERENTIAL/PLATELET
BASOS ABS: 0 10*3/uL (ref 0.0–0.1)
Basophils Relative: 0 %
EOS PCT: 0 %
Eosinophils Absolute: 0 10*3/uL (ref 0.0–0.7)
HCT: 34.8 % — ABNORMAL LOW (ref 39.0–52.0)
Hemoglobin: 11.6 g/dL — ABNORMAL LOW (ref 13.0–17.0)
LYMPHS PCT: 3 %
Lymphs Abs: 0.5 10*3/uL — ABNORMAL LOW (ref 0.7–4.0)
MCH: 30 pg (ref 26.0–34.0)
MCHC: 33.3 g/dL (ref 30.0–36.0)
MCV: 89.9 fL (ref 78.0–100.0)
MONO ABS: 0.7 10*3/uL (ref 0.1–1.0)
Monocytes Relative: 4 %
Neutro Abs: 16.7 10*3/uL — ABNORMAL HIGH (ref 1.7–7.7)
Neutrophils Relative %: 93 %
Platelets: 171 10*3/uL (ref 150–400)
RBC: 3.87 MIL/uL — ABNORMAL LOW (ref 4.22–5.81)
RDW: 14.6 % (ref 11.5–15.5)
WBC: 17.9 10*3/uL — ABNORMAL HIGH (ref 4.0–10.5)

## 2015-06-05 LAB — URINALYSIS, ROUTINE W REFLEX MICROSCOPIC
Bilirubin Urine: NEGATIVE
GLUCOSE, UA: NEGATIVE mg/dL
HGB URINE DIPSTICK: NEGATIVE
Ketones, ur: NEGATIVE mg/dL
Leukocytes, UA: NEGATIVE
Nitrite: NEGATIVE
PH: 5.5 (ref 5.0–8.0)
PROTEIN: 30 mg/dL — AB
SPECIFIC GRAVITY, URINE: 1.019 (ref 1.005–1.030)

## 2015-06-05 LAB — URINE MICROSCOPIC-ADD ON: SQUAMOUS EPITHELIAL / LPF: NONE SEEN

## 2015-06-05 LAB — COMPREHENSIVE METABOLIC PANEL
ALT: 45 U/L (ref 17–63)
AST: 73 U/L — AB (ref 15–41)
Albumin: 3.8 g/dL (ref 3.5–5.0)
Alkaline Phosphatase: 81 U/L (ref 38–126)
Anion gap: 10 (ref 5–15)
BUN: 32 mg/dL — ABNORMAL HIGH (ref 6–20)
CHLORIDE: 108 mmol/L (ref 101–111)
CO2: 22 mmol/L (ref 22–32)
Calcium: 9.1 mg/dL (ref 8.9–10.3)
Creatinine, Ser: 1.56 mg/dL — ABNORMAL HIGH (ref 0.61–1.24)
GFR, EST AFRICAN AMERICAN: 44 mL/min — AB (ref >60)
GFR, EST NON AFRICAN AMERICAN: 38 mL/min — AB (ref >60)
Glucose, Bld: 104 mg/dL — ABNORMAL HIGH (ref 65–99)
POTASSIUM: 4 mmol/L (ref 3.5–5.1)
SODIUM: 140 mmol/L (ref 135–145)
Total Bilirubin: 1.2 mg/dL (ref 0.3–1.2)
Total Protein: 6.4 g/dL — ABNORMAL LOW (ref 6.5–8.1)

## 2015-06-05 LAB — PROTIME-INR
INR: 3.3 — ABNORMAL HIGH (ref 0.00–1.49)
PROTHROMBIN TIME: 32.9 s — AB (ref 11.6–15.2)

## 2015-06-05 LAB — BRAIN NATRIURETIC PEPTIDE: B NATRIURETIC PEPTIDE 5: 1585.8 pg/mL — AB (ref 0.0–100.0)

## 2015-06-05 MED ORDER — FUROSEMIDE 10 MG/ML IJ SOLN
40.0000 mg | Freq: Once | INTRAMUSCULAR | Status: AC
  Administered 2015-06-05: 40 mg via INTRAVENOUS
  Filled 2015-06-05: qty 4

## 2015-06-05 MED ORDER — ACETAMINOPHEN 500 MG PO TABS
1000.0000 mg | ORAL_TABLET | Freq: Once | ORAL | Status: AC
  Administered 2015-06-05: 1000 mg via ORAL
  Filled 2015-06-05: qty 2

## 2015-06-05 MED ORDER — SODIUM CHLORIDE 0.9 % IV BOLUS (SEPSIS)
1000.0000 mL | Freq: Once | INTRAVENOUS | Status: AC
  Administered 2015-06-05: 1000 mL via INTRAVENOUS

## 2015-06-05 NOTE — ED Notes (Signed)
Bed: [facility] Expected date:  Expected time:  Means of arrival:  Comments: EMS -weakness

## 2015-06-05 NOTE — H&P (Addendum)
[facility] History and Physical  Alfred Dennis [id number] DOB: 21-Jan-1924 DOA: 06/05/2015  Referring physician: Dr. Tyrone Nine PCP: Osborne Casco, MD   Chief Complaint: generalized weakness  HPI:  Alfred Dennis is a 82 year old male with a past medical history significant for systolic CHF last EF [id number] in 03/2014, CAD s/p Cabg, CVA, HTN, severe MR s/p MVR, HLD,and chronic atrial fibrillation on warfarin; who presents with generalized complaints of weakness. Patient is somewhat of a poor historian. He reports that he's had difficulty ambulating. Report leg swelling and notes a wound of his right foot that a family member has been changing the dressings. Associated symptoms include reports of at least 2 loose stools per day recently. Patient denies any knowledge of the stools were black in color.  Upon admission patient was evaluated and seemed to have temperature 102.6F, WBC 17.9, hemoglobin 11.6, creatinine 1.56, BUN 32, and BNP 1585.8.CT imaging of the brain showed no acute abnormality. Chest x-ray showed stable cardiomegaly with mild congestive changes and initial urinalysis was negative for signs of infection.  Review of Systems  Constitutional: Positive for fever, chills and malaise/fatigue.  HENT: Positive for hearing loss. Negative for tinnitus.   Eyes: Negative for photophobia and pain.  Cardiovascular: Negative for chest pain and claudication.  Gastrointestinal: Positive for diarrhea (loose stools). Negative for constipation.  Genitourinary: Negative for urgency and frequency.  Musculoskeletal: Negative for falls and neck pain.  Skin: Negative for itching.       Positive for Wound to right foot  Neurological: Positive for weakness. Negative for speech change.       Gait disturbance  Endo/Heme/Allergies: Negative for environmental allergies and polydipsia.  Psychiatric/Behavioral: Positive for memory loss. Negative for substance abuse.     Past Medical History   Diagnosis Date  . Hypertension   . Shortness of breath     "w/any activity lately" (02/03/2012)  . CHF (congestive heart failure) ([place])     mild/note 02/03/2012  . Arthritis     "left foot; fingers" (02/03/2012)  . ARF (acute renal failure) ([place])   . GIB (gastrointestinal bleeding)   . Chronic atrial fibrillation ([place])   . Non-sustained ventricular tachycardia ([place])   . Chronic anticoagulation   . Coronary artery disease 05/2000    s/p CABG with LIMA to LAD, SVG to IM, SVG to OM  . Mitral valvular regurgitation     s/p MVR with mechanical valve  . Ischemic dilated cardiomyopathy     EF 25-30% by echo 01/2012 at time of colon surgery  . Hyperlipidemia   . Stroke [facility])     after CABG  . Carcinoma of colon ([place])   . Basal cell carcinoma of shoulder 10/2010     Past Surgical History  Procedure Laterality Date  . Inguinal hernia repair      right  . Cataract extraction w/ intraocular lens  implant, bilateral  ~ 2011  . Valve replacement  05/2000    St. Jude 37mm mechanical valve  . Cardiac valve replacement  05/2000    St. Jude 42mm  . Esophagogastroduodenoscopy  02/08/2012    Procedure: ESOPHAGOGASTRODUODENOSCOPY (EGD);  Surgeon: Gatha Mayer, MD;  Location: [facility] ENDOSCOPY;  Service: Endoscopy;  Laterality: N/A;  . Colonoscopy  02/08/2012    Procedure: COLONOSCOPY;  Surgeon: Gatha Mayer, MD;  Location: [place];  Service: Endoscopy;  Laterality: N/A;  . Laparoscopic partial colectomy  02/10/2012    Procedure: LAPAROSCOPIC PARTIAL COLECTOMY;  Surgeon: Stark Klein, MD;  Location: [facility]  OR;  Service: General;  Laterality: N/A;  . Coronary artery bypass graft      LIMA to LAD, SVG to IM, SVG to OM2  . Embolectomy Left 03/21/2014    Procedure: LEFT BRACHIAL EMBOLECTOMY;  Surgeon: Elam Dutch, MD;  Location: [place];  Service: Vascular;  Laterality: Left;      Social History:  reports that he quit smoking about 35 years ago. His smoking use included Cigarettes. He has a 20  pack-year smoking history. He has never used smokeless tobacco. He reports that he does not drink alcohol or use illicit drugs. *  No Known Allergies  Family History  Problem Relation Age of Onset  . Heart disease Mother   . Heart disease Sister   . Cancer - Prostate Son        Prior to Admission medications   Medication Sig Start Date End Date Taking? Authorizing Provider  acetaminophen (TYLENOL) 325 MG tablet Take 2 tablets (650 mg total) by mouth every 6 (six) hours as needed for mild pain (or Fever >/= 101). 03/14/14  Yes Srikar Janna Arch, MD  furosemide (LASIX) 40 MG tablet Take 1 tablet (40 mg total) by mouth 2 (two) times daily. 03/14/14  Yes Srikar Janna Arch, MD  hydrALAZINE (APRESOLINE) 25 MG tablet Take 25 mg by mouth 3 (three) times daily.    Yes Historical Provider, MD  metoprolol (LOPRESSOR) 50 MG tablet Take 100 mg by mouth 2 (two) times daily.   Yes Historical Provider, MD  Multiple Vitamin (MULTIVITAMIN WITH MINERALS) TABS Take 1 tablet by mouth daily.   Yes Historical Provider, MD  pantoprazole (PROTONIX) 40 MG tablet Take 40 mg by mouth daily.   Yes Historical Provider, MD  polyethylene glycol (MIRALAX / GLYCOLAX) packet Take 17 g by mouth daily as needed for mild constipation. 03/14/14  Yes Srikar Janna Arch, MD  senna-docusate (SENOKOT-S) 8.6-50 MG tablet Take 2 tablets by mouth 2 (two) times daily.   Yes Historical Provider, MD  simvastatin (ZOCOR) 10 MG tablet Take 10 mg by mouth at bedtime.   Yes Historical Provider, MD  warfarin (COUMADIN) 5 MG tablet Take 2.5 mg by mouth daily. Daily   Yes Historical Provider, MD  potassium chloride (K-DUR) 10 MEQ tablet Take 1 tablet (10 mEq total) by mouth daily. 04/03/14   Sueanne Margarita, MD  traMADol (ULTRAM) 50 MG tablet Take 1 tablet (50 mg total) by mouth every 8 (eight) hours as needed. 03/23/14   Samantha J Rhyne, PA-C  warfarin (COUMADIN) 5 MG tablet TAKE AS DIRECTED BY COUMADIN CLINIC 11/21/14   Sueanne Margarita, MD     Physical  Exam: Filed Vitals:   06/05/15 2000 06/05/15 2103 06/05/15 2153 06/05/15 2200  BP: 125/59 121/62 119/64 114/70  Pulse: 69 72 105 74  Temp:   98.9 F (37.2 C)   TempSrc:   Oral   Resp:   18   SpO2: 94% 98% 94% 96%     Constitutional: Vital signs reviewed. Patient is a elderly gentleman who is alert, but confused. Head: Normocephalic and atraumatic  Ear: TM normal bilaterally  Mouth: no erythema or exudates, MMM  Eyes: PERRL, EOMI, conjunctivae normal, No scleral icterus.  Neck: Supple, Trachea midline normal ROM, No JVD, mass, thyromegaly, or carotid bruit present.  Cardiovascular: irregular irregular rhythm Pulmonary/Chest: Good inspiratory and expiratory effort with intermittent crackles appreciated. No wheezes, rales, or rhonchi  Abdominal: Soft. Non-tender, non-distended, bowel sounds are normal, no masses, organomegaly, or guarding present.  GU: no CVA tenderness Musculoskeletal: No joint deformities, erythema, or stiffness, ROM full and no nontender Ext: no edema and no cyanosis, pulses palpable bilaterally (DP and PT)  Hematology: no cervical, inginal, or axillary adenopathy.  Neurological: Alert. Strenght is normal and symmetric bilaterally, cranial nerve II-XII are grossly intact, no focal motor deficit, sensory intact to light touch bilaterally.  Skin: Warm, mildly diaphoretic and intact. Superficial ulcer present of the right first toe Psychiatric: Somewhat confused with poor memory     Data Review   Micro Results No results found for this or any previous visit (from the past 240 hour(s)).  Radiology Reports Dg Chest 2 View  06/05/2015  CLINICAL DATA:  82 year old male with weakness EXAM: CHEST  2 VIEW COMPARISON:  Radiograph dated 03/21/2014 FINDINGS: There is stable moderate cardiomegaly. Bilateral central vascular and interstitial prominence most compatible with congestive changes. Pneumonia is less likely but not excluded. There is no focal consolidation, pleural  effusion, or pneumothorax. There is elevation of the left hemidiaphragm. Median sternotomy wires, mitral valve replacement, and CABG vascular clips noted. There is osteopenia with degenerative changes of the spine. No acute fracture. IMPRESSION: Stable moderate cardiomegaly with mild congestive changes. Electronically Signed   By: Anner Crete M.D.   On: 06/05/2015 21:01   Ct Head Wo Contrast  06/05/2015  CLINICAL DATA:  Altered mental status and difficulty ambulating EXAM: CT HEAD WITHOUT CONTRAST TECHNIQUE: Contiguous axial images were obtained from the base of the skull through the vertex without intravenous contrast. COMPARISON:  March 10, 2014 FINDINGS: Moderate diffuse atrophy is stable. There is no intracranial mass, hemorrhage, extra-axial fluid collection, or midline shift. There is evidence of a prior infarct in the left occipital lobe posteriorly with sparing of the calcarine cortex. There is patchy small vessel disease in the centra semiovale bilaterally. There is evidence of a prior small infarct in the left thalamus. No new gray-white compartment lesion is identified. No acute infarct evident. The bony calvarium appears intact. The mastoid air cells are clear. There is leftward deviation the nasal septum. No intraorbital lesions are evident. IMPRESSION: Stable atrophy with periventricular small vessel disease. Prior left occipital lobe infarct as well as prior small infarct in the left thalamus. No acute infarct evident. No hemorrhage or mass effect. There is leftward deviation of the nasal septum. Electronically Signed   By: Lowella Grip III M.D.   On: 06/05/2015 20:46     CBC  Recent Labs Lab 06/05/15 1959  WBC 17.9*  HGB 11.6*  HCT 34.8*  PLT 171  MCV 89.9  MCH 30.0  MCHC 33.3  RDW 14.6  LYMPHSABS 0.5*  MONOABS 0.7  EOSABS 0.0  BASOSABS 0.0    Chemistries   Recent Labs Lab 06/05/15 1959  NA 140  K 4.0  CL 108  CO2 22  GLUCOSE 104*  BUN 32*  CREATININE  1.56*  CALCIUM 9.1  AST 73*  ALT 45  ALKPHOS 81  BILITOT 1.2   ------------------------------------------------------------------------------------------------------------------ CrCl cannot be calculated (Unknown ideal weight.). ------------------------------------------------------------------------------------------------------------------ No results for input(s): HGBA1C in the last 72 hours. ------------------------------------------------------------------------------------------------------------------ No results for input(s): CHOL, HDL, LDLCALC, TRIG, CHOLHDL, LDLDIRECT in the last 72 hours. ------------------------------------------------------------------------------------------------------------------ No results for input(s): TSH, T4TOTAL, T3FREE, THYROIDAB in the last 72 hours.  Invalid input(s): FREET3 ------------------------------------------------------------------------------------------------------------------ No results for input(s): VITAMINB12, FOLATE, FERRITIN, TIBC, IRON, RETICCTPCT in the last 72 hours.  Coagulation profile  Recent Labs Lab 06/05/15 1959  INR 3.30*    No results for input(s): DDIMER  in the last 72 hours.  Cardiac Enzymes No results for input(s): CKMB, TROPONINI, MYOGLOBIN in the last 168 hours.  Invalid input(s): CK ------------------------------------------------------------------------------------------------------------------ Invalid input(s): POCBNP   CBG: No results for input(s): GLUCAP in the last 168 hours.     EKG: Independently reviewed. Atrial flutter with paired PVCs   Assessment/Plan Acute systolicCHF exacerbation: patient with reports of generalized fatigue and lower extremity swelling. 3+ pitting edema noted on the lower extremities on physical exam. BNP elevated at 1600.patient was given 40 mg of Lasix in the emergency department with blood pressures upper 90. - admit to a telemetry bed - strict I&Os and daily  weights -  Lasix 40 mg IV daily now,with adjustment to dose as needed  SIRS/sepsis of unknown source: Patient found to have WBC of 17.9 and fever of 102.55F on admission. Patient's initial urinalysis did not appear to show any acute signs of infection. Chest x-ray showing cardiomegaly with vascular congestion. - check blood cultures 2 - Empiric antibiotics of vancomycin and Zosyn - check lactic acid level stat  Chronic atrial flutter/fibrillation : Patient appears to be rate controlled with metoprolol - patient appears to be rate controlled at this time.  Supratherapeutic INR on chronic anticoagulation: INR was elevated at 3.3 on admission - hold warfarin dose tonight - Warfarin to be dosed by pharmacy  Anemia: hemoglobin 11.6 on admission - Continue to monitor  Essential hypertension - Held hydralazine and metoprolol during acute diuresis as initial blood pressures appeared to low  Hyperlipidemia -Continue simvastatin   Gerd  -Continue Protonix   Code Status:   full Family Communication: bedside Disposition Plan: admit   Total time spent 55 minutes.Greater than 50% of this time was spent in counseling, explanation of diagnosis, planning of further management, and coordination of care  [facility] Pager [phone]  If 7PM-7AM, please contact night-coverage [email or web address] Password [email or web address] 06/05/2015, 11:02 PM

## 2015-06-05 NOTE — ED Provider Notes (Signed)
CSN: [id number]     Arrival date & time 06/05/15  1919 History   First MD Initiated Contact with Patient 06/05/15 1934     Chief Complaint  Patient presents with  . Weakness     (Consider location/radiation/quality/duration/timing/severity/associated sxs/prior Treatment) Patient is a 82 y.o. male presenting with weakness. The history is provided by the patient.  Weakness   82 yo M With a chief complaint weakness. Patient is really unable to describe any more of his symptoms. When asked if he had fevers he said no. Then later in the same history session patient stated that he had been having the shakes and some hot flashes. Denies any other symptoms. Denies abdominal pain. Denies cough dysuria flank pain. Level V caveat dementia.  Past Medical History  Diagnosis Date  . Hypertension   . Shortness of breath     "w/any activity lately" (02/03/2012)  . CHF (congestive heart failure) ([place])     mild/note 02/03/2012  . Arthritis     "left foot; fingers" (02/03/2012)  . ARF (acute renal failure) ([place])   . GIB (gastrointestinal bleeding)   . Chronic atrial fibrillation ([place])   . Non-sustained ventricular tachycardia ([place])   . Chronic anticoagulation   . Coronary artery disease 05/2000    s/p CABG with LIMA to LAD, SVG to IM, SVG to OM  . Mitral valvular regurgitation     s/p MVR with mechanical valve  . Ischemic dilated cardiomyopathy     EF 25-30% by echo 01/2012 at time of colon surgery  . Hyperlipidemia   . Stroke [facility])     after CABG  . Carcinoma of colon ([place])   . Basal cell carcinoma of shoulder 10/2010   Past Surgical History  Procedure Laterality Date  . Inguinal hernia repair      right  . Cataract extraction w/ intraocular lens  implant, bilateral  ~ 2011  . Valve replacement  05/2000    St. Jude 18mm mechanical valve  . Cardiac valve replacement  05/2000    St. Jude 18mm  . Esophagogastroduodenoscopy  02/08/2012    Procedure: ESOPHAGOGASTRODUODENOSCOPY (EGD);   Surgeon: Gatha Mayer, MD;  Location: [facility] ENDOSCOPY;  Service: Endoscopy;  Laterality: N/A;  . Colonoscopy  02/08/2012    Procedure: COLONOSCOPY;  Surgeon: Gatha Mayer, MD;  Location: [place];  Service: Endoscopy;  Laterality: N/A;  . Laparoscopic partial colectomy  02/10/2012    Procedure: LAPAROSCOPIC PARTIAL COLECTOMY;  Surgeon: Stark Klein, MD;  Location: [place];  Service: General;  Laterality: N/A;  . Coronary artery bypass graft      LIMA to LAD, SVG to IM, SVG to OM2  . Embolectomy Left 03/21/2014    Procedure: LEFT BRACHIAL EMBOLECTOMY;  Surgeon: Elam Dutch, MD;  Location: [facility] OR;  Service: Vascular;  Laterality: Left;   Family History  Problem Relation Age of Onset  . Heart disease Mother   . Heart disease Sister   . Cancer - Prostate Son    Social History  Substance Use Topics  . Smoking status: Former Smoker -- 1.00 packs/day for 20 years    Types: Cigarettes    Quit date: 03/09/1980  . Smokeless tobacco: Never Used     Comment: 02/03/2012 "quit smoking in my 75's"  . Alcohol Use: No    Review of Systems  Unable to perform ROS: Dementia  Constitutional: Positive for fever and chills.  Neurological: Positive for weakness.      Allergies  Review of patient's  allergies indicates no known allergies.  Home Medications   Prior to Admission medications   Medication Sig Start Date End Date Taking? Authorizing Provider  acetaminophen (TYLENOL) 325 MG tablet Take 2 tablets (650 mg total) by mouth every 6 (six) hours as needed for mild pain (or Fever >/= 101). 03/14/14  Yes Srikar Janna Arch, MD  furosemide (LASIX) 40 MG tablet Take 1 tablet (40 mg total) by mouth 2 (two) times daily. 03/14/14  Yes Srikar Janna Arch, MD  hydrALAZINE (APRESOLINE) 25 MG tablet Take 25 mg by mouth 3 (three) times daily.    Yes Historical Provider, MD  metoprolol (LOPRESSOR) 50 MG tablet Take 100 mg by mouth 2 (two) times daily.   Yes Historical Provider, MD  Multiple Vitamin (MULTIVITAMIN  WITH MINERALS) TABS Take 1 tablet by mouth daily.   Yes Historical Provider, MD  pantoprazole (PROTONIX) 40 MG tablet Take 40 mg by mouth daily.   Yes Historical Provider, MD  polyethylene glycol (MIRALAX / GLYCOLAX) packet Take 17 g by mouth daily as needed for mild constipation. 03/14/14  Yes Srikar Janna Arch, MD  senna-docusate (SENOKOT-S) 8.6-50 MG tablet Take 2 tablets by mouth 2 (two) times daily.   Yes Historical Provider, MD  simvastatin (ZOCOR) 10 MG tablet Take 10 mg by mouth at bedtime.   Yes Historical Provider, MD  warfarin (COUMADIN) 5 MG tablet Take 2.5 mg by mouth daily. Daily   Yes Historical Provider, MD  potassium chloride (K-DUR) 10 MEQ tablet Take 1 tablet (10 mEq total) by mouth daily. 04/03/14   Sueanne Margarita, MD  traMADol (ULTRAM) 50 MG tablet Take 1 tablet (50 mg total) by mouth every 8 (eight) hours as needed. 03/23/14   Samantha J Rhyne, PA-C  warfarin (COUMADIN) 5 MG tablet TAKE AS DIRECTED BY COUMADIN CLINIC 11/21/14   Sueanne Margarita, MD   BP 114/70 mmHg  Pulse 74  Temp(Src) 98.9 F (37.2 C) (Oral)  Resp 18  SpO2 96% Physical Exam  Constitutional: He is oriented to person, place, and time. He appears well-developed and well-nourished.  Warm to touch  HENT:  Head: Normocephalic and atraumatic.  Eyes: EOM are normal. Pupils are equal, round, and reactive to light.  Neck: Normal range of motion. Neck supple. No JVD present.  Cardiovascular: Normal rate and regular rhythm.  Exam reveals no gallop and no friction rub.   No murmur heard. Pulmonary/Chest: No respiratory distress. He has no wheezes. He has no rales.  Abdominal: He exhibits no distension. There is no rebound and no guarding.  Musculoskeletal: Normal range of motion. He exhibits edema.  Neurological: He is alert and oriented to person, place, and time.  Skin: No rash noted. No pallor.  Psychiatric: He has a normal mood and affect. His behavior is normal.  Nursing note and vitals reviewed.   ED Course   Procedures (including critical care time) Labs Review Labs Reviewed  CBC WITH DIFFERENTIAL/PLATELET - Abnormal; Notable for the following:    WBC 17.9 (*)    RBC 3.87 (*)    Hemoglobin 11.6 (*)    HCT 34.8 (*)    Neutro Abs 16.7 (*)    Lymphs Abs 0.5 (*)    All other components within normal limits  COMPREHENSIVE METABOLIC PANEL - Abnormal; Notable for the following:    Glucose, Bld 104 (*)    BUN 32 (*)    Creatinine, Ser 1.56 (*)    Total Protein 6.4 (*)    AST 73 (*)  GFR calc non Af Amer 38 (*)    GFR calc Af Amer 44 (*)    All other components within normal limits  URINALYSIS, ROUTINE W REFLEX MICROSCOPIC (NOT AT [facility]) - Abnormal; Notable for the following:    APPearance CLOUDY (*)    Protein, ur 30 (*)    All other components within normal limits  BRAIN NATRIURETIC PEPTIDE - Abnormal; Notable for the following:    B Natriuretic Peptide 1585.8 (*)    All other components within normal limits  PROTIME-INR - Abnormal; Notable for the following:    Prothrombin Time 32.9 (*)    INR 3.30 (*)    All other components within normal limits  URINE MICROSCOPIC-ADD ON - Abnormal; Notable for the following:    Bacteria, UA RARE (*)    Casts GRANULAR CAST (*)    All other components within normal limits  INFLUENZA PANEL BY PCR (TYPE A & B, H1N1)    Imaging Review Dg Chest 2 View  06/05/2015  CLINICAL DATA:  82 year old male with weakness EXAM: CHEST  2 VIEW COMPARISON:  Radiograph dated 03/21/2014 FINDINGS: There is stable moderate cardiomegaly. Bilateral central vascular and interstitial prominence most compatible with congestive changes. Pneumonia is less likely but not excluded. There is no focal consolidation, pleural effusion, or pneumothorax. There is elevation of the left hemidiaphragm. Median sternotomy wires, mitral valve replacement, and CABG vascular clips noted. There is osteopenia with degenerative changes of the spine. No acute fracture. IMPRESSION: Stable moderate  cardiomegaly with mild congestive changes. Electronically Signed   By: Anner Crete M.D.   On: 06/05/2015 21:01   Ct Head Wo Contrast  06/05/2015  CLINICAL DATA:  Altered mental status and difficulty ambulating EXAM: CT HEAD WITHOUT CONTRAST TECHNIQUE: Contiguous axial images were obtained from the base of the skull through the vertex without intravenous contrast. COMPARISON:  March 10, 2014 FINDINGS: Moderate diffuse atrophy is stable. There is no intracranial mass, hemorrhage, extra-axial fluid collection, or midline shift. There is evidence of a prior infarct in the left occipital lobe posteriorly with sparing of the calcarine cortex. There is patchy small vessel disease in the centra semiovale bilaterally. There is evidence of a prior small infarct in the left thalamus. No new gray-white compartment lesion is identified. No acute infarct evident. The bony calvarium appears intact. The mastoid air cells are clear. There is leftward deviation the nasal septum. No intraorbital lesions are evident. IMPRESSION: Stable atrophy with periventricular small vessel disease. Prior left occipital lobe infarct as well as prior small infarct in the left thalamus. No acute infarct evident. No hemorrhage or mass effect. There is leftward deviation of the nasal septum. Electronically Signed   By: Lowella Grip III M.D.   On: 06/05/2015 20:46   I have personally reviewed and evaluated these images and lab results as part of my medical decision-making.   EKG Interpretation   Date/Time:  Wednesday June 05 2015 19:55:16 EDT Ventricular Rate:  110 PR Interval:    QRS Duration: 141 QT Interval:  338 QTC Calculation: 457 R Axis:   -15 Text Interpretation:  Atrial flutter Paired ventricular premature  complexes LVH with secondary repolarization abnormality Premature  ventricular complexes Otherwise no significant change Confirmed by Garlan Drewes  MD, Quillian Quince [id number]) on 06/05/2015 8:34:09 PM      MDM   Final  diagnoses:  Fever of unknown origin  Acute on chronic systolic congestive heart failure ([place])    82 yo M With a chief complaint weakness. Patient  is unable to verbalize any other complaints. Febrile to 102 on arrival here. Blood pressure stable. Patient is given a small fluid bolus with his history of CHF. Will get a chest x-ray urine CBC CMP blood cultures.  Patient didn't have a focal source of fever. His BNP is markedly elevated at 1500. We'll give him Lasix. With weakness will admit.  The patients results and plan were reviewed and discussed.   Any x-rays performed were independently reviewed by myself.   Differential diagnosis were considered with the presenting HPI.  Medications  sodium chloride 0.9 % bolus 1,000 mL (0 mLs Intravenous Stopped 06/05/15 2248)  acetaminophen (TYLENOL) tablet 1,000 mg (1,000 mg Oral Given 06/05/15 2047)  furosemide (LASIX) injection 40 mg (40 mg Intravenous Given 06/05/15 2255)    Filed Vitals:   06/05/15 2000 06/05/15 2103 06/05/15 2153 06/05/15 2200  BP: 125/59 121/62 119/64 114/70  Pulse: 69 72 105 74  Temp:   98.9 F (37.2 C)   TempSrc:   Oral   Resp:   18   SpO2: 94% 98% 94% 96%    Final diagnoses:  Fever of unknown origin  Acute on chronic systolic congestive heart failure (HCC)    Admission/ observation were discussed with the admitting physician, patient and/or family and they are comfortable with the plan.    Deno Etienne, DO 06/05/15 2258

## 2015-06-05 NOTE — Progress Notes (Signed)
Patient presents to the Ed with weakness.  EDCM went to speak to patient at bedside.  Patient lives at home with his wife who has Alzheimer's, "I make all the decisions", patient stated.  Patient reports he has a daughter who assists with his wife and is planning on moving in with him and his wife in the near future.  Patient reports he has a walker and a cone and home but he doesn't need to use them.  Patient reports he is able to ambulate to the bathroom without any difficulty.  Patient reports he has had home health services in the past but he does not remember the name of the agency.  He reports his pcp is Dr. Kelton Pillar.  Patient reports he still drives.  EDCM described home health services to patient.  Patient reports he does not feel he needs any home health services and politely declined the list of home health agencies in [facility].  No further EDCM needs at this time.

## 2015-06-05 NOTE — ED Notes (Signed)
Pt in CT.

## 2015-06-05 NOTE — ED Notes (Signed)
Pt BIB EMS from home for weakness; pt called daughter an hour ago because he was unable to get out of his chair independently; daughter called 911 because pt is normally a little weak, but is usually able to at least get out of his chair.

## 2015-06-06 ENCOUNTER — Inpatient Hospital Stay (HOSPITAL_COMMUNITY): Payer: Medicare Other

## 2015-06-06 DIAGNOSIS — S91301A Unspecified open wound, right foot, initial encounter: Secondary | ICD-10-CM | POA: Diagnosis present

## 2015-06-06 DIAGNOSIS — Z79899 Other long term (current) drug therapy: Secondary | ICD-10-CM | POA: Diagnosis not present

## 2015-06-06 DIAGNOSIS — N183 Chronic kidney disease, stage 3 (moderate): Secondary | ICD-10-CM | POA: Diagnosis present

## 2015-06-06 DIAGNOSIS — L03115 Cellulitis of right lower limb: Secondary | ICD-10-CM

## 2015-06-06 DIAGNOSIS — E785 Hyperlipidemia, unspecified: Secondary | ICD-10-CM

## 2015-06-06 DIAGNOSIS — I509 Heart failure, unspecified: Secondary | ICD-10-CM | POA: Diagnosis not present

## 2015-06-06 DIAGNOSIS — Z8249 Family history of ischemic heart disease and other diseases of the circulatory system: Secondary | ICD-10-CM | POA: Diagnosis not present

## 2015-06-06 DIAGNOSIS — D649 Anemia, unspecified: Secondary | ICD-10-CM | POA: Diagnosis present

## 2015-06-06 DIAGNOSIS — Z87891 Personal history of nicotine dependence: Secondary | ICD-10-CM | POA: Diagnosis not present

## 2015-06-06 DIAGNOSIS — Z85828 Personal history of other malignant neoplasm of skin: Secondary | ICD-10-CM | POA: Diagnosis not present

## 2015-06-06 DIAGNOSIS — R531 Weakness: Secondary | ICD-10-CM | POA: Diagnosis present

## 2015-06-06 DIAGNOSIS — I481 Persistent atrial fibrillation: Secondary | ICD-10-CM | POA: Diagnosis present

## 2015-06-06 DIAGNOSIS — Z9842 Cataract extraction status, left eye: Secondary | ICD-10-CM | POA: Diagnosis not present

## 2015-06-06 DIAGNOSIS — A499 Bacterial infection, unspecified: Secondary | ICD-10-CM | POA: Diagnosis not present

## 2015-06-06 DIAGNOSIS — I472 Ventricular tachycardia: Secondary | ICD-10-CM | POA: Diagnosis present

## 2015-06-06 DIAGNOSIS — J189 Pneumonia, unspecified organism: Secondary | ICD-10-CM | POA: Diagnosis not present

## 2015-06-06 DIAGNOSIS — R262 Difficulty in walking, not elsewhere classified: Secondary | ICD-10-CM | POA: Diagnosis present

## 2015-06-06 DIAGNOSIS — I5023 Acute on chronic systolic (congestive) heart failure: Secondary | ICD-10-CM | POA: Insufficient documentation

## 2015-06-06 DIAGNOSIS — I4892 Unspecified atrial flutter: Secondary | ICD-10-CM | POA: Diagnosis present

## 2015-06-06 DIAGNOSIS — M7989 Other specified soft tissue disorders: Secondary | ICD-10-CM | POA: Diagnosis present

## 2015-06-06 DIAGNOSIS — Z9841 Cataract extraction status, right eye: Secondary | ICD-10-CM | POA: Diagnosis not present

## 2015-06-06 DIAGNOSIS — Z7901 Long term (current) use of anticoagulants: Secondary | ICD-10-CM | POA: Diagnosis not present

## 2015-06-06 DIAGNOSIS — A401 Sepsis due to streptococcus, group B: Secondary | ICD-10-CM | POA: Diagnosis not present

## 2015-06-06 DIAGNOSIS — I420 Dilated cardiomyopathy: Secondary | ICD-10-CM | POA: Diagnosis present

## 2015-06-06 DIAGNOSIS — X58XXXA Exposure to other specified factors, initial encounter: Secondary | ICD-10-CM | POA: Diagnosis present

## 2015-06-06 DIAGNOSIS — K219 Gastro-esophageal reflux disease without esophagitis: Secondary | ICD-10-CM | POA: Diagnosis present

## 2015-06-06 DIAGNOSIS — I482 Chronic atrial fibrillation: Secondary | ICD-10-CM

## 2015-06-06 DIAGNOSIS — I2510 Atherosclerotic heart disease of native coronary artery without angina pectoris: Secondary | ICD-10-CM | POA: Diagnosis present

## 2015-06-06 DIAGNOSIS — A419 Sepsis, unspecified organism: Secondary | ICD-10-CM

## 2015-06-06 DIAGNOSIS — Z9049 Acquired absence of other specified parts of digestive tract: Secondary | ICD-10-CM | POA: Diagnosis not present

## 2015-06-06 DIAGNOSIS — I248 Other forms of acute ischemic heart disease: Secondary | ICD-10-CM | POA: Diagnosis present

## 2015-06-06 DIAGNOSIS — Z8673 Personal history of transient ischemic attack (TIA), and cerebral infarction without residual deficits: Secondary | ICD-10-CM | POA: Diagnosis not present

## 2015-06-06 DIAGNOSIS — Z85038 Personal history of other malignant neoplasm of large intestine: Secondary | ICD-10-CM | POA: Diagnosis not present

## 2015-06-06 DIAGNOSIS — Z452 Encounter for adjustment and management of vascular access device: Secondary | ICD-10-CM | POA: Diagnosis not present

## 2015-06-06 DIAGNOSIS — I130 Hypertensive heart and chronic kidney disease with heart failure and stage 1 through stage 4 chronic kidney disease, or unspecified chronic kidney disease: Secondary | ICD-10-CM | POA: Diagnosis present

## 2015-06-06 DIAGNOSIS — Z952 Presence of prosthetic heart valve: Secondary | ICD-10-CM | POA: Diagnosis not present

## 2015-06-06 DIAGNOSIS — I255 Ischemic cardiomyopathy: Secondary | ICD-10-CM | POA: Diagnosis present

## 2015-06-06 DIAGNOSIS — Z951 Presence of aortocoronary bypass graft: Secondary | ICD-10-CM | POA: Diagnosis not present

## 2015-06-06 DIAGNOSIS — Z961 Presence of intraocular lens: Secondary | ICD-10-CM | POA: Diagnosis present

## 2015-06-06 DIAGNOSIS — R791 Abnormal coagulation profile: Secondary | ICD-10-CM | POA: Diagnosis present

## 2015-06-06 LAB — PROTIME-INR
INR: 3.3 — ABNORMAL HIGH (ref 0.00–1.49)
Prothrombin Time: 32.9 seconds — ABNORMAL HIGH (ref 11.6–15.2)

## 2015-06-06 LAB — LACTIC ACID, PLASMA
Lactic Acid, Venous: 1.5 mmol/L (ref 0.5–2.0)
Lactic Acid, Venous: 1.5 mmol/L (ref 0.5–2.0)

## 2015-06-06 LAB — INFLUENZA PANEL BY PCR (TYPE A & B)
H1N1 flu by pcr: NOT DETECTED
Influenza A By PCR: NEGATIVE
Influenza B By PCR: NEGATIVE

## 2015-06-06 LAB — PROCALCITONIN: Procalcitonin: 3.43 ng/mL

## 2015-06-06 MED ORDER — ADULT MULTIVITAMIN W/MINERALS CH
1.0000 | ORAL_TABLET | Freq: Every day | ORAL | Status: DC
  Administered 2015-06-06 – 2015-06-11 (×6): 1 via ORAL
  Filled 2015-06-06 (×6): qty 1

## 2015-06-06 MED ORDER — PIPERACILLIN-TAZOBACTAM 3.375 G IVPB
3.3750 g | Freq: Three times a day (TID) | INTRAVENOUS | Status: DC
  Administered 2015-06-06 – 2015-06-08 (×7): 3.375 g via INTRAVENOUS
  Filled 2015-06-06 (×7): qty 50

## 2015-06-06 MED ORDER — FUROSEMIDE 10 MG/ML IJ SOLN
40.0000 mg | Freq: Two times a day (BID) | INTRAMUSCULAR | Status: AC
  Administered 2015-06-06 – 2015-06-07 (×4): 40 mg via INTRAVENOUS
  Filled 2015-06-06 (×4): qty 4

## 2015-06-06 MED ORDER — PIPERACILLIN-TAZOBACTAM 3.375 G IVPB 30 MIN
3.3750 g | Freq: Once | INTRAVENOUS | Status: AC
  Administered 2015-06-06: 3.375 g via INTRAVENOUS
  Filled 2015-06-06 (×2): qty 50

## 2015-06-06 MED ORDER — VANCOMYCIN HCL IN DEXTROSE 1-5 GM/200ML-% IV SOLN
1000.0000 mg | Freq: Once | INTRAVENOUS | Status: AC
  Administered 2015-06-06: 1000 mg via INTRAVENOUS
  Filled 2015-06-06: qty 200

## 2015-06-06 MED ORDER — WARFARIN - PHARMACIST DOSING INPATIENT: Freq: Every day | Status: DC

## 2015-06-06 MED ORDER — ACETAMINOPHEN 325 MG PO TABS: 650.0000 mg | ORAL_TABLET | Freq: Four times a day (QID) | ORAL | Status: DC | PRN

## 2015-06-06 MED ORDER — ONDANSETRON HCL 4 MG/2ML IJ SOLN: 4.0000 mg | Freq: Four times a day (QID) | INTRAMUSCULAR | Status: DC | PRN

## 2015-06-06 MED ORDER — VANCOMYCIN HCL IN DEXTROSE 1-5 GM/200ML-% IV SOLN
1000.0000 mg | INTRAVENOUS | Status: DC
  Administered 2015-06-06 – 2015-06-07 (×2): 1000 mg via INTRAVENOUS
  Filled 2015-06-06 (×2): qty 200

## 2015-06-06 MED ORDER — POLYETHYLENE GLYCOL 3350 17 G PO PACK: 17.0000 g | PACK | Freq: Every day | ORAL | Status: DC | PRN

## 2015-06-06 MED ORDER — SIMVASTATIN 10 MG PO TABS
10.0000 mg | ORAL_TABLET | Freq: Every day | ORAL | Status: DC
  Administered 2015-06-06 – 2015-06-10 (×5): 10 mg via ORAL
  Filled 2015-06-06 (×5): qty 1

## 2015-06-06 MED ORDER — SODIUM CHLORIDE 0.9 % IV SOLN: 250.0000 mL | INTRAVENOUS | Status: DC | PRN

## 2015-06-06 MED ORDER — SODIUM CHLORIDE 0.9% FLUSH: 3.0000 mL | INTRAVENOUS | Status: DC | PRN

## 2015-06-06 MED ORDER — WARFARIN SODIUM 1 MG PO TABS
1.0000 mg | ORAL_TABLET | Freq: Once | ORAL | Status: AC
  Administered 2015-06-06: 1 mg via ORAL
  Filled 2015-06-06: qty 1

## 2015-06-06 MED ORDER — PANTOPRAZOLE SODIUM 40 MG PO TBEC
40.0000 mg | DELAYED_RELEASE_TABLET | Freq: Every day | ORAL | Status: DC
  Administered 2015-06-06 – 2015-06-11 (×6): 40 mg via ORAL
  Filled 2015-06-06 (×6): qty 1

## 2015-06-06 MED ORDER — FUROSEMIDE 10 MG/ML IJ SOLN: 40.0000 mg | Freq: Every day | INTRAMUSCULAR | Status: DC

## 2015-06-06 MED ORDER — SODIUM CHLORIDE 0.9% FLUSH
3.0000 mL | Freq: Two times a day (BID) | INTRAVENOUS | Status: DC
  Administered 2015-06-06 – 2015-06-10 (×7): 3 mL via INTRAVENOUS

## 2015-06-06 NOTE — Progress Notes (Signed)
CRITICAL VALUE ALERT  Critical value received:  Blood culture result is GRAM POSITIVE COCCI IN PAIRS IN CHAINS  Date of notification:  06/06/2015  Time of notification:  2107  Critical value read back:Yes.    Nurse who received alert:  L. Vergel de Larkin Ina RN  MD notified (1st page):  Tylene Fantasia NP  Time of first page:  2207  MD notified (2nd page):  Time of second page:  Responding MD:  Tylene Fantasia NP  Time MD responded:  [phone]

## 2015-06-06 NOTE — Progress Notes (Signed)
Pharmacy Antibiotic Note  Alfred Dennis is a 82 y.o. male admitted on 06/05/2015 with sepsis.  Pharmacy has been consulted for Vancomycin and Zosyn  dosing.  Plan: Vancomycin 1gm IV every 24 hours.  Goal trough 15-20 mcg/mL. Zosyn 3.375g IV q8h (4 hour infusion).  Height: 5\' 7"  (170.2 cm) Weight: 147 lb 7.8 oz (66.9 kg) IBW/kg (Calculated) : 66.1  Temp (24hrs), Avg:100.3 F (37.9 C), Min:98.3 F (36.8 C), Max:102.9 F (39.4 C)   Recent Labs Lab 06/05/15 1959 06/06/15 0044 06/06/15 0427  WBC 17.9*  --   --   CREATININE 1.56*  --   --   LATICACIDVEN  --  1.5 1.5    Estimated Creatinine Clearance: 30 mL/min (by C-G formula based on Cr of 1.56).    No Known Allergies  Antimicrobials this admission: Vancomycin 3/29 >> Zosyn 3/29 >>  Microbiology results: Pending  Thank you for allowing pharmacy to be a part of this patient's care.  Nani Skillern Crowford 06/06/2015 5:42 AM

## 2015-06-06 NOTE — Progress Notes (Signed)
  Echocardiogram 2D Echocardiogram has been performed.  Alfred Dennis 06/06/2015, 5:59 PM

## 2015-06-06 NOTE — Progress Notes (Signed)
ANTICOAGULATION CONSULT NOTE - Initial Consult  Pharmacy Consult for Warfarin Indication: atrial fibrillation  No Known Allergies  Patient Measurements: Height: 5\' 7"  (170.2 cm) Weight: 147 lb 7.8 oz (66.9 kg) IBW/kg (Calculated) : 66.1 Heparin Dosing Weight:   Vital Signs: Temp: 98.5 F (36.9 C) (03/30 0504) Temp Source: Oral (03/30 0504) BP: 117/53 mmHg (03/30 0504) Pulse Rate: 60 (03/30 0504)  Labs:  Recent Labs  06/05/15 1959 06/06/15 0427  HGB 11.6*  --   HCT 34.8*  --   PLT 171  --   LABPROT 32.9* 32.9*  INR 3.30* 3.30*  CREATININE 1.56*  --     Estimated Creatinine Clearance: 30 mL/min (by C-G formula based on Cr of 1.56).   Medical History: Past Medical History  Diagnosis Date  . Hypertension   . Shortness of breath     "w/any activity lately" (02/03/2012)  . CHF (congestive heart failure) ([place])     mild/note 02/03/2012  . Arthritis     "left foot; fingers" (02/03/2012)  . ARF (acute renal failure) ([place])   . GIB (gastrointestinal bleeding)   . Chronic atrial fibrillation ([place])   . Non-sustained ventricular tachycardia ([place])   . Chronic anticoagulation   . Coronary artery disease 05/2000    s/p CABG with LIMA to LAD, SVG to IM, SVG to OM  . Mitral valvular regurgitation     s/p MVR with mechanical valve  . Ischemic dilated cardiomyopathy     EF 25-30% by echo 01/2012 at time of colon surgery  . Hyperlipidemia   . Stroke [facility])     after CABG  . Carcinoma of colon ([place])   . Basal cell carcinoma of shoulder 10/2010    Medications:  Prescriptions prior to admission  Medication Sig Dispense Refill Last Dose  . acetaminophen (TYLENOL) 325 MG tablet Take 2 tablets (650 mg total) by mouth every 6 (six) hours as needed for mild pain (or Fever >/= 101).   Past Week at Unknown time  . furosemide (LASIX) 40 MG tablet Take 1 tablet (40 mg total) by mouth 2 (two) times daily. 30 tablet  06/05/2015 at 1200  . hydrALAZINE (APRESOLINE) 25 MG tablet Take 25 mg  by mouth 3 (three) times daily.    06/05/2015 at 1400  . metoprolol (LOPRESSOR) 50 MG tablet Take 100 mg by mouth 2 (two) times daily.   06/05/2015 at 1700  . Multiple Vitamin (MULTIVITAMIN WITH MINERALS) TABS Take 1 tablet by mouth daily.   06/05/2015 at Unknown time  . pantoprazole (PROTONIX) 40 MG tablet Take 40 mg by mouth daily.   06/05/2015 at Unknown time  . polyethylene glycol (MIRALAX / GLYCOLAX) packet Take 17 g by mouth daily as needed for mild constipation. 14 each 0 Past Week at Unknown time  . senna-docusate (SENOKOT-S) 8.6-50 MG tablet Take 2 tablets by mouth 2 (two) times daily.   06/05/2015 at Unknown time  . simvastatin (ZOCOR) 10 MG tablet Take 10 mg by mouth at bedtime.   06/04/2015 at Unknown time  . warfarin (COUMADIN) 5 MG tablet Take 2.5 mg by mouth daily. Daily   06/04/2015 at 1700  . potassium chloride (K-DUR) 10 MEQ tablet Take 1 tablet (10 mEq total) by mouth daily. 90 tablet 3 Taking  . traMADol (ULTRAM) 50 MG tablet Take 1 tablet (50 mg total) by mouth every 8 (eight) hours as needed. 20 tablet 0 Taking  . warfarin (COUMADIN) 5 MG tablet TAKE AS DIRECTED BY COUMADIN CLINIC 30 tablet 3  Taking   Scheduled:  . multivitamin with minerals  1 tablet Oral Daily  . pantoprazole  40 mg Oral Daily  . piperacillin-tazobactam (ZOSYN)  IV  3.375 g Intravenous 3 times per day  . simvastatin  10 mg Oral QHS  . sodium chloride flush  3 mL Intravenous Q12H  . Warfarin - Pharmacist Dosing Inpatient   Does not apply q1800    Assessment: Patient with chronic warfarin for Afib in ED with weakness.  INR on admit as well as this AM 3.3.    Goal of Therapy:  INR 2-3    Plan:  Daily INR No warfarin on 3/30  Tyler Deis, Shea Stakes Crowford 06/06/2015,5:39 AM

## 2015-06-06 NOTE — Progress Notes (Addendum)
PROGRESS NOTE  Alfred Dennis [id number] DOB: October 19, 1923 DOA: 06/05/2015 PCP: Osborne Casco, MD Brief History 82 year old male with a history of ischemic cardiomyopathy EF 25-35 percent, hypertension, mitral valve replacement,  Persistent atrial fibrillation, colon cancer status post right hemicolectomy  Presents with 2 day history of fevers, chills, and shortness of breath. The patient is a difficult historian, but states that he has had increasing lower extremity edema over the past week.  He denies any nausea, vomiting, diarrhea, abdominal pain, dysuria, hematuria. He complains of some chest heaviness and nonproductive cough.  Upon presentation, he was noted to have WBC 17.9,, lactic acid 1.5, procalcitonin 3.43. The patient was started on intravenous vancomycin and Zosyn after blood cultures and urine cultures were obtained. Assessment/Plan: Sepsis -Present at the time of admission with WBC 17.9 and fever to 102.9 -sources likely due tocellulitis and possible pneumonia -continue intravenous antibiotics pending culture data -lactic acid 1.5 -Procalcitonin 3.43 -Influenza negative -Chest x-ray with interstitial prominence and patchy bilateral infiltrates -repeat CXR am 3/31  Acute on chronic systolic CHF/ischemic cardiomyopathy -Daily weights -Change furosemide to 40 mg IV twice a day -Strict I's and O's  cellulitis right lower extremity -Continue intravenous antibiotics  Persistent atrial fibrillation -Continue warfarin -I have asked the patient to bring in an updated list of his home medications  Status post mitral valve replacement -Continue warfarin -INR 3.3  History of nonsustained ventricular tachycardia -He has been deemed not a good candidate for ICD secondary to his comorbidities -optimize electrolytes  CKD stage III -Baseline creatinine 1.2-1.5 -Monitor with diuresis  Colon cancer status post right colectomy -he follows with Dr.  Benay Spice     Family Communication:   Daughter updated at beside Disposition Plan:   Home in 2-3 days Total time 40 min, >50% spent counseling and coordinating care    Procedures/Studies: Dg Chest 2 View  06/05/2015  CLINICAL DATA:  82 year old male with weakness EXAM: CHEST  2 VIEW COMPARISON:  Radiograph dated 03/21/2014 FINDINGS: There is stable moderate cardiomegaly. Bilateral central vascular and interstitial prominence most compatible with congestive changes. Pneumonia is less likely but not excluded. There is no focal consolidation, pleural effusion, or pneumothorax. There is elevation of the left hemidiaphragm. Median sternotomy wires, mitral valve replacement, and CABG vascular clips noted. There is osteopenia with degenerative changes of the spine. No acute fracture. IMPRESSION: Stable moderate cardiomegaly with mild congestive changes. Electronically Signed   By: Anner Crete M.D.   On: 06/05/2015 21:01   Ct Head Wo Contrast  06/05/2015  CLINICAL DATA:  Altered mental status and difficulty ambulating EXAM: CT HEAD WITHOUT CONTRAST TECHNIQUE: Contiguous axial images were obtained from the base of the skull through the vertex without intravenous contrast. COMPARISON:  March 10, 2014 FINDINGS: Moderate diffuse atrophy is stable. There is no intracranial mass, hemorrhage, extra-axial fluid collection, or midline shift. There is evidence of a prior infarct in the left occipital lobe posteriorly with sparing of the calcarine cortex. There is patchy small vessel disease in the centra semiovale bilaterally. There is evidence of a prior small infarct in the left thalamus. No new gray-white compartment lesion is identified. No acute infarct evident. The bony calvarium appears intact. The mastoid air cells are clear. There is leftward deviation the nasal septum. No intraorbital lesions are evident. IMPRESSION: Stable atrophy with periventricular small vessel disease. Prior left occipital lobe  infarct as well as prior small infarct in the left thalamus. No acute infarct  evident. No hemorrhage or mass effect. There is leftward deviation of the nasal septum. Electronically Signed   By: Lowella Grip III M.D.   On: 06/05/2015 20:46         Subjective: Patient denies fevers, chills, headache, chest pain, dyspnea, nausea, vomiting, diarrhea, abdominal pain, dysuria, hematuria   Objective: Filed Vitals:   06/05/15 2313 06/05/15 2354 06/06/15 0500 06/06/15 0504  BP: 97/46 104/54  117/53  Pulse: 107   60  Temp:  98.3 F (36.8 C)  98.5 F (36.9 C)  TempSrc:  Oral  Oral  Resp:  188  18  Height:  5\' 7"  (1.702 m)    Weight:  66.9 kg (147 lb 7.8 oz) 63.9 kg (140 lb 14 oz)   SpO2: 96% 97%  98%    Intake/Output Summary (Last 24 hours) at 06/06/15 0817 Last data filed at 06/06/15 0515  Gross per 24 hour  Intake    120 ml  Output    625 ml  Net   -505 ml   Weight change:  Exam:   General:  Pt is alert, follows commands appropriately, not in acute distress  HEENT: No icterus, No thrush, No neck mass, [place]/AT  Cardiovascular: RRR, S1/S2, no rubs, no gallops  Respiratory: bilateral crackles, left greater than right. No wheezing.  Abdomen: Soft/+BS, non tender, non distended, no guarding  Extremities: 2+LE edema, No lymphangitis, No petechiae, No rashes, no synovitis; erythema about the pretibial area of the right lower extremity without draining wounds  Data Reviewed: Basic Metabolic Panel:  Recent Labs Lab 06/05/15 1959  NA 140  K 4.0  CL 108  CO2 22  GLUCOSE 104*  BUN 32*  CREATININE 1.56*  CALCIUM 9.1   Liver Function Tests:  Recent Labs Lab 06/05/15 1959  AST 73*  ALT 45  ALKPHOS 81  BILITOT 1.2  PROT 6.4*  ALBUMIN 3.8   No results for input(s): LIPASE, AMYLASE in the last 168 hours. No results for input(s): AMMONIA in the last 168 hours. CBC:  Recent Labs Lab 06/05/15 1959  WBC 17.9*  NEUTROABS 16.7*  HGB 11.6*  HCT 34.8*  MCV 89.9   PLT 171   Cardiac Enzymes: No results for input(s): CKTOTAL, CKMB, CKMBINDEX, TROPONINI in the last 168 hours. BNP: Invalid input(s): POCBNP CBG: No results for input(s): GLUCAP in the last 168 hours.  No results found for this or any previous visit (from the past 240 hour(s)).   Scheduled Meds: . furosemide  40 mg Intravenous Daily  . multivitamin with minerals  1 tablet Oral Daily  . pantoprazole  40 mg Oral Daily  . piperacillin-tazobactam (ZOSYN)  IV  3.375 g Intravenous 3 times per day  . simvastatin  10 mg Oral QHS  . sodium chloride flush  3 mL Intravenous Q12H  . vancomycin  1,000 mg Intravenous Q24H  . Warfarin - Pharmacist Dosing Inpatient   Does not apply q1800   Continuous Infusions:    Madison Direnzo, DO  [facility] Pager [phone]  If 7PM-7AM, please contact night-coverage [email or web address] Password [email or web address] 06/06/2015, 8:17 AM   LOS: 0 days

## 2015-06-06 NOTE — Progress Notes (Signed)
[place] for Warfarin Indication: atrial fibrillation, hx St Jude's MVR  No Known Allergies  Patient Measurements: Height: 5\' 7"  (170.2 cm) Weight: 140 lb 14 oz (63.9 kg) IBW/kg (Calculated) : 66.1  Vital Signs: Temp: 98.5 F (36.9 C) (03/30 0504) Temp Source: Oral (03/30 0504) BP: 117/53 mmHg (03/30 0504) Pulse Rate: 60 (03/30 0504)  Labs:  Recent Labs  06/05/15 1959 06/06/15 0427  HGB 11.6*  --   HCT 34.8*  --   PLT 171  --   LABPROT 32.9* 32.9*  INR 3.30* 3.30*  CREATININE 1.56*  --     Estimated Creatinine Clearance: 29 mL/min (by C-G formula based on Cr of 1.56).   Medical History: Past Medical History  Diagnosis Date  . Hypertension   . Shortness of breath     "w/any activity lately" (02/03/2012)  . CHF (congestive heart failure) ([place])     mild/note 02/03/2012  . Arthritis     "left foot; fingers" (02/03/2012)  . ARF (acute renal failure) ([place])   . GIB (gastrointestinal bleeding)   . Chronic atrial fibrillation ([place])   . Non-sustained ventricular tachycardia ([place])   . Chronic anticoagulation   . Coronary artery disease 05/2000    s/p CABG with LIMA to LAD, SVG to IM, SVG to OM  . Mitral valvular regurgitation     s/p MVR with mechanical valve  . Ischemic dilated cardiomyopathy     EF 25-30% by echo 01/2012 at time of colon surgery  . Hyperlipidemia   . Stroke [facility])     after CABG  . Carcinoma of colon ([place])   . Basal cell carcinoma of shoulder 10/2010   PTA Warfarin dose = 2.5mg  po daily.  LD 3/28 PM.  Assessment: Patient with Afib, hx St Jude's MVR on chronic warfarin 2.5mg .  INR therapeutic on admit. He is managed by [facility]. He is admitted with sepsis and started on IV antibiotics.    06/06/2015:  INR unchanged from admission (3.3)  Hg slightly low, but stable from previous encounters.  Pltc wnl.  No bleeding reported.   Cardiac diet ordered  No drug-drug interactions  noted  Goal of Therapy:  INR 2.5-3.5   Plan:   Coumadin 1mg  po x1 today (lowered dose due to acute illness, INR on high end of goal range)  Daily INR  Netta Cedars, PharmD, BCPS Pager: [phone] 06/06/2015,8:47 AM

## 2015-06-06 NOTE — Evaluation (Signed)
Physical Therapy Evaluation Patient Details Name: Alfred Dennis MRN: [id number] DOB: Jul 02, 1923 Today's Date: 06/06/2015   History of Present Illness  81 year old male with a history of ischemic cardiomyopathy EF 25-35 percent, hypertension, mitral valve replacement, Persistent atrial fibrillation, colon cancer status post right hemicolectomyand admitted for sepsis, R LE cellulitis  Clinical Impression  Pt admitted with above diagnosis. Pt currently with functional limitations due to the deficits listed below (see PT Problem List).  Pt will benefit from skilled PT to increase their independence and safety with mobility to allow discharge to the venue listed below.   Pt presents with generalized weakness and decreased endurance.  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  Pt may need ST-SNF if 24/7 assist not available at home.     Follow Up Recommendations Supervision/Assistance - 24 hour;Home health PT    Equipment Recommendations  None recommended by PT    Recommendations for Other Services       Precautions / Restrictions Precautions Precautions: Fall      Mobility  Bed Mobility Overal bed mobility: Needs Assistance Bed Mobility: Supine to Sit     Supine to sit: HOB elevated;Supervision     General bed mobility comments: increased time  Transfers Overall transfer level: Needs assistance Equipment used: Rolling walker (2 wheeled) Transfers: Sit to/from Stand Sit to Stand: Min assist         General transfer comment: verbal cues for hand placement, assist for rise  Ambulation/Gait Ambulation/Gait assistance: Min guard Ambulation Distance (Feet): 80 Feet Assistive device: Rolling walker (2 wheeled) Gait Pattern/deviations: Step-through pattern;Trunk flexed;Decreased stride length     General Gait Details: verbal cues for safe use of RW  Stairs            Wheelchair Mobility    Modified Rankin  (Stroke Patients Only)       Balance                                             Pertinent Vitals/Pain Pain Assessment: No/denies pain    Home Living Family/patient expects to be discharged to:: Private residence Living Arrangements: Spouse/significant other Available Help at Discharge: Family Type of Home: [place]: One level Home Equipment: Environmental consultant - 2 wheels;Bedside commode;Cane - quad      Prior Function Level of Independence: Independent   Gait / Transfers Assistance Needed: reports not using assistive device     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     Hand Dominance        Extremity/Trunk Assessment               Lower Extremity Assessment: Generalized weakness (edematous LEs)         Communication      Cognition Arousal/Alertness: Awake/alert Behavior During Therapy: WFL for tasks assessed/performed Overall Cognitive Status: Within Functional Limits for tasks assessed                      General Comments      Exercises        Assessment/Plan    PT Assessment Patient needs continued PT services  PT Diagnosis Difficulty walking;Generalized weakness   PT Problem List Decreased strength;Decreased activity tolerance;Decreased mobility;Decreased knowledge of use of DME  PT Treatment Interventions DME instruction;Gait training;Functional  mobility training;Patient/family education;Therapeutic activities;Therapeutic exercise   PT Goals (Current goals can be found in the Care Plan section) Acute Rehab PT Goals PT Goal Formulation: With patient Time For Goal Achievement: 06/13/15 Potential to Achieve Goals: Good    Frequency Min 3X/week   Barriers to discharge        Co-evaluation               End of Session Equipment Utilized During Treatment: Gait belt Activity Tolerance: Patient tolerated treatment well Patient left: in chair;with call bell/phone within reach;with chair  alarm set Nurse Communication: Mobility status         Time: 1443-1500 PT Time Calculation (min) (ACUTE ONLY): 17 min   Charges:   PT Evaluation $PT Eval Moderate Complexity: 1 Procedure     PT G Codes:        Chantella Creech,KATHrine E 06/06/2015, 3:50 PM Carmelia Bake, PT, DPT 06/06/2015 Pager: [phone]

## 2015-06-07 DIAGNOSIS — R7881 Bacteremia: Secondary | ICD-10-CM

## 2015-06-07 DIAGNOSIS — A408 Other streptococcal sepsis: Secondary | ICD-10-CM

## 2015-06-07 DIAGNOSIS — A499 Bacterial infection, unspecified: Secondary | ICD-10-CM

## 2015-06-07 LAB — CBC
HCT: 33.9 % — ABNORMAL LOW (ref 39.0–52.0)
HEMOGLOBIN: 10.9 g/dL — AB (ref 13.0–17.0)
MCH: 28.8 pg (ref 26.0–34.0)
MCHC: 32.2 g/dL (ref 30.0–36.0)
MCV: 89.4 fL (ref 78.0–100.0)
Platelets: 127 10*3/uL — ABNORMAL LOW (ref 150–400)
RBC: 3.79 MIL/uL — AB (ref 4.22–5.81)
RDW: 15.1 % (ref 11.5–15.5)
WBC: 15.4 10*3/uL — ABNORMAL HIGH (ref 4.0–10.5)

## 2015-06-07 LAB — BASIC METABOLIC PANEL
Anion gap: 11 (ref 5–15)
BUN: 33 mg/dL — AB (ref 6–20)
CHLORIDE: 102 mmol/L (ref 101–111)
CO2: 25 mmol/L (ref 22–32)
Calcium: 8.7 mg/dL — ABNORMAL LOW (ref 8.9–10.3)
Creatinine, Ser: 1.67 mg/dL — ABNORMAL HIGH (ref 0.61–1.24)
GFR calc Af Amer: 40 mL/min — ABNORMAL LOW (ref >60)
GFR calc non Af Amer: 35 mL/min — ABNORMAL LOW (ref >60)
Glucose, Bld: 103 mg/dL — ABNORMAL HIGH (ref 65–99)
POTASSIUM: 3.5 mmol/L (ref 3.5–5.1)
SODIUM: 138 mmol/L (ref 135–145)

## 2015-06-07 LAB — MAGNESIUM: MAGNESIUM: 2 mg/dL (ref 1.7–2.4)

## 2015-06-07 LAB — ECHOCARDIOGRAM COMPLETE
HEIGHTINCHES: 67 in
WEIGHTICAEL: 2253.98 [oz_av]

## 2015-06-07 LAB — PROTIME-INR
INR: 2.37 — ABNORMAL HIGH (ref 0.00–1.49)
PROTHROMBIN TIME: 25.7 s — AB (ref 11.6–15.2)

## 2015-06-07 MED ORDER — FUROSEMIDE 40 MG PO TABS
40.0000 mg | ORAL_TABLET | Freq: Every day | ORAL | Status: DC
  Administered 2015-06-08: 40 mg via ORAL
  Filled 2015-06-07: qty 1

## 2015-06-07 MED ORDER — MAGNESIUM SULFATE 2 GM/50ML IV SOLN
2.0000 g | Freq: Once | INTRAVENOUS | Status: AC
  Administered 2015-06-07: 2 g via INTRAVENOUS
  Filled 2015-06-07: qty 50

## 2015-06-07 MED ORDER — POTASSIUM CHLORIDE CRYS ER 20 MEQ PO TBCR
20.0000 meq | EXTENDED_RELEASE_TABLET | Freq: Once | ORAL | Status: AC
  Administered 2015-06-07: 20 meq via ORAL
  Filled 2015-06-07: qty 1

## 2015-06-07 MED ORDER — WARFARIN SODIUM 2.5 MG PO TABS
2.5000 mg | ORAL_TABLET | Freq: Once | ORAL | Status: AC
  Administered 2015-06-07: 2.5 mg via ORAL
  Filled 2015-06-07: qty 1

## 2015-06-07 NOTE — Progress Notes (Signed)
Patient having more episodes of VTACH. Dr. Carles Collet aware.

## 2015-06-07 NOTE — Progress Notes (Signed)
Patient had 8 beats of v.tach, asymptomatic.

## 2015-06-07 NOTE — Progress Notes (Signed)
PROGRESS NOTE  Alfred Dennis [id number] DOB: 17-Nov-1923 DOA: 06/05/2015 PCP: Osborne Casco, MD  Brief History 82 year old male with a history of ischemic cardiomyopathy EF 25-35 percent, hypertension, mitral valve replacement, Persistent atrial fibrillation, colon cancer status post right hemicolectomy Presents with 2 day history of fevers, chills, and shortness of breath. The patient is a difficult historian, but states that he has had increasing lower extremity edema over the past week. He denies any nausea, vomiting, diarrhea, abdominal pain, dysuria, hematuria. He complains of some chest heaviness and nonproductive cough. Upon presentation, he was noted to have WBC 17.9,, lactic acid 1.5, procalcitonin 3.43. The patient was started on intravenous vancomycin and Zosyn after blood cultures and urine cultures were obtained. Assessment/Plan: Sepsis -Present at the time of admission with WBC 17.9 and fever to 102.9 -sources likely due tocellulitis and possible pneumonia -continue intravenous antibiotics pending culture data -lactic acid 1.5 -Procalcitonin 3.43 -Influenza negative -Chest x-ray with interstitial prominence and patchy bilateral infiltrates -repeat CXR am 04/01  Bacteremia -06/06/15--blood culture--GPC in chains -d/c zosyn; continue vanco pending final culture data  Acute on chronic systolic CHF/ischemic cardiomyopathy -Daily weights -Change furosemide to 40 mg IV twice a day--through 06/07/15 -plan to switch to po lasix 06/08/15 am -Strict I's and O's -NEG 2.5L -NEG 2 pounds -06/07/2015 echo EF 20-25%, diffuse HK, PAP 55  cellulitis right lower extremity -Continue intravenous antibiotics -improving  Persistent atrial fibrillation -Continue warfarin -I have asked the patient to bring in an updated list of his home medications  Status post mitral valve replacement -Continue warfarin -INR 3.3 at time of admission  History of nonsustained  ventricular tachycardia -He has been deemed not a good candidate for ICD secondary to his comorbidities -optimize electrolytes  CKD stage III -Baseline creatinine 1.2-1.5 -Monitor with diuresis  Colon cancer status post right colectomy -he follows with Dr. Benay Spice   Family Communication: Daughter updated at beside 06/07/15 Disposition Plan: Home in 1-2 days Total time 35 min; >50% spent couseling and coordinating care      Procedures/Studies: Dg Chest 2 View  06/05/2015  CLINICAL DATA:  82 year old male with weakness EXAM: CHEST  2 VIEW COMPARISON:  Radiograph dated 03/21/2014 FINDINGS: There is stable moderate cardiomegaly. Bilateral central vascular and interstitial prominence most compatible with congestive changes. Pneumonia is less likely but not excluded. There is no focal consolidation, pleural effusion, or pneumothorax. There is elevation of the left hemidiaphragm. Median sternotomy wires, mitral valve replacement, and CABG vascular clips noted. There is osteopenia with degenerative changes of the spine. No acute fracture. IMPRESSION: Stable moderate cardiomegaly with mild congestive changes. Electronically Signed   By: Anner Crete M.D.   On: 06/05/2015 21:01   Ct Head Wo Contrast  06/05/2015  CLINICAL DATA:  Altered mental status and difficulty ambulating EXAM: CT HEAD WITHOUT CONTRAST TECHNIQUE: Contiguous axial images were obtained from the base of the skull through the vertex without intravenous contrast. COMPARISON:  March 10, 2014 FINDINGS: Moderate diffuse atrophy is stable. There is no intracranial mass, hemorrhage, extra-axial fluid collection, or midline shift. There is evidence of a prior infarct in the left occipital lobe posteriorly with sparing of the calcarine cortex. There is patchy small vessel disease in the centra semiovale bilaterally. There is evidence of a prior small infarct in the left thalamus. No new gray-white compartment lesion is identified. No  acute infarct evident. The bony calvarium appears intact. The mastoid air cells are clear. There is leftward  deviation the nasal septum. No intraorbital lesions are evident. IMPRESSION: Stable atrophy with periventricular small vessel disease. Prior left occipital lobe infarct as well as prior small infarct in the left thalamus. No acute infarct evident. No hemorrhage or mass effect. There is leftward deviation of the nasal septum. Electronically Signed   By: Lowella Grip III M.D.   On: 06/05/2015 20:46         Subjective: Patient denies fevers, chills, headache, chest pain, dyspnea, nausea, vomiting, diarrhea, abdominal pain, dysuria, hematuria   Objective: Filed Vitals:   06/06/15 2001 06/07/15 0500 06/07/15 0624 06/07/15 0700  BP: 104/51  117/63 140/65  Pulse: 63  54 75  Temp: 98.2 F (36.8 C)  97.8 F (36.6 C)   TempSrc: Oral  Oral   Resp: 16  16 18   Height:      Weight:  62.7 kg (138 lb 3.7 oz)    SpO2: 100%  100% 98%    Intake/Output Summary (Last 24 hours) at 06/07/15 1300 Last data filed at 06/07/15 0543  Gross per 24 hour  Intake    640 ml  Output   2700 ml  Net  -2060 ml   Weight change: -4.2 kg (-9 lb 4.2 oz) Exam:   General:  Pt is alert, follows commands appropriately, not in acute distress  HEENT: No icterus, No thrush, No neck mass, [place]/AT  Cardiovascular: RRR, S1/S2, no rubs, no gallops  Respiratory: bibasilar crackles, left greater than right. No wheezing  Abdomen: Soft/+BS, non tender, non distended, no guarding  Extremities: 1+ LE edema, No lymphangitis, No petechiae, No rashes, no synovitis; erythema about the right pretibial area without draining wounds or lymphangitis  Data Reviewed: Basic Metabolic Panel:  Recent Labs Lab 06/05/15 1959 06/07/15 0430  NA 140 138  K 4.0 3.5  CL 108 102  CO2 22 25  GLUCOSE 104* 103*  BUN 32* 33*  CREATININE 1.56* 1.67*  CALCIUM 9.1 8.7*  MG  --  2.0   Liver Function Tests:  Recent Labs Lab  06/05/15 1959  AST 73*  ALT 45  ALKPHOS 81  BILITOT 1.2  PROT 6.4*  ALBUMIN 3.8   No results for input(s): LIPASE, AMYLASE in the last 168 hours. No results for input(s): AMMONIA in the last 168 hours. CBC:  Recent Labs Lab 06/05/15 1959 06/07/15 0430  WBC 17.9* 15.4*  NEUTROABS 16.7*  --   HGB 11.6* 10.9*  HCT 34.8* 33.9*  MCV 89.9 89.4  PLT 171 127*   Cardiac Enzymes: No results for input(s): CKTOTAL, CKMB, CKMBINDEX, TROPONINI in the last 168 hours. BNP: Invalid input(s): POCBNP CBG: No results for input(s): GLUCAP in the last 168 hours.  Recent Results (from the past 240 hour(s))  Culture, blood (x 2)     Status: None (Preliminary result)   Collection Time: 06/06/15 12:44 AM  Result Value Ref Range Status   Specimen Description BLOOD RIGHT ARM  Final   Special Requests BOTTLES DRAWN AEROBIC AND ANAEROBIC 5CC  Final   Culture  Setup Time   Final    GRAM POSITIVE COCCI IN PAIRS IN CHAINS AEROBIC BOTTLE ONLY CRITICAL RESULT CALLED TO, READ BACK BY AND VERIFIED WITH: L BERGELDEDIOS RN [id number] 06/06/15 A BROWNING    Culture   Final    GRAM POSITIVE COCCI CULTURE REINCUBATED FOR BETTER GROWTH Performed at [facility]    Report Status PENDING  Incomplete  Culture, blood (x 2)     Status: None (Preliminary result)  Collection Time: 06/06/15 12:44 AM  Result Value Ref Range Status   Specimen Description BLOOD RIGHT HAND  Final   Special Requests IN PEDIATRIC BOTTLE 2CC  Final   Culture   Final    NO GROWTH 1 DAY Performed at [facility]    Report Status PENDING  Incomplete     Scheduled Meds: . furosemide  40 mg Intravenous BID  . [START ON 06/08/2015] furosemide  40 mg Oral Daily  . multivitamin with minerals  1 tablet Oral Daily  . pantoprazole  40 mg Oral Daily  . piperacillin-tazobactam (ZOSYN)  IV  3.375 g Intravenous 3 times per day  . potassium chloride  20 mEq Oral Once  . simvastatin  10 mg Oral QHS  . sodium chloride flush  3 mL  Intravenous Q12H  . vancomycin  1,000 mg Intravenous Q24H  . Warfarin - Pharmacist Dosing Inpatient   Does not apply q1800   Continuous Infusions:    Epifania Littrell, DO  [facility] Pager [phone]  If 7PM-7AM, please contact night-coverage [email or web address] Password [email or web address] 06/07/2015, 1:00 PM   LOS: 1 day

## 2015-06-07 NOTE — Progress Notes (Signed)
Physical Therapy Treatment Patient Details Name: Alfred Dennis MRN: [id number] DOB: Jun 06, 1923 Today's Date: 06/07/2015    History of Present Illness 82 year old male with a history of ischemic cardiomyopathy EF 25-35 percent, hypertension, mitral valve replacement, Persistent atrial fibrillation, colon cancer status post right hemicolectomyand admitted for sepsis, R LE cellulitis    PT Comments    The patient is stronger today, recommend he have assistance to ambulate at this time. HR 129 during ambulation. Daughter present.  Follow Up Recommendations  Supervision/Assistance - 24 hour;Home health PT     Equipment Recommendations  None recommended by PT    Recommendations for Other Services       Precautions / Restrictions Precautions Precautions: Fall    Mobility  Bed Mobility         Supine to sit: HOB elevated;Supervision     General bed mobility comments: increased time  Transfers   Equipment used: Rolling walker (2 wheeled) Transfers: Sit to/from Stand Sit to Stand: Min assist         General transfer comment: verbal cues for hand placement, assist for rise  Ambulation/Gait Ambulation/Gait assistance: Min guard Ambulation Distance (Feet): 160 Feet Assistive device: Rolling walker (2 wheeled) Gait Pattern/deviations: Step-to pattern;Step-through pattern;Decreased stride length   Gait velocity interpretation: Below normal speed for age/gender General Gait Details: verbal cues for safe use of RW, HR  115-129   Stairs            Wheelchair Mobility    Modified Rankin (Stroke Patients Only)       Balance                                    Cognition Arousal/Alertness: Awake/alert                          Exercises      General Comments        Pertinent Vitals/Pain Pain Assessment: No/denies pain    Home Living                      Prior Function            PT Goals (current goals can  now be found in the care plan section) Progress towards PT goals: Progressing toward goals    Frequency  Min 3X/week    PT Plan Current plan remains appropriate    Co-evaluation             End of Session Equipment Utilized During Treatment: Gait belt Activity Tolerance: Patient tolerated treatment well Patient left: in bed;with call bell/phone within reach;with family/visitor present     Time: 1410-1435 PT Time Calculation (min) (ACUTE ONLY): 25 min  Charges:  $Gait Training: 23-37 mins                    G Codes:      Claretha Cooper 06/07/2015, 3:54 PM

## 2015-06-07 NOTE — Progress Notes (Signed)
[place] for Warfarin Indication: atrial fibrillation, hx St Jude's MVR  No Known Allergies  Patient Measurements: Height: 5\' 7"  (170.2 cm) Weight: 138 lb 3.7 oz (62.7 kg) IBW/kg (Calculated) : 66.1  Vital Signs: Temp: 97.8 F (36.6 C) (03/31 0624) Temp Source: Oral (03/31 0624) BP: 140/65 mmHg (03/31 0700) Pulse Rate: 75 (03/31 0700)  Labs:  Recent Labs  06/05/15 1959 06/06/15 0427 06/07/15 0430  HGB 11.6*  --  10.9*  HCT 34.8*  --  33.9*  PLT 171  --  127*  LABPROT 32.9* 32.9* 25.7*  INR 3.30* 3.30* 2.37*  CREATININE 1.56*  --  1.67*    Estimated Creatinine Clearance: 26.6 mL/min (by C-G formula based on Cr of 1.67).   PTA Warfarin dose = 2.5mg  po daily.   Assessment: Patient with Afib, hx St Jude's MVR on chronic warfarin 2.5mg .  INR therapeutic on admit. He is managed by [facility]. He is admitted with sepsis and started on IV antibiotics.    06/07/2015:  INR has decreased from admission and slightly subtherapeutic (2.37)  CBC decreased, no bleeding reported.   Cardiac diet ordered  No drug-drug interactions noted  Goal of Therapy:  INR 2.5-3.5   Plan:   Warfarin 2.5mg  PO x 1 today - give early at [place]  Daily INR  Peggyann Juba, PharmD, BCPS Pager: [phone]  06/07/2015,8:29 AM

## 2015-06-07 NOTE — Progress Notes (Signed)
Pt had 5 beats vtach. Vs are 140/65, 96% room air, 75bpm. Pt is asymptomatic.

## 2015-06-08 ENCOUNTER — Inpatient Hospital Stay (HOSPITAL_COMMUNITY): Payer: Medicare Other

## 2015-06-08 DIAGNOSIS — A401 Sepsis due to streptococcus, group B: Principal | ICD-10-CM

## 2015-06-08 LAB — CBC
HEMATOCRIT: 35.4 % — AB (ref 39.0–52.0)
HEMOGLOBIN: 11.9 g/dL — AB (ref 13.0–17.0)
MCH: 29.8 pg (ref 26.0–34.0)
MCHC: 33.6 g/dL (ref 30.0–36.0)
MCV: 88.7 fL (ref 78.0–100.0)
Platelets: 139 10*3/uL — ABNORMAL LOW (ref 150–400)
RBC: 3.99 MIL/uL — ABNORMAL LOW (ref 4.22–5.81)
RDW: 14.9 % (ref 11.5–15.5)
WBC: 10.3 10*3/uL (ref 4.0–10.5)

## 2015-06-08 LAB — BASIC METABOLIC PANEL
Anion gap: 12 (ref 5–15)
BUN: 36 mg/dL — ABNORMAL HIGH (ref 6–20)
CALCIUM: 8.8 mg/dL — AB (ref 8.9–10.3)
CHLORIDE: 102 mmol/L (ref 101–111)
CO2: 26 mmol/L (ref 22–32)
CREATININE: 1.53 mg/dL — AB (ref 0.61–1.24)
GFR, EST AFRICAN AMERICAN: 45 mL/min — AB (ref >60)
GFR, EST NON AFRICAN AMERICAN: 39 mL/min — AB (ref >60)
Glucose, Bld: 101 mg/dL — ABNORMAL HIGH (ref 65–99)
Potassium: 3.8 mmol/L (ref 3.5–5.1)
SODIUM: 140 mmol/L (ref 135–145)

## 2015-06-08 LAB — TROPONIN I
Troponin I: 0.09 ng/mL — ABNORMAL HIGH (ref <0.031)
Troponin I: 0.08 ng/mL — ABNORMAL HIGH (ref <0.031)
Troponin I: 0.1 ng/mL — ABNORMAL HIGH (ref <0.031)

## 2015-06-08 LAB — PROTIME-INR
INR: 2.26 — AB (ref 0.00–1.49)
PROTHROMBIN TIME: 24.8 s — AB (ref 11.6–15.2)

## 2015-06-08 MED ORDER — WARFARIN SODIUM 5 MG PO TABS
5.0000 mg | ORAL_TABLET | Freq: Once | ORAL | Status: AC
  Administered 2015-06-08: 5 mg via ORAL
  Filled 2015-06-08: qty 1

## 2015-06-08 MED ORDER — GI COCKTAIL ~~LOC~~
30.0000 mL | Freq: Once | ORAL | Status: AC
  Administered 2015-06-08: 30 mL via ORAL
  Filled 2015-06-08: qty 30

## 2015-06-08 MED ORDER — FUROSEMIDE 40 MG PO TABS
40.0000 mg | ORAL_TABLET | Freq: Two times a day (BID) | ORAL | Status: DC
  Administered 2015-06-09 – 2015-06-11 (×5): 40 mg via ORAL
  Filled 2015-06-08 (×5): qty 1

## 2015-06-08 MED ORDER — CEFTRIAXONE SODIUM 2 G IJ SOLR
2.0000 g | INTRAMUSCULAR | Status: DC
  Administered 2015-06-08 – 2015-06-11 (×4): 2 g via INTRAVENOUS
  Filled 2015-06-08 (×4): qty 2

## 2015-06-08 NOTE — Progress Notes (Addendum)
PROGRESS NOTE  Alfred Dennis [id number] DOB: 27-Nov-1923 DOA: 06/05/2015 PCP: Osborne Casco, MD Brief History 82 year old male with a history of ischemic cardiomyopathy EF 25-35 percent, hypertension, mitral valve replacement, Persistent atrial fibrillation, colon cancer status post right hemicolectomy Presents with 2 day history of fevers, chills, and shortness of breath. The patient is a difficult historian, but states that he has had increasing lower extremity edema over the past week. He denies any nausea, vomiting, diarrhea, abdominal pain, dysuria, hematuria. He complains of some chest heaviness and nonproductive cough. Upon presentation, he was noted to have WBC 17.9,, lactic acid 1.5, procalcitonin 3.43. The patient was started on intravenous vancomycin and Zosyn after blood cultures and urine cultures were obtained. fter final culture data was obtained, the patient was a Deescalated to ceftriaxone. Assessment/Plan: Sepsis -Present at the time of admission with WBC 17.9 and fever to 102.9 -sources likely due tocellulitis and possible pneumonia -continue intravenous antibiotics pending culture data -lactic acid 1.5 -Procalcitonin 3.43 -Influenza negative -3/29-Chest x-ray with interstitial prominence and patchy bilateral infiltrates  Bacteremia--group B Streptococcus -06/06/15--blood culture--group B Streptococcus -06/08/15--d/c zosyn and vanco; start ceftriaxone 2 g daily--total abx D#3 -discussed the risks, benefits, and alternatives of TEE and how that may influence duration of treatment--the patient and daughter will discuss and determine final answer in 24 hours -surveillance blood cultures  Acute on chronic systolic CHF/ischemic cardiomyopathy (HFrEF) -Daily weights -Change furosemide to 40 mg IV twice a day--through 06/07/15 -plan to switch to po lasix 06/08/15 am -Strict I's and O's -NEG 4L -NEG 6 pounds -06/07/2015 echo EF 20-25%, diffuse HK, PAP  55 -repeat CXR am 04/01--improving interstitial prominence  cellulitis right lower extremity -Continue intravenous antibiotics -improving  Atypical chest pain -EKG unchanged -Elevated but likely represent demand ischemia in the setting of cardiomyopathy  Persistent atrial fibrillation -Continue warfarin -rate controlled  Status post mitral valve replacement -Continue warfarin -INR 3.3 at time of admission  History of nonsustained ventricular tachycardia -He has been deemed not a good candidate for ICD secondary to his comorbidities -optimize electrolytes  CKD stage III -Baseline creatinine 1.2-1.5 -Monitor with diuresis  Colon cancer status post right colectomy -he follows with Dr. Benay Spice   Family Communication: Daughter updated at beside 06/08/15 Disposition Plan: Home in 1-2 days Total time 35 min; >50% spent couseling and coordinating care    Procedures/Studies: Dg Chest 2 View  06/05/2015  CLINICAL DATA:  82 year old male with weakness EXAM: CHEST  2 VIEW COMPARISON:  Radiograph dated 03/21/2014 FINDINGS: There is stable moderate cardiomegaly. Bilateral central vascular and interstitial prominence most compatible with congestive changes. Pneumonia is less likely but not excluded. There is no focal consolidation, pleural effusion, or pneumothorax. There is elevation of the left hemidiaphragm. Median sternotomy wires, mitral valve replacement, and CABG vascular clips noted. There is osteopenia with degenerative changes of the spine. No acute fracture. IMPRESSION: Stable moderate cardiomegaly with mild congestive changes. Electronically Signed   By: Anner Crete M.D.   On: 06/05/2015 21:01   Ct Head Wo Contrast  06/05/2015  CLINICAL DATA:  Altered mental status and difficulty ambulating EXAM: CT HEAD WITHOUT CONTRAST TECHNIQUE: Contiguous axial images were obtained from the base of the skull through the vertex without intravenous contrast. COMPARISON:  March 10, 2014 FINDINGS: Moderate diffuse atrophy is stable. There is no intracranial mass, hemorrhage, extra-axial fluid collection, or midline shift. There is evidence of a prior infarct in the left occipital lobe posteriorly with  sparing of the calcarine cortex. There is patchy small vessel disease in the centra semiovale bilaterally. There is evidence of a prior small infarct in the left thalamus. No new gray-white compartment lesion is identified. No acute infarct evident. The bony calvarium appears intact. The mastoid air cells are clear. There is leftward deviation the nasal septum. No intraorbital lesions are evident. IMPRESSION: Stable atrophy with periventricular small vessel disease. Prior left occipital lobe infarct as well as prior small infarct in the left thalamus. No acute infarct evident. No hemorrhage or mass effect. There is leftward deviation of the nasal septum. Electronically Signed   By: Lowella Grip III M.D.   On: 06/05/2015 20:46   Dg Chest Port 1 View  06/08/2015  CLINICAL DATA:  Congestive heart failure.  Atrial fibrillation. EXAM: PORTABLE CHEST 1 VIEW COMPARISON:  06/05/2015 FINDINGS: Marked cardiac enlargement remains stable. Bilateral airspace disease with central perihilar predominance shows improvement, consistent with decreased pulmonary edema. Chronic elevation of left hemidiaphragm again noted. No evidence of pneumothorax or pleural effusion. IMPRESSION: Congestive heart failure with improving pulmonary edema since prior study. Electronically Signed   By: Earle Gell M.D.   On: 06/08/2015 07:06         Subjective: Patient denies fevers, chills, headache, chest pain, dyspnea, nausea, vomiting, diarrhea, abdominal pain, dysuria, hematuria   Objective: Filed Vitals:   06/07/15 2121 06/08/15 0500 06/08/15 0549 06/08/15 1235  BP: 132/69  110/61 110/43  Pulse: 79  86 57  Temp: 97.7 F (36.5 C)  97.5 F (36.4 C) 97.8 F (36.6 C)  TempSrc: Oral  Oral Oral  Resp: 20  18  18   Height:      Weight:  60.918 kg (134 lb 4.8 oz)    SpO2: 98%  97% 99%    Intake/Output Summary (Last 24 hours) at 06/08/15 1803 Last data filed at 06/08/15 1746  Gross per 24 hour  Intake   1070 ml  Output   1820 ml  Net   -750 ml   Weight change: -1.782 kg (-3 lb 14.9 oz) Exam:   General:  Pt is alert, follows commands appropriately, not in acute distress  HEENT: No icterus, No thrush, No neck mass, [place]/AT  Cardiovascular: RRR, S1/S2, no rubs, no gallops  Respiratory: fine Basal crackles. No wheezing. Good air movement  Abdomen: Soft/+BS, non tender, non distended, no guarding  Extremities: trace LE edema, No lymphangitis, No petechiae, No rashes, no synovitis  Data Reviewed: Basic Metabolic Panel:  Recent Labs Lab 06/05/15 1959 06/07/15 0430 06/08/15 0505  NA 140 138 140  K 4.0 3.5 3.8  CL 108 102 102  CO2 22 25 26   GLUCOSE 104* 103* 101*  BUN 32* 33* 36*  CREATININE 1.56* 1.67* 1.53*  CALCIUM 9.1 8.7* 8.8*  MG  --  2.0  --    Liver Function Tests:  Recent Labs Lab 06/05/15 1959  AST 73*  ALT 45  ALKPHOS 81  BILITOT 1.2  PROT 6.4*  ALBUMIN 3.8   No results for input(s): LIPASE, AMYLASE in the last 168 hours. No results for input(s): AMMONIA in the last 168 hours. CBC:  Recent Labs Lab 06/05/15 1959 06/07/15 0430 06/08/15 0505  WBC 17.9* 15.4* 10.3  NEUTROABS 16.7*  --   --   HGB 11.6* 10.9* 11.9*  HCT 34.8* 33.9* 35.4*  MCV 89.9 89.4 88.7  PLT 171 127* 139*   Cardiac Enzymes:  Recent Labs Lab 06/08/15 0930  TROPONINI 0.09*   BNP: Invalid input(s):  POCBNP CBG: No results for input(s): GLUCAP in the last 168 hours.  Recent Results (from the past 240 hour(s))  Culture, blood (x 2)     Status: None (Preliminary result)   Collection Time: 06/06/15 12:44 AM  Result Value Ref Range Status   Specimen Description BLOOD RIGHT ARM  Final   Special Requests BOTTLES DRAWN AEROBIC AND ANAEROBIC 5CC  Final   Culture  Setup Time    Final    GRAM POSITIVE COCCI IN PAIRS IN CHAINS AEROBIC BOTTLE ONLY CRITICAL RESULT CALLED TO, READ BACK BY AND VERIFIED WITH: L BERGELDEDIOS RN 2107 06/06/15 A BROWNING    Culture   Final    GROUP B STREP(S.AGALACTIAE)ISOLATED SUSCEPTIBILITIES TO FOLLOW Performed at [facility]    Report Status PENDING  Incomplete  Culture, blood (x 2)     Status: None (Preliminary result)   Collection Time: 06/06/15 12:44 AM  Result Value Ref Range Status   Specimen Description BLOOD RIGHT HAND  Final   Special Requests IN PEDIATRIC BOTTLE 2CC  Final   Culture   Final    NO GROWTH 2 DAYS Performed at [facility]    Report Status PENDING  Incomplete     Scheduled Meds: . cefTRIAXone (ROCEPHIN)  IV  2 g Intravenous Q24H  . furosemide  40 mg Oral Daily  . multivitamin with minerals  1 tablet Oral Daily  . pantoprazole  40 mg Oral Daily  . simvastatin  10 mg Oral QHS  . sodium chloride flush  3 mL Intravenous Q12H  . Warfarin - Pharmacist Dosing Inpatient   Does not apply q1800   Continuous Infusions:    Keylee Shrestha, DO  [facility] Pager [phone]  If 7PM-7AM, please contact night-coverage [email or web address] Password [email or web address] 06/08/2015, 6:03 PM   LOS: 2 days

## 2015-06-08 NOTE — Progress Notes (Signed)
[place] for Warfarin Indication: atrial fibrillation, hx St Jude's MVR  No Known Allergies  Patient Measurements: Height: 5\' 7"  (170.2 cm) Weight: 134 lb 4.8 oz (60.918 kg) IBW/kg (Calculated) : 66.1  Vital Signs: Temp: 97.5 F (36.4 C) (04/01 0549) Temp Source: Oral (04/01 0549) BP: 110/61 mmHg (04/01 0549) Pulse Rate: 86 (04/01 0549)  Labs:  Recent Labs  06/05/15 1959 06/06/15 0427 06/07/15 0430 06/08/15 0505  HGB 11.6*  --  10.9* 11.9*  HCT 34.8*  --  33.9* 35.4*  PLT 171  --  127* 139*  LABPROT 32.9* 32.9* 25.7* 24.8*  INR 3.30* 3.30* 2.37* 2.26*  CREATININE 1.56*  --  1.67* 1.53*    Estimated Creatinine Clearance: 28.2 mL/min (by C-G formula based on Cr of 1.53).   PTA Warfarin dose = 2.5mg  po daily.   Assessment: Patient with Afib, hx St Jude's MVR on chronic warfarin 2.5mg .  INR therapeutic on admit. He is managed by [facility]. He is admitted with sepsis and started on IV antibiotics.    06/08/2015:  INR has decreased from admission and now subtherapeutic (2.26)  CBC low but stable, no bleeding reported.   Tolerating cardiac diet   No drug-drug interactions noted  Goal of Therapy:  INR 2.5-3.5   Plan:   Increase warfarin to 5mg  PO x 1 today - give early at 12 noon  Daily INR  Peggyann Juba, PharmD, BCPS Pager: [phone]  06/08/2015,10:34 AM

## 2015-06-08 NOTE — Progress Notes (Signed)
Patient had 6 beats of v.tach, asymptomatic.

## 2015-06-08 NOTE — Progress Notes (Signed)
Pt had 6 beat run of vtach. Pt asymptomatic.

## 2015-06-08 NOTE — Progress Notes (Signed)
This morning patient states that he has intermittant left sided shoulder and neck pain. He has not told anyone this. I asked him to please let us know when this happens. Paged Dr. Carles Collet to let him know.

## 2015-06-09 LAB — BASIC METABOLIC PANEL
Anion gap: 10 (ref 5–15)
BUN: 37 mg/dL — AB (ref 6–20)
CHLORIDE: 106 mmol/L (ref 101–111)
CO2: 27 mmol/L (ref 22–32)
CREATININE: 1.44 mg/dL — AB (ref 0.61–1.24)
Calcium: 9.1 mg/dL (ref 8.9–10.3)
GFR calc Af Amer: 48 mL/min — ABNORMAL LOW (ref >60)
GFR calc non Af Amer: 42 mL/min — ABNORMAL LOW (ref >60)
GLUCOSE: 109 mg/dL — AB (ref 65–99)
POTASSIUM: 4 mmol/L (ref 3.5–5.1)
SODIUM: 143 mmol/L (ref 135–145)

## 2015-06-09 LAB — CULTURE, BLOOD (ROUTINE X 2)

## 2015-06-09 LAB — MAGNESIUM: Magnesium: 2.5 mg/dL — ABNORMAL HIGH (ref 1.7–2.4)

## 2015-06-09 LAB — PROTIME-INR
INR: 2.55 — ABNORMAL HIGH (ref 0.00–1.49)
PROTHROMBIN TIME: 27.1 s — AB (ref 11.6–15.2)

## 2015-06-09 MED ORDER — WARFARIN SODIUM 2.5 MG PO TABS
2.5000 mg | ORAL_TABLET | Freq: Once | ORAL | Status: AC
  Administered 2015-06-09: 2.5 mg via ORAL
  Filled 2015-06-09: qty 1

## 2015-06-09 NOTE — Progress Notes (Signed)
PROGRESS NOTE  Alfred Dennis [id number] DOB: [birth year] DOA: 06/05/2015 PCP: Osborne Casco, MD Brief History 82 year old male with a history of ischemic cardiomyopathy EF 25-35 percent, hypertension, mitral valve replacement, Persistent atrial fibrillation, colon cancer status post right hemicolectomy Presents with 2 day history of fevers, chills, and shortness of breath. The patient is a difficult historian, but states that he has had increasing lower extremity edema over the past week. He denies any nausea, vomiting, diarrhea, abdominal pain, dysuria, hematuria. He complains of some chest heaviness and nonproductive cough. Upon presentation, he was noted to have WBC 17.9,, lactic acid 1.5, procalcitonin 3.43. The patient was started on intravenous vancomycin and Zosyn after blood cultures and urine cultures were obtained. After final culture data was obtained, the patient was a Deescalated to ceftriaxone. Assessment/Plan: Sepsis -Present at the time of admission with WBC 17.9 and fever to 102.9 -sources likely due tocellulitis and possible pneumonia -continue intravenous antibiotics pending culture data -lactic acid 1.5 -Procalcitonin 3.43 -Influenza negative -3/29-Chest x-ray with interstitial prominence and patchy bilateral infiltrates  Bacteremia--group B Streptococcus -06/06/15--blood culture--group B Streptococcus -06/08/15--d/c zosyn and vanco; start ceftriaxone 2 g daily--total abx D#3 -discussed the risks, benefits, and alternatives of TEE and how that may influence duration of treatment--the patient and daughter will discuss and determine final answer in 24 hours--pt and family declined TEE -plan to treat empirically for IE in the setting of mechanical valve--6 wks IV abx -surveillance blood cultures--neg at 24 hours -plan PICC line 4/4 am if cultures remain neg  Acute on chronic systolic CHF/ischemic cardiomyopathy (HFrEF) -Daily weights -Change  furosemide to 40 mg IV twice a day--through 06/07/15 -plan to switch to po lasix 06/08/15 am -Strict I's and O's -NEG 4L -NEG 7 pounds -06/07/2015 echo EF 20-25%, diffuse HK, PAP 55 -repeat CXR am 04/01--improving interstitial prominence -Appears more euvolemic  cellulitis right lower extremity -Continue intravenous antibiotics -improving  Atypical chest pain/elevated troponin -EKG unchanged -Elevated but likely represent demand ischemia in the setting of cardiomyopathy  Persistent atrial fibrillation -Continue warfarin -rate controlled  Status post mitral valve replacement -Continue warfarin -INR 3.3 at time of admission  History of nonsustained ventricular tachycardia -He has been deemed not a good candidate for ICD secondary to his comorbidities -optimize electrolytes  CKD stage III -Baseline creatinine 1.2-1.5 -Monitor with diuresis  Colon cancer status post right colectomy -he follows with Dr. Benay Spice   Family Communication: Daughter updated at beside 06/09/15 Disposition Plan: Home vs SNF 06/11/15 Total time 35 min; >50% spent couseling and coordinating care   Procedures/Studies: Dg Chest 2 View  06/05/2015  CLINICAL DATA:  82 year old male with weakness EXAM: CHEST  2 VIEW COMPARISON:  Radiograph dated 03/21/2014 FINDINGS: There is stable moderate cardiomegaly. Bilateral central vascular and interstitial prominence most compatible with congestive changes. Pneumonia is less likely but not excluded. There is no focal consolidation, pleural effusion, or pneumothorax. There is elevation of the left hemidiaphragm. Median sternotomy wires, mitral valve replacement, and CABG vascular clips noted. There is osteopenia with degenerative changes of the spine. No acute fracture. IMPRESSION: Stable moderate cardiomegaly with mild congestive changes. Electronically Signed   By: Anner Crete M.D.   On: 06/05/2015 21:01   Ct Head Wo Contrast  06/05/2015  CLINICAL DATA:   Altered mental status and difficulty ambulating EXAM: CT HEAD WITHOUT CONTRAST TECHNIQUE: Contiguous axial images were obtained from the base of the skull through the vertex without intravenous contrast. COMPARISON:  March 10, 2014 FINDINGS: Moderate diffuse atrophy is stable. There is no intracranial mass, hemorrhage, extra-axial fluid collection, or midline shift. There is evidence of a prior infarct in the left occipital lobe posteriorly with sparing of the calcarine cortex. There is patchy small vessel disease in the centra semiovale bilaterally. There is evidence of a prior small infarct in the left thalamus. No new gray-white compartment lesion is identified. No acute infarct evident. The bony calvarium appears intact. The mastoid air cells are clear. There is leftward deviation the nasal septum. No intraorbital lesions are evident. IMPRESSION: Stable atrophy with periventricular small vessel disease. Prior left occipital lobe infarct as well as prior small infarct in the left thalamus. No acute infarct evident. No hemorrhage or mass effect. There is leftward deviation of the nasal septum. Electronically Signed   By: Lowella Grip III M.D.   On: 06/05/2015 20:46   Dg Chest Port 1 View  06/08/2015  CLINICAL DATA:  Congestive heart failure.  Atrial fibrillation. EXAM: PORTABLE CHEST 1 VIEW COMPARISON:  06/05/2015 FINDINGS: Marked cardiac enlargement remains stable. Bilateral airspace disease with central perihilar predominance shows improvement, consistent with decreased pulmonary edema. Chronic elevation of left hemidiaphragm again noted. No evidence of pneumothorax or pleural effusion. IMPRESSION: Congestive heart failure with improving pulmonary edema since prior study. Electronically Signed   By: Earle Gell M.D.   On: 06/08/2015 07:06         Subjective:   Objective: Filed Vitals:   06/08/15 1235 06/08/15 2127 06/09/15 0540 06/09/15 1300  BP: 110/43 115/58 116/56 149/50  Pulse: 57 79  85 103  Temp: 97.8 F (36.6 C) 98.7 F (37.1 C) 98.3 F (36.8 C) 98 F (36.7 C)  TempSrc: Oral Oral Oral Oral  Resp: 18 18 16 18   Height:      Weight:   60.737 kg (133 lb 14.4 oz)   SpO2: 99% 97% 96% 100%    Intake/Output Summary (Last 24 hours) at 06/09/15 1735 Last data filed at 06/09/15 1057  Gross per 24 hour  Intake    600 ml  Output    626 ml  Net    -26 ml   Weight change: -0.181 kg (-6.4 oz) Exam:   General:  Pt is alert, follows commands appropriately, not in acute distress  HEENT: No icterus, No thrush, No neck mass, [place]/AT  Cardiovascular: RRR, S1/S2, no rubs, no gallops  Respiratory: CTA bilaterally, no wheezing, no crackles, no rhonchi  Abdomen: Soft/+BS, non tender, non distended, no guarding  Extremities: No edema, No lymphangitis, No petechiae, No rashes, no synovitis  Data Reviewed: Basic Metabolic Panel:  Recent Labs Lab 06/05/15 1959 06/07/15 0430 06/08/15 0505 06/09/15 0449  NA 140 138 140 143  K 4.0 3.5 3.8 4.0  CL 108 102 102 106  CO2 22 25 26 27   GLUCOSE 104* 103* 101* 109*  BUN 32* 33* 36* 37*  CREATININE 1.56* 1.67* 1.53* 1.44*  CALCIUM 9.1 8.7* 8.8* 9.1  MG  --  2.0  --  2.5*   Liver Function Tests:  Recent Labs Lab 06/05/15 1959  AST 73*  ALT 45  ALKPHOS 81  BILITOT 1.2  PROT 6.4*  ALBUMIN 3.8   No results for input(s): LIPASE, AMYLASE in the last 168 hours. No results for input(s): AMMONIA in the last 168 hours. CBC:  Recent Labs Lab 06/05/15 1959 06/07/15 0430 06/08/15 0505  WBC 17.9* 15.4* 10.3  NEUTROABS 16.7*  --   --   HGB 11.6* 10.9* 11.9*  HCT 34.8* 33.9* 35.4*  MCV 89.9 89.4 88.7  PLT 171 127* 139*   Cardiac Enzymes:  Recent Labs Lab 06/08/15 0930 06/08/15 1600 06/08/15 2136  TROPONINI 0.09* 0.08* 0.10*   BNP: Invalid input(s): POCBNP CBG: No results for input(s): GLUCAP in the last 168 hours.  Recent Results (from the past 240 hour(s))  Culture, blood (x 2)     Status: None    Collection Time: 06/06/15 12:44 AM  Result Value Ref Range Status   Specimen Description BLOOD RIGHT ARM  Final   Special Requests BOTTLES DRAWN AEROBIC AND ANAEROBIC 5CC  Final   Culture  Setup Time   Final    GRAM POSITIVE COCCI IN PAIRS IN CHAINS AEROBIC BOTTLE ONLY CRITICAL RESULT CALLED TO, READ BACK BY AND VERIFIED WITH: L BERGELDEDIOS RN 2107 06/06/15 A BROWNING    Culture   Final    GROUP B STREP(S.AGALACTIAE)ISOLATED Performed at [facility]    Report Status 06/09/2015 FINAL  Final   Organism ID, Bacteria GROUP B STREP(S.AGALACTIAE)ISOLATED  Final      Susceptibility   Group b strep(s.agalactiae)isolated - MIC*    CLINDAMYCIN >=1 RESISTANT Resistant     AMPICILLIN <=0.25 SENSITIVE Sensitive     ERYTHROMYCIN >=8 RESISTANT Resistant     VANCOMYCIN 0.5 SENSITIVE Sensitive     CEFTRIAXONE <=0.12 SENSITIVE Sensitive     LEVOFLOXACIN 0.5 SENSITIVE Sensitive     * GROUP B STREP(S.AGALACTIAE)ISOLATED  Culture, blood (x 2)     Status: None (Preliminary result)   Collection Time: 06/06/15 12:44 AM  Result Value Ref Range Status   Specimen Description BLOOD RIGHT HAND  Final   Special Requests IN PEDIATRIC BOTTLE 2CC  Final   Culture   Final    NO GROWTH 3 DAYS Performed at [facility]    Report Status PENDING  Incomplete     Scheduled Meds: . cefTRIAXone (ROCEPHIN)  IV  2 g Intravenous Q24H  . furosemide  40 mg Oral BID  . multivitamin with minerals  1 tablet Oral Daily  . pantoprazole  40 mg Oral Daily  . simvastatin  10 mg Oral QHS  . sodium chloride flush  3 mL Intravenous Q12H  . Warfarin - Pharmacist Dosing Inpatient   Does not apply q1800   Continuous Infusions:    Karlene Southard, DO  [facility] Pager [phone]  If 7PM-7AM, please contact night-coverage [email or web address] Password [email or web address] 06/09/2015, 5:35 PM   LOS: 3 days

## 2015-06-09 NOTE — Progress Notes (Signed)
[place] for Warfarin Indication: atrial fibrillation, hx St Jude's MVR  No Known Allergies  Patient Measurements: Height: 5\' 7"  (170.2 cm) Weight: 133 lb 14.4 oz (60.737 kg) IBW/kg (Calculated) : 66.1  Vital Signs: Temp: 98.3 F (36.8 C) (04/02 0540) Temp Source: Oral (04/02 0540) BP: 116/56 mmHg (04/02 0540) Pulse Rate: 85 (04/02 0540)  Labs:  Recent Labs  06/07/15 0430 06/08/15 0505 06/08/15 0930 06/08/15 1600 06/08/15 2136 06/09/15 0449  HGB 10.9* 11.9*  --   --   --   --   HCT 33.9* 35.4*  --   --   --   --   PLT 127* 139*  --   --   --   --   LABPROT 25.7* 24.8*  --   --   --  27.1*  INR 2.37* 2.26*  --   --   --  2.55*  CREATININE 1.67* 1.53*  --   --   --  1.44*  TROPONINI  --   --  0.09* 0.08* 0.10*  --     Estimated Creatinine Clearance: 29.9 mL/min (by C-G formula based on Cr of 1.44).   PTA Warfarin dose = 2.5mg  po daily.   Assessment: Patient with Afib, hx St Jude's MVR on chronic warfarin 2.5mg .  INR therapeutic on admit. He is managed by [facility]. He is admitted with sepsis and started on IV antibiotics.    06/09/2015:  INR has decreased from admission and was subtherapeutic 3/31-4/1; back in range today (2.55)  CBC low but stable yesterday, no bleeding reported.   Tolerating cardiac diet   No drug-drug interactions noted  Goal of Therapy:  INR 2.5-3.5   Plan:   Warfarin 2.5mg  PO x 1 today per home regimen  Daily INR  Peggyann Juba, PharmD, BCPS Pager: [phone]  06/09/2015,11:40 AM

## 2015-06-09 NOTE — Progress Notes (Signed)
Spoke to patient about TEE  Risks/benefits / indications He has spoken with his daughter and decided he does not want to have done.   Please call with questions

## 2015-06-10 LAB — BASIC METABOLIC PANEL
ANION GAP: 9 (ref 5–15)
BUN: 35 mg/dL — ABNORMAL HIGH (ref 6–20)
CALCIUM: 9.1 mg/dL (ref 8.9–10.3)
CO2: 27 mmol/L (ref 22–32)
Chloride: 105 mmol/L (ref 101–111)
Creatinine, Ser: 1.39 mg/dL — ABNORMAL HIGH (ref 0.61–1.24)
GFR, EST AFRICAN AMERICAN: 50 mL/min — AB (ref >60)
GFR, EST NON AFRICAN AMERICAN: 43 mL/min — AB (ref >60)
Glucose, Bld: 103 mg/dL — ABNORMAL HIGH (ref 65–99)
POTASSIUM: 3.8 mmol/L (ref 3.5–5.1)
Sodium: 141 mmol/L (ref 135–145)

## 2015-06-10 LAB — PROTIME-INR
INR: 2.62 — AB (ref 0.00–1.49)
PROTHROMBIN TIME: 27.6 s — AB (ref 11.6–15.2)

## 2015-06-10 MED ORDER — WARFARIN SODIUM 2.5 MG PO TABS
2.5000 mg | ORAL_TABLET | Freq: Once | ORAL | Status: AC
  Administered 2015-06-10: 2.5 mg via ORAL
  Filled 2015-06-10: qty 1

## 2015-06-10 MED ORDER — DEXTROSE 5 % IV SOLN: 2.0000 g | INTRAVENOUS | Status: DC

## 2015-06-10 MED ORDER — CARVEDILOL 3.125 MG PO TABS
3.1250 mg | ORAL_TABLET | Freq: Two times a day (BID) | ORAL | Status: DC
  Administered 2015-06-10 – 2015-06-11 (×2): 3.125 mg via ORAL
  Filled 2015-06-10 (×2): qty 1

## 2015-06-10 NOTE — Progress Notes (Addendum)
[place] for Warfarin Indication: atrial fibrillation, hx St Jude's MVR  No Known Allergies  Patient Measurements: Height: 5\' 7"  (170.2 cm) Weight: 129 lb 10.1 oz (58.8 kg) IBW/kg (Calculated) : 66.1  Vital Signs: Temp: 97.8 F (36.6 C) (04/03 0542) Temp Source: Oral (04/03 0542) BP: 134/63 mmHg (04/03 0542) Pulse Rate: 82 (04/03 0542)  Labs:  Recent Labs  06/08/15 0505 06/08/15 0930 06/08/15 1600 06/08/15 2136 06/09/15 0449 06/10/15 0434  HGB 11.9*  --   --   --   --   --   HCT 35.4*  --   --   --   --   --   PLT 139*  --   --   --   --   --   LABPROT 24.8*  --   --   --  27.1* 27.6*  INR 2.26*  --   --   --  2.55* 2.62*  CREATININE 1.53*  --   --   --  1.44* 1.39*  TROPONINI  --  0.09* 0.08* 0.10*  --   --     Estimated Creatinine Clearance: 30 mL/min (by C-G formula based on Cr of 1.39).   PTA Warfarin dose = 2.5mg  po daily.   Assessment: Patient with Afib, hx St Jude's MVR on chronic warfarin 2.5mg .  INR therapeutic on admit. He is managed by [facility]. He is admitted with sepsis and started on IV antibiotics.    06/10/2015:  INR therapeutic since 4/2  CBC on 4/1:  pltc low - improved, no bleeding reported.   Tolerating cardiac diet   No major drug-drug interactions noted  Goal of Therapy:  INR 2.5-3.5   Plan:   Warfarin 2.5mg  PO x 1 today per home regimen  Daily INR, CBC in am  Doreene Eland, PharmD, BCPS.   Pager: [phone] 06/10/2015 8:27 AM

## 2015-06-10 NOTE — Care Management Important Message (Signed)
Important Message  Patient Details  Name: Alfred Dennis MRN: [id number] Date of Birth: Mar 19, 1923   Medicare Important Message Given:  Yes    McGibboneyOletta Darter, RN 06/10/2015, 2:30 PM

## 2015-06-10 NOTE — Progress Notes (Signed)
Physical Therapy Treatment Patient Details Name: Alfred Dennis MRN: [id number] DOB: [birth year] Today's Date: 06-20-15    History of Present Illness 82 year old male with a history of ischemic cardiomyopathy EF 25-35 percent, hypertension, mitral valve replacement, Persistent atrial fibrillation, colon cancer status post right hemicolectomyand admitted for sepsis, R LE cellulitis    PT Comments    Pt ambulated in hallway however HR elevated to 140s upon return to room.    Follow Up Recommendations  Supervision/Assistance - 24 hour;SNF (if 24/7 assist not available at home, recommend SNF)     Equipment Recommendations  None recommended by PT    Recommendations for Other Services       Precautions / Restrictions Precautions Precautions: Fall Precaution Comments: monitor HR Restrictions Weight Bearing Restrictions: No    Mobility  Bed Mobility Overal bed mobility: Needs Assistance Bed Mobility: Sit to Supine       Sit to supine: Min assist   General bed mobility comments: slight assist for LEs onto bed  Transfers Overall transfer level: Needs assistance Equipment used: Rolling walker (2 wheeled) Transfers: Sit to/from Stand Sit to Stand: Min guard         General transfer comment: verbal cues for hand placement, increased time  Ambulation/Gait Ambulation/Gait assistance: Min guard Ambulation Distance (Feet): 200 Feet         General Gait Details: verbal cues for RW positioning, HR up to 127 bpm on telemetry monitor during gait however increased to 147 bpm upon sitting EOB, quickly returned to 120s upon return to supine   Stairs            Wheelchair Mobility    Modified Rankin (Stroke Patients Only)       Balance                                    Cognition Arousal/Alertness: Awake/alert Behavior During Therapy: WFL for tasks assessed/performed Overall Cognitive Status: Within Functional Limits for tasks assessed                       Exercises Total Joint Exercises Hip ABduction/ADduction: AROM;Both;10 reps Long Arc Quad: AROM;Both;10 reps;Seated Marching in Standing: Seated;AROM;Both;10 reps    General Comments        Pertinent Vitals/Pain Pain Assessment: No/denies pain    Home Living                      Prior Function            PT Goals (current goals can now be found in the care plan section) Progress towards PT goals: Progressing toward goals    Frequency  Min 3X/week    PT Plan Discharge plan needs to be updated    Co-evaluation             End of Session Equipment Utilized During Treatment: Gait belt Activity Tolerance: Patient tolerated treatment well Patient left: in bed;with call bell/phone within reach;with bed alarm set     Time: [id number] PT Time Calculation (min) (ACUTE ONLY): 13 min  Charges:  $Gait Training: 8-22 mins                    G Codes:      Alfred Dennis,Alfred Dennis 06/20/15, 12:03 PM Carmelia Bake, PT, DPT Jun 20, 2015 Pager: [phone]

## 2015-06-10 NOTE — Progress Notes (Signed)
Spoke with pt, his wife and daughter at bedside concerning discharge plans. [facility] was selected. Referral given to in house rep.

## 2015-06-10 NOTE — Progress Notes (Signed)
PROGRESS NOTE  FELIX CORRALES [id number] DOB: 06-06-1923 DOA: 06/05/2015 PCP: Osborne Casco, MD  Assessment/Plan: Brief History 82 year old male with a history of ischemic cardiomyopathy EF 25-35 percent, hypertension, mitral valve replacement, Persistent atrial fibrillation, colon cancer status post right hemicolectomy Presents with 2 day history of fevers, chills, and shortness of breath. The patient is a difficult historian, but states that he has had increasing lower extremity edema over the past week. He denies any nausea, vomiting, diarrhea, abdominal pain, dysuria, hematuria. He complains of some chest heaviness and nonproductive cough. Upon presentation, he was noted to have WBC 17.9,, lactic acid 1.5, procalcitonin 3.43. The patient was started on intravenous vancomycin and Zosyn after blood cultures and urine cultures were obtained. After final culture data was obtained, the patient was a Deescalated to ceftriaxone. Assessment/Plan: Sepsis -Present at the time of admission with WBC 17.9 and fever to 102.9 -sources likely due to cellulitis  -lactic acid 1.5  -Procalcitonin 3.43 -Influenza negative -3/29-Chest x-ray with interstitial prominence and patchy bilateral infiltrates  Bacteremia--group B Streptococcus -06/06/15--blood culture--group B Streptococcus -06/08/15--d/c zosyn and vanco; start ceftriaxone 2 g daily--total abx D#3 -discussed the risks, benefits, and alternatives of TEE and how that may influence duration of treatment--the patient and daughter will discuss and determine final answer in 24 hours--pt and family declined TEE -plan to treat empirically for IE in the setting of mechanical valve--6 wks IV abx -surveillance blood cultures--neg at 24 hours -plan PICC line -due to patient's mechanical mitral valve, plan IV ceftriaxone through 07/18/15 which will be 6 weeks  Acute on chronic systolic CHF/ischemic cardiomyopathy (HFrEF) -Daily  weights -Change furosemide to 40 mg IV twice a day--through 06/07/15 -plan to switch to po lasix 06/08/15 am -Strict I's and O's -NEG 4L -NEG 8 pounds -06/07/2015 echo EF 20-25%, diffuse HK, PAP 55 -repeat CXR am 04/01--improving interstitial prominence -Appears more euvolemic  cellulitis right lower extremity -Continue intravenous antibiotics -improving  Atypical chest pain/elevated troponin -EKG unchanged -Elevated but likely represent demand ischemia in the setting of cardiomyopathy  Persistent atrial fibrillation -Continue warfarin -rate controlled  Status post mitral valve replacement -Continue warfarin -INR 3.3 at time of admission  History of nonsustained ventricular tachycardia -He has been deemed not a good candidate for ICD secondary to his comorbidities -optimize electrolytes  CKD stage III -Baseline creatinine 1.2-1.5 -Monitor with diuresis  Colon cancer status post right colectomy -he follows with Dr. Benay Spice   Family Communication: Daughter updated at beside 06/10/15 Disposition Plan: Home vs SNF 06/11/15      Procedures/Studies: Dg Chest 2 View  06/05/2015  CLINICAL DATA:  82 year old male with weakness EXAM: CHEST  2 VIEW COMPARISON:  Radiograph dated 03/21/2014 FINDINGS: There is stable moderate cardiomegaly. Bilateral central vascular and interstitial prominence most compatible with congestive changes. Pneumonia is less likely but not excluded. There is no focal consolidation, pleural effusion, or pneumothorax. There is elevation of the left hemidiaphragm. Median sternotomy wires, mitral valve replacement, and CABG vascular clips noted. There is osteopenia with degenerative changes of the spine. No acute fracture. IMPRESSION: Stable moderate cardiomegaly with mild congestive changes. Electronically Signed   By: Anner Crete M.D.   On: 06/05/2015 21:01   Ct Head Wo Contrast  06/05/2015  CLINICAL DATA:  Altered mental status and difficulty  ambulating EXAM: CT HEAD WITHOUT CONTRAST TECHNIQUE: Contiguous axial images were obtained from the base of the skull through the vertex without intravenous contrast. COMPARISON:  March 10, 2014 FINDINGS: Moderate diffuse atrophy is stable. There is no intracranial mass, hemorrhage, extra-axial fluid collection, or midline shift. There is evidence of a prior infarct in the left occipital lobe posteriorly with sparing of the calcarine cortex. There is patchy small vessel disease in the centra semiovale bilaterally. There is evidence of a prior small infarct in the left thalamus. No new gray-white compartment lesion is identified. No acute infarct evident. The bony calvarium appears intact. The mastoid air cells are clear. There is leftward deviation the nasal septum. No intraorbital lesions are evident. IMPRESSION: Stable atrophy with periventricular small vessel disease. Prior left occipital lobe infarct as well as prior small infarct in the left thalamus. No acute infarct evident. No hemorrhage or mass effect. There is leftward deviation of the nasal septum. Electronically Signed   By: Lowella Grip III M.D.   On: 06/05/2015 20:46   Dg Chest Port 1 View  06/08/2015  CLINICAL DATA:  Congestive heart failure.  Atrial fibrillation. EXAM: PORTABLE CHEST 1 VIEW COMPARISON:  06/05/2015 FINDINGS: Marked cardiac enlargement remains stable. Bilateral airspace disease with central perihilar predominance shows improvement, consistent with decreased pulmonary edema. Chronic elevation of left hemidiaphragm again noted. No evidence of pneumothorax or pleural effusion. IMPRESSION: Congestive heart failure with improving pulmonary edema since prior study. Electronically Signed   By: Earle Gell M.D.   On: 06/08/2015 07:06         Subjective: Patient denies fevers, chills, headache, chest pain, dyspnea, nausea, vomiting, diarrhea, abdominal pain, dysuria, hematuria   Objective: Filed Vitals:   06/09/15 1300  06/09/15 2121 06/10/15 0542 06/10/15 1326  BP: 149/50 130/48 134/63 131/82  Pulse: 103 87 82 101  Temp: 98 F (36.7 C) 97.7 F (36.5 C) 97.8 F (36.6 C) 97.9 F (36.6 C)  TempSrc: Oral Oral Oral Oral  Resp: 18 20 20 18   Height:      Weight:   58.8 kg (129 lb 10.1 oz)   SpO2: 100% 99% 98% 100%    Intake/Output Summary (Last 24 hours) at 06/10/15 1951 Last data filed at 06/10/15 1800  Gross per 24 hour  Intake    760 ml  Output    925 ml  Net   -165 ml   Weight change: -1.937 kg (-4 lb 4.3 oz) Exam:   General:  Pt is alert, follows commands appropriately, not in acute distress  HEENT: No icterus, No thrush, No neck mass, [place]/AT  Cardiovascular: RRR, S1/S2, no rubs, no gallops  Respiratory: lungs bibasilar crackles. No wheezing. Good air movement  Abdomen: Soft/+BS, non tender, non distended, no guarding  Extremities: trace LE edema, No lymphangitis, No petechiae, No rashes, no synovitis  Data Reviewed: Basic Metabolic Panel:  Recent Labs Lab 06/05/15 1959 06/07/15 0430 06/08/15 0505 06/09/15 0449 06/10/15 0434  NA 140 138 140 143 141  K 4.0 3.5 3.8 4.0 3.8  CL 108 102 102 106 105  CO2 22 25 26 27 27   GLUCOSE 104* 103* 101* 109* 103*  BUN 32* 33* 36* 37* 35*  CREATININE 1.56* 1.67* 1.53* 1.44* 1.39*  CALCIUM 9.1 8.7* 8.8* 9.1 9.1  MG  --  2.0  --  2.5*  --    Liver Function Tests:  Recent Labs Lab 06/05/15 1959  AST 73*  ALT 45  ALKPHOS 81  BILITOT 1.2  PROT 6.4*  ALBUMIN 3.8   No results for input(s): LIPASE, AMYLASE in the last 168 hours. No results for input(s): AMMONIA in the last 168 hours. CBC:  Recent Labs Lab 06/05/15 1959 06/07/15 0430 06/08/15 0505  WBC 17.9* 15.4* 10.3  NEUTROABS 16.7*  --   --   HGB 11.6* 10.9* 11.9*  HCT 34.8* 33.9* 35.4*  MCV 89.9 89.4 88.7  PLT 171 127* 139*   Cardiac Enzymes:  Recent Labs Lab 06/08/15 0930 06/08/15 1600 06/08/15 2136  TROPONINI 0.09* 0.08* 0.10*   BNP: Invalid input(s):  POCBNP CBG: No results for input(s): GLUCAP in the last 168 hours.  Recent Results (from the past 240 hour(s))  Culture, blood (x 2)     Status: None   Collection Time: 06/06/15 12:44 AM  Result Value Ref Range Status   Specimen Description BLOOD RIGHT ARM  Final   Special Requests BOTTLES DRAWN AEROBIC AND ANAEROBIC 5CC  Final   Culture  Setup Time   Final    GRAM POSITIVE COCCI IN PAIRS IN CHAINS AEROBIC BOTTLE ONLY CRITICAL RESULT CALLED TO, READ BACK BY AND VERIFIED WITH: L BERGELDEDIOS RN 2107 06/06/15 A BROWNING    Culture   Final    GROUP B STREP(S.AGALACTIAE)ISOLATED Performed at [facility]    Report Status 06/09/2015 FINAL  Final   Organism ID, Bacteria GROUP B STREP(S.AGALACTIAE)ISOLATED  Final      Susceptibility   Group b strep(s.agalactiae)isolated - MIC*    CLINDAMYCIN >=1 RESISTANT Resistant     AMPICILLIN <=0.25 SENSITIVE Sensitive     ERYTHROMYCIN >=8 RESISTANT Resistant     VANCOMYCIN 0.5 SENSITIVE Sensitive     CEFTRIAXONE <=0.12 SENSITIVE Sensitive     LEVOFLOXACIN 0.5 SENSITIVE Sensitive     * GROUP B STREP(S.AGALACTIAE)ISOLATED  Culture, blood (x 2)     Status: None (Preliminary result)   Collection Time: 06/06/15 12:44 AM  Result Value Ref Range Status   Specimen Description BLOOD RIGHT HAND  Final   Special Requests IN PEDIATRIC BOTTLE 2CC  Final   Culture   Final    NO GROWTH 4 DAYS Performed at [facility]    Report Status PENDING  Incomplete  Culture, blood (Routine X 2) w Reflex to ID Panel     Status: None (Preliminary result)   Collection Time: 06/08/15  6:32 PM  Result Value Ref Range Status   Specimen Description BLOOD RIGHT ARM  Final   Special Requests BOTTLES DRAWN AEROBIC AND ANAEROBIC 5CC  Final   Culture   Final    NO GROWTH 1 DAY Performed at [facility]    Report Status PENDING  Incomplete  Culture, blood (Routine X 2) w Reflex to ID Panel     Status: None (Preliminary result)   Collection Time:  06/08/15  6:34 PM  Result Value Ref Range Status   Specimen Description BLOOD RIGHT HAND  Final   Special Requests BOTTLES DRAWN AEROBIC AND ANAEROBIC 5CC  Final   Culture   Final    NO GROWTH 1 DAY Performed at [facility]    Report Status PENDING  Incomplete     Scheduled Meds: . carvedilol  3.125 mg Oral BID WC  . cefTRIAXone (ROCEPHIN)  IV  2 g Intravenous Q24H  . furosemide  40 mg Oral BID  . multivitamin with minerals  1 tablet Oral Daily  . pantoprazole  40 mg Oral Daily  . simvastatin  10 mg Oral QHS  . sodium chloride flush  3 mL Intravenous Q12H  . Warfarin - Pharmacist Dosing Inpatient   Does not apply q1800   Continuous Infusions:    Mariafernanda Hendricksen,  DO  [facility] Pager [phone]  If 7PM-7AM, please contact night-coverage [email or web address] Password [email or web address] 06/10/2015, 7:51 PM   LOS: 4 days

## 2015-06-11 ENCOUNTER — Inpatient Hospital Stay (HOSPITAL_COMMUNITY): Payer: Medicare Other

## 2015-06-11 LAB — BASIC METABOLIC PANEL
ANION GAP: 9 (ref 5–15)
BUN: 32 mg/dL — ABNORMAL HIGH (ref 6–20)
CALCIUM: 9 mg/dL (ref 8.9–10.3)
CO2: 27 mmol/L (ref 22–32)
Chloride: 106 mmol/L (ref 101–111)
Creatinine, Ser: 1.35 mg/dL — ABNORMAL HIGH (ref 0.61–1.24)
GFR, EST AFRICAN AMERICAN: 52 mL/min — AB (ref >60)
GFR, EST NON AFRICAN AMERICAN: 45 mL/min — AB (ref >60)
GLUCOSE: 100 mg/dL — AB (ref 65–99)
Potassium: 3.7 mmol/L (ref 3.5–5.1)
SODIUM: 142 mmol/L (ref 135–145)

## 2015-06-11 LAB — CBC
HCT: 36.5 % — ABNORMAL LOW (ref 39.0–52.0)
Hemoglobin: 11.8 g/dL — ABNORMAL LOW (ref 13.0–17.0)
MCH: 29.9 pg (ref 26.0–34.0)
MCHC: 32.3 g/dL (ref 30.0–36.0)
MCV: 92.6 fL (ref 78.0–100.0)
PLATELETS: 185 10*3/uL (ref 150–400)
RBC: 3.94 MIL/uL — ABNORMAL LOW (ref 4.22–5.81)
RDW: 14.9 % (ref 11.5–15.5)
WBC: 7.4 10*3/uL (ref 4.0–10.5)

## 2015-06-11 LAB — CULTURE, BLOOD (ROUTINE X 2): Culture: NO GROWTH

## 2015-06-11 LAB — PROTIME-INR
INR: 2.61 — AB (ref 0.00–1.49)
PROTHROMBIN TIME: 27.6 s — AB (ref 11.6–15.2)

## 2015-06-11 MED ORDER — HEPARIN SOD (PORK) LOCK FLUSH 100 UNIT/ML IV SOLN
250.0000 [IU] | Freq: Every day | INTRAVENOUS | Status: DC
  Filled 2015-06-11: qty 3

## 2015-06-11 MED ORDER — CARVEDILOL 3.125 MG PO TABS: 3.1250 mg | ORAL_TABLET | Freq: Two times a day (BID) | ORAL | Status: DC

## 2015-06-11 MED ORDER — SODIUM CHLORIDE 0.9% FLUSH: 10.0000 mL | Freq: Two times a day (BID) | INTRAVENOUS | Status: DC

## 2015-06-11 MED ORDER — HEPARIN SOD (PORK) LOCK FLUSH 100 UNIT/ML IV SOLN
250.0000 [IU] | INTRAVENOUS | Status: DC | PRN
  Administered 2015-06-11: 250 [IU]
  Filled 2015-06-11 (×2): qty 3

## 2015-06-11 MED ORDER — SODIUM CHLORIDE 0.9% FLUSH
10.0000 mL | INTRAVENOUS | Status: DC | PRN
  Administered 2015-06-11: 10 mL
  Filled 2015-06-11: qty 40

## 2015-06-11 MED ORDER — WARFARIN SODIUM 2.5 MG PO TABS: 2.5000 mg | ORAL_TABLET | Freq: Once | ORAL | Status: DC

## 2015-06-11 MED ORDER — POTASSIUM CHLORIDE ER 10 MEQ PO TBCR: 10.0000 meq | EXTENDED_RELEASE_TABLET | Freq: Every day | ORAL | Status: DC

## 2015-06-11 NOTE — Progress Notes (Signed)
Peripherally Inserted Central Catheter/Midline Placement  The IV Nurse has discussed with the patient and/or persons authorized to consent for the patient, the purpose of this procedure and the potential benefits and risks involved with this procedure.  The benefits include less needle sticks, lab draws from the catheter and patient may be discharged home with the catheter.  Risks include, but not limited to, infection, bleeding, blood clot (thrombus formation), and puncture of an artery; nerve damage and irregular heat beat.  Alternatives to this procedure were also discussed.  PICC/Midline Placement Documentation  PICC Single Lumen 06/11/15 PICC Right Brachial 35 cm 0 cm (Active)  Indication for Insertion or Continuance of Line Home intravenous therapies (PICC only) 06/11/2015 11:15 AM  Exposed Catheter (cm) 0 cm 06/11/2015 11:15 AM  Site Assessment Clean;Dry;Intact 06/11/2015 11:15 AM  Line Status Flushed;Saline locked;Blood return noted 06/11/2015 11:15 AM  Dressing Type Transparent 06/11/2015 11:15 AM  Dressing Status Clean;Dry;Intact 06/11/2015 11:15 AM  Dressing Change Due 06/18/15 06/11/2015 11:15 AM       Gordan Payment 06/11/2015, 11:16 AM

## 2015-06-11 NOTE — Progress Notes (Signed)
[place] for Warfarin Indication: atrial fibrillation, hx St Jude's MVR  No Known Allergies  Patient Measurements: Height: 5\' 7"  (170.2 cm) Weight: 129 lb 6.6 oz (58.7 kg) IBW/kg (Calculated) : 66.1  Vital Signs: Temp: 98.2 F (36.8 C) (04/04 0640) Temp Source: Oral (04/04 0640) BP: 124/66 mmHg (04/04 0640) Pulse Rate: 95 (04/04 0640)  Labs:  Recent Labs  06/08/15 0930 06/08/15 1600 06/08/15 2136 06/09/15 0449 06/10/15 0434 06/11/15 0438  HGB  --   --   --   --   --  11.8*  HCT  --   --   --   --   --  36.5*  PLT  --   --   --   --   --  185  LABPROT  --   --   --  27.1* 27.6* 27.6*  INR  --   --   --  2.55* 2.62* 2.61*  CREATININE  --   --   --  1.44* 1.39* 1.35*  TROPONINI 0.09* 0.08* 0.10*  --   --   --     Estimated Creatinine Clearance: 30.8 mL/min (by C-G formula based on Cr of 1.35).   PTA Warfarin dose = 2.5mg  po daily.   Assessment: Patient with Afib, hx St Jude's MVR on chronic warfarin 2.5mg .  INR therapeutic on admit. He is managed by [facility]. He is admitted with sepsis and started on IV antibiotics.    06/11/2015:  INR therapeutic since 4/2  CBC: Hgb low/stable, ptlc now WNL. no bleeding reported.   Tolerating cardiac diet   No major drug-drug interactions noted  Goal of Therapy:  INR 2.5-3.5   Plan:   Warfarin 2.5mg  PO x 1 today per home regimen  Daily INR, CBC in am  Per [facility] - plan home with total 6-week course of ceftriaxone for empiric tx of IE (w/ mechanical valve) since patient declined TEE.   Doreene Eland, PharmD, BCPS.   Pager: [phone] 06/11/2015 7:23 AM

## 2015-06-11 NOTE — Consult Note (Signed)
   [facility] CM Inpatient Consult   06/11/2015  SHAWNEE PETZOLD [birth year] [id number]   Patient screened for [place] Management services. Went to bedside to offer and explain [facility] Care Management program with patient. Patient was sleeping soundly. Family was not at bedside at the time. Will follow up at later time. Will make inpatient RNCM aware.   Marthenia Rolling, MSN-Ed, RN,BSN [facility] Liaison [phone]

## 2015-06-11 NOTE — Consult Note (Signed)
   [facility] CM Inpatient Consult   06/11/2015  GOBLE ESTELA October 24, 1923 [id number]   Spoke with inpatient RNCM and MD prior to bedside visit again. Mr. Wittkopp now awake. Discussed [facility] Care Management program. Patient asked that writer contact his daughter, [name] at [phone]. Spoke with [name] who states that she is not certain that Mr. Eiche will need [facility] Care Management follow up at this time. She pleasantly declines. [name] states she thinks home health will be sufficient post discharge. Made her aware that [place] Management brochure will be left for her to call in the future if needed. Made inpatient RNCM aware that [place] Management services were declined.    Marthenia Rolling, MSN-Ed, RN,BSN [facility] Liaison [phone]

## 2015-06-11 NOTE — Discharge Summary (Addendum)
Physician Discharge Summary  GURLEY SCHUYLER [id number] DOB: Mar 08, 1924 DOA: 06/05/2015  PCP: Osborne Casco, MD  Admit date: 06/05/2015 Discharge date: 06/11/2015  Recommendations for Outpatient Follow-up:  1. Pt will need to follow up with PCP in 2 weeks post discharge 2. Please obtain BMP and CBC in 1-2 weeks 3. Ceftriaxone 2 grams daily through 07/16/15 4. Discontinue picc line after last dose abx on 07/16/15 5. Check INR on 06/17/15 and adjust warfarin dose accordingly Discharge Diagnoses:  Sepsis -Present at the time of admission with WBC 17.9 and fever to 102.9 -due to cellulitis and group B streptococcus bacteremia -lactic acid 1.5  -Procalcitonin 3.43 -Influenza negative -3/29-Chest x-ray with interstitial prominence and patchy bilateral infiltrates  Bacteremia--group B Streptococcus -06/06/15--blood culture--group B Streptococcus -06/08/15--d/c zosyn and vanco; start ceftriaxone 2 g daily--total abx D#3 -discussed the risks, benefits, and alternatives of TEE and how that may influence duration of treatment--the patient and daughter will discuss and determine final answer in 24 hours--pt and family declined TEE -plan to treat empirically for IE in the setting of mechanical valve--6 wks IV abx -surveillance blood cultures--neg at 24 hours -plan PICC line -due to patient's mechanical mitral valve, plan IV ceftriaxone through 07/16/15 which will be 6 weeks  Acute on chronic systolic CHF/ischemic cardiomyopathy (HFrEF) -Daily weights -Change furosemide to 40 mg IV twice a day--through 06/07/15 -switch to po lasix 06/08/15 am--Home with furosemide 40 mg po twice a day -Strict I's and O's -NEG 4L -NEG 8 pounds--d/c weight 129 lbs -06/07/2015 echo EF 20-25%, diffuse HK, PAP 55 -repeat CXR am 04/01--improving interstitial prominence -Appears more euvolemic -start coreg 3.125 mg bid  cellulitis right lower extremity -Continue intravenous antibiotics -improving  Atypical  chest pain/elevated troponin -EKG unchanged -Elevated but likely represent demand ischemia in the setting of cardiomyopathy  Persistent atrial fibrillation -Continue warfarin -rate controlled  Status post mitral valve replacement -Continue warfarin -INR 3.3 at time of admission  History of nonsustained ventricular tachycardia -He has been deemed not a good candidate for ICD secondary to his comorbidities -optimize electrolytes  CKD stage III -Baseline creatinine 1.2-1.5 -Monitor with diuresis  Colon cancer status post right colectomy -he follows with Dr. Benay Spice  Discharge Condition: stable  Disposition:  Follow-up Information    Follow up with Osborne Casco, MD In 1 week.   Specialty:  Family Medicine   Contact information:   [address] [place] [address] [phone]       Follow up with Sueanne Margarita, MD In 2 weeks.   Specialty:  Cardiology   Contact information:   [id number] [address] [phone]     home  Diet:heart healthy Wt Readings from Last 3 Encounters:  06/11/15 58.7 kg (129 lb 6.6 oz)  03/20/15 65.137 kg (143 lb 9.6 oz)  10/02/14 63.05 kg (139 lb)    History of present illness:  82 year old male with a history of ischemic cardiomyopathy EF 25-35 percent, hypertension, mitral valve replacement, Persistent atrial fibrillation, colon cancer status post right hemicolectomy Presents with 2 day history of fevers, chills, and shortness of breath. The patient is a difficult historian, but states that he has had increasing lower extremity edema over the past week. He denies any nausea, vomiting, diarrhea, abdominal pain, dysuria, hematuria. He complains of some chest heaviness and nonproductive cough. Upon presentation, he was noted to have WBC 17.9,, lactic acid 1.5, procalcitonin 3.43. The patient was started on intravenous vancomycin and Zosyn after blood cultures and  urine cultures were  obtained. After final culture data was obtained, the patient was a Deescalated to ceftriaxone.  Pt declined TEE.  Pt will be treated with 6 weeks ceftriaxone due to his mechanical mitral valve.  He clinically improved with IV lasix and he was transitioned to po lasix  Discharge Exam: Filed Vitals:   06/10/15 2138 06/11/15 0640  BP: 115/60 124/66  Pulse: 72 95  Temp: 97.9 F (36.6 C) 98.2 F (36.8 C)  Resp: 18 20   Filed Vitals:   06/10/15 0542 06/10/15 1326 06/10/15 2138 06/11/15 0640  BP: 134/63 131/82 115/60 124/66  Pulse: 82 101 72 95  Temp: 97.8 F (36.6 C) 97.9 F (36.6 C) 97.9 F (36.6 C) 98.2 F (36.8 C)  TempSrc: Oral Oral Oral Oral  Resp: 20 18 18 20   Height:      Weight: 58.8 kg (129 lb 10.1 oz)   58.7 kg (129 lb 6.6 oz)  SpO2: 98% 100% 98% 100%   General: Awake and alert, NAD, pleasant, cooperative Cardiovascular: RRR, no rub, no gallop, no S3 Respiratory: fine bibasilar crackles. Abdomen:soft, nontender, nondistended, positive bowel sounds Extremities: trace LE edema, No lymphangitis, no petechiae  Discharge Instructions      Discharge Instructions    Diet - low sodium heart healthy    Complete by:  As directed      Increase activity slowly    Complete by:  As directed             Medication List    STOP taking these medications        hydrALAZINE 25 MG tablet  Commonly known as:  APRESOLINE     metoprolol 50 MG tablet  Commonly known as:  LOPRESSOR     pantoprazole 40 MG tablet  Commonly known as:  PROTONIX     traMADol 50 MG tablet  Commonly known as:  ULTRAM      TAKE these medications        acetaminophen 325 MG tablet  Commonly known as:  TYLENOL  Take 2 tablets (650 mg total) by mouth every 6 (six) hours as needed for mild pain (or Fever >/= 101).     carvedilol 3.125 MG tablet  Commonly known as:  COREG  Take 1 tablet (3.125 mg total) by mouth 2 (two) times daily with a meal.     cefTRIAXone 2 g in dextrose 5 % 50 mL  Inject  2 g into the vein daily. Last dose on 07/16/15  Start taking on:  06/12/2015     furosemide 40 MG tablet  Commonly known as:  LASIX  Take 1 tablet (40 mg total) by mouth 2 (two) times daily.     multivitamin with minerals Tabs tablet  Take 1 tablet by mouth daily.     polyethylene glycol packet  Commonly known as:  MIRALAX / GLYCOLAX  Take 17 g by mouth daily as needed for mild constipation.     potassium chloride 10 MEQ tablet  Commonly known as:  K-DUR  Take 1 tablet (10 mEq total) by mouth daily.     senna-docusate 8.6-50 MG tablet  Commonly known as:  Senokot-S  Take 2 tablets by mouth 2 (two) times daily.     simvastatin 10 MG tablet  Commonly known as:  ZOCOR  Take 10 mg by mouth at bedtime.     warfarin 5 MG tablet  Commonly known as:  COUMADIN  Take 2.5 mg by mouth daily. Daily  warfarin 5 MG tablet  Commonly known as:  COUMADIN  TAKE AS DIRECTED BY COUMADIN CLINIC         The results of significant diagnostics from this hospitalization (including imaging, microbiology, ancillary and laboratory) are listed below for reference.    Significant Diagnostic Studies: Dg Chest 2 View  06/05/2015  CLINICAL DATA:  82 year old male with weakness EXAM: CHEST  2 VIEW COMPARISON:  Radiograph dated 03/21/2014 FINDINGS: There is stable moderate cardiomegaly. Bilateral central vascular and interstitial prominence most compatible with congestive changes. Pneumonia is less likely but not excluded. There is no focal consolidation, pleural effusion, or pneumothorax. There is elevation of the left hemidiaphragm. Median sternotomy wires, mitral valve replacement, and CABG vascular clips noted. There is osteopenia with degenerative changes of the spine. No acute fracture. IMPRESSION: Stable moderate cardiomegaly with mild congestive changes. Electronically Signed   By: Anner Crete M.D.   On: 06/05/2015 21:01   Ct Head Wo Contrast  06/05/2015  CLINICAL DATA:  Altered mental status  and difficulty ambulating EXAM: CT HEAD WITHOUT CONTRAST TECHNIQUE: Contiguous axial images were obtained from the base of the skull through the vertex without intravenous contrast. COMPARISON:  March 10, 2014 FINDINGS: Moderate diffuse atrophy is stable. There is no intracranial mass, hemorrhage, extra-axial fluid collection, or midline shift. There is evidence of a prior infarct in the left occipital lobe posteriorly with sparing of the calcarine cortex. There is patchy small vessel disease in the centra semiovale bilaterally. There is evidence of a prior small infarct in the left thalamus. No new gray-white compartment lesion is identified. No acute infarct evident. The bony calvarium appears intact. The mastoid air cells are clear. There is leftward deviation the nasal septum. No intraorbital lesions are evident. IMPRESSION: Stable atrophy with periventricular small vessel disease. Prior left occipital lobe infarct as well as prior small infarct in the left thalamus. No acute infarct evident. No hemorrhage or mass effect. There is leftward deviation of the nasal septum. Electronically Signed   By: Lowella Grip III M.D.   On: 06/05/2015 20:46   Dg Chest Port 1 View  06/11/2015  CLINICAL DATA:  Line placement EXAM: PORTABLE CHEST 1 VIEW COMPARISON:  06/08/2015 FINDINGS: Right upper extremity PICC placed. Tip is at the cavoatrial junction. Severe cardiomegaly is unchanged. Left hemidiaphragm remains elevated. Lungs remain grossly clear. Vascular congestion has nearly resolved. IMPRESSION: Right upper extremity PICC placed with its tip at the cavoatrial junction. Vascular congestion nearly resolved. Electronically Signed   By: Marybelle Killings M.D.   On: 06/11/2015 11:53   Dg Chest Port 1 View  06/08/2015  CLINICAL DATA:  Congestive heart failure.  Atrial fibrillation. EXAM: PORTABLE CHEST 1 VIEW COMPARISON:  06/05/2015 FINDINGS: Marked cardiac enlargement remains stable. Bilateral airspace disease with  central perihilar predominance shows improvement, consistent with decreased pulmonary edema. Chronic elevation of left hemidiaphragm again noted. No evidence of pneumothorax or pleural effusion. IMPRESSION: Congestive heart failure with improving pulmonary edema since prior study. Electronically Signed   By: Earle Gell M.D.   On: 06/08/2015 07:06     Microbiology: Recent Results (from the past 240 hour(s))  Culture, blood (x 2)     Status: None   Collection Time: 06/06/15 12:44 AM  Result Value Ref Range Status   Specimen Description BLOOD RIGHT ARM  Final   Special Requests BOTTLES DRAWN AEROBIC AND ANAEROBIC 5CC  Final   Culture  Setup Time   Final    GRAM POSITIVE COCCI IN PAIRS IN  CHAINS AEROBIC BOTTLE ONLY CRITICAL RESULT CALLED TO, READ BACK BY AND VERIFIED WITH: L BERGELDEDIOS RN [id number] 06/06/15 A BROWNING    Culture   Final    GROUP B STREP(S.AGALACTIAE)ISOLATED Performed at [facility]    Report Status 06/09/2015 FINAL  Final   Organism ID, Bacteria GROUP B STREP(S.AGALACTIAE)ISOLATED  Final      Susceptibility   Group b strep(s.agalactiae)isolated - MIC*    CLINDAMYCIN >=1 RESISTANT Resistant     AMPICILLIN <=0.25 SENSITIVE Sensitive     ERYTHROMYCIN >=8 RESISTANT Resistant     VANCOMYCIN 0.5 SENSITIVE Sensitive     CEFTRIAXONE <=0.12 SENSITIVE Sensitive     LEVOFLOXACIN 0.5 SENSITIVE Sensitive     * GROUP B STREP(S.AGALACTIAE)ISOLATED  Culture, blood (x 2)     Status: None   Collection Time: 06/06/15 12:44 AM  Result Value Ref Range Status   Specimen Description BLOOD RIGHT HAND  Final   Special Requests IN PEDIATRIC BOTTLE 2CC  Final   Culture   Final    NO GROWTH 5 DAYS Performed at [facility]    Report Status 06/11/2015 FINAL  Final  Culture, blood (Routine X 2) w Reflex to ID Panel     Status: None (Preliminary result)   Collection Time: 06/08/15  6:32 PM  Result Value Ref Range Status   Specimen Description BLOOD RIGHT ARM  Final   Special  Requests BOTTLES DRAWN AEROBIC AND ANAEROBIC 5CC  Final   Culture   Final    NO GROWTH 2 DAYS Performed at [facility]    Report Status PENDING  Incomplete  Culture, blood (Routine X 2) w Reflex to ID Panel     Status: None (Preliminary result)   Collection Time: 06/08/15  6:34 PM  Result Value Ref Range Status   Specimen Description BLOOD RIGHT HAND  Final   Special Requests BOTTLES DRAWN AEROBIC AND ANAEROBIC 5CC  Final   Culture   Final    NO GROWTH 2 DAYS Performed at [facility]    Report Status PENDING  Incomplete     Labs: Basic Metabolic Panel:  Recent Labs Lab 06/07/15 0430 06/08/15 0505 06/09/15 0449 06/10/15 0434 06/11/15 0438  NA 138 140 143 141 142  K 3.5 3.8 4.0 3.8 3.7  CL 102 102 106 105 106  CO2 25 26 27 27 27   GLUCOSE 103* 101* 109* 103* 100*  BUN 33* 36* 37* 35* 32*  CREATININE 1.67* 1.53* 1.44* 1.39* 1.35*  CALCIUM 8.7* 8.8* 9.1 9.1 9.0  MG 2.0  --  2.5*  --   --    Liver Function Tests:  Recent Labs Lab 06/05/15 1959  AST 73*  ALT 45  ALKPHOS 81  BILITOT 1.2  PROT 6.4*  ALBUMIN 3.8   No results for input(s): LIPASE, AMYLASE in the last 168 hours. No results for input(s): AMMONIA in the last 168 hours. CBC:  Recent Labs Lab 06/05/15 1959 06/07/15 0430 06/08/15 0505 06/11/15 0438  WBC 17.9* 15.4* 10.3 7.4  NEUTROABS 16.7*  --   --   --   HGB 11.6* 10.9* 11.9* 11.8*  HCT 34.8* 33.9* 35.4* 36.5*  MCV 89.9 89.4 88.7 92.6  PLT 171 127* 139* 185   Cardiac Enzymes:  Recent Labs Lab 06/08/15 0930 06/08/15 1600 06/08/15 2136  TROPONINI 0.09* 0.08* 0.10*   BNP: Invalid input(s): POCBNP CBG: No results for input(s): GLUCAP in the last 168 hours.  Time coordinating discharge:  Greater than 30 minutes  Signed:  Arryana Tolleson, DO [facility] Pager: [phone] 06/11/2015, 2:28 PM

## 2015-06-12 DIAGNOSIS — I129 Hypertensive chronic kidney disease with stage 1 through stage 4 chronic kidney disease, or unspecified chronic kidney disease: Secondary | ICD-10-CM | POA: Diagnosis not present

## 2015-06-12 DIAGNOSIS — L03115 Cellulitis of right lower limb: Secondary | ICD-10-CM | POA: Diagnosis not present

## 2015-06-12 DIAGNOSIS — Z87891 Personal history of nicotine dependence: Secondary | ICD-10-CM | POA: Diagnosis not present

## 2015-06-12 DIAGNOSIS — N183 Chronic kidney disease, stage 3 (moderate): Secondary | ICD-10-CM | POA: Diagnosis not present

## 2015-06-12 DIAGNOSIS — E785 Hyperlipidemia, unspecified: Secondary | ICD-10-CM | POA: Diagnosis not present

## 2015-06-12 DIAGNOSIS — I420 Dilated cardiomyopathy: Secondary | ICD-10-CM | POA: Diagnosis not present

## 2015-06-12 DIAGNOSIS — Z452 Encounter for adjustment and management of vascular access device: Secondary | ICD-10-CM | POA: Diagnosis not present

## 2015-06-12 DIAGNOSIS — D649 Anemia, unspecified: Secondary | ICD-10-CM | POA: Diagnosis not present

## 2015-06-12 DIAGNOSIS — I5021 Acute systolic (congestive) heart failure: Secondary | ICD-10-CM | POA: Diagnosis not present

## 2015-06-12 DIAGNOSIS — I2510 Atherosclerotic heart disease of native coronary artery without angina pectoris: Secondary | ICD-10-CM | POA: Diagnosis not present

## 2015-06-12 DIAGNOSIS — Z951 Presence of aortocoronary bypass graft: Secondary | ICD-10-CM | POA: Diagnosis not present

## 2015-06-12 DIAGNOSIS — I255 Ischemic cardiomyopathy: Secondary | ICD-10-CM | POA: Diagnosis not present

## 2015-06-12 DIAGNOSIS — Z7901 Long term (current) use of anticoagulants: Secondary | ICD-10-CM | POA: Diagnosis not present

## 2015-06-12 DIAGNOSIS — Z85038 Personal history of other malignant neoplasm of large intestine: Secondary | ICD-10-CM | POA: Diagnosis not present

## 2015-06-12 DIAGNOSIS — Z5181 Encounter for therapeutic drug level monitoring: Secondary | ICD-10-CM | POA: Diagnosis not present

## 2015-06-13 DIAGNOSIS — I255 Ischemic cardiomyopathy: Secondary | ICD-10-CM | POA: Diagnosis not present

## 2015-06-13 DIAGNOSIS — I129 Hypertensive chronic kidney disease with stage 1 through stage 4 chronic kidney disease, or unspecified chronic kidney disease: Secondary | ICD-10-CM | POA: Diagnosis not present

## 2015-06-13 DIAGNOSIS — L03115 Cellulitis of right lower limb: Secondary | ICD-10-CM | POA: Diagnosis not present

## 2015-06-13 DIAGNOSIS — I2510 Atherosclerotic heart disease of native coronary artery without angina pectoris: Secondary | ICD-10-CM | POA: Diagnosis not present

## 2015-06-13 DIAGNOSIS — I5021 Acute systolic (congestive) heart failure: Secondary | ICD-10-CM | POA: Diagnosis not present

## 2015-06-13 DIAGNOSIS — N183 Chronic kidney disease, stage 3 (moderate): Secondary | ICD-10-CM | POA: Diagnosis not present

## 2015-06-14 LAB — CULTURE, BLOOD (ROUTINE X 2)
CULTURE: NO GROWTH
Culture: NO GROWTH

## 2015-06-17 ENCOUNTER — Ambulatory Visit (INDEPENDENT_AMBULATORY_CARE_PROVIDER_SITE_OTHER): Payer: Medicare Other

## 2015-06-17 DIAGNOSIS — L03115 Cellulitis of right lower limb: Secondary | ICD-10-CM | POA: Diagnosis not present

## 2015-06-17 DIAGNOSIS — Z5181 Encounter for therapeutic drug level monitoring: Secondary | ICD-10-CM

## 2015-06-17 DIAGNOSIS — I059 Rheumatic mitral valve disease, unspecified: Secondary | ICD-10-CM

## 2015-06-17 DIAGNOSIS — I129 Hypertensive chronic kidney disease with stage 1 through stage 4 chronic kidney disease, or unspecified chronic kidney disease: Secondary | ICD-10-CM | POA: Diagnosis not present

## 2015-06-17 DIAGNOSIS — N183 Chronic kidney disease, stage 3 (moderate): Secondary | ICD-10-CM | POA: Diagnosis not present

## 2015-06-17 DIAGNOSIS — A419 Sepsis, unspecified organism: Secondary | ICD-10-CM | POA: Diagnosis not present

## 2015-06-17 DIAGNOSIS — I5021 Acute systolic (congestive) heart failure: Secondary | ICD-10-CM | POA: Diagnosis not present

## 2015-06-17 DIAGNOSIS — I2510 Atherosclerotic heart disease of native coronary artery without angina pectoris: Secondary | ICD-10-CM | POA: Diagnosis not present

## 2015-06-17 DIAGNOSIS — I255 Ischemic cardiomyopathy: Secondary | ICD-10-CM | POA: Diagnosis not present

## 2015-06-17 LAB — POCT INR: INR: 2.8

## 2015-06-18 ENCOUNTER — Telehealth: Payer: Self-pay | Admitting: Cardiology

## 2015-06-18 NOTE — Telephone Encounter (Signed)
Per hospital discharge notes, patient to be seen by Dr. Radford Pax in 2 weeks. Scheduled patient 4/18 with Dr. Radford Pax. DPR was grateful for call.

## 2015-06-18 NOTE — Telephone Encounter (Signed)
Pt's dtr calling re pt being  in [place] last week and needs to know if he needs a sooner follow up appt-pls call dtr [place]

## 2015-06-20 DIAGNOSIS — L03115 Cellulitis of right lower limb: Secondary | ICD-10-CM | POA: Diagnosis not present

## 2015-06-20 DIAGNOSIS — I255 Ischemic cardiomyopathy: Secondary | ICD-10-CM | POA: Diagnosis not present

## 2015-06-20 DIAGNOSIS — N183 Chronic kidney disease, stage 3 (moderate): Secondary | ICD-10-CM | POA: Diagnosis not present

## 2015-06-20 DIAGNOSIS — I129 Hypertensive chronic kidney disease with stage 1 through stage 4 chronic kidney disease, or unspecified chronic kidney disease: Secondary | ICD-10-CM | POA: Diagnosis not present

## 2015-06-20 DIAGNOSIS — I2510 Atherosclerotic heart disease of native coronary artery without angina pectoris: Secondary | ICD-10-CM | POA: Diagnosis not present

## 2015-06-20 DIAGNOSIS — I5021 Acute systolic (congestive) heart failure: Secondary | ICD-10-CM | POA: Diagnosis not present

## 2015-06-21 DIAGNOSIS — N183 Chronic kidney disease, stage 3 (moderate): Secondary | ICD-10-CM | POA: Diagnosis not present

## 2015-06-21 DIAGNOSIS — L03115 Cellulitis of right lower limb: Secondary | ICD-10-CM | POA: Diagnosis not present

## 2015-06-21 DIAGNOSIS — I5021 Acute systolic (congestive) heart failure: Secondary | ICD-10-CM | POA: Diagnosis not present

## 2015-06-21 DIAGNOSIS — I129 Hypertensive chronic kidney disease with stage 1 through stage 4 chronic kidney disease, or unspecified chronic kidney disease: Secondary | ICD-10-CM | POA: Diagnosis not present

## 2015-06-21 DIAGNOSIS — I255 Ischemic cardiomyopathy: Secondary | ICD-10-CM | POA: Diagnosis not present

## 2015-06-21 DIAGNOSIS — I2510 Atherosclerotic heart disease of native coronary artery without angina pectoris: Secondary | ICD-10-CM | POA: Diagnosis not present

## 2015-06-24 ENCOUNTER — Ambulatory Visit (INDEPENDENT_AMBULATORY_CARE_PROVIDER_SITE_OTHER): Payer: Medicare Other | Admitting: Cardiovascular Disease

## 2015-06-24 DIAGNOSIS — I5021 Acute systolic (congestive) heart failure: Secondary | ICD-10-CM | POA: Diagnosis not present

## 2015-06-24 DIAGNOSIS — I129 Hypertensive chronic kidney disease with stage 1 through stage 4 chronic kidney disease, or unspecified chronic kidney disease: Secondary | ICD-10-CM | POA: Diagnosis not present

## 2015-06-24 DIAGNOSIS — I059 Rheumatic mitral valve disease, unspecified: Secondary | ICD-10-CM

## 2015-06-24 DIAGNOSIS — L03115 Cellulitis of right lower limb: Secondary | ICD-10-CM | POA: Diagnosis not present

## 2015-06-24 DIAGNOSIS — I255 Ischemic cardiomyopathy: Secondary | ICD-10-CM | POA: Diagnosis not present

## 2015-06-24 DIAGNOSIS — Z5181 Encounter for therapeutic drug level monitoring: Secondary | ICD-10-CM

## 2015-06-24 DIAGNOSIS — I2510 Atherosclerotic heart disease of native coronary artery without angina pectoris: Secondary | ICD-10-CM | POA: Diagnosis not present

## 2015-06-24 DIAGNOSIS — N183 Chronic kidney disease, stage 3 (moderate): Secondary | ICD-10-CM | POA: Diagnosis not present

## 2015-06-24 LAB — POCT INR: INR: 3

## 2015-06-25 ENCOUNTER — Ambulatory Visit (INDEPENDENT_AMBULATORY_CARE_PROVIDER_SITE_OTHER): Payer: Medicare Other | Admitting: Cardiology

## 2015-06-25 ENCOUNTER — Encounter: Payer: Self-pay | Admitting: Cardiology

## 2015-06-25 VITALS — BP 132/70 | HR 82 | Ht 67.0 in | Wt 135.4 lb

## 2015-06-25 DIAGNOSIS — I472 Ventricular tachycardia: Secondary | ICD-10-CM

## 2015-06-25 DIAGNOSIS — I482 Chronic atrial fibrillation, unspecified: Secondary | ICD-10-CM

## 2015-06-25 DIAGNOSIS — I10 Essential (primary) hypertension: Secondary | ICD-10-CM | POA: Diagnosis not present

## 2015-06-25 DIAGNOSIS — I255 Ischemic cardiomyopathy: Secondary | ICD-10-CM | POA: Diagnosis not present

## 2015-06-25 DIAGNOSIS — I059 Rheumatic mitral valve disease, unspecified: Secondary | ICD-10-CM | POA: Diagnosis not present

## 2015-06-25 DIAGNOSIS — I4729 Other ventricular tachycardia: Secondary | ICD-10-CM

## 2015-06-25 DIAGNOSIS — I5022 Chronic systolic (congestive) heart failure: Secondary | ICD-10-CM

## 2015-06-25 DIAGNOSIS — E785 Hyperlipidemia, unspecified: Secondary | ICD-10-CM

## 2015-06-25 DIAGNOSIS — I420 Dilated cardiomyopathy: Secondary | ICD-10-CM

## 2015-06-25 NOTE — Patient Instructions (Signed)
Medication Instructions:  Your physician recommends that you continue on your current medications as directed. Please refer to the Current Medication list given to you today.   Labwork: You have been given a prescription to have FASTING lab work drawn next time you have an INR.  Testing/Procedures: None  Follow-Up: Your physician wants you to follow-up in: 6 months with Dr. Radford Pax. You will receive a reminder letter in the mail two months in advance. If you Airen't receive a letter, please call our office to schedule the follow-up appointment.   Any Other Special Instructions Will Be Listed Below (If Applicable).     If you need a refill on your cardiac medications before your next appointment, please call your pharmacy.

## 2015-06-25 NOTE — Progress Notes (Signed)
Cardiology Office Note    Date:  06/25/2015   ID:  Alfred Dennis, DOB [birth year], MRN [id number]  PCP:  Alfred Casco, MD  Cardiologist:  Alfred Margarita, MD   Chief Complaint  Patient presents with  . Congestive Heart Failure  . Atrial Fibrillation  . Cardiomyopathy    History of Present Illness:  Alfred Dennis is a 82 y.o. male with a history of severe MR s/p MVR, HTN, dyslipidemia, chronic atrial fibrillation, CAD s/p CABG and ischemic dilated CM with EF 35-30% at time of his colon surgery and chronic systolic CHF. He has a history of VT but due to his age and comorbidities he was not felt to be a candidate for ICD.He is doing well today.  He was recently discharged from [facility] after Group B Strep bacteremia and is on IV antibx for 6 weeks.  He denies any chest pain. He has chronic DOE which is unchanged. This only occurs with overexertion.  He denies any palpitations, dizziness or syncope. He occasionally has some mild RLE edema which is chronic and improved since he got out of the hospital.       Past Medical History  Diagnosis Date  . Hypertension   . Shortness of breath     "w/any activity lately" (02/03/2012)  . CHF (congestive heart failure) ([employer])     mild/note 02/03/2012  . Arthritis     "left foot; fingers" (02/03/2012)  . ARF (acute renal failure) ([place])   . GIB (gastrointestinal bleeding)   . Chronic atrial fibrillation ([place])   . Non-sustained ventricular tachycardia ([place])   . Chronic anticoagulation   . Coronary artery disease 05/2000    s/p CABG with LIMA to LAD, SVG to IM, SVG to OM  . Mitral valvular regurgitation     s/p MVR with mechanical valve  . Ischemic dilated cardiomyopathy     EF 25-30% by echo 01/2012 at time of colon surgery  . Hyperlipidemia   . Stroke [facility])     after CABG  . Carcinoma of colon ([place])   . Basal cell carcinoma of shoulder 10/2010    Past Surgical History  Procedure Laterality Date  . Inguinal hernia repair        right  . Cataract extraction w/ intraocular lens  implant, bilateral  ~ 2011  . Valve replacement  05/2000    St. Jude 13mm mechanical valve  . Cardiac valve replacement  05/2000    St. Jude 29mm  . Esophagogastroduodenoscopy  02/08/2012    Procedure: ESOPHAGOGASTRODUODENOSCOPY (EGD);  Surgeon: Gatha Mayer, MD;  Location: [facility] ENDOSCOPY;  Service: Endoscopy;  Laterality: N/A;  . Colonoscopy  02/08/2012    Procedure: COLONOSCOPY;  Surgeon: Gatha Mayer, MD;  Location: [facility];  Service: Endoscopy;  Laterality: N/A;  . Laparoscopic partial colectomy  02/10/2012    Procedure: LAPAROSCOPIC PARTIAL COLECTOMY;  Surgeon: Stark Klein, MD;  Location: [place];  Service: General;  Laterality: N/A;  . Coronary artery bypass graft      LIMA to LAD, SVG to IM, SVG to OM2  . Embolectomy Left 03/21/2014    Procedure: LEFT BRACHIAL EMBOLECTOMY;  Surgeon: Elam Dutch, MD;  Location[facility];  Service: Vascular;  Laterality: Left;    Current Medications: Outpatient Prescriptions Prior to Visit  Medication Sig Dispense Refill  . acetaminophen (TYLENOL) 325 MG tablet Take 2 tablets (650 mg total) by mouth every 6 (six) hours as needed for mild pain (or Fever >/= 101).    Marland Kitchen  carvedilol (COREG) 3.125 MG tablet Take 1 tablet (3.125 mg total) by mouth 2 (two) times daily with a meal. 60 tablet 1  . cefTRIAXone 2 g in dextrose 5 % 50 mL Inject 2 g into the vein daily. Last dose on 07/16/15 35 vial 0  . furosemide (LASIX) 40 MG tablet Take 1 tablet (40 mg total) by mouth 2 (two) times daily. 30 tablet   . Multiple Vitamin (MULTIVITAMIN WITH MINERALS) TABS Take 1 tablet by mouth daily.    . polyethylene glycol (MIRALAX / GLYCOLAX) packet Take 17 g by mouth daily as needed for mild constipation. 14 each 0  . potassium chloride (K-DUR) 10 MEQ tablet Take 1 tablet (10 mEq total) by mouth daily. 30 tablet 3  . senna-docusate (SENOKOT-S) 8.6-50 MG tablet Take 2 tablets by mouth 2 (two) times daily as needed for  mild constipation.     . simvastatin (ZOCOR) 10 MG tablet Take 10 mg by mouth at bedtime.    Marland Kitchen warfarin (COUMADIN) 5 MG tablet TAKE AS DIRECTED BY COUMADIN CLINIC 30 tablet 3  . warfarin (COUMADIN) 5 MG tablet Take 2.5 mg by mouth daily. Daily     No facility-administered medications prior to visit.     Allergies:   Review of patient's allergies indicates no known allergies.   Social History   Social History  . Marital Status: Married    Spouse Name: Pamala Hurry  . Number of Children: 2  . Years of Education: N/A   Occupational History  . Retired     Cytogeneticist   Social History Main Topics  . Smoking status: Former Smoker -- 1.00 packs/day for 20 years    Types: Cigarettes    Quit date: 03/09/1980  . Smokeless tobacco: Never Used     Comment: 02/03/2012 "quit smoking in my 30's"  . Alcohol Use: No  . Drug Use: No  . Sexual Activity: No   Other Topics Concern  . None   Social History Narrative   Lives with wife, Alfred Dennis story single family home   Retired Regulatory affairs officer   Two grown children-boy & girl   Gets medications from [place] (except Coumadin)     Family History:  The patient's family history includes Cancer - Prostate in his son; Heart disease in his mother and sister.   ROS:   Please see the history of present illness.    Review of Systems  Constitution: Negative.  HENT: Negative.   Eyes: Negative.   Cardiovascular: Negative.   Respiratory: Negative.   Skin: Negative.   Musculoskeletal: Negative.   Gastrointestinal: Negative.   Genitourinary: Negative.   Neurological: Negative.   Psychiatric/Behavioral: Negative.    All other systems reviewed and are negative.   PHYSICAL EXAM:   VS:  BP 132/70 mmHg  Pulse 82  Ht 5\' 7"  (1.702 m)  Wt 135 lb 6.4 oz (61.417 kg)  BMI 21.20 kg/m2   GEN: Well nourished, well developed, in no acute distress HEENT: normal Neck: no JVD, carotid bruits, or masses Cardiac: irregularly  irregular; no murmurs, rubs, or gallops,no edema.  Intact distal pulses bilaterally. Crisp mechanical heart sounds.   Respiratory:  clear to auscultation bilaterally, normal work of breathing GI: soft, nontender, nondistended, + BS MS: no deformity or atrophy Skin: warm and dry, no rash Neuro:  Alert and Oriented x 3, Strength and sensation are intact Psych: euthymic mood, full affect  Wt Readings from Last 3 Encounters:  06/25/15  135 lb 6.4 oz (61.417 kg)  06/11/15 129 lb 6.6 oz (58.7 kg)  03/20/15 143 lb 9.6 oz (65.137 kg)      Studies/Labs Reviewed:   EKG:  EKG is not ordered today.    Recent Labs: 06/05/2015: ALT 45; B Natriuretic Peptide 1585.8* 06/09/2015: Magnesium 2.5* 06/11/2015: BUN 32*; Creatinine, Ser 1.35*; Hemoglobin 11.8*; Platelets 185; Potassium 3.7; Sodium 142   Lipid Panel    Component Value Date/Time   CHOL 110 10/23/2014 0822   TRIG 112.0 10/23/2014 0822   HDL 41.30 10/23/2014 0822   CHOLHDL 3 10/23/2014 0822   VLDL 22.4 10/23/2014 0822   LDLCALC 46 10/23/2014 [id number]    Additional studies/ records that were reviewed today include:  none    ASSESSMENT:    1. Chronic a-fib ([place])   2. Essential hypertension   3. Mitral valve disorder   4. Ischemic dilated cardiomyopathy-EF 25-30% 2013   5. Chronic systolic heart failure ([place])   6. NSVT (nonsustained ventricular tachycardia) ([facility])   7. Dyslipidemia      PLAN:  In order of problems listed above:  1. Chronic atrial fibrillation - rate controlled.  Continue BB/warfarin. Not on ASA due to history of GI bleed 2. HTN - BP well controlled.  Continue BB 3. MIVP with severe MR s/p MVR on chronic warfarin therapy 4. Ischemic DCM EF 25-30%. COntinue BB therapy.   5. Chronic systolic CHF - appears euvolemic on exam.  Continue BB/diuretic.  No  ACE I or ARB due to CKD and BP has been too soft int he past for Hydralazine. 6. NSVT felt not a candidate for ICD to due comorbidities 7. Dyslipidemia - continue  statin.  I will get a copy of his FLP and ALT. 8. ASCAD s/p remote CABG with no angina.  Continue statin and BB.  No ASA due to warfarin    Medication Adjustments/Labs and Tests Ordered: Current medicines are reviewed at length with the patient today.  Concerns regarding medicines are outlined above.  Medication changes, Labs and Tests ordered today are listed in the Patient Instructions below. There are no Patient Instructions on file for this visit.   Lurena Nida, MD  06/25/2015 2:25 PM    [facility] [address], [place], [place]  [address] Phone: [phone]; Fax: [phone]

## 2015-06-26 DIAGNOSIS — I129 Hypertensive chronic kidney disease with stage 1 through stage 4 chronic kidney disease, or unspecified chronic kidney disease: Secondary | ICD-10-CM | POA: Diagnosis not present

## 2015-06-26 DIAGNOSIS — A419 Sepsis, unspecified organism: Secondary | ICD-10-CM | POA: Diagnosis not present

## 2015-06-26 DIAGNOSIS — I119 Hypertensive heart disease without heart failure: Secondary | ICD-10-CM | POA: Diagnosis not present

## 2015-06-26 DIAGNOSIS — I509 Heart failure, unspecified: Secondary | ICD-10-CM | POA: Diagnosis not present

## 2015-06-26 DIAGNOSIS — L0390 Cellulitis, unspecified: Secondary | ICD-10-CM | POA: Diagnosis not present

## 2015-06-26 DIAGNOSIS — N183 Chronic kidney disease, stage 3 (moderate): Secondary | ICD-10-CM | POA: Diagnosis not present

## 2015-06-27 DIAGNOSIS — I5021 Acute systolic (congestive) heart failure: Secondary | ICD-10-CM | POA: Diagnosis not present

## 2015-06-27 DIAGNOSIS — L97519 Non-pressure chronic ulcer of other part of right foot with unspecified severity: Secondary | ICD-10-CM | POA: Diagnosis not present

## 2015-06-27 DIAGNOSIS — I2510 Atherosclerotic heart disease of native coronary artery without angina pectoris: Secondary | ICD-10-CM | POA: Diagnosis not present

## 2015-06-27 DIAGNOSIS — L03115 Cellulitis of right lower limb: Secondary | ICD-10-CM | POA: Diagnosis not present

## 2015-06-27 DIAGNOSIS — N183 Chronic kidney disease, stage 3 (moderate): Secondary | ICD-10-CM | POA: Diagnosis not present

## 2015-06-27 DIAGNOSIS — I255 Ischemic cardiomyopathy: Secondary | ICD-10-CM | POA: Diagnosis not present

## 2015-06-27 DIAGNOSIS — I129 Hypertensive chronic kidney disease with stage 1 through stage 4 chronic kidney disease, or unspecified chronic kidney disease: Secondary | ICD-10-CM | POA: Diagnosis not present

## 2015-06-28 DIAGNOSIS — I255 Ischemic cardiomyopathy: Secondary | ICD-10-CM | POA: Diagnosis not present

## 2015-06-28 DIAGNOSIS — N183 Chronic kidney disease, stage 3 (moderate): Secondary | ICD-10-CM | POA: Diagnosis not present

## 2015-06-28 DIAGNOSIS — I129 Hypertensive chronic kidney disease with stage 1 through stage 4 chronic kidney disease, or unspecified chronic kidney disease: Secondary | ICD-10-CM | POA: Diagnosis not present

## 2015-06-28 DIAGNOSIS — E785 Hyperlipidemia, unspecified: Secondary | ICD-10-CM | POA: Diagnosis not present

## 2015-06-28 DIAGNOSIS — I5021 Acute systolic (congestive) heart failure: Secondary | ICD-10-CM | POA: Diagnosis not present

## 2015-06-28 DIAGNOSIS — I2510 Atherosclerotic heart disease of native coronary artery without angina pectoris: Secondary | ICD-10-CM | POA: Diagnosis not present

## 2015-06-28 DIAGNOSIS — L03115 Cellulitis of right lower limb: Secondary | ICD-10-CM | POA: Diagnosis not present

## 2015-06-29 DIAGNOSIS — I255 Ischemic cardiomyopathy: Secondary | ICD-10-CM | POA: Diagnosis not present

## 2015-06-29 DIAGNOSIS — N183 Chronic kidney disease, stage 3 (moderate): Secondary | ICD-10-CM | POA: Diagnosis not present

## 2015-06-29 DIAGNOSIS — I2510 Atherosclerotic heart disease of native coronary artery without angina pectoris: Secondary | ICD-10-CM | POA: Diagnosis not present

## 2015-06-29 DIAGNOSIS — I5021 Acute systolic (congestive) heart failure: Secondary | ICD-10-CM | POA: Diagnosis not present

## 2015-06-29 DIAGNOSIS — I129 Hypertensive chronic kidney disease with stage 1 through stage 4 chronic kidney disease, or unspecified chronic kidney disease: Secondary | ICD-10-CM | POA: Diagnosis not present

## 2015-06-29 DIAGNOSIS — L03115 Cellulitis of right lower limb: Secondary | ICD-10-CM | POA: Diagnosis not present

## 2015-06-30 DIAGNOSIS — I255 Ischemic cardiomyopathy: Secondary | ICD-10-CM | POA: Diagnosis not present

## 2015-06-30 DIAGNOSIS — I129 Hypertensive chronic kidney disease with stage 1 through stage 4 chronic kidney disease, or unspecified chronic kidney disease: Secondary | ICD-10-CM | POA: Diagnosis not present

## 2015-06-30 DIAGNOSIS — N183 Chronic kidney disease, stage 3 (moderate): Secondary | ICD-10-CM | POA: Diagnosis not present

## 2015-06-30 DIAGNOSIS — L03115 Cellulitis of right lower limb: Secondary | ICD-10-CM | POA: Diagnosis not present

## 2015-06-30 DIAGNOSIS — I5021 Acute systolic (congestive) heart failure: Secondary | ICD-10-CM | POA: Diagnosis not present

## 2015-06-30 DIAGNOSIS — I2510 Atherosclerotic heart disease of native coronary artery without angina pectoris: Secondary | ICD-10-CM | POA: Diagnosis not present

## 2015-07-01 DIAGNOSIS — I5021 Acute systolic (congestive) heart failure: Secondary | ICD-10-CM | POA: Diagnosis not present

## 2015-07-01 DIAGNOSIS — I255 Ischemic cardiomyopathy: Secondary | ICD-10-CM | POA: Diagnosis not present

## 2015-07-01 DIAGNOSIS — I129 Hypertensive chronic kidney disease with stage 1 through stage 4 chronic kidney disease, or unspecified chronic kidney disease: Secondary | ICD-10-CM | POA: Diagnosis not present

## 2015-07-01 DIAGNOSIS — N183 Chronic kidney disease, stage 3 (moderate): Secondary | ICD-10-CM | POA: Diagnosis not present

## 2015-07-01 DIAGNOSIS — Z8249 Family history of ischemic heart disease and other diseases of the circulatory system: Secondary | ICD-10-CM | POA: Diagnosis not present

## 2015-07-01 DIAGNOSIS — L03115 Cellulitis of right lower limb: Secondary | ICD-10-CM | POA: Diagnosis not present

## 2015-07-01 DIAGNOSIS — I2510 Atherosclerotic heart disease of native coronary artery without angina pectoris: Secondary | ICD-10-CM | POA: Diagnosis not present

## 2015-07-03 DIAGNOSIS — N183 Chronic kidney disease, stage 3 (moderate): Secondary | ICD-10-CM | POA: Diagnosis not present

## 2015-07-03 DIAGNOSIS — I2510 Atherosclerotic heart disease of native coronary artery without angina pectoris: Secondary | ICD-10-CM | POA: Diagnosis not present

## 2015-07-03 DIAGNOSIS — I5021 Acute systolic (congestive) heart failure: Secondary | ICD-10-CM | POA: Diagnosis not present

## 2015-07-03 DIAGNOSIS — I129 Hypertensive chronic kidney disease with stage 1 through stage 4 chronic kidney disease, or unspecified chronic kidney disease: Secondary | ICD-10-CM | POA: Diagnosis not present

## 2015-07-03 DIAGNOSIS — L03115 Cellulitis of right lower limb: Secondary | ICD-10-CM | POA: Diagnosis not present

## 2015-07-03 DIAGNOSIS — I255 Ischemic cardiomyopathy: Secondary | ICD-10-CM | POA: Diagnosis not present

## 2015-07-04 ENCOUNTER — Encounter: Payer: Self-pay | Admitting: Cardiology

## 2015-07-08 ENCOUNTER — Ambulatory Visit (INDEPENDENT_AMBULATORY_CARE_PROVIDER_SITE_OTHER): Payer: Medicare Other | Admitting: Internal Medicine

## 2015-07-08 DIAGNOSIS — I129 Hypertensive chronic kidney disease with stage 1 through stage 4 chronic kidney disease, or unspecified chronic kidney disease: Secondary | ICD-10-CM | POA: Diagnosis not present

## 2015-07-08 DIAGNOSIS — N183 Chronic kidney disease, stage 3 (moderate): Secondary | ICD-10-CM | POA: Diagnosis not present

## 2015-07-08 DIAGNOSIS — I059 Rheumatic mitral valve disease, unspecified: Secondary | ICD-10-CM

## 2015-07-08 DIAGNOSIS — I5021 Acute systolic (congestive) heart failure: Secondary | ICD-10-CM | POA: Diagnosis not present

## 2015-07-08 DIAGNOSIS — Z5181 Encounter for therapeutic drug level monitoring: Secondary | ICD-10-CM

## 2015-07-08 DIAGNOSIS — L03115 Cellulitis of right lower limb: Secondary | ICD-10-CM | POA: Diagnosis not present

## 2015-07-08 DIAGNOSIS — I2510 Atherosclerotic heart disease of native coronary artery without angina pectoris: Secondary | ICD-10-CM | POA: Diagnosis not present

## 2015-07-08 DIAGNOSIS — I255 Ischemic cardiomyopathy: Secondary | ICD-10-CM | POA: Diagnosis not present

## 2015-07-08 LAB — POCT INR: INR: 2.6

## 2015-07-10 DIAGNOSIS — I255 Ischemic cardiomyopathy: Secondary | ICD-10-CM | POA: Diagnosis not present

## 2015-07-10 DIAGNOSIS — I129 Hypertensive chronic kidney disease with stage 1 through stage 4 chronic kidney disease, or unspecified chronic kidney disease: Secondary | ICD-10-CM | POA: Diagnosis not present

## 2015-07-10 DIAGNOSIS — L03115 Cellulitis of right lower limb: Secondary | ICD-10-CM | POA: Diagnosis not present

## 2015-07-10 DIAGNOSIS — N183 Chronic kidney disease, stage 3 (moderate): Secondary | ICD-10-CM | POA: Diagnosis not present

## 2015-07-10 DIAGNOSIS — I5021 Acute systolic (congestive) heart failure: Secondary | ICD-10-CM | POA: Diagnosis not present

## 2015-07-10 DIAGNOSIS — I2510 Atherosclerotic heart disease of native coronary artery without angina pectoris: Secondary | ICD-10-CM | POA: Diagnosis not present

## 2015-07-11 DIAGNOSIS — I2510 Atherosclerotic heart disease of native coronary artery without angina pectoris: Secondary | ICD-10-CM | POA: Diagnosis not present

## 2015-07-11 DIAGNOSIS — N183 Chronic kidney disease, stage 3 (moderate): Secondary | ICD-10-CM | POA: Diagnosis not present

## 2015-07-11 DIAGNOSIS — I255 Ischemic cardiomyopathy: Secondary | ICD-10-CM | POA: Diagnosis not present

## 2015-07-11 DIAGNOSIS — I129 Hypertensive chronic kidney disease with stage 1 through stage 4 chronic kidney disease, or unspecified chronic kidney disease: Secondary | ICD-10-CM | POA: Diagnosis not present

## 2015-07-11 DIAGNOSIS — I5021 Acute systolic (congestive) heart failure: Secondary | ICD-10-CM | POA: Diagnosis not present

## 2015-07-11 DIAGNOSIS — L03115 Cellulitis of right lower limb: Secondary | ICD-10-CM | POA: Diagnosis not present

## 2015-07-15 ENCOUNTER — Other Ambulatory Visit: Payer: Self-pay | Admitting: Cardiology

## 2015-07-15 DIAGNOSIS — I5021 Acute systolic (congestive) heart failure: Secondary | ICD-10-CM | POA: Diagnosis not present

## 2015-07-15 DIAGNOSIS — I255 Ischemic cardiomyopathy: Secondary | ICD-10-CM | POA: Diagnosis not present

## 2015-07-15 DIAGNOSIS — I129 Hypertensive chronic kidney disease with stage 1 through stage 4 chronic kidney disease, or unspecified chronic kidney disease: Secondary | ICD-10-CM | POA: Diagnosis not present

## 2015-07-15 DIAGNOSIS — N183 Chronic kidney disease, stage 3 (moderate): Secondary | ICD-10-CM | POA: Diagnosis not present

## 2015-07-15 DIAGNOSIS — I2510 Atherosclerotic heart disease of native coronary artery without angina pectoris: Secondary | ICD-10-CM | POA: Diagnosis not present

## 2015-07-15 DIAGNOSIS — L03115 Cellulitis of right lower limb: Secondary | ICD-10-CM | POA: Diagnosis not present

## 2015-07-16 DIAGNOSIS — I129 Hypertensive chronic kidney disease with stage 1 through stage 4 chronic kidney disease, or unspecified chronic kidney disease: Secondary | ICD-10-CM | POA: Diagnosis not present

## 2015-07-16 DIAGNOSIS — I2510 Atherosclerotic heart disease of native coronary artery without angina pectoris: Secondary | ICD-10-CM | POA: Diagnosis not present

## 2015-07-16 DIAGNOSIS — N183 Chronic kidney disease, stage 3 (moderate): Secondary | ICD-10-CM | POA: Diagnosis not present

## 2015-07-16 DIAGNOSIS — I5021 Acute systolic (congestive) heart failure: Secondary | ICD-10-CM | POA: Diagnosis not present

## 2015-07-16 DIAGNOSIS — L03115 Cellulitis of right lower limb: Secondary | ICD-10-CM | POA: Diagnosis not present

## 2015-07-16 DIAGNOSIS — I255 Ischemic cardiomyopathy: Secondary | ICD-10-CM | POA: Diagnosis not present

## 2015-07-17 DIAGNOSIS — I2510 Atherosclerotic heart disease of native coronary artery without angina pectoris: Secondary | ICD-10-CM | POA: Diagnosis not present

## 2015-07-17 DIAGNOSIS — I255 Ischemic cardiomyopathy: Secondary | ICD-10-CM | POA: Diagnosis not present

## 2015-07-17 DIAGNOSIS — N183 Chronic kidney disease, stage 3 (moderate): Secondary | ICD-10-CM | POA: Diagnosis not present

## 2015-07-17 DIAGNOSIS — L03115 Cellulitis of right lower limb: Secondary | ICD-10-CM | POA: Diagnosis not present

## 2015-07-17 DIAGNOSIS — I129 Hypertensive chronic kidney disease with stage 1 through stage 4 chronic kidney disease, or unspecified chronic kidney disease: Secondary | ICD-10-CM | POA: Diagnosis not present

## 2015-07-17 DIAGNOSIS — I5021 Acute systolic (congestive) heart failure: Secondary | ICD-10-CM | POA: Diagnosis not present

## 2015-07-18 DIAGNOSIS — L97511 Non-pressure chronic ulcer of other part of right foot limited to breakdown of skin: Secondary | ICD-10-CM | POA: Diagnosis not present

## 2015-07-25 DIAGNOSIS — I129 Hypertensive chronic kidney disease with stage 1 through stage 4 chronic kidney disease, or unspecified chronic kidney disease: Secondary | ICD-10-CM | POA: Diagnosis not present

## 2015-07-25 DIAGNOSIS — I5021 Acute systolic (congestive) heart failure: Secondary | ICD-10-CM | POA: Diagnosis not present

## 2015-07-25 DIAGNOSIS — N183 Chronic kidney disease, stage 3 (moderate): Secondary | ICD-10-CM | POA: Diagnosis not present

## 2015-07-25 DIAGNOSIS — I255 Ischemic cardiomyopathy: Secondary | ICD-10-CM | POA: Diagnosis not present

## 2015-07-25 DIAGNOSIS — L03115 Cellulitis of right lower limb: Secondary | ICD-10-CM | POA: Diagnosis not present

## 2015-07-25 DIAGNOSIS — I2510 Atherosclerotic heart disease of native coronary artery without angina pectoris: Secondary | ICD-10-CM | POA: Diagnosis not present

## 2015-07-28 ENCOUNTER — Encounter (HOSPITAL_COMMUNITY): Payer: Self-pay | Admitting: Emergency Medicine

## 2015-07-28 ENCOUNTER — Emergency Department (HOSPITAL_COMMUNITY): Payer: Medicare Other

## 2015-07-28 ENCOUNTER — Inpatient Hospital Stay (HOSPITAL_COMMUNITY)
Admission: EM | Admit: 2015-07-28 | Discharge: 2015-08-01 | DRG: 372 | Disposition: A | Discharge status: Home Health | Payer: Medicare Other | Attending: Internal Medicine | Admitting: Internal Medicine

## 2015-07-28 DIAGNOSIS — N179 Acute kidney failure, unspecified: Secondary | ICD-10-CM | POA: Diagnosis not present

## 2015-07-28 DIAGNOSIS — Z8249 Family history of ischemic heart disease and other diseases of the circulatory system: Secondary | ICD-10-CM | POA: Diagnosis not present

## 2015-07-28 DIAGNOSIS — Z85828 Personal history of other malignant neoplasm of skin: Secondary | ICD-10-CM | POA: Diagnosis not present

## 2015-07-28 DIAGNOSIS — A047 Enterocolitis due to Clostridium difficile: Principal | ICD-10-CM | POA: Diagnosis present

## 2015-07-28 DIAGNOSIS — Z951 Presence of aortocoronary bypass graft: Secondary | ICD-10-CM | POA: Diagnosis not present

## 2015-07-28 DIAGNOSIS — E860 Dehydration: Secondary | ICD-10-CM | POA: Diagnosis not present

## 2015-07-28 DIAGNOSIS — I482 Chronic atrial fibrillation: Secondary | ICD-10-CM | POA: Diagnosis present

## 2015-07-28 DIAGNOSIS — R197 Diarrhea, unspecified: Secondary | ICD-10-CM | POA: Diagnosis not present

## 2015-07-28 DIAGNOSIS — Z952 Presence of prosthetic heart valve: Secondary | ICD-10-CM

## 2015-07-28 DIAGNOSIS — Z7901 Long term (current) use of anticoagulants: Secondary | ICD-10-CM

## 2015-07-28 DIAGNOSIS — Z79899 Other long term (current) drug therapy: Secondary | ICD-10-CM | POA: Diagnosis not present

## 2015-07-28 DIAGNOSIS — R748 Abnormal levels of other serum enzymes: Secondary | ICD-10-CM | POA: Diagnosis present

## 2015-07-28 DIAGNOSIS — I130 Hypertensive heart and chronic kidney disease with heart failure and stage 1 through stage 4 chronic kidney disease, or unspecified chronic kidney disease: Secondary | ICD-10-CM | POA: Diagnosis not present

## 2015-07-28 DIAGNOSIS — I4891 Unspecified atrial fibrillation: Secondary | ICD-10-CM

## 2015-07-28 DIAGNOSIS — E785 Hyperlipidemia, unspecified: Secondary | ICD-10-CM | POA: Diagnosis present

## 2015-07-28 DIAGNOSIS — I5022 Chronic systolic (congestive) heart failure: Secondary | ICD-10-CM | POA: Diagnosis not present

## 2015-07-28 DIAGNOSIS — I2510 Atherosclerotic heart disease of native coronary artery without angina pectoris: Secondary | ICD-10-CM | POA: Diagnosis present

## 2015-07-28 DIAGNOSIS — Z85038 Personal history of other malignant neoplasm of large intestine: Secondary | ICD-10-CM

## 2015-07-28 DIAGNOSIS — N183 Chronic kidney disease, stage 3 unspecified: Secondary | ICD-10-CM | POA: Diagnosis present

## 2015-07-28 DIAGNOSIS — Z8673 Personal history of transient ischemic attack (TIA), and cerebral infarction without residual deficits: Secondary | ICD-10-CM

## 2015-07-28 DIAGNOSIS — D696 Thrombocytopenia, unspecified: Secondary | ICD-10-CM | POA: Diagnosis present

## 2015-07-28 DIAGNOSIS — Z87891 Personal history of nicotine dependence: Secondary | ICD-10-CM | POA: Diagnosis not present

## 2015-07-28 DIAGNOSIS — K4090 Unilateral inguinal hernia, without obstruction or gangrene, not specified as recurrent: Secondary | ICD-10-CM | POA: Diagnosis present

## 2015-07-28 DIAGNOSIS — I10 Essential (primary) hypertension: Secondary | ICD-10-CM | POA: Diagnosis present

## 2015-07-28 DIAGNOSIS — I447 Left bundle-branch block, unspecified: Secondary | ICD-10-CM | POA: Diagnosis present

## 2015-07-28 DIAGNOSIS — I480 Paroxysmal atrial fibrillation: Secondary | ICD-10-CM | POA: Diagnosis not present

## 2015-07-28 DIAGNOSIS — R531 Weakness: Secondary | ICD-10-CM | POA: Diagnosis not present

## 2015-07-28 DIAGNOSIS — D649 Anemia, unspecified: Secondary | ICD-10-CM | POA: Diagnosis present

## 2015-07-28 DIAGNOSIS — K922 Gastrointestinal hemorrhage, unspecified: Secondary | ICD-10-CM | POA: Diagnosis present

## 2015-07-28 DIAGNOSIS — I255 Ischemic cardiomyopathy: Secondary | ICD-10-CM | POA: Diagnosis present

## 2015-07-28 DIAGNOSIS — Z91048 Other nonmedicinal substance allergy status: Secondary | ICD-10-CM | POA: Diagnosis not present

## 2015-07-28 DIAGNOSIS — I5042 Chronic combined systolic (congestive) and diastolic (congestive) heart failure: Secondary | ICD-10-CM | POA: Diagnosis present

## 2015-07-28 DIAGNOSIS — R404 Transient alteration of awareness: Secondary | ICD-10-CM | POA: Diagnosis not present

## 2015-07-28 DIAGNOSIS — A0472 Enterocolitis due to Clostridium difficile, not specified as recurrent: Secondary | ICD-10-CM | POA: Diagnosis present

## 2015-07-28 LAB — PROTIME-INR
INR: 2.76 — ABNORMAL HIGH (ref 0.00–1.49)
PROTHROMBIN TIME: 28.8 s — AB (ref 11.6–15.2)

## 2015-07-28 LAB — COMPREHENSIVE METABOLIC PANEL
ALT: 29 U/L (ref 17–63)
AST: 40 U/L (ref 15–41)
Albumin: 3.7 g/dL (ref 3.5–5.0)
Alkaline Phosphatase: 47 U/L (ref 38–126)
Anion gap: 13 (ref 5–15)
BUN: 25 mg/dL — ABNORMAL HIGH (ref 6–20)
CHLORIDE: 103 mmol/L (ref 101–111)
CO2: 23 mmol/L (ref 22–32)
Calcium: 9.1 mg/dL (ref 8.9–10.3)
Creatinine, Ser: 1.9 mg/dL — ABNORMAL HIGH (ref 0.61–1.24)
GFR, EST AFRICAN AMERICAN: 34 mL/min — AB (ref >60)
GFR, EST NON AFRICAN AMERICAN: 30 mL/min — AB (ref >60)
Glucose, Bld: 123 mg/dL — ABNORMAL HIGH (ref 65–99)
POTASSIUM: 3.7 mmol/L (ref 3.5–5.1)
Sodium: 139 mmol/L (ref 135–145)
Total Bilirubin: 1.9 mg/dL — ABNORMAL HIGH (ref 0.3–1.2)
Total Protein: 6.5 g/dL (ref 6.5–8.1)

## 2015-07-28 LAB — URINALYSIS, ROUTINE W REFLEX MICROSCOPIC
Bilirubin Urine: NEGATIVE
Glucose, UA: NEGATIVE mg/dL
Ketones, ur: NEGATIVE mg/dL
LEUKOCYTES UA: NEGATIVE
Nitrite: NEGATIVE
PROTEIN: 30 mg/dL — AB
Specific Gravity, Urine: 1.02 (ref 1.005–1.030)
pH: 5 (ref 5.0–8.0)

## 2015-07-28 LAB — CBC WITH DIFFERENTIAL/PLATELET
BASOS ABS: 0 10*3/uL (ref 0.0–0.1)
Basophils Relative: 0 %
EOS ABS: 0 10*3/uL (ref 0.0–0.7)
EOS PCT: 0 %
HCT: 38.8 % — ABNORMAL LOW (ref 39.0–52.0)
Hemoglobin: 12.4 g/dL — ABNORMAL LOW (ref 13.0–17.0)
LYMPHS PCT: 4 %
Lymphs Abs: 0.5 10*3/uL — ABNORMAL LOW (ref 0.7–4.0)
MCH: 29.3 pg (ref 26.0–34.0)
MCHC: 32 g/dL (ref 30.0–36.0)
MCV: 91.7 fL (ref 78.0–100.0)
MONO ABS: 0.9 10*3/uL (ref 0.1–1.0)
Monocytes Relative: 8 %
Neutro Abs: 9.9 10*3/uL — ABNORMAL HIGH (ref 1.7–7.7)
Neutrophils Relative %: 87 %
PLATELETS: 100 10*3/uL — AB (ref 150–400)
RBC: 4.23 MIL/uL (ref 4.22–5.81)
RDW: 14 % (ref 11.5–15.5)
WBC: 11.4 10*3/uL — ABNORMAL HIGH (ref 4.0–10.5)

## 2015-07-28 LAB — URINE MICROSCOPIC-ADD ON

## 2015-07-28 LAB — C DIFFICILE QUICK SCREEN W PCR REFLEX
C DIFFICILE (CDIFF) INTERP: POSITIVE
C DIFFICILE (CDIFF) TOXIN: POSITIVE — AB
C DIFFICLE (CDIFF) ANTIGEN: POSITIVE — AB

## 2015-07-28 LAB — TROPONIN I: TROPONIN I: 0.08 ng/mL — AB (ref <0.031)

## 2015-07-28 MED ORDER — MORPHINE SULFATE (PF) 2 MG/ML IV SOLN: 1.0000 mg | INTRAVENOUS | Status: DC | PRN

## 2015-07-28 MED ORDER — TRAZODONE HCL 50 MG PO TABS
25.0000 mg | ORAL_TABLET | Freq: Every evening | ORAL | Status: DC | PRN
  Administered 2015-07-29: 25 mg via ORAL
  Filled 2015-07-28: qty 1

## 2015-07-28 MED ORDER — VANCOMYCIN 50 MG/ML ORAL SOLUTION
125.0000 mg | Freq: Four times a day (QID) | ORAL | Status: DC
  Administered 2015-07-28 – 2015-08-01 (×17): 125 mg via ORAL
  Filled 2015-07-28 (×20): qty 2.5

## 2015-07-28 MED ORDER — FUROSEMIDE 20 MG PO TABS
20.0000 mg | ORAL_TABLET | Freq: Two times a day (BID) | ORAL | Status: DC
  Administered 2015-07-28 – 2015-07-29 (×2): 20 mg via ORAL
  Filled 2015-07-28 (×2): qty 1

## 2015-07-28 MED ORDER — CARVEDILOL 3.125 MG PO TABS
3.1250 mg | ORAL_TABLET | Freq: Two times a day (BID) | ORAL | Status: DC
  Administered 2015-07-28 – 2015-08-01 (×8): 3.125 mg via ORAL
  Filled 2015-07-28 (×8): qty 1

## 2015-07-28 MED ORDER — MAGNESIUM CITRATE PO SOLN
1.0000 | Freq: Once | ORAL | Status: DC | PRN
Start: 2015-07-28 — End: 2015-08-01

## 2015-07-28 MED ORDER — ACETAMINOPHEN 325 MG PO TABS: 650.0000 mg | ORAL_TABLET | Freq: Four times a day (QID) | ORAL | Status: DC | PRN

## 2015-07-28 MED ORDER — ONDANSETRON HCL 4 MG PO TABS
4.0000 mg | ORAL_TABLET | Freq: Four times a day (QID) | ORAL | Status: DC | PRN
Start: 2015-07-28 — End: 2015-08-01

## 2015-07-28 MED ORDER — WARFARIN SODIUM 2.5 MG PO TABS
2.5000 mg | ORAL_TABLET | Freq: Every day | ORAL | Status: DC
  Administered 2015-07-28: 2.5 mg via ORAL
  Filled 2015-07-28: qty 1

## 2015-07-28 MED ORDER — HYDROCODONE-ACETAMINOPHEN 5-325 MG PO TABS: 1.0000 | ORAL_TABLET | ORAL | Status: DC | PRN

## 2015-07-28 MED ORDER — WARFARIN SODIUM 2.5 MG PO TABS: 2.5000 mg | ORAL_TABLET | Freq: Every day | ORAL | Status: DC

## 2015-07-28 MED ORDER — SODIUM CHLORIDE 0.9 % IV SOLN
INTRAVENOUS | Status: DC
  Administered 2015-07-28 – 2015-07-29 (×2): via INTRAVENOUS

## 2015-07-28 MED ORDER — WARFARIN SODIUM 5 MG PO TABS: 5.0000 mg | ORAL_TABLET | Freq: Every day | ORAL | Status: DC

## 2015-07-28 MED ORDER — METRONIDAZOLE 500 MG PO TABS: 500.0000 mg | ORAL_TABLET | Freq: Three times a day (TID) | ORAL | Status: DC

## 2015-07-28 MED ORDER — BISACODYL 10 MG RE SUPP: 10.0000 mg | Freq: Every day | RECTAL | Status: DC | PRN

## 2015-07-28 MED ORDER — ACETAMINOPHEN 650 MG RE SUPP: 650.0000 mg | Freq: Four times a day (QID) | RECTAL | Status: DC | PRN

## 2015-07-28 MED ORDER — SIMVASTATIN 10 MG PO TABS
10.0000 mg | ORAL_TABLET | Freq: Every day | ORAL | Status: DC
  Administered 2015-07-28 – 2015-07-31 (×4): 10 mg via ORAL
  Filled 2015-07-28 (×4): qty 1

## 2015-07-28 MED ORDER — WARFARIN - PHARMACIST DOSING INPATIENT
Freq: Every day | Status: DC
  Administered 2015-07-28: 18:00:00

## 2015-07-28 MED ORDER — ONDANSETRON HCL 4 MG/2ML IJ SOLN: 4.0000 mg | Freq: Four times a day (QID) | INTRAMUSCULAR | Status: DC | PRN

## 2015-07-28 MED ORDER — POTASSIUM CHLORIDE CRYS ER 10 MEQ PO TBCR
10.0000 meq | EXTENDED_RELEASE_TABLET | Freq: Every day | ORAL | Status: DC
  Administered 2015-07-28 – 2015-08-01 (×5): 10 meq via ORAL
  Filled 2015-07-28 (×7): qty 1

## 2015-07-28 MED ORDER — SENNOSIDES-DOCUSATE SODIUM 8.6-50 MG PO TABS
1.0000 | ORAL_TABLET | Freq: Every evening | ORAL | Status: DC | PRN
Start: 2015-07-28 — End: 2015-08-01
  Filled 2015-07-28: qty 1

## 2015-07-28 MED ORDER — SODIUM CHLORIDE 0.9% FLUSH
3.0000 mL | Freq: Two times a day (BID) | INTRAVENOUS | Status: DC
  Administered 2015-07-28 – 2015-07-31 (×4): 3 mL via INTRAVENOUS

## 2015-07-28 NOTE — Progress Notes (Signed)
ANTICOAGULATION CONSULT NOTE - Initial Consult  Pharmacy Consult for warfarin Indication: atrial fibrillation and hx mech valve  No Known Allergies  Patient Measurements: Height: 5\' 7"  (170.2 cm) Weight: 131 lb (59.421 kg) IBW/kg (Calculated) : 66.1  Vital Signs: Temp: 98.5 F (36.9 C) (05/21 0531) Temp Source: Oral (05/21 0531) BP: 129/60 mmHg (05/21 0915) Pulse Rate: 105 (05/21 0610)  Labs:  Recent Labs  07/28/15 0619  HGB 12.4*  HCT 38.8*  PLT 100*  LABPROT 28.8*  INR 2.76*  CREATININE 1.90*  TROPONINI 0.08*    Estimated Creatinine Clearance: 22.1 mL/min (by C-G formula based on Cr of 1.9).   Medical History: Past Medical History  Diagnosis Date  . Hypertension   . Shortness of breath     "w/any activity lately" (02/03/2012)  . CHF (congestive heart failure) ([place])     mild/note 02/03/2012  . Arthritis     "left foot; fingers" (02/03/2012)  . ARF (acute renal failure) ([place])   . GIB (gastrointestinal bleeding)   . Chronic atrial fibrillation ([place])   . Non-sustained ventricular tachycardia ([place])   . Chronic anticoagulation   . Coronary artery disease 05/2000    s/p CABG with LIMA to LAD, SVG to IM, SVG to OM  . Mitral valvular regurgitation     s/p MVR with mechanical valve  . Ischemic dilated cardiomyopathy     EF 25-30% by echo 01/2012 at time of colon surgery  . Hyperlipidemia   . Stroke [facility])     after CABG  . Carcinoma of colon ([place])   . Basal cell carcinoma of shoulder 10/2010   Assessment: 53 yof presented to the hospital with diarrhea and found to have CDiff. Recently completed a prolonged course of IV antibiotics. He is chronically anticoagulated with warfarin for history of afib and a mechanical valve. INR is therapeutic today and no bleeding noted.   Of note, initially ordered metronidazole for Cdiff but vancomycin PO is the preferred first line agent. Also metronidazole interacts with coumadin making it challenging to manage. Per discussion  with PA, will change to PO vancomycin.   Goal of Therapy:  INR 2.5-3.5 Monitor platelets by anticoagulation protocol: Yes   Plan:  - Warfarin 2.5mg  PO daily - Daily INR - DC flagyl and start vancomycin 125mg  PO QID x 14 days - Monitor clinical response to PO vancomycin  Edyth Glomb, Rande Lawman 07/28/2015,10:37 AM

## 2015-07-28 NOTE — ED Notes (Signed)
Attempted report x1. 

## 2015-07-28 NOTE — ED Notes (Signed)
Admitting team at the bedside.

## 2015-07-28 NOTE — H&P (Signed)
History and Physical    Alfred Dennis [id number] DOB: 08/10/1923 DOA: 07/28/2015   PCP: Osborne Casco, MD   Patient coming from:  Home  Chief Complaint: Diarrhea  HPI: Alfred Dennis is a 82 y.o. male with medical history significant for CAF, CHF, CAD, HLD, prior CVA, and recent hospitalization for sepsis due to group B Streptococcus bacteremia and cellulitis (3/29-06/11/15) discharged on IV ceftriaxone for 6 weeks, last dose on 07/26/2015, presenting today with 1 day history of severe watery diarrhea with associated fatigue. Symptoms began last afternoon. He reported the knee showed nausea with the symptoms, but no vomiting. He denies any blood in the stools. She denies any food poisoning, new foods, or sick contacts. He denies abdominal pain, only cramping when he has bowel movements, total of 4 overnight. Appetite is normal. He denies any fever, chills or night sweats. No chest pain or shortness of breath. He denies any dizziness or vertigo. He denies any rashes or cuts. He denies any myalgias. Denies any dysuria. Denies abnormal skin rashes, or neuropathy. Denies any bleeding issues such as epistaxis, hematemesis, hematuria or hematochezia. No confusion is reported.    ED Course:  BP 129/60 mmHg  Pulse 105  Temp(Src) 98.5 F (36.9 C) (Oral)  Resp 21  Ht 5\' 7"  (1.702 m)  Wt 59.421 kg (131 lb)  BMI 20.51 kg/m2  SpO2 93% Initially was tachycardic but this improved with IV fluid 1 l bolus. White count 11.4. Hemoglobin 12.4. Platelets 100. Creatinine 1.9. Troponin 0.08. INR 2.76. Glucose 123.  Abdominal x-ray shows non-obstructive bowel gas pattern. His cardiomegaly is stable moderate without overt pulmonary edema. UA with negative nitrite. C diff is positive  EKG with Atrial fibrillation Paired ventricular premature complexes LBB. No significant change since last tracing QTC 442    Review of Systems: As per HPI otherwise 10 point review of systems negative.   Past  Medical History  Diagnosis Date  . Hypertension   . Shortness of breath     "w/any activity lately" (02/03/2012)  . CHF (congestive heart failure) ([place])     mild/note 02/03/2012  . Arthritis     "left foot; fingers" (02/03/2012)  . ARF (acute renal failure) ([place])   . GIB (gastrointestinal bleeding)   . Chronic atrial fibrillation ([place])   . Non-sustained ventricular tachycardia ([place])   . Chronic anticoagulation   . Coronary artery disease 05/2000    s/p CABG with LIMA to LAD, SVG to IM, SVG to OM  . Mitral valvular regurgitation     s/p MVR with mechanical valve  . Ischemic dilated cardiomyopathy     EF 25-30% by echo 01/2012 at time of colon surgery  . Hyperlipidemia   . Stroke [facility])     after CABG  . Carcinoma of colon ([place])   . Basal cell carcinoma of shoulder 10/2010    Past Surgical History  Procedure Laterality Date  . Inguinal hernia repair      right  . Cataract extraction w/ intraocular lens  implant, bilateral  ~ 2011  . Valve replacement  05/2000    St. Jude 37mm mechanical valve  . Cardiac valve replacement  05/2000    St. Jude 57mm  . Esophagogastroduodenoscopy  02/08/2012    Procedure: ESOPHAGOGASTRODUODENOSCOPY (EGD);  Surgeon: Gatha Mayer, MD;  Location: [facility] ENDOSCOPY;  Service: Endoscopy;  Laterality: N/A;  . Colonoscopy  02/08/2012    Procedure: COLONOSCOPY;  Surgeon: Gatha Mayer, MD;  Location: [place];  Service: Endoscopy;  Laterality: N/A;  . Laparoscopic partial colectomy  02/10/2012    Procedure: LAPAROSCOPIC PARTIAL COLECTOMY;  Surgeon: Stark Klein, MD;  Location: [place];  Service: General;  Laterality: N/A;  . Coronary artery bypass graft      LIMA to LAD, SVG to IM, SVG to OM2  . Embolectomy Left 03/21/2014    Procedure: LEFT BRACHIAL EMBOLECTOMY;  Surgeon: Elam Dutch, MD;  Location: [place];  Service: Vascular;  Laterality: Left;     reports that he quit smoking about 35 years ago. His smoking use included Cigarettes. He smoked 0.00  packs per day for 0 years. He has never used smokeless tobacco. He reports that he does not drink alcohol or use illicit drugs. Walks with assistance  No Known Allergies  Family History  Problem Relation Age of Onset  . Heart disease Mother   . Heart disease Sister   . Cancer - Prostate Son       Prior to Admission medications   Medication Sig Start Date End Date Taking? Authorizing Provider  acetaminophen (TYLENOL) 325 MG tablet Take 2 tablets (650 mg total) by mouth every 6 (six) hours as needed for mild pain (or Fever >/= 101). 03/14/14   Bynum Bellows, MD  carvedilol (COREG) 3.125 MG tablet Take 1 tablet (3.125 mg total) by mouth 2 (two) times daily with a meal. 06/11/15   Orson Eva, MD  cefTRIAXone 2 g in dextrose 5 % 50 mL Inject 2 g into the vein daily. Last dose on 07/16/15 06/12/15   Orson Eva, MD  furosemide (LASIX) 40 MG tablet Take 1 tablet (40 mg total) by mouth 2 (two) times daily. 03/14/14   Bynum Bellows, MD  Multiple Vitamin (MULTIVITAMIN WITH MINERALS) TABS Take 1 tablet by mouth daily.    Historical Provider, MD  polyethylene glycol (MIRALAX / GLYCOLAX) packet Take 17 g by mouth daily as needed for mild constipation. 03/14/14   Srikar Janna Arch, MD  potassium chloride (K-DUR) 10 MEQ tablet Take 1 tablet (10 mEq total) by mouth daily. 06/11/15   Orson Eva, MD  senna-docusate (SENOKOT-S) 8.6-50 MG tablet Take 2 tablets by mouth 2 (two) times daily as needed for mild constipation.     Historical Provider, MD  simvastatin (ZOCOR) 10 MG tablet Take 10 mg by mouth at bedtime.    Historical Provider, MD  warfarin (COUMADIN) 5 MG tablet Take 2.5 mg by mouth daily. Daily    Historical Provider, MD  warfarin (COUMADIN) 5 MG tablet TAKE AS DIRECTED BY COUMADIN CLINIC 07/15/15   Sueanne Margarita, MD    Physical Exam:    Filed Vitals:   07/28/15 0610 07/28/15 0715 07/28/15 0845 07/28/15 0915  BP: 132/84 135/94 112/70 129/60  Pulse: 105     Temp:      TempSrc:      Resp: 16 18 20 21     Height:      Weight:      SpO2: 97% 96% 99% 93%      Constitutional: NAD, calm, comfortable Filed Vitals:   07/28/15 0610 07/28/15 0715 07/28/15 0845 07/28/15 0915  BP: 132/84 135/94 112/70 129/60  Pulse: 105     Temp:      TempSrc:      Resp: 16 18 20 21   Height:      Weight:      SpO2: 97% 96% 99% 93%   Eyes: PERRL, lids and conjunctivae normal ENMT: Mucous membranes are moist. Posterior pharynx clear of any  exudate or lesions.edentulous Neck: normal, supple, no masses, no thyromegaly Respiratory: clear to auscultation bilaterally, no wheezing, no crackles. Normal respiratory effort. No accessory muscle use.  Cardiovascular: irregularly regular rate and rhythm, no murmurs / rubs / gallops. No extremity edema. 2+ pedal pulses. No carotid bruits.  Abdomen: no tenderness, no masses palpated. No hepatosplenomegaly. Bowel soundsactive  Musculoskeletal: no clubbing / cyanosis. No joint deformity upper and lower extremities. Good ROM, no contractures. Normal muscle tone.  Skin: no rashes, lesions, ulcers. No induration Neurologic: CN 2-12 grossly intact. Sensation intact, DTR normal. Strength 5/5 in all 4.  Psychiatric: Normal judgment and insight. Alert and oriented x 3. Normal mood.     Labs on Admission: I have personally reviewed following labs and imaging studies  CBC:  Recent Labs Lab 07/28/15 0619  WBC 11.4*  NEUTROABS 9.9*  HGB 12.4*  HCT 38.8*  MCV 91.7  PLT 100*    Basic Metabolic Panel:  Recent Labs Lab 07/28/15 0619  NA 139  K 3.7  CL 103  CO2 23  GLUCOSE 123*  BUN 25*  CREATININE 1.90*  CALCIUM 9.1    GFR: Estimated Creatinine Clearance: 22.1 mL/min (by C-G formula based on Cr of 1.9).  Liver Function Tests:  Recent Labs Lab 07/28/15 0619  AST 40  ALT 29  ALKPHOS 47  BILITOT 1.9*  PROT 6.5  ALBUMIN 3.7   No results for input(s): LIPASE, AMYLASE in the last 168 hours. No results for input(s): AMMONIA in the last 168  hours.  Coagulation Profile:  Recent Labs Lab 07/28/15 0619  INR 2.76*    Cardiac Enzymes:  Recent Labs Lab 07/28/15 0619  TROPONINI 0.08*    Urine analysis:    Component Value Date/Time   COLORURINE YELLOW 07/28/2015 [place] 07/28/2015 0733   LABSPEC 1.020 07/28/2015 0733   PHURINE 5.0 07/28/2015 0733   GLUCOSEU NEGATIVE 07/28/2015 0733   HGBUR TRACE* 07/28/2015 0733   BILIRUBINUR NEGATIVE 07/28/2015 0733   KETONESUR NEGATIVE 07/28/2015 0733   PROTEINUR 30* 07/28/2015 0733   UROBILINOGEN 1.0 03/09/2014 1947   NITRITE NEGATIVE 07/28/2015 0733   LEUKOCYTESUR NEGATIVE 07/28/2015 0733    Sepsis Labs: @LABRCNTIP (procalcitonin:4,lacticidven:4) ) Recent Results (from the past 240 hour(s))  C difficile quick scan w PCR reflex     Status: Abnormal   Collection Time: 07/28/15  8:35 AM  Result Value Ref Range Status   C Diff antigen POSITIVE (A) NEGATIVE Final   C Diff toxin POSITIVE (A) NEGATIVE Final   C Diff interpretation Positive for toxigenic C. difficile  Final    Comment: CRITICAL RESULT CALLED TO, READ BACK BY AND VERIFIED WITH: M.BEASLEY RN AT [id number] 07/28/15 BY A.DAVIS      Radiological Exams on Admission: Dg Abd Acute W/chest  07/28/2015  CLINICAL DATA:  Vomiting and diarrhea. EXAM: DG ABDOMEN ACUTE W/ 1V CHEST COMPARISON:  06/11/2015 chest radiograph. FINDINGS: Sternotomy wires appear aligned and intact. Mitral annuloplasty ring is in place. CABG clips overlie the mediastinum. Stable cardiomediastinal silhouette with moderate cardiomegaly. No pneumothorax. No pleural effusion. Stable elevation of the left hemidiaphragm. No overt pulmonary edema. Mild bibasilar atelectasis. Surgical sutures overlie the medial left abdomen. No disproportionately dilated small bowel loops. No significant fluid levels. Minimal stool in the colon. No evidence of pneumatosis or pneumoperitoneum. No pathologic soft tissue calcifications. IMPRESSION: 1. Stable moderate  cardiomegaly without overt pulmonary edema. 2. Mild bibasilar atelectasis. 3. Nonobstructive bowel gas pattern. Electronically Signed   By: Janina Mayo.D.  On: 07/28/2015 08:19    EKG: Independently reviewed.  Assessment/Plan Active Problems:   Chronic a-fib (HCC)   HTN (hypertension)   S/P MVR (mitral valve replacement)-St Jude 2002   GIB (gastrointestinal bleeding)   S/P CABG x 3 [id number]   Chronic systolic heart failure (HCC)   Chronic renal disease, stage III   Dehydration   C. difficile colitis    C Diff colitis: He has some tachycardia, improving with fluids, Leukocytosis. Afebrile.  -Admit to tele bed - NPO, advance as tolerated   Zofran for nausea  Pharmacy called regarding therapy, recommending oral Vanco as first line. Will follow order set. May add flagyl if symptoms do not improve - Continue hydration - may consult to GI if getting worse   Chronic diastolic heart failure, last echocardiogram 06/07/2015 echo EF 20-25%, diffuse HK, PAP 55- Increased LEE after adjustments to diuretics from hyponatremia a few weeks ago  dry weight 129, today 131 kg  - Careful use of IVF - Continue Coreg, reduce  Lasix to 20 mg oral  today  - Daily weights, strict I/O  Atrial Fibrillation /s/p MVR EKG Paired ventricular premature complexes LBB. No significant change since last tracing QTC 442. INR 2.76 Continue home meds Continue Coumadin per Pharmacy   Acute on Chronic kidney disease stage IIIB  baseline creatinine 1.3 , today 1.9, likely due to dehydration  -  Minimize nephrotoxins and renally dose medications Lab Results  Component Value Date   CREATININE 1.90* 07/28/2015   CREATININE 1.35* 06/11/2015   CREATININE 1.39* 06/10/2015  Careful rehydration  Hyperlipidemia Continue Zocor  Elevated troponin: No chest pain, suspect reactive, in the setting of kidney disease  EKG Paired ventricular premature complexes LBB. No significant change since last tracing QTC 442 Repeat  EKG in am  Deconditioning Consult PT/OT  DVT prophylaxis:  Coumadin  Code Status:   Full  Family Communication:  Discussed with wife  Disposition Plan: Expect patient to be discharged to home after condition improves Consults called:    None Admission status: Inpatient  [facility] E, PA-C [facility]   If 7PM-7AM, please contact night-coverage [email or web address] Password [facility]  07/28/2015, 9:58 AM

## 2015-07-28 NOTE — ED Notes (Signed)
Pt. arrived with EMS from home reports multiple diarrhea for 2 days with generalized weakness/fatigue this evening , denies emesis or fever .

## 2015-07-28 NOTE — ED Provider Notes (Addendum)
CSN: [id number]     Arrival date & time 07/28/15  0520 History   First MD Initiated Contact with Patient 07/28/15 [phone]     Chief Complaint  Patient presents with  . Diarrhea  . Fatigue  . Weakness     (Consider location/radiation/quality/duration/timing/severity/associated sxs/prior Treatment) Patient brought to the emergency department from home for generalized weakness. Patient comes by EMS. Patient reports that he has had diarrhea for 2 days. Diarrhea has been frequent, watery and yellow in nature. He has not noticed any blood in his stool. He is denying any abdominal pain. He has not had nausea or vomiting. Patient denies chest pain and shortness of breath.  EMS report that the patient initially was extremely weak, had difficulty getting up but ultimately was able to get up and ambulate on his own. They have documented atrial fibrillation with rapid ventricular response between 110 and 170.   Patient is a 82 y.o. male presenting with diarrhea and weakness.  Diarrhea Weakness    Past Medical History  Diagnosis Date  . Hypertension   . Shortness of breath     "w/any activity lately" (02/03/2012)  . CHF (congestive heart failure) ([place])     mild/note 02/03/2012  . Arthritis     "left foot; fingers" (02/03/2012)  . ARF (acute renal failure) ([place])   . GIB (gastrointestinal bleeding)   . Chronic atrial fibrillation ([place])   . Non-sustained ventricular tachycardia ([place])   . Chronic anticoagulation   . Coronary artery disease 05/2000    s/p CABG with LIMA to LAD, SVG to IM, SVG to OM  . Mitral valvular regurgitation     s/p MVR with mechanical valve  . Ischemic dilated cardiomyopathy     EF 25-30% by echo 01/2012 at time of colon surgery  . Hyperlipidemia   . Stroke [facility])     after CABG  . Carcinoma of colon ([place])   . Basal cell carcinoma of shoulder 10/2010   Past Surgical History  Procedure Laterality Date  . Inguinal hernia repair      right  . Cataract extraction w/  intraocular lens  implant, bilateral  ~ 2011  . Valve replacement  05/2000    St. Jude 35mm mechanical valve  . Cardiac valve replacement  05/2000    St. Jude 43mm  . Esophagogastroduodenoscopy  02/08/2012    Procedure: ESOPHAGOGASTRODUODENOSCOPY (EGD);  Surgeon: Gatha Mayer, MD;  Location: [facility] ENDOSCOPY;  Service: Endoscopy;  Laterality: N/A;  . Colonoscopy  02/08/2012    Procedure: COLONOSCOPY;  Surgeon: Gatha Mayer, MD;  Location: [place];  Service: Endoscopy;  Laterality: N/A;  . Laparoscopic partial colectomy  02/10/2012    Procedure: LAPAROSCOPIC PARTIAL COLECTOMY;  Surgeon: Stark Klein, MD;  Location: [place];  Service: General;  Laterality: N/A;  . Coronary artery bypass graft      LIMA to LAD, SVG to IM, SVG to OM2  . Embolectomy Left 03/21/2014    Procedure: LEFT BRACHIAL EMBOLECTOMY;  Surgeon: Elam Dutch, MD;  Location: [facility] OR;  Service: Vascular;  Laterality: Left;   Family History  Problem Relation Age of Onset  . Heart disease Mother   . Heart disease Sister   . Cancer - Prostate Son    Social History  Substance Use Topics  . Smoking status: Former Smoker -- 0.00 packs/day for 0 years    Types: Cigarettes    Quit date: 03/09/1980  . Smokeless tobacco: Never Used     Comment: 02/03/2012 "quit  smoking in my 30's"  . Alcohol Use: No    Review of Systems  Gastrointestinal: Positive for diarrhea.  Neurological: Positive for weakness.  All other systems reviewed and are negative.     Allergies  Tape  Home Medications   Prior to Admission medications   Medication Sig Start Date End Date Taking? Authorizing Provider  carvedilol (COREG) 3.125 MG tablet Take 1 tablet (3.125 mg total) by mouth 2 (two) times daily with a meal. 06/11/15  Yes Orson Eva, MD  furosemide (LASIX) 40 MG tablet Take 1 tablet (40 mg total) by mouth 2 (two) times daily. 03/14/14  Yes Bynum Bellows, MD  Multiple Vitamin (MULTIVITAMIN WITH MINERALS) TABS Take 1 tablet by mouth daily.   Yes  Historical Provider, MD  potassium chloride (K-DUR) 10 MEQ tablet Take 1 tablet (10 mEq total) by mouth daily. 06/11/15  Yes Orson Eva, MD  simvastatin (ZOCOR) 10 MG tablet Take 10 mg by mouth at bedtime.   Yes Historical Provider, MD  warfarin (COUMADIN) 5 MG tablet Take 2.5 mg by mouth daily. Sun/Mon/Tues/Wed/Thurs/Fri/Sat (evenings at 1730)   Yes Historical Provider, MD  acetaminophen (TYLENOL) 325 MG tablet Take 2 tablets (650 mg total) by mouth every 6 (six) hours as needed for mild pain (or Fever >/= 101). 03/14/14   Srikar Janna Arch, MD  cefTRIAXone 2 g in dextrose 5 % 50 mL Inject 2 g into the vein daily. Last dose on 07/16/15 06/12/15   Orson Eva, MD   BP 102/44 mmHg  Pulse 67  Temp(Src) 97.6 F (36.4 C) (Oral)  Resp 18  Ht 5\' 7"  (1.702 m)  Wt 138 lb 14.4 oz (63.005 kg)  BMI 21.75 kg/m2  SpO2 97% Physical Exam  Constitutional: He is oriented to person, place, and time. He appears well-developed and well-nourished. No distress.  HENT:  Head: Normocephalic and atraumatic.  Right Ear: Hearing normal.  Left Ear: Hearing normal.  Nose: Nose normal.  Mouth/Throat: Oropharynx is clear and moist and mucous membranes are normal.  Eyes: Conjunctivae and EOM are normal. Pupils are equal, round, and reactive to light.  Neck: Normal range of motion. Neck supple.  Cardiovascular: S1 normal and S2 normal.  An irregularly irregular rhythm present. Tachycardia present.  Exam reveals no gallop and no friction rub.   No murmur heard. Pulmonary/Chest: Effort normal and breath sounds normal. No respiratory distress. He exhibits no tenderness.  Abdominal: Soft. Normal appearance and bowel sounds are normal. There is no hepatosplenomegaly. There is no tenderness. There is no rebound, no guarding, no tenderness at McBurney's point and negative Murphy's sign. No hernia.  Musculoskeletal: Normal range of motion.  Neurological: He is alert and oriented to person, place, and time. He has normal strength. No  cranial nerve deficit or sensory deficit. Coordination normal. GCS eye subscore is 4. GCS verbal subscore is 5. GCS motor subscore is 6.  Skin: Skin is warm, dry and intact. No rash noted. No cyanosis.  Psychiatric: He has a normal mood and affect. His speech is normal and behavior is normal. Thought content normal.  Nursing note and vitals reviewed.   ED Course  Procedures (including critical care time) Labs Review Labs Reviewed  C DIFFICILE QUICK SCREEN W PCR REFLEX - Abnormal; Notable for the following:    C Diff antigen POSITIVE (*)    C Diff toxin POSITIVE (*)    All other components within normal limits  CBC WITH DIFFERENTIAL/PLATELET - Abnormal; Notable for the following:  WBC 11.4 (*)    Hemoglobin 12.4 (*)    HCT 38.8 (*)    Platelets 100 (*)    Neutro Abs 9.9 (*)    Lymphs Abs 0.5 (*)    All other components within normal limits  COMPREHENSIVE METABOLIC PANEL - Abnormal; Notable for the following:    Glucose, Bld 123 (*)    BUN 25 (*)    Creatinine, Ser 1.90 (*)    Total Bilirubin 1.9 (*)    GFR calc non Af Amer 30 (*)    GFR calc Af Amer 34 (*)    All other components within normal limits  TROPONIN I - Abnormal; Notable for the following:    Troponin I 0.08 (*)    All other components within normal limits  URINALYSIS, ROUTINE W REFLEX MICROSCOPIC (NOT AT [facility]) - Abnormal; Notable for the following:    Hgb urine dipstick TRACE (*)    Protein, ur 30 (*)    All other components within normal limits  PROTIME-INR - Abnormal; Notable for the following:    Prothrombin Time 28.8 (*)    INR 2.76 (*)    All other components within normal limits  URINE MICROSCOPIC-ADD ON - Abnormal; Notable for the following:    Squamous Epithelial / LPF 0-5 (*)    Bacteria, UA RARE (*)    Casts HYALINE CASTS (*)    All other components within normal limits  COMPREHENSIVE METABOLIC PANEL - Abnormal; Notable for the following:    Potassium 2.9 (*)    Chloride 112 (*)    Creatinine,  Ser 1.40 (*)    Calcium 8.5 (*)    Total Protein 5.0 (*)    Albumin 2.8 (*)    GFR calc non Af Amer 43 (*)    GFR calc Af Amer 50 (*)    All other components within normal limits  CBC - Abnormal; Notable for the following:    RBC 3.93 (*)    Hemoglobin 11.3 (*)    HCT 35.4 (*)    Platelets 89 (*)    All other components within normal limits  PROTIME-INR - Abnormal; Notable for the following:    Prothrombin Time 37.1 (*)    INR 3.88 (*)    All other components within normal limits    Imaging Review Dg Abd Acute W/chest  07/28/2015  CLINICAL DATA:  Vomiting and diarrhea. EXAM: DG ABDOMEN ACUTE W/ 1V CHEST COMPARISON:  06/11/2015 chest radiograph. FINDINGS: Sternotomy wires appear aligned and intact. Mitral annuloplasty ring is in place. CABG clips overlie the mediastinum. Stable cardiomediastinal silhouette with moderate cardiomegaly. No pneumothorax. No pleural effusion. Stable elevation of the left hemidiaphragm. No overt pulmonary edema. Mild bibasilar atelectasis. Surgical sutures overlie the medial left abdomen. No disproportionately dilated small bowel loops. No significant fluid levels. Minimal stool in the colon. No evidence of pneumatosis or pneumoperitoneum. No pathologic soft tissue calcifications. IMPRESSION: 1. Stable moderate cardiomegaly without overt pulmonary edema. 2. Mild bibasilar atelectasis. 3. Nonobstructive bowel gas pattern. Electronically Signed   By: Ilona Sorrel M.D.   On: 07/28/2015 08:19   I have personally reviewed and evaluated these images and lab results as part of my medical decision-making.   EKG Interpretation   Date/Time:  Sunday Jul 28 2015 05:24:47 EDT Ventricular Rate:  114 PR Interval:    QRS Duration: 148 QT Interval:  321 QTC Calculation: 442 R Axis:   -45 Text Interpretation:  Atrial fibrillation Paired ventricular premature  complexes Left bundle branch  block No significant change since last  tracing Confirmed by [facility]  MD,  Mclane Arora [phone]) on 07/28/2015 7:03:48  AM      MDM   Final diagnoses:  Diarrhea, unspecified type  AKI (acute kidney injury) ([place])  Atrial fibrillation with RVR ([place])    Patient presents to the ER for evaluation of diarrhea that has been ongoing for several days. Patient's complaining of profuse yellow foul-smelling diarrhea. He was recently hospitalized for cellulitis and strep bacteremia, having been treated with multiple antibiotics. C. difficile is suspected. Patient has evidence of dehydration and acute kidney injury. He is chronically in atrial fibrillation, arrived exhibiting a rapid ventricular response. This, however, has improved with IV fluids. Patient will require hospitalization for continued gentle hydration and testing for C. difficile.    Orpah Greek, MD 07/29/15 0725  Orpah Greek, MD 10/11/15 [id number]

## 2015-07-29 DIAGNOSIS — I5022 Chronic systolic (congestive) heart failure: Secondary | ICD-10-CM

## 2015-07-29 LAB — CBC
HEMATOCRIT: 35.4 % — AB (ref 39.0–52.0)
HEMOGLOBIN: 11.3 g/dL — AB (ref 13.0–17.0)
MCH: 28.8 pg (ref 26.0–34.0)
MCHC: 31.9 g/dL (ref 30.0–36.0)
MCV: 90.1 fL (ref 78.0–100.0)
Platelets: 89 10*3/uL — ABNORMAL LOW (ref 150–400)
RBC: 3.93 MIL/uL — AB (ref 4.22–5.81)
RDW: 14.3 % (ref 11.5–15.5)
WBC: 9.6 10*3/uL (ref 4.0–10.5)

## 2015-07-29 LAB — COMPREHENSIVE METABOLIC PANEL
ALT: 33 U/L (ref 17–63)
ANION GAP: 6 (ref 5–15)
AST: 37 U/L (ref 15–41)
Albumin: 2.8 g/dL — ABNORMAL LOW (ref 3.5–5.0)
Alkaline Phosphatase: 44 U/L (ref 38–126)
BILIRUBIN TOTAL: 1.1 mg/dL (ref 0.3–1.2)
BUN: 19 mg/dL (ref 6–20)
CHLORIDE: 112 mmol/L — AB (ref 101–111)
CO2: 22 mmol/L (ref 22–32)
Calcium: 8.5 mg/dL — ABNORMAL LOW (ref 8.9–10.3)
Creatinine, Ser: 1.4 mg/dL — ABNORMAL HIGH (ref 0.61–1.24)
GFR, EST AFRICAN AMERICAN: 50 mL/min — AB (ref >60)
GFR, EST NON AFRICAN AMERICAN: 43 mL/min — AB (ref >60)
Glucose, Bld: 91 mg/dL (ref 65–99)
POTASSIUM: 2.9 mmol/L — AB (ref 3.5–5.1)
Sodium: 140 mmol/L (ref 135–145)
TOTAL PROTEIN: 5 g/dL — AB (ref 6.5–8.1)

## 2015-07-29 LAB — PROTIME-INR
INR: 3.88 — AB (ref 0.00–1.49)
Prothrombin Time: 37.1 seconds — ABNORMAL HIGH (ref 11.6–15.2)

## 2015-07-29 MED ORDER — WARFARIN SODIUM 1 MG PO TABS
0.5000 mg | ORAL_TABLET | Freq: Once | ORAL | Status: AC
Start: 2015-07-29 — End: 2015-07-29
  Administered 2015-07-29: 0.5 mg via ORAL
  Filled 2015-07-29: qty 1

## 2015-07-29 MED ORDER — POTASSIUM CHLORIDE CRYS ER 20 MEQ PO TBCR
20.0000 meq | EXTENDED_RELEASE_TABLET | Freq: Once | ORAL | Status: AC
  Administered 2015-07-29: 20 meq via ORAL
  Filled 2015-07-29: qty 1

## 2015-07-29 MED ORDER — MUPIROCIN CALCIUM 2 % EX CREA
TOPICAL_CREAM | Freq: Two times a day (BID) | CUTANEOUS | Status: DC
  Administered 2015-07-29 – 2015-07-31 (×4): via TOPICAL
  Administered 2015-07-31: 1 via TOPICAL
  Administered 2015-08-01: 10:00:00 via TOPICAL
  Filled 2015-07-29 (×2): qty 15

## 2015-07-29 MED ORDER — FUROSEMIDE 20 MG PO TABS
20.0000 mg | ORAL_TABLET | Freq: Two times a day (BID) | ORAL | Status: DC
  Administered 2015-07-30 – 2015-08-01 (×5): 20 mg via ORAL
  Filled 2015-07-29 (×5): qty 1

## 2015-07-29 NOTE — Progress Notes (Signed)
PT Cancellation Note  Patient Details Name: Alfred Dennis MRN: [id number] DOB: [birth year]   Cancelled Treatment:    Reason Eval/Treat Not Completed: Medical issues which prohibited therapy.  Check later as time and pt allow.   Ramond Dial 07/29/2015, 8:45 AM    Mee Hives, PT MS Acute Rehab Dept. Number: [place]

## 2015-07-29 NOTE — Care Management Obs Status (Signed)
[place] NOTIFICATION   Patient Details  Name: SCOEY MONTY MRN: [id number] Date of Birth: 1923/08/05   Medicare Observation Status Notification Given:  Yes    Dawayne Patricia, RN 07/29/2015, 12:35 PM

## 2015-07-29 NOTE — Care Management Note (Signed)
Case Management Note Marvetta Gibbons RN, BSN Unit 2W-Case Manager [phone]  Patient Details  Name: Alfred Dennis MRN: [id number] Date of Birth: 05/21/1923  Subjective/Objective:   Pt admitted with +cdiff                 Action/Plan: PTA pt lived at home with wife/daughter- pt uses cane at home- per daughter pt was active with [facility] for home IV abx (last dose 5/19)-  CM to follow for any d/c needs.   Expected Discharge Date:                  Expected Discharge Plan:  [place]  In-House Referral:     Discharge planning Services  CM Consult  Post Acute Care Choice:  [place], Resumption of Svcs/PTA Provider Choice offered to:     DME Arranged:    DME Agency:     HH Arranged:    [place] Agency:     Status of Service:  In process, will continue to follow  Medicare Important Message Given:    Date Medicare IM Given:    Medicare IM give by:    Date Additional Medicare IM Given:    Additional Medicare Important Message give by:     If discussed at [place] of Stay Meetings, dates discussed:    Additional Comments:  Dawayne Patricia, RN 07/29/2015, 2:21 PM

## 2015-07-29 NOTE — Progress Notes (Signed)
[place] for warfarin Indication: atrial fibrillation and hx mech valve  Allergies  Allergen Reactions  . Tape Other (See Comments)    CERTAIN BANDAGES CAN TEAR THE SKIN; PLEASE USE PAPER TAPE    Patient Measurements: Height: 5\' 7"  (170.2 cm) Weight: 138 lb 14.4 oz (63.005 kg) IBW/kg (Calculated) : 66.1  Vital Signs: Temp: 98.1 F (36.7 C) (05/22 1106) Temp Source: Oral (05/22 1106) BP: 127/45 mmHg (05/22 0808) Pulse Rate: 46 (05/22 1106)  Labs:  Recent Labs  07/28/15 0619 07/29/15 0318  HGB 12.4* 11.3*  HCT 38.8* 35.4*  PLT 100* 89*  LABPROT 28.8* 37.1*  INR 2.76* 3.88*  CREATININE 1.90* 1.40*  TROPONINI 0.08*  --     Estimated Creatinine Clearance: 31.9 mL/min (by C-G formula based on Cr of 1.4).   Medical History: Past Medical History  Diagnosis Date  . Hypertension   . Shortness of breath     "w/any activity lately" (02/03/2012)  . CHF (congestive heart failure) ([place])     mild/note 02/03/2012  . Arthritis     "left foot; fingers" (02/03/2012)  . ARF (acute renal failure) ([place])   . GIB (gastrointestinal bleeding)   . Chronic atrial fibrillation ([place])   . Non-sustained ventricular tachycardia ([place])   . Chronic anticoagulation   . Coronary artery disease 05/2000    s/p CABG with LIMA to LAD, SVG to IM, SVG to OM  . Mitral valvular regurgitation     s/p MVR with mechanical valve  . Ischemic dilated cardiomyopathy     EF 25-30% by echo 01/2012 at time of colon surgery  . Hyperlipidemia   . Stroke [facility])     after CABG  . Carcinoma of colon ([place])   . Basal cell carcinoma of shoulder 10/2010   Assessment: 18 yof presented to the hospital with diarrhea and found to have CDiff. Recently completed a prolonged course of IV antibiotics. He is chronically anticoagulated with warfarin for history of afib and a mechanical valve. INR is supratherapeutic at 2.7 > 3.8 today.  Unclear what caused the jump in INR.   PTA  warfarin: 2.5 mg po daily  Goal of Therapy:  INR 2.5-3.5 Monitor platelets by anticoagulation protocol: Yes   Plan:  - Warfarin 0.5 mg po x1 - Daily INR  Chesnie Capell, Jake Church 07/29/2015,1:10 PM

## 2015-07-29 NOTE — Progress Notes (Signed)
[facility] PROGRESS NOTE  Alfred Dennis [id number] DOB: 04/13/1923 DOA: 07/28/2015  PCP: Osborne Casco, MD  Brief HPI: 82 year old male with a past medical history of chronic atrial fibrillation, congestive heart failure, and reactive disease, hyperlipidemia, who was recently hospitalized for sepsis due to group B streptococcus bacteremia and cellulitis. He was given IV ceftriaxone for 6 weeks. He presented with the diarrhea. Found to have C. difficile and was hospitalized for further management.  Past medical history:  Past Medical History  Diagnosis Date  . Hypertension   . Shortness of breath     "w/any activity lately" (02/03/2012)  . CHF (congestive heart failure) ([place])     mild/note 02/03/2012  . Arthritis     "left foot; fingers" (02/03/2012)  . ARF (acute renal failure) ([place])   . GIB (gastrointestinal bleeding)   . Chronic atrial fibrillation ([place])   . Non-sustained ventricular tachycardia ([place])   . Chronic anticoagulation   . Coronary artery disease 05/2000    s/p CABG with LIMA to LAD, SVG to IM, SVG to OM  . Mitral valvular regurgitation     s/p MVR with mechanical valve  . Ischemic dilated cardiomyopathy     EF 25-30% by echo 01/2012 at time of colon surgery  . Hyperlipidemia   . Stroke [facility])     after CABG  . Carcinoma of colon ([place])   . Basal cell carcinoma of shoulder 10/2010    Consultants: None  Procedures: None  Antibiotics: Oral vancomycin  Subjective: Patient feels well this morning. He thinks he hasn't had much stool output over the last 24 hours, although he doesn't know because he has a rectal tube. Denies any abdominal pain, nausea or vomiting. No dizziness or lightheadedness.  Objective:  Vital Signs  Filed Vitals:   07/28/15 2047 07/29/15 0615 07/29/15 0808 07/29/15 1106  BP: 122/44 102/44 127/45   Pulse: 64 67 64 46  Temp: 98.1 F (36.7 C) 97.6 F (36.4 C) 97.5 F (36.4 C) 98.1 F (36.7 C)  TempSrc: Oral  Oral Oral Oral  Resp: 18 18 18 18   Height:      Weight:  63.005 kg (138 lb 14.4 oz)    SpO2: 99% 97% 100% 100%    Intake/Output Summary (Last 24 hours) at 07/29/15 1207 Last data filed at 07/29/15 0900  Gross per 24 hour  Intake   1265 ml  Output      0 ml  Net   1265 ml   Filed Weights   07/28/15 0531 07/29/15 0615  Weight: 59.421 kg (131 lb) 63.005 kg (138 lb 14.4 oz)    General appearance: alert, cooperative, appears stated age and no distress Resp: clear to auscultation bilaterally Cardio: regular rate and rhythm, S1, S2 normal, no murmur, click, rub or gallop GI: soft, non-tender; bowel sounds normal; no masses,  no organomegaly. He is found to have a left-sided inguinal hernia which is reducible. Extremities: extremities normal, atraumatic, no cyanosis or edema Neurologic: Awake and alert. Oriented 3. No focal neurological deficits.  Lab Results:  Data Reviewed: I have personally reviewed following labs and imaging studies  CBC:  Recent Labs Lab 07/28/15 0619 07/29/15 0318  WBC 11.4* 9.6  NEUTROABS 9.9*  --   HGB 12.4* 11.3*  HCT 38.8* 35.4*  MCV 91.7 90.1  PLT 100* 89*   Basic Metabolic Panel:  Recent Labs Lab 07/28/15 0619 07/29/15 0318  NA 139 140  K 3.7 2.9*  CL 103 112*  CO2 23  22  GLUCOSE 123* 91  BUN 25* 19  CREATININE 1.90* 1.40*  CALCIUM 9.1 8.5*   GFR: Estimated Creatinine Clearance: 31.9 mL/min (by C-G formula based on Cr of 1.4).  Liver Function Tests:  Recent Labs Lab 07/28/15 0619 07/29/15 0318  AST 40 37  ALT 29 33  ALKPHOS 47 44  BILITOT 1.9* 1.1  PROT 6.5 5.0*  ALBUMIN 3.7 2.8*   Coagulation Profile:  Recent Labs Lab 07/28/15 0619 07/29/15 0318  INR 2.76* 3.88*   Cardiac Enzymes:  Recent Labs Lab 07/28/15 0619  TROPONINI 0.08*   Urine analysis:    Component Value Date/Time   COLORURINE YELLOW 07/28/2015 [place] 07/28/2015 0733   LABSPEC 1.020 07/28/2015 0733   PHURINE 5.0  07/28/2015 [place] 07/28/2015 0733   HGBUR TRACE* 07/28/2015 [place] 07/28/2015 0733   KETONESUR NEGATIVE 07/28/2015 0733   PROTEINUR 30* 07/28/2015 0733   UROBILINOGEN 1.0 03/09/2014 1947   NITRITE NEGATIVE 07/28/2015 0733   LEUKOCYTESUR NEGATIVE 07/28/2015 0733    Recent Results (from the past 240 hour(s))  C difficile quick scan w PCR reflex     Status: Abnormal   Collection Time: 07/28/15  8:35 AM  Result Value Ref Range Status   C Diff antigen POSITIVE (A) NEGATIVE Final   C Diff toxin POSITIVE (A) NEGATIVE Final   C Diff interpretation Positive for toxigenic C. difficile  Final    Comment: CRITICAL RESULT CALLED TO, READ BACK BY AND VERIFIED WITH: M.BEASLEY RN AT [id number] 07/28/15 BY A.DAVIS       Radiology Studies: Dg Abd Acute W/chest  07/28/2015  CLINICAL DATA:  Vomiting and diarrhea. EXAM: DG ABDOMEN ACUTE W/ 1V CHEST COMPARISON:  06/11/2015 chest radiograph. FINDINGS: Sternotomy wires appear aligned and intact. Mitral annuloplasty ring is in place. CABG clips overlie the mediastinum. Stable cardiomediastinal silhouette with moderate cardiomegaly. No pneumothorax. No pleural effusion. Stable elevation of the left hemidiaphragm. No overt pulmonary edema. Mild bibasilar atelectasis. Surgical sutures overlie the medial left abdomen. No disproportionately dilated small bowel loops. No significant fluid levels. Minimal stool in the colon. No evidence of pneumatosis or pneumoperitoneum. No pathologic soft tissue calcifications. IMPRESSION: 1. Stable moderate cardiomegaly without overt pulmonary edema. 2. Mild bibasilar atelectasis. 3. Nonobstructive bowel gas pattern. Electronically Signed   By: Ilona Sorrel M.D.   On: 07/28/2015 08:19     Medications:  Scheduled: . carvedilol  3.125 mg Oral BID WC  . [START ON 07/30/2015] furosemide  20 mg Oral BID  . potassium chloride  10 mEq Oral Daily  . simvastatin  10 mg Oral QHS  . sodium chloride flush   3 mL Intravenous Q12H  . vancomycin  125 mg Oral QID  . warfarin  2.5 mg Oral q1800  . Warfarin - Pharmacist Dosing Inpatient   Does not apply q1800   Continuous:  [id number] **OR** acetaminophen, bisacodyl, HYDROcodone-acetaminophen, magnesium citrate, morphine injection, ondansetron **OR** ondansetron (ZOFRAN) IV, senna-docusate, traZODone  Assessment/Plan:  Active Problems:   Chronic a-fib (HCC)   HTN (hypertension)   S/P MVR (mitral valve replacement)-St Jude 2002   GIB (gastrointestinal bleeding)   S/P CABG x 3 [id number]   Chronic systolic heart failure (HCC)   Chronic renal disease, stage III   Dehydration   C. difficile colitis   Clostridium difficile diarrhea    C Diff colitis Patient's diarrhea appears to be slowing down. Discontinue rectal tube. Continue oral vancomycin. Start mobilizing. Cut back on IV  fluids.  Chronic systolic and diastolic heart failure Last echocardiogram 06/07/2015 echo EF 20-25%, diffuse HK, PAP 55. Continue diuretics. Cut back on IV fluids. Daily weights and strict ins and outs. Appears to be reasonably well compensated at this time. No pulmonary edema noted on chest x-ray.  Atrial Fibrillation /s/p MVR Appears to be stable. Patient is on warfarin, which will be continued per pharmacy. Continue home medications. Noted to be on beta blocker.   Acute on Chronic kidney disease stage IIIB Baseline creatinine 1.3. Creatinine was noted to be 1.9 at admission. Patient was thought to be slightly dehydrated due to his diarrhea. He was gently hydrated. Creatinine is improving. Stop IV fluids. Monitor urine output. Replace potassium.  Hyperlipidemia Continue Zocor  Elevated troponin Patient denies any chest pain. Slight elevation in troponin is likely due to his renal disease. EKG did not show any ischemic changes. Shows his baseline atrial fibrillation. Patient has known LBBB.  Deconditioning Consult PT/OT   DVT Prophylaxis: Patient is on  warfarin    Code Status: Full code  Family Communication: Discussed with the patient. No family at bedside  Disposition Plan: Continue treatment as outlined above. Mobilize.    LOS: 1 day   [place] [facility] Pager [phone] 07/29/2015, 12:07 PM  If 7PM-7AM, please contact night-coverage at [email or web address], password [facility]

## 2015-07-29 NOTE — Progress Notes (Signed)
Utilization review completed.  

## 2015-07-30 LAB — CBC
HEMATOCRIT: 34.6 % — AB (ref 39.0–52.0)
Hemoglobin: 11.1 g/dL — ABNORMAL LOW (ref 13.0–17.0)
MCH: 28.7 pg (ref 26.0–34.0)
MCHC: 32.1 g/dL (ref 30.0–36.0)
MCV: 89.4 fL (ref 78.0–100.0)
PLATELETS: 95 10*3/uL — AB (ref 150–400)
RBC: 3.87 MIL/uL — ABNORMAL LOW (ref 4.22–5.81)
RDW: 14.5 % (ref 11.5–15.5)
WBC: 7.5 10*3/uL (ref 4.0–10.5)

## 2015-07-30 LAB — BASIC METABOLIC PANEL
ANION GAP: 4 — AB (ref 5–15)
BUN: 16 mg/dL (ref 6–20)
CALCIUM: 8.5 mg/dL — AB (ref 8.9–10.3)
CO2: 22 mmol/L (ref 22–32)
CREATININE: 1.24 mg/dL (ref 0.61–1.24)
Chloride: 111 mmol/L (ref 101–111)
GFR calc Af Amer: 58 mL/min — ABNORMAL LOW (ref >60)
GFR calc non Af Amer: 50 mL/min — ABNORMAL LOW (ref >60)
GLUCOSE: 83 mg/dL (ref 65–99)
Potassium: 3.6 mmol/L (ref 3.5–5.1)
Sodium: 137 mmol/L (ref 135–145)

## 2015-07-30 LAB — PROTIME-INR
INR: 4.14 — ABNORMAL HIGH (ref 0.00–1.49)
Prothrombin Time: 39 seconds — ABNORMAL HIGH (ref 11.6–15.2)

## 2015-07-30 NOTE — Progress Notes (Signed)
[facility]  Patient Status: Active (receiving services up to time of hospitalization)  [facility] is providing the following services: RN  If patient discharges after hours, please call [phone].   Alfred Dennis Alfred Dennis 07/30/2015, 8:15 AM

## 2015-07-30 NOTE — Evaluation (Signed)
Occupational Therapy Evaluation Patient Details Name: Alfred Dennis MRN: [id number] DOB: 09-Dec-1923 Today's Date: 07/30/2015    History of Present Illness Pt adm with c-diff, dehydration. PMH - ischemic cardiomyopathy EF 25-35 percent, hypertension, mitral valve replacement, Persistent atrial fibrillation, colon cancer status post right hemicolectomy.   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 He presents with generalized weakness and impaired balance interfering with ability to perform self care and mobility, overall performing at a min assist level. Anticipate pt will progress well for discharge home with supervision of his daughter. Will follow.    Follow Up Recommendations  Home health OT    Equipment Recommendations  None recommended by OT    Recommendations for Other Services       Precautions / Restrictions Precautions Precautions: Fall Restrictions Weight Bearing Restrictions: No      Mobility Bed Mobility Overal bed mobility: Needs Assistance Bed Mobility: Supine to Sit;Sit to Supine     Supine to sit: Min assist Sit to supine: Min assist   General bed mobility comments: Assist to elevate trunk into sitting and to bring legs back up into bed  Transfers Overall transfer level: Needs assistance Equipment used: Rolling walker (2 wheeled) Transfers: Sit to/from Stand Sit to Stand: Min assist         General transfer comment: assist to rise, VC for technique    Balance Overall balance assessment: Needs assistance Sitting-balance support: No upper extremity supported;Feet supported Sitting balance-Leahy Scale: Fair     Standing balance support: Single extremity supported Standing balance-Leahy Scale: Poor Standing balance comment: UE support required                            ADL Overall ADL's : Needs assistance/impaired Eating/Feeding: Independent;Sitting   Grooming: Min guard;Wash/dry  hands;Standing   Upper Body Bathing: Set up;Sitting   Lower Body Bathing: Minimal assistance;Sit to/from stand   Upper Body Dressing : Set up;Sitting   Lower Body Dressing: Minimal assistance;Sit to/from stand   Toilet Transfer: Minimal assistance;Ambulation;RW   Toileting- Clothing Manipulation and Hygiene: Minimal assistance;Sit to/from stand       Functional mobility during ADLs: Min guard;Minimal assistance;Rolling walker       Vision     Perception     Praxis      Pertinent Vitals/Pain Pain Assessment: No/denies pain     Hand Dominance Left   Extremity/Trunk Assessment Upper Extremity Assessment Upper Extremity Assessment: Generalized weakness;Overall [facility] for tasks assessed   Lower Extremity Assessment Lower Extremity Assessment: Generalized weakness       Communication Communication Communication: HOH   Cognition Arousal/Alertness: Awake/alert Behavior During Therapy: WFL for tasks assessed/performed Overall Cognitive Status: Within Functional Limits for tasks assessed                     General Comments       Exercises       Shoulder Instructions      Home Living Family/patient expects to be discharged to:: Private residence Living Arrangements: Spouse/significant other Available Help at Discharge: Family;Available 24 hours/day (spouse has dementia, daughter can assist) Type of Home: House Home Access: Stairs to enter [employer] of Steps: 1 Entrance Stairs-Rails: Right Home Layout: One level     Bathroom Shower/Tub: Teacher, early years/pre: Standard     Home Equipment: Environmental consultant - 2 wheels;Bedside commode;Cane - quad  Prior Functioning/Environment Level of Independence: Independent        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    OT Diagnosis: Generalized weakness   OT Problem List: Decreased strength;Decreased activity tolerance;Impaired balance (sitting and/or  standing);Decreased knowledge of use of DME or AE   OT Treatment/Interventions: Self-care/ADL training;DME and/or AE instruction;Patient/family education;Balance training    OT Goals(Current goals can be found in the care plan section) Acute Rehab OT Goals Patient Stated Goal: Return home OT Goal Formulation: With patient Time For Goal Achievement: 08/06/15 Potential to Achieve Goals: Good ADL Goals Pt Will Perform Grooming: with supervision;standing Pt Will Perform Lower Body Bathing: with supervision;sit to/from stand Pt Will Perform Lower Body Dressing: sit to/from stand;with supervision Pt Will Transfer to Toilet: with supervision;ambulating;regular height toilet Pt Will Perform Toileting - Clothing Manipulation and hygiene: with supervision;sit to/from stand  OT Frequency: Min 2X/week   Barriers to D/C:            Co-evaluation              End of Session Equipment Utilized During Treatment: Gait belt;Rolling walker  Activity Tolerance: Patient tolerated treatment well Patient left: in bed;with call bell/phone within reach;with bed alarm set   Time: 1311-1325 OT Time Calculation (min): 14 min Charges:  OT General Charges $OT Visit: 1 Procedure OT Evaluation $OT Eval Moderate Complexity: 1 Procedure G-Codes:    [name] 07/30/2015, 1:36 PM  [phone]

## 2015-07-30 NOTE — Progress Notes (Addendum)
[facility] PROGRESS NOTE  Alfred Dennis [id number] DOB: Dec 13, 1923 DOA: 07/28/2015  PCP: Osborne Casco, MD  Brief HPI: 82 year old male with a past medical history of chronic atrial fibrillation, congestive heart failure, and reactive disease, hyperlipidemia, who was recently hospitalized for sepsis due to group B streptococcus bacteremia and cellulitis. He was given IV ceftriaxone for 6 weeks. He presented with the diarrhea. Found to have C. difficile and was hospitalized for further management.  Past medical history:  Past Medical History  Diagnosis Date  . Hypertension   . Shortness of breath     "w/any activity lately" (02/03/2012)  . CHF (congestive heart failure) ([place])     mild/note 02/03/2012  . Arthritis     "left foot; fingers" (02/03/2012)  . ARF (acute renal failure) ([place])   . GIB (gastrointestinal bleeding)   . Chronic atrial fibrillation ([place])   . Non-sustained ventricular tachycardia ([place])   . Chronic anticoagulation   . Coronary artery disease 05/2000    s/p CABG with LIMA to LAD, SVG to IM, SVG to OM  . Mitral valvular regurgitation     s/p MVR with mechanical valve  . Ischemic dilated cardiomyopathy     EF 25-30% by echo 01/2012 at time of colon surgery  . Hyperlipidemia   . Stroke [facility])     after CABG  . Carcinoma of colon ([place])   . Basal cell carcinoma of shoulder 10/2010    Consultants: None  Procedures: None  Antibiotics: Oral vancomycin  Subjective: Patient feels well this morning. Diarrhea seems to be slowing down. His last bowel movement was on diet. Denies any abdominal pain, nausea or vomiting. His daughter is at the bedside.   Objective:  Vital Signs  Filed Vitals:   07/29/15 1106 07/29/15 1726 07/29/15 2100 07/30/15 0442  BP:  123/70 111/57 123/59  Pulse: 46  67 63  Temp: 98.1 F (36.7 C)  97.8 F (36.6 C) 97.5 F (36.4 C)  TempSrc: Oral  Oral Oral  Resp: 18   20  Height:      Weight:    64.683 kg (142 lb  9.6 oz)  SpO2: 100%  100% 98%    Intake/Output Summary (Last 24 hours) at 07/30/15 0920 Last data filed at 07/29/15 2300  Gross per 24 hour  Intake    720 ml  Output     80 ml  Net    640 ml   Filed Weights   07/28/15 0531 07/29/15 0615 07/30/15 0442  Weight: 59.421 kg (131 lb) 63.005 kg (138 lb 14.4 oz) 64.683 kg (142 lb 9.6 oz)    General appearance: alert, cooperative, appears stated age and no distress Resp: clear to auscultation bilaterally Cardio: regular rate and rhythm, S1, S2 normal, no murmur, click, rub or gallop GI: soft, non-tender; bowel sounds normal; no masses,  no organomegaly. He is found to have a left-sided inguinal hernia which is reducible. Extremities: extremities normal, atraumatic, no cyanosis or edema Neurologic: Awake and alert. Oriented 3. No focal neurological deficits.  Lab Results:  Data Reviewed: I have personally reviewed following labs and imaging studies  CBC:  Recent Labs Lab 07/28/15 0619 07/29/15 0318 07/30/15 0219  WBC 11.4* 9.6 7.5  NEUTROABS 9.9*  --   --   HGB 12.4* 11.3* 11.1*  HCT 38.8* 35.4* 34.6*  MCV 91.7 90.1 89.4  PLT 100* 89* 95*   Basic Metabolic Panel:  Recent Labs Lab 07/28/15 0619 07/29/15 0318 07/30/15 0219  NA 139 140  137  K 3.7 2.9* 3.6  CL 103 112* 111  CO2 23 22 22   GLUCOSE 123* 91 83  BUN 25* 19 16  CREATININE 1.90* 1.40* 1.24  CALCIUM 9.1 8.5* 8.5*   GFR: Estimated Creatinine Clearance: 37 mL/min (by C-G formula based on Cr of 1.24).  Liver Function Tests:  Recent Labs Lab 07/28/15 0619 07/29/15 0318  AST 40 37  ALT 29 33  ALKPHOS 47 44  BILITOT 1.9* 1.1  PROT 6.5 5.0*  ALBUMIN 3.7 2.8*   Coagulation Profile:  Recent Labs Lab 07/28/15 0619 07/29/15 0318 07/30/15 0219  INR 2.76* 3.88* 4.14*   Cardiac Enzymes:  Recent Labs Lab 07/28/15 0619  TROPONINI 0.08*   Urine analysis:    Component Value Date/Time   COLORURINE YELLOW 07/28/2015 [place]  07/28/2015 0733   LABSPEC 1.020 07/28/2015 0733   PHURINE 5.0 07/28/2015 [place] 07/28/2015 0733   HGBUR TRACE* 07/28/2015 0733   BILIRUBINUR NEGATIVE 07/28/2015 0733   KETONESUR NEGATIVE 07/28/2015 0733   PROTEINUR 30* 07/28/2015 0733   UROBILINOGEN 1.0 03/09/2014 1947   NITRITE NEGATIVE 07/28/2015 0733   LEUKOCYTESUR NEGATIVE 07/28/2015 0733    Recent Results (from the past 240 hour(s))  C difficile quick scan w PCR reflex     Status: Abnormal   Collection Time: 07/28/15  8:35 AM  Result Value Ref Range Status   C Diff antigen POSITIVE (A) NEGATIVE Final   C Diff toxin POSITIVE (A) NEGATIVE Final   C Diff interpretation Positive for toxigenic C. difficile  Final    Comment: CRITICAL RESULT CALLED TO, READ BACK BY AND VERIFIED WITH: M.BEASLEY RN AT [id number] 07/28/15 BY A.DAVIS       Radiology Studies: No results found.   Medications:  Scheduled: . carvedilol  3.125 mg Oral BID WC  . furosemide  20 mg Oral BID  . mupirocin cream   Topical BID  . potassium chloride  10 mEq Oral Daily  . simvastatin  10 mg Oral QHS  . sodium chloride flush  3 mL Intravenous Q12H  . vancomycin  125 mg Oral QID  . Warfarin - Pharmacist Dosing Inpatient   Does not apply q1800   Continuous:  [id number] **OR** acetaminophen, bisacodyl, HYDROcodone-acetaminophen, magnesium citrate, morphine injection, ondansetron **OR** ondansetron (ZOFRAN) IV, senna-docusate, traZODone  Assessment/Plan:  Active Problems:   Chronic a-fib (HCC)   HTN (hypertension)   S/P MVR (mitral valve replacement)-St Jude 2002   GIB (gastrointestinal bleeding)   S/P CABG x 3 [id number]   Chronic systolic heart failure (HCC)   Chronic renal disease, stage III   Dehydration   C. difficile colitis   Clostridium difficile diarrhea    C Diff colitis Patient seems to be slowly improving. Continue oral vancomycin for now. Start mobilizing. Advance diet.  Chronic systolic and diastolic heart  failure Last echocardiogram 06/07/2015 echo EF 20-25%, diffuse hypokinesis, PAP 55. Continue diuretics. Daily weights and strict ins and outs. Appears to be reasonably well compensated at this time. No pulmonary edema noted on chest x-ray.  Atrial Fibrillation /s/p MVR Appears to be stable. Patient is on warfarin, which will be continued per pharmacy. Continue home medications. Noted to be on beta blocker.   Acute on Chronic kidney disease stage IIIB Baseline creatinine 1.3. Creatinine was noted to be 1.9 at admission. Patient was thought to be slightly dehydrated due to his diarrhea. He was gently hydrated. Creatinine is improved. IV fluids were discontinued. Potassium was replaced. Continue  to monitor urine output.  Hyperlipidemia Continue Zocor  Elevated troponin Patient denies any chest pain. Slight elevation in troponin is likely due to his renal disease. EKG did not show any ischemic changes. Patient has known LBBB.  Thrombocytopenia Platelet counts noted to be low but stable. Continue to monitor for now.  Deconditioning Consult PT/OT  Left-sided inguinal hernia Hernia is easily reducible. He is asymptomatic. Discussed with the patient and his daughter. Have told them that this will need follow-up as outpatient with general surgery.   DVT Prophylaxis: Patient is on warfarin    Code Status: Full code  Family Communication: Discussed with the patient and his daughter Disposition Plan: Continue treatment as outlined above. Mobilize. PT and OT consult is pending. Anticipate discharge tomorrow.    LOS: 2 days   [facility] Pager [phone] 07/30/2015, 9:20 AM  If 7PM-7AM, please contact night-coverage at [email or web address], password [facility]

## 2015-07-30 NOTE — Evaluation (Signed)
Physical Therapy Evaluation Patient Details Name: Alfred Dennis MRN: [id number] DOB: 1923-09-25 Today's Date: 07/30/2015   History of Present Illness  Pt adm with c-diff. PMH - ischemic cardiomyopathy EF 25-35 percent, hypertension, mitral valve replacement, Persistent atrial fibrillation, colon cancer status post right hemicolectomy.  Clinical Impression  Pt admitted with above diagnosis and presents to PT with functional limitations due to deficits listed below (See PT problem list). Pt needs skilled PT to maximize independence and safety to allow discharge to home with HHPT and assist of daughter as needed. Expect will progress fairly quickly.     Follow Up Recommendations Home health PT;Supervision for mobility/OOB    Equipment Recommendations  None recommended by PT    Recommendations for Other Services       Precautions / Restrictions Precautions Precautions: Fall Restrictions Weight Bearing Restrictions: No      Mobility  Bed Mobility Overal bed mobility: Needs Assistance Bed Mobility: Supine to Sit;Sit to Supine     Supine to sit: Min assist Sit to supine: Min assist   General bed mobility comments: Assist to elevate trunk into sitting and to bring legs back up into bed  Transfers Overall transfer level: Needs assistance Equipment used: Rolling walker (2 wheeled) Transfers: Sit to/from Stand Sit to Stand: Min assist         General transfer comment: VC for hand placement and assist to bring hips up.  Ambulation/Gait Ambulation/Gait assistance: Min assist;Min guard Ambulation Distance (Feet): 225 Feet Assistive device: Rolling walker (2 wheeled) Gait Pattern/deviations: Step-through pattern;Decreased step length - right;Decreased step length - left;Trunk flexed     General Gait Details: Initially min A due to unsteadiness but then improved and only min guard required. Walker veering rt unsure if due to walker itself or pt steering.  Stairs             Wheelchair Mobility    Modified Rankin (Stroke Patients Only)       Balance Overall balance assessment: Needs assistance Sitting-balance support: No upper extremity supported;Feet supported Sitting balance-Leahy Scale: Fair     Standing balance support: Single extremity supported Standing balance-Leahy Scale: Poor Standing balance comment: UE support required                             Pertinent Vitals/Pain Pain Assessment: No/denies pain    Home Living Family/patient expects to be discharged to:: Private residence Living Arrangements: Spouse/significant other Available Help at Discharge: Family;Available 24 hours/day (Daughter can stay with pt. 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     Hand Dominance   Dominant Hand: Left    Extremity/Trunk Assessment   Upper Extremity Assessment: Defer to OT evaluation           Lower Extremity Assessment: Generalized weakness         Communication   Communication: HOH  Cognition Arousal/Alertness: Awake/alert Behavior During Therapy: WFL for tasks assessed/performed Overall Cognitive Status: Within Functional Limits for tasks assessed                      General Comments      Exercises  Assessment/Plan    PT Assessment Patient needs continued PT services  PT Diagnosis Difficulty walking;Generalized weakness   PT Problem List Decreased strength;Decreased activity tolerance;Decreased balance;Decreased mobility;Decreased knowledge of use of DME  PT Treatment Interventions DME instruction;Gait training;Functional mobility training;Therapeutic activities;Therapeutic  exercise;Balance training;Patient/family education   PT Goals (Current goals can be found in the Care Plan section) Acute Rehab PT Goals Patient Stated Goal: Return home PT Goal Formulation: With patient Time For Goal Achievement: 08/06/15 Potential to Achieve Goals: Good    Frequency Min 3X/week   Barriers to discharge        Co-evaluation               End of Session Equipment Utilized During Treatment: Gait belt Activity Tolerance: Patient tolerated treatment well Patient left: in chair;with call bell/phone within reach;with chair alarm set;with family/visitor present Nurse Communication: Mobility status         Time: [id number] PT Time Calculation (min) (ACUTE ONLY): 21 min   Charges:   PT Evaluation $PT Eval Moderate Complexity: 1 Procedure     PT G Codes:        Jacklin Zwick Aug 18, 2015, 12:28 PM [facility] PT [phone]

## 2015-07-30 NOTE — Progress Notes (Signed)
[place] for warfarin Indication: atrial fibrillation and hx mech valve  Allergies  Allergen Reactions  . Tape Other (See Comments)    CERTAIN BANDAGES CAN TEAR THE SKIN; PLEASE USE PAPER TAPE    Patient Measurements: Height: 5\' 7"  (170.2 cm) Weight: 142 lb 9.6 oz (64.683 kg) IBW/kg (Calculated) : 66.1  Vital Signs: Temp: 97.5 F (36.4 C) (05/23 0442) Temp Source: Oral (05/23 0442) BP: 131/76 mmHg (05/23 1001) Pulse Rate: 72 (05/23 1001)  Labs:  Recent Labs  07/28/15 0619 07/29/15 0318 07/30/15 0219  HGB 12.4* 11.3* 11.1*  HCT 38.8* 35.4* 34.6*  PLT 100* 89* 95*  LABPROT 28.8* 37.1* 39.0*  INR 2.76* 3.88* 4.14*  CREATININE 1.90* 1.40* 1.24  TROPONINI 0.08*  --   --     Estimated Creatinine Clearance: 37 mL/min (by C-G formula based on Cr of 1.24).   Medical History: Past Medical History  Diagnosis Date  . Hypertension   . Shortness of breath     "w/any activity lately" (02/03/2012)  . CHF (congestive heart failure) ([place])     mild/note 02/03/2012  . Arthritis     "left foot; fingers" (02/03/2012)  . ARF (acute renal failure) ([place])   . GIB (gastrointestinal bleeding)   . Chronic atrial fibrillation ([place])   . Non-sustained ventricular tachycardia ([place])   . Chronic anticoagulation   . Coronary artery disease 05/2000    s/p CABG with LIMA to LAD, SVG to IM, SVG to OM  . Mitral valvular regurgitation     s/p MVR with mechanical valve  . Ischemic dilated cardiomyopathy     EF 25-30% by echo 01/2012 at time of colon surgery  . Hyperlipidemia   . Stroke [facility])     after CABG  . Carcinoma of colon ([place])   . Basal cell carcinoma of shoulder 10/2010   Assessment: 37 yof presented to the hospital with diarrhea and found to have CDiff. Recently completed a prolonged course of IV antibiotics. He is chronically anticoagulated with warfarin for history of afib and a mechanical valve. INR is supratherapeutic at 4.1 today.  Unclear  what caused the jump in INR.   PTA warfarin: 2.5 mg po daily  Goal of Therapy:  INR 2.5-3.5 Monitor platelets by anticoagulation protocol: Yes   Plan:  - Hold warfarin - Daily INR  Kesia Dalto, Jake Church 07/30/2015,11:11 AM

## 2015-07-31 DIAGNOSIS — I482 Chronic atrial fibrillation: Secondary | ICD-10-CM

## 2015-07-31 DIAGNOSIS — A047 Enterocolitis due to Clostridium difficile: Principal | ICD-10-CM

## 2015-07-31 LAB — BASIC METABOLIC PANEL
Anion gap: 5 (ref 5–15)
BUN: 19 mg/dL (ref 6–20)
CHLORIDE: 109 mmol/L (ref 101–111)
CO2: 24 mmol/L (ref 22–32)
Calcium: 8.7 mg/dL — ABNORMAL LOW (ref 8.9–10.3)
Creatinine, Ser: 1.36 mg/dL — ABNORMAL HIGH (ref 0.61–1.24)
GFR calc Af Amer: 52 mL/min — ABNORMAL LOW (ref >60)
GFR calc non Af Amer: 44 mL/min — ABNORMAL LOW (ref >60)
GLUCOSE: 109 mg/dL — AB (ref 65–99)
POTASSIUM: 3.5 mmol/L (ref 3.5–5.1)
Sodium: 138 mmol/L (ref 135–145)

## 2015-07-31 LAB — CBC
HCT: 33.9 % — ABNORMAL LOW (ref 39.0–52.0)
HEMOGLOBIN: 11 g/dL — AB (ref 13.0–17.0)
MCH: 28.8 pg (ref 26.0–34.0)
MCHC: 32.4 g/dL (ref 30.0–36.0)
MCV: 88.7 fL (ref 78.0–100.0)
Platelets: 113 10*3/uL — ABNORMAL LOW (ref 150–400)
RBC: 3.82 MIL/uL — AB (ref 4.22–5.81)
RDW: 14.4 % (ref 11.5–15.5)
WBC: 6.6 10*3/uL (ref 4.0–10.5)

## 2015-07-31 LAB — PROTIME-INR
INR: 3.7 — ABNORMAL HIGH (ref 0.00–1.49)
Prothrombin Time: 35.9 seconds — ABNORMAL HIGH (ref 11.6–15.2)

## 2015-07-31 NOTE — Progress Notes (Signed)
Physical Therapy Treatment Patient Details Name: Alfred Dennis MRN: [id number] DOB: 02-Jan-1924 Today's Date: 18-Aug-2015    History of Present Illness Pt adm with c-diff. PMH - ischemic cardiomyopathy EF 25-35 percent, hypertension, mitral valve replacement, Persistent atrial fibrillation, colon cancer status post right hemicolectomy.    PT Comments    Pt making steady progress.  Follow Up Recommendations  Home health PT;Supervision for mobility/OOB     Equipment Recommendations  None recommended by PT    Recommendations for Other Services       Precautions / Restrictions Precautions Precautions: Fall Restrictions Weight Bearing Restrictions: No    Mobility  Bed Mobility Overal bed mobility: Needs Assistance Bed Mobility: Supine to Sit;Sit to Supine     Supine to sit: Mod assist     General bed mobility comments: Assist to elevate trunk into sitting  Transfers Overall transfer level: Needs assistance Equipment used: Rolling walker (2 wheeled) Transfers: Sit to/from Stand Sit to Stand: Min guard         General transfer comment: Assist for safety  Ambulation/Gait Ambulation/Gait assistance: Supervision Ambulation Distance (Feet): 250 Feet Assistive device: Rolling walker (2 wheeled) Gait Pattern/deviations: Step-through pattern;Decreased stride length     General Gait Details: Steady gait with walker   Stairs            Wheelchair Mobility    Modified Rankin (Stroke Patients Only)       Balance Overall balance assessment: Needs assistance Sitting-balance support: No upper extremity supported;Feet supported Sitting balance-Leahy Scale: Good     Standing balance support: No upper extremity supported Standing balance-Leahy Scale: Fair                      Cognition Arousal/Alertness: Awake/alert Behavior During Therapy: WFL for tasks assessed/performed Overall Cognitive Status: Within Functional Limits for tasks assessed                       Exercises      General Comments        Pertinent Vitals/Pain Pain Assessment: No/denies pain    Home Living                      Prior Function            PT Goals (current goals can now be found in the care plan section) Acute Rehab PT Goals Patient Stated Goal: Return home Progress towards PT goals: Progressing toward goals    Frequency  Min 3X/week    PT Plan Current plan remains appropriate    Co-evaluation             End of Session Equipment Utilized During Treatment: Gait belt Activity Tolerance: Patient tolerated treatment well Patient left: in chair;with call bell/phone within reach;with chair alarm set     Time: 1124-1140 PT Time Calculation (min) (ACUTE ONLY): 16 min  Charges:  $Gait Training: 8-22 mins                    G Codes:      Preciliano Castell 08/18/2015, 11:51 AM Suanne Marker PT [phone]

## 2015-07-31 NOTE — Progress Notes (Signed)
[place] for warfarin Indication: atrial fibrillation and hx mech valve  Allergies  Allergen Reactions  . Tape Other (See Comments)    CERTAIN BANDAGES CAN TEAR THE SKIN; PLEASE USE PAPER TAPE    Patient Measurements: Height: 5\' 7"  (170.2 cm) Weight: 140 lb 3.2 oz (63.594 kg) IBW/kg (Calculated) : 66.1  Vital Signs: Temp: 97.9 F (36.6 C) (05/24 0450) Temp Source: Oral (05/24 0450) BP: 133/71 mmHg (05/24 0450) Pulse Rate: 84 (05/24 0450)  Labs:  Recent Labs  07/29/15 0318 07/30/15 0219 07/31/15 0330  HGB 11.3* 11.1* 11.0*  HCT 35.4* 34.6* 33.9*  PLT 89* 95* 113*  LABPROT 37.1* 39.0* 35.9*  INR 3.88* 4.14* 3.70*  CREATININE 1.40* 1.24 1.36*    Estimated Creatinine Clearance: 33.1 mL/min (by C-G formula based on Cr of 1.36).   Medical History: Past Medical History  Diagnosis Date  . Hypertension   . Shortness of breath     "w/any activity lately" (02/03/2012)  . CHF (congestive heart failure) ([place])     mild/note 02/03/2012  . Arthritis     "left foot; fingers" (02/03/2012)  . ARF (acute renal failure) ([place])   . GIB (gastrointestinal bleeding)   . Chronic atrial fibrillation ([place])   . Non-sustained ventricular tachycardia ([place])   . Chronic anticoagulation   . Coronary artery disease 05/2000    s/p CABG with LIMA to LAD, SVG to IM, SVG to OM  . Mitral valvular regurgitation     s/p MVR with mechanical valve  . Ischemic dilated cardiomyopathy     EF 25-30% by echo 01/2012 at time of colon surgery  . Hyperlipidemia   . Stroke [facility])     after CABG  . Carcinoma of colon ([place])   . Basal cell carcinoma of shoulder 10/2010   Assessment: 48 yof presented to the hospital with diarrhea and found to have CDiff. Recently completed a prolonged course of IV antibiotics. He is chronically anticoagulated with warfarin for history of afib and a mechanical valve. INR is supratherapeutic at 3.7, but is trending down.  Unclear what  caused the jump in INR.   PTA warfarin: 2.5 mg po daily  Goal of Therapy:  INR 2.5-3.5 Monitor platelets by anticoagulation protocol: Yes   Plan:  - Hold warfarin - Daily INR  Harvel Quale 07/31/2015,1:36 PM

## 2015-07-31 NOTE — Progress Notes (Signed)
   07/31/15 0202  Vitals  Temp 98.2 F (36.8 C)  Temp Source Oral  BP (!) 118/55 mmHg  BP Location Left Arm  BP Method Automatic  Patient Position (if appropriate) Lying  Pulse Rate (!) 107  Pulse Rate Source Dinamap  Resp 16  Oxygen Therapy  SpO2 98 %  O2 Device Room Air  Pain Assessment  Pain Assessment No/denies pain   Pt experienced Vtach 6 beats at this time.  Notified Raliegh Ip Schorr NP.  Awaiting orders.  Pt resting comfortably.  Iantha Fallen RN 2:11 AM  07/31/2015

## 2015-07-31 NOTE — Progress Notes (Signed)
[facility] PROGRESS NOTE  Alfred Dennis [id number] DOB: [birth year] DOA: 07/28/2015  PCP: Osborne Casco, MD  Brief HPI: 82 year old male with a past medical history of chronic atrial fibrillation, congestive heart failure, and reactive disease, hyperlipidemia, who was recently hospitalized for sepsis due to group B streptococcus bacteremia and cellulitis. He was given IV ceftriaxone for 6 weeks. He presented with the diarrhea. Found to have C. difficile and was hospitalized for further management.  Past medical history:  Past Medical History  Diagnosis Date  . Hypertension   . Shortness of breath     "w/any activity lately" (02/03/2012)  . CHF (congestive heart failure) ([place])     mild/note 02/03/2012  . Arthritis     "left foot; fingers" (02/03/2012)  . ARF (acute renal failure) ([place])   . GIB (gastrointestinal bleeding)   . Chronic atrial fibrillation ([place])   . Non-sustained ventricular tachycardia ([place])   . Chronic anticoagulation   . Coronary artery disease 05/2000    s/p CABG with LIMA to LAD, SVG to IM, SVG to OM  . Mitral valvular regurgitation     s/p MVR with mechanical valve  . Ischemic dilated cardiomyopathy     EF 25-30% by echo 01/2012 at time of colon surgery  . Hyperlipidemia   . Stroke [facility])     after CABG  . Carcinoma of colon ([place])   . Basal cell carcinoma of shoulder 10/2010    Consultants: None  Procedures: None  Antibiotics: Oral vancomycin  Subjective: Patient feels well this morning. Still with diarrhea, does not want to go home yet.   Objective:  Vital Signs  Filed Vitals:   07/30/15 1953 07/31/15 0202 07/31/15 0450 07/31/15 1410  BP: 102/59 118/55 133/71 113/63  Pulse: 73 107 84 70  Temp: 97.7 F (36.5 C) 98.2 F (36.8 C) 97.9 F (36.6 C) 97.7 F (36.5 C)  TempSrc: Oral Oral Oral Oral  Resp: 20 16 15    Height:      Weight:   63.594 kg (140 lb 3.2 oz)   SpO2: 100% 98% 98% 99%    Intake/Output Summary (Last  24 hours) at 07/31/15 1420 Last data filed at 07/31/15 1111  Gross per 24 hour  Intake      3 ml  Output   1400 ml  Net  -1397 ml   Filed Weights   07/29/15 0615 07/30/15 0442 07/31/15 0450  Weight: 63.005 kg (138 lb 14.4 oz) 64.683 kg (142 lb 9.6 oz) 63.594 kg (140 lb 3.2 oz)    General appearance: alert, cooperative, appears stated age and no distress Resp: clear to auscultation bilaterally Cardio: regular rate and rhythm, S1, S2 normal, no murmur, click, rub or gallop GI: soft, non-tender; bowel sounds normal; no masses,  no organomegaly. Extremities: extremities normal, atraumatic, no cyanosis or edema Neurologic: Awake and alert. Oriented 3. No focal neurological deficits.  Lab Results:  Data Reviewed: I have personally reviewed following labs and imaging studies  CBC:  Recent Labs Lab 07/28/15 0619 07/29/15 0318 07/30/15 0219 07/31/15 0330  WBC 11.4* 9.6 7.5 6.6  NEUTROABS 9.9*  --   --   --   HGB 12.4* 11.3* 11.1* 11.0*  HCT 38.8* 35.4* 34.6* 33.9*  MCV 91.7 90.1 89.4 88.7  PLT 100* 89* 95* [id number]*   Basic Metabolic Panel:  Recent Labs Lab 07/28/15 0619 07/29/15 0318 07/30/15 0219 07/31/15 0330  NA 139 140 137 138  K 3.7 2.9* 3.6 3.5  CL 103 112* 111  109  CO2 23 22 22 24   GLUCOSE 123* 91 83 109*  BUN 25* 19 16 19   CREATININE 1.90* 1.40* 1.24 1.36*  CALCIUM 9.1 8.5* 8.5* 8.7*   Liver Function Tests:  Recent Labs Lab 07/28/15 0619 07/29/15 0318  AST 40 37  ALT 29 33  ALKPHOS 47 44  BILITOT 1.9* 1.1  PROT 6.5 5.0*  ALBUMIN 3.7 2.8*   Coagulation Profile:  Recent Labs Lab 07/28/15 0619 07/29/15 0318 07/30/15 0219 07/31/15 0330  INR 2.76* 3.88* 4.14* 3.70*   Cardiac Enzymes:  Recent Labs Lab 07/28/15 0619  TROPONINI 0.08*   Urine analysis:    Component Value Date/Time   COLORURINE YELLOW 07/28/2015 [place] 07/28/2015 0733   LABSPEC 1.020 07/28/2015 0733   PHURINE 5.0 07/28/2015 [place]  07/28/2015 0733   HGBUR TRACE* 07/28/2015 0733   BILIRUBINUR NEGATIVE 07/28/2015 [place] 07/28/2015 0733   PROTEINUR 30* 07/28/2015 0733   UROBILINOGEN 1.0 03/09/2014 1947   NITRITE NEGATIVE 07/28/2015 0733   LEUKOCYTESUR NEGATIVE 07/28/2015 0733    Recent Results (from the past 240 hour(s))  C difficile quick scan w PCR reflex     Status: Abnormal   Collection Time: 07/28/15  8:35 AM  Result Value Ref Range Status   C Diff antigen POSITIVE (A) NEGATIVE Final   C Diff toxin POSITIVE (A) NEGATIVE Final   C Diff interpretation Positive for toxigenic C. difficile  Final    Comment: CRITICAL RESULT CALLED TO, READ BACK BY AND VERIFIED WITH: M.BEASLEY RN AT [id number] 07/28/15 BY A.DAVIS       Radiology Studies: No results found.   Medications:  Scheduled: . carvedilol  3.125 mg Oral BID WC  . furosemide  20 mg Oral BID  . mupirocin cream   Topical BID  . potassium chloride  10 mEq Oral Daily  . simvastatin  10 mg Oral QHS  . sodium chloride flush  3 mL Intravenous Q12H  . vancomycin  125 mg Oral QID  . Warfarin - Pharmacist Dosing Inpatient   Does not apply q1800   Continuous:  [id number] **OR** acetaminophen, bisacodyl, HYDROcodone-acetaminophen, magnesium citrate, morphine injection, ondansetron **OR** ondansetron (ZOFRAN) IV, senna-docusate, traZODone  Assessment/Plan:  C Diff colitis Patient seems to be slowly improving but still with several episodes this AM.  Continue oral vancomycin for now. Start mobilizing.  Diet advanced.   Chronic systolic and diastolic heart failure Last echocardiogram 06/07/2015 echo EF 20-25%, diffuse hypokinesis, PAP 55.  Continue diuretics. Daily weights and strict ins and outs.  Appears to be reasonably well compensated at this time. No pulmonary edema noted on chest x-ray.  Atrial Fibrillation /s/p MVR Appears to be stable. Coumadin per pharmacy  Continue home medications. Continue with beta blocker.   Acute on  Chronic kidney disease stage IIIB Baseline creatinine 1.3. Creatinine was noted to be 1.9 at admission.  Cr currently at baseline. BMP in AM  Hyperlipidemia Continue Zocor  Elevated troponin Patient denies any chest pain. Slight elevation in troponin is likely due to his renal disease. EKG did not show any ischemic changes. Patient has known LBBB.  Thrombocytopenia  Likely reactive, no signs of bleeding CBC in AM  Deconditioning Resume HH PT/OT  Left-sided inguinal hernia Hernia is easily reducible. He is asymptomatic.  DVT Prophylaxis: Warfarin  Code Status: Full code  Family Communication: Discussed with the patient and his daughter at bedside.  Disposition Plan: Does not want to go home today  due to persistent diarrhea.    LOS: 3 days   Faye Ramsay  [facility] Pager [phone] 07/31/2015, 2:20 PM  If 7PM-7AM, please contact night-coverage at [email or web address], password [facility]

## 2015-08-01 ENCOUNTER — Encounter: Payer: Self-pay | Admitting: Cardiology

## 2015-08-01 DIAGNOSIS — N179 Acute kidney failure, unspecified: Secondary | ICD-10-CM

## 2015-08-01 LAB — PROTIME-INR
INR: 2.4 — ABNORMAL HIGH (ref 0.00–1.49)
Prothrombin Time: 25.9 seconds — ABNORMAL HIGH (ref 11.6–15.2)

## 2015-08-01 LAB — BASIC METABOLIC PANEL
ANION GAP: 7 (ref 5–15)
BUN: 18 mg/dL (ref 6–20)
CALCIUM: 8.8 mg/dL — AB (ref 8.9–10.3)
CO2: 25 mmol/L (ref 22–32)
Chloride: 109 mmol/L (ref 101–111)
Creatinine, Ser: 1.18 mg/dL (ref 0.61–1.24)
GFR, EST NON AFRICAN AMERICAN: 53 mL/min — AB (ref >60)
GLUCOSE: 98 mg/dL (ref 65–99)
POTASSIUM: 3.5 mmol/L (ref 3.5–5.1)
Sodium: 141 mmol/L (ref 135–145)

## 2015-08-01 LAB — CBC
HCT: 36.6 % — ABNORMAL LOW (ref 39.0–52.0)
Hemoglobin: 11.7 g/dL — ABNORMAL LOW (ref 13.0–17.0)
MCH: 29.1 pg (ref 26.0–34.0)
MCHC: 32 g/dL (ref 30.0–36.0)
MCV: 91 fL (ref 78.0–100.0)
PLATELETS: 123 10*3/uL — AB (ref 150–400)
RBC: 4.02 MIL/uL — ABNORMAL LOW (ref 4.22–5.81)
RDW: 14.3 % (ref 11.5–15.5)
WBC: 7.1 10*3/uL (ref 4.0–10.5)

## 2015-08-01 MED ORDER — VANCOMYCIN 50 MG/ML ORAL SOLUTION: 125.0000 mg | Freq: Four times a day (QID) | ORAL | Status: DC

## 2015-08-01 MED ORDER — TRAZODONE HCL 50 MG PO TABS: 25.0000 mg | ORAL_TABLET | Freq: Every evening | ORAL | Status: DC | PRN

## 2015-08-01 MED ORDER — HYDROCODONE-ACETAMINOPHEN 5-325 MG PO TABS: 1.0000 | ORAL_TABLET | ORAL | Status: DC | PRN

## 2015-08-01 MED ORDER — SACCHAROMYCES BOULARDII 250 MG PO CAPS: 250.0000 mg | ORAL_CAPSULE | Freq: Two times a day (BID) | ORAL | Status: DC

## 2015-08-01 MED ORDER — BOOST / RESOURCE BREEZE PO LIQD: 1.0000 | Freq: Two times a day (BID) | ORAL | Status: DC

## 2015-08-01 MED ORDER — WARFARIN SODIUM 2.5 MG PO TABS: 2.5000 mg | ORAL_TABLET | Freq: Once | ORAL | Status: DC

## 2015-08-01 MED ORDER — ENOXAPARIN SODIUM 100 MG/ML ~~LOC~~ SOLN: 90.0000 mg | Freq: Once | SUBCUTANEOUS | Status: DC

## 2015-08-01 NOTE — Progress Notes (Addendum)
[place] for warfarin Indication: atrial fibrillation and hx mech valve  Allergies  Allergen Reactions  . Tape Other (See Comments)    CERTAIN BANDAGES CAN TEAR THE SKIN; PLEASE USE PAPER TAPE    Patient Measurements: Height: 5\' 7"  (170.2 cm) Weight: 136 lb 0.4 oz (61.7 kg) IBW/kg (Calculated) : 66.1  Vital Signs: Temp: 97.6 F (36.4 C) (05/25 0508) Temp Source: Oral (05/25 0508) BP: 139/58 mmHg (05/25 0508) Pulse Rate: 79 (05/25 0508)  Labs:  Recent Labs  07/30/15 0219 07/31/15 0330 08/01/15 0317  HGB 11.1* 11.0* 11.7*  HCT 34.6* 33.9* 36.6*  PLT 95* 113* 123*  LABPROT 39.0* 35.9* 25.9*  INR 4.14* 3.70* 2.40*  CREATININE 1.24 1.36* 1.18    Estimated Creatinine Clearance: 37 mL/min (by C-G formula based on Cr of 1.18).   Medical History: Past Medical History  Diagnosis Date  . Hypertension   . Shortness of breath     "w/any activity lately" (02/03/2012)  . CHF (congestive heart failure) ([place])     mild/note 02/03/2012  . Arthritis     "left foot; fingers" (02/03/2012)  . ARF (acute renal failure) ([place])   . GIB (gastrointestinal bleeding)   . Chronic atrial fibrillation ([place])   . Non-sustained ventricular tachycardia ([place])   . Chronic anticoagulation   . Coronary artery disease 05/2000    s/p CABG with LIMA to LAD, SVG to IM, SVG to OM  . Mitral valvular regurgitation     s/p MVR with mechanical valve  . Ischemic dilated cardiomyopathy     EF 25-30% by echo 01/2012 at time of colon surgery  . Hyperlipidemia   . Stroke [facility])     after CABG  . Carcinoma of colon ([place])   . Basal cell carcinoma of shoulder 10/2010   Assessment: 29 yof presented to the hospital with diarrhea and found to have CDiff. Recently completed a prolonged course of IV antibiotics. He is chronically anticoagulated with warfarin for history of afib and a mechanical valve. INR is slightly subtherapeutic at 2.4 (for goal 2.5-3.5).   PTA warfarin:  2.5 mg po daily  Goal of Therapy:  INR 2.5-3.5 Monitor platelets by anticoagulation protocol: Yes   Plan:  - Warfarin 2.5 mg po x1 - Daily INR  Alfred Dennis, Alfred Dennis 08/01/2015,9:13 AM   Addendum -Discussed with MD, will give patient a one time dose of Lovenox since patient's INR fell today -Lovenox 1.5 mg/kg x1 -Continue warfarin  Harvel Quale  08/01/2015.10:29 AM

## 2015-08-01 NOTE — Care Management Note (Signed)
Case Management Note Marvetta Gibbons RN, BSN Unit 2W-Case Manager [phone]  Patient Details  Name: Alfred Dennis MRN: [id number] Date of Birth: [birth year]  Subjective/Objective:   Pt admitted with +cdiff                 Action/Plan: PTA pt lived at home with wife/daughter- pt uses cane at home- per daughter pt was active with [facility] for home IV abx (last dose 5/19)-  CM to follow for any d/c needs.   Expected Discharge Date:  08/01/15               Expected Discharge Plan:  [place]  In-House Referral:     Discharge planning Services  CM Consult  Post Acute Care Choice:  Home Health, Resumption of Svcs/PTA Provider Choice offered to:  Adult Children, Patient  DME Arranged:    DME Agency:     HH Arranged:  RN, PT, OT [place] Agency:  [facility]  Status of Service:  Completed, signed off  Medicare Important Message Given:  Yes Date Medicare IM Given:    Medicare IM give by:    Date Additional Medicare IM Given:    Additional Medicare Important Message give by:     If discussed at [place] of Stay Meetings, dates discussed:    Additional Comments:  08/01/15- Marvetta Gibbons RN, BSN- pt for d/c home today- [facility] orders resumed for HHRN/PT/OT- spoke with pt at bedside- per pt he is not sure he wants to continue PT/OT- will let them know when they call- per pt wants to continue with [facility] and same [facility] nurse. Call made to [place] with [facility] regarding resumption of [place] services.   Dawayne Patricia, RN 08/01/2015, 2:02 PM

## 2015-08-01 NOTE — Discharge Summary (Signed)
Physician Discharge Summary  Alfred Dennis [id number] DOB: 1923/12/15 DOA: 07/28/2015  PCP: Alfred Casco, MD  Admit date: 07/28/2015 Discharge date: 08/01/2015  Recommendations for Outpatient Follow-up:  1. Pt will need to follow up with PCP in 1-2 weeks post discharge 2. Please obtain BMP to evaluate electrolytes and kidney function 3. Please also check CBC to evaluate Hg and Hct levels 4. Pt to continue taking oral vancomycin as outlined below 5. Pt also advised to stay on probiotic as long as he is on oral vancomycin  6. Daughter updated at length at bedside and my phone number provided for further questions if needed   Discharge Diagnoses:  Active Problems:   Chronic a-fib (HCC)   HTN (hypertension)   S/P MVR (mitral valve replacement)-St Jude 2002   GIB (gastrointestinal bleeding)   S/P CABG x 3 [id number]   Chronic systolic heart failure ([place])  Discharge Condition: Stable  Diet recommendation: Heart healthy diet discussed in details   History of present illness:  82 year old male with a past medical history of chronic atrial fibrillation, congestive heart failure, and reactive disease, hyperlipidemia, who was recently hospitalized for sepsis due to group B streptococcus bacteremia and cellulitis. He was given IV ceftriaxone for 6 weeks. He presented with the diarrhea. Found to have C. difficile and was hospitalized for further management.  Assessment and plan: C Diff colitis Patient seems to be slowly improving, less diarrhea in the past 24 hours  Continue oral vancomycin   Diet advanced and pt tolerating well   Chronic systolic and diastolic heart failure Last echocardiogram 06/07/2015 echo EF 20-25%, diffuse hypokinesis, PAP 55.  Continue diuretics.  Appears to be reasonably well compensated at this time.  Atrial Fibrillation /s/p MVR Appears to be stable. Coumadin can be continued upon discharge  HHRN to be set up to help pt with PT/INR checks, my  number provided as well the number for PCP office to call results of PT/INR in so that we can readjust the dose of Coumadin as indicated   Acute on Chronic kidney disease stage IIIB Baseline creatinine 1.3. Creatinine was noted to be 1.9 at admission.   Hyperlipidemia Continue Zocor  Elevated troponin Patient denies any chest pain. Slight elevation in troponin is likely due to his renal disease.   Thrombocytopenia  Likely reactive, no signs of bleeding  Deconditioning Resume HH PT/OT  Left-sided inguinal hernia Hernia is easily reducible. He is asymptomatic.  DVT Prophylaxis: Warfarin  Code Status: Full code  Family Communication: Discussed with the patient and his daughter at bedside Disposition Plan: Home  Discharge Exam: Filed Vitals:   07/31/15 2019 08/01/15 0508  BP: 113/58 139/58  Pulse: 62 79  Temp: 97.9 F (36.6 C) 97.6 F (36.4 C)  Resp: 16 16   Filed Vitals:   07/31/15 0450 07/31/15 1410 07/31/15 2019 08/01/15 0508  BP: 133/71 113/63 113/58 139/58  Pulse: 84 70 62 79  Temp: 97.9 F (36.6 C) 97.7 F (36.5 C) 97.9 F (36.6 C) 97.6 F (36.4 C)  TempSrc: Oral Oral Oral Oral  Resp: 15  16 16   Height:      Weight: 63.594 kg (140 lb 3.2 oz)   61.7 kg (136 lb 0.4 oz)  SpO2: 98% 99% 98% 99%    General: Pt is alert, follows commands appropriately, not in acute distress Cardiovascular: Regular rate and rhythm, no rubs, no gallops Respiratory: Clear to auscultation bilaterally, no wheezing, no crackles, no rhonchi Abdominal: Soft, non tender, non distended, bowel sounds +,  no guarding   Discharge Instructions     Medication List    STOP taking these medications        acetaminophen 325 MG tablet  Commonly known as:  TYLENOL     cefTRIAXone 2 g in dextrose 5 % 50 mL      TAKE these medications        carvedilol 3.125 MG tablet  Commonly known as:  COREG  Take 1 tablet (3.125 mg total) by mouth 2 (two) times daily with a meal.     furosemide  40 MG tablet  Commonly known as:  LASIX  Take 1 tablet (40 mg total) by mouth 2 (two) times daily.     HYDROcodone-acetaminophen 5-325 MG tablet  Commonly known as:  NORCO/VICODIN  Take 1-2 tablets by mouth every 4 (four) hours as needed for moderate pain.     multivitamin with minerals Tabs tablet  Take 1 tablet by mouth daily.     potassium chloride 10 MEQ tablet  Commonly known as:  K-DUR  Take 1 tablet (10 mEq total) by mouth daily.     simvastatin 10 MG tablet  Commonly known as:  ZOCOR  Take 10 mg by mouth at bedtime.     traZODone 50 MG tablet  Commonly known as:  DESYREL  Take 0.5 tablets (25 mg total) by mouth at bedtime as needed for sleep.     vancomycin 50 mg/mL oral solution  Commonly known as:  VANCOCIN  Take 2.5 mLs (125 mg total) by mouth 4 (four) times daily. Take until June 4 th, 2017     warfarin 5 MG tablet  Commonly known as:  COUMADIN  Take 2.5 mg by mouth daily. Sun/Mon/Tues/Wed/Thurs/Fri/Sat (evenings at 1730)           Follow-up Information    Follow up with Alfred Casco, MD.   Specialty:  Family Medicine   Contact information:   [address] [place] [address] [phone]       Call Alfred Ramsay, MD.   Specialty:  Internal Medicine   Why:  As needed call my cell [phone]   Contact information:   [address] [place] [address] [phone]        The results of significant diagnostics from this hospitalization (including imaging, microbiology, ancillary and laboratory) are listed below for reference.     Microbiology: Recent Results (from the past 240 hour(s))  C difficile quick scan w PCR reflex     Status: Abnormal   Collection Time: 07/28/15  8:35 AM  Result Value Ref Range Status   C Diff antigen POSITIVE (A) NEGATIVE Final   C Diff toxin POSITIVE (A) NEGATIVE Final   C Diff interpretation Positive for toxigenic C. difficile  Final    Comment: CRITICAL RESULT  CALLED TO, READ BACK BY AND VERIFIED WITH: M.BEASLEY RN AT 0926 07/28/15 BY A.DAVIS      Labs: Basic Metabolic Panel:  Recent Labs Lab 07/28/15 0619 07/29/15 0318 07/30/15 0219 07/31/15 0330 08/01/15 0317  NA 139 140 137 138 141  K 3.7 2.9* 3.6 3.5 3.5  CL 103 112* 111 109 109  CO2 23 22 22 24 25   GLUCOSE 123* 91 83 109* 98  BUN 25* 19 16 19 18   CREATININE 1.90* 1.40* 1.24 1.36* 1.18  CALCIUM 9.1 8.5* 8.5* 8.7* 8.8*   Liver Function Tests:  Recent Labs Lab 07/28/15 0619 07/29/15 0318  AST 40 37  ALT 29  33  ALKPHOS 47 44  BILITOT 1.9* 1.1  PROT 6.5 5.0*  ALBUMIN 3.7 2.8*   No results for input(s): LIPASE, AMYLASE in the last 168 hours. No results for input(s): AMMONIA in the last 168 hours. CBC:  Recent Labs Lab 07/28/15 0619 07/29/15 0318 07/30/15 0219 07/31/15 0330 08/01/15 0317  WBC 11.4* 9.6 7.5 6.6 7.1  NEUTROABS 9.9*  --   --   --   --   HGB 12.4* 11.3* 11.1* 11.0* 11.7*  HCT 38.8* 35.4* 34.6* 33.9* 36.6*  MCV 91.7 90.1 89.4 88.7 91.0  PLT 100* 89* 95* 113* 123*   Cardiac Enzymes:  Recent Labs Lab 07/28/15 0619  TROPONINI 0.08*   BNP: BNP (last 3 results)  Recent Labs  06/05/15 1959  BNP 1585.8*    ProBNP (last 3 results) No results for input(s): PROBNP in the last 8760 hours.  CBG: No results for input(s): GLUCAP in the last 168 hours.   SIGNED: Time coordinating discharge: Over 30 minutes  Alfred Ramsay, MD  [facility] 08/01/2015, 10:44 AM Pager [phone]  If 7PM-7AM, please contact night-coverage [email or web address] Password [email or web address]

## 2015-08-01 NOTE — Discharge Instructions (Signed)
Clostridium Difficile Infection Clostridium difficile (C. difficile or C. diff) is a bacterium normally found in the intestinal tract or colon. C. difficile infection causes diarrhea and sometimes a severe disease called pseudomembranous colitis (C. difficile colitis). C. difficile colitis can damage the lining of the colon or cause the colon to become very large (toxic megacolon). Older adults and people with certain medical conditions have a greater risk of getting C. difficile infections. CAUSES The balance of bacteria in your colon can change when you are sick, especially when taking antibiotic medicine. Taking antibiotics may allow the C. difficile to grow, multiply, and make a toxin that causes C. difficile infection.  SYMPTOMS  Diarrhea.  Fever.  Fatigue.  Loss of appetite.  Nausea.  Abdominal swelling, pain, or tenderness.  Dehydration. DIAGNOSIS Your health care provider may suspect C. difficile infection based on your symptoms and if you have taken antibiotics recently. Your health care provider may also order:  A lab test that can detect the toxin in your stool.  A sigmoidoscopy or colonoscopy to look at the appearance of your colon. These procedures involve passing an instrument through your rectum to look at the inside of your colon. Your health care provider will help determine if these tests are necessary. TREATMENT Treatment may include:  Taking antibiotics that keep C. difficile from growing.  Stopping the antibiotics you were on before the C. difficile infection began. Only do this if instructed to do so by your health care provider.  IV fluids and correction of electrolyte imbalance.  Surgery to remove the infected part of the intestines. This is rare. HOME CARE INSTRUCTIONS  Drink enough fluids to keep your urine clear or pale yellow. Avoid milk, caffeine, and alcohol.  Ask your health care provider for specific rehydration instructions.  Eat small,  frequent meals rather than large meals.  Take your antibiotics as directed. Finish them even if you start to feel better.  Do not use medicines to slow diarrhea. This could delay healing or cause problems.  Wash your hands thoroughly after using the bathroom and before preparing food. Make sure people who live with you wash their hands often, too.  Clean all surfaces with a product that contains chlorine bleach. SEEK MEDICAL CARE IF:  Your diarrhea lasts longer than expected or comes back after you finish your antibiotic medicine for the C. difficile infection.  You have trouble staying hydrated.  You have a fever. SEEK IMMEDIATE MEDICAL CARE IF:  You have increasing abdominal pain or tenderness.  You have blood in your stools, or your stools look dark black and tarry.  You cannot eat or drink without vomiting.   This information is not intended to replace advice given to you by your health care provider. Make sure you discuss any questions you have with your health care provider.   Document Released: 12/03/2004 Document Revised: 03/16/2014 Document Reviewed: 08/27/2014 Elsevier Interactive Patient Education 2016 [employer] Inc.  

## 2015-08-01 NOTE — Care Management Important Message (Signed)
Important Message  Patient Details  Name: Alfred Dennis MRN: [id number] Date of Birth: 1923/06/30   Medicare Important Message Given:  Yes    Dawayne Patricia, RN 08/01/2015, 12:30 PM

## 2015-08-01 NOTE — Progress Notes (Signed)
Initial Nutrition Assessment  DOCUMENTATION CODES:   Not applicable  INTERVENTION:   Boost Breeze po BID, each supplement provides 250 kcal and 9 grams of protein  NUTRITION DIAGNOSIS:   Increased nutrient needs related to acute illness as evidenced by estimated needs  GOAL:   Patient will meet greater than or equal to 90% of their needs  MONITOR:   PO intake, Supplement acceptance, Labs, Weight trends, I & O's  REASON FOR ASSESSMENT:   Consult Assessment of nutrition requirement/status  ASSESSMENT:   82 yo Male with a PMH of chronic atrial fibrillation, congestive heart failure, and reactive disease, hyperlipidemia, who was recently hospitalized for sepsis due to group B streptococcus bacteremia and cellulitis. He was given IV ceftriaxone for 6 weeks. He presented with the diarrhea. Found to have C. difficile and was hospitalized for further management.  Patient sleeping upon RD visit >> unable to wake. PO intake 75-100% per flowsheet records. Would benefit from oral nutrition supplements >> will order. Unable to complete Nutrition Focused Physical Exam at this time. Suspect malnutrition, however, unable to identify at this time.  Diet Order:  Diet regular Room service appropriate?: Yes; Fluid consistency:: Thin  Skin:  Reviewed, no issues  Last BM:  5/24  Height:   Ht Readings from Last 1 Encounters:  07/28/15 5\' 7"  (1.702 m)    Weight:   Wt Readings from Last 1 Encounters:  08/01/15 136 lb 0.4 oz (61.7 kg)    Ideal Body Weight:  67.2 kg  BMI:  Body mass index is 21.3 kg/(m^2).  Estimated Nutritional Needs:   Kcal:  1500-1700  Protein:  75-85 gm  Fluid:  1.5-1.7 L  EDUCATION NEEDS:   No education needs identified at this time  Arthur Holms, RD, LDN Pager #: [phone] After-Hours Pager #: [phone]

## 2015-08-01 NOTE — Progress Notes (Signed)
Occupational Therapy Treatment Patient Details Name: Alfred Dennis MRN: [id number] DOB: 07/17/1923 Today's Date: 08/01/2015    History of present illness Pt adm with c-diff. PMH - ischemic cardiomyopathy EF 25-35 percent, hypertension, mitral valve replacement, Persistent atrial fibrillation, colon cancer status post right hemicolectomy.   OT comments  Pt progressing steadily. Daughter present throughout session and appears very aware of pt's deficits and level of care he requires.  Follow Up Recommendations  Home health OT    Equipment Recommendations  None recommended by OT    Recommendations for Other Services      Precautions / Restrictions Precautions Precautions: Fall       Mobility Bed Mobility Overal bed mobility: Modified Independent Bed Mobility: Supine to Sit;Sit to Supine     Supine to sit: Min guard Sit to supine: Min guard   General bed mobility comments: no physical assist, increased time and effort  Transfers Overall transfer level: Needs assistance Equipment used: Rolling walker (2 wheeled) Transfers: Sit to/from Stand Sit to Stand: Min guard         General transfer comment: Assist for safety    Balance     Sitting balance-Leahy Scale: Good       Standing balance-Leahy Scale: Fair                     ADL Overall ADL's : Needs assistance/impaired     Grooming: Supervision/safety;Standing;Wash/dry hands (shaving)               Lower Body Dressing: Minimal assistance;Sit to/from stand Lower Body Dressing Details (indicate cue type and reason): doffed sock, min assist to [name] Toilet Transfer: Min guard;RW;Ambulation;Regular Toilet;Grab bars   Toileting- Clothing Manipulation and Hygiene: Min guard;Sit to/from Nurse, children's Details (indicate cue type and reason): pt not agreeable to a shower seat, has one in the home with grab bars and a hand held shower head, recommended supervision for transfer, but  daughter uncomfortable with assisting her dad in the shower Functional mobility during ADLs: Min guard;Rolling walker        Vision                     Perception     Praxis      Cognition   Behavior During Therapy: [facility] for tasks assessed/performed Overall Cognitive Status: Within Functional Limits for tasks assessed                       Extremity/Trunk Assessment               Exercises     Shoulder Instructions       General Comments      Pertinent Vitals/ Pain       Pain Assessment: No/denies pain  Home Living                                          Prior Functioning/Environment              Frequency Min 2X/week     Progress Toward Goals  OT Goals(current goals can now be found in the care plan section)  Progress towards OT goals: Progressing toward goals  Acute Rehab OT Goals Patient Stated Goal: Return home Time For Goal Achievement: 08/06/15 Potential to Achieve Goals: Good  Plan Discharge plan remains  appropriate    Co-evaluation                 End of Session Equipment Utilized During Treatment: Gait belt;Rolling walker   Activity Tolerance Patient tolerated treatment well   Patient Left in bed;with call bell/phone within reach;with bed alarm set;with family/visitor present   Nurse Communication          Time: [phone] OT Time Calculation (min): 32 min  Charges: OT General Charges $OT Visit: 1 Procedure OT Treatments $Self Care/Home Management : 23-37 mins  [name] 08/01/2015, 9:38 AM  [phone]

## 2015-08-02 ENCOUNTER — Telehealth: Payer: Self-pay | Admitting: Cardiology

## 2015-08-02 DIAGNOSIS — N183 Chronic kidney disease, stage 3 (moderate): Secondary | ICD-10-CM | POA: Diagnosis not present

## 2015-08-02 DIAGNOSIS — I2510 Atherosclerotic heart disease of native coronary artery without angina pectoris: Secondary | ICD-10-CM | POA: Diagnosis not present

## 2015-08-02 DIAGNOSIS — I129 Hypertensive chronic kidney disease with stage 1 through stage 4 chronic kidney disease, or unspecified chronic kidney disease: Secondary | ICD-10-CM | POA: Diagnosis not present

## 2015-08-02 DIAGNOSIS — I5021 Acute systolic (congestive) heart failure: Secondary | ICD-10-CM | POA: Diagnosis not present

## 2015-08-02 DIAGNOSIS — L03115 Cellulitis of right lower limb: Secondary | ICD-10-CM | POA: Diagnosis not present

## 2015-08-02 DIAGNOSIS — I255 Ischemic cardiomyopathy: Secondary | ICD-10-CM | POA: Diagnosis not present

## 2015-08-02 NOTE — Telephone Encounter (Signed)
Reviewed pt's hospital notes. He was admitted for C.Diff.  INR on admission was therapeutic.  He did get supratherapeutic in hospital and Coumadin was held for a few days.  INR yesterday was 2.4.  Plan to continue him on his previous dose of 2.5mg  daily and have RN recheck INR on 5/31.

## 2015-08-02 NOTE — Telephone Encounter (Signed)
[place]([facility]) is calling to get orders to check patient PT/INR . Patient was just discharged from hospital. FYI: She does have a confidential voicemail , Leave a message if she does not answer .Marland Kitchen Thanks

## 2015-08-06 ENCOUNTER — Encounter: Payer: Self-pay | Admitting: Cardiology

## 2015-08-07 ENCOUNTER — Ambulatory Visit (INDEPENDENT_AMBULATORY_CARE_PROVIDER_SITE_OTHER): Payer: Medicare Other | Admitting: Internal Medicine

## 2015-08-07 ENCOUNTER — Telehealth: Payer: Self-pay | Admitting: Cardiology

## 2015-08-07 DIAGNOSIS — L03115 Cellulitis of right lower limb: Secondary | ICD-10-CM | POA: Diagnosis not present

## 2015-08-07 DIAGNOSIS — I2510 Atherosclerotic heart disease of native coronary artery without angina pectoris: Secondary | ICD-10-CM | POA: Diagnosis not present

## 2015-08-07 DIAGNOSIS — I5021 Acute systolic (congestive) heart failure: Secondary | ICD-10-CM | POA: Diagnosis not present

## 2015-08-07 DIAGNOSIS — I059 Rheumatic mitral valve disease, unspecified: Secondary | ICD-10-CM

## 2015-08-07 DIAGNOSIS — I255 Ischemic cardiomyopathy: Secondary | ICD-10-CM | POA: Diagnosis not present

## 2015-08-07 DIAGNOSIS — I129 Hypertensive chronic kidney disease with stage 1 through stage 4 chronic kidney disease, or unspecified chronic kidney disease: Secondary | ICD-10-CM | POA: Diagnosis not present

## 2015-08-07 DIAGNOSIS — Z5181 Encounter for therapeutic drug level monitoring: Secondary | ICD-10-CM

## 2015-08-07 DIAGNOSIS — N183 Chronic kidney disease, stage 3 (moderate): Secondary | ICD-10-CM | POA: Diagnosis not present

## 2015-08-07 LAB — POCT INR: INR: 2.8

## 2015-08-07 NOTE — Telephone Encounter (Signed)
New message     The home health nurse to give a PT/INR result to the nurse, the home health nurse is there with the pt.

## 2015-08-07 NOTE — Telephone Encounter (Signed)
Please refer to Anticoagulation Encounter. Thanks.  

## 2015-08-08 ENCOUNTER — Encounter: Payer: Self-pay | Admitting: Nurse Practitioner

## 2015-08-08 ENCOUNTER — Ambulatory Visit (INDEPENDENT_AMBULATORY_CARE_PROVIDER_SITE_OTHER): Payer: Medicare Other | Admitting: Nurse Practitioner

## 2015-08-08 VITALS — BP 116/60 | HR 84 | Ht 67.0 in | Wt 130.6 lb

## 2015-08-08 DIAGNOSIS — R7881 Bacteremia: Secondary | ICD-10-CM | POA: Insufficient documentation

## 2015-08-08 DIAGNOSIS — Z952 Presence of prosthetic heart valve: Secondary | ICD-10-CM

## 2015-08-08 DIAGNOSIS — I472 Ventricular tachycardia, unspecified: Secondary | ICD-10-CM | POA: Insufficient documentation

## 2015-08-08 DIAGNOSIS — I482 Chronic atrial fibrillation, unspecified: Secondary | ICD-10-CM | POA: Insufficient documentation

## 2015-08-08 DIAGNOSIS — N183 Chronic kidney disease, stage 3 unspecified: Secondary | ICD-10-CM

## 2015-08-08 DIAGNOSIS — I255 Ischemic cardiomyopathy: Secondary | ICD-10-CM

## 2015-08-08 DIAGNOSIS — I110 Hypertensive heart disease with heart failure: Secondary | ICD-10-CM

## 2015-08-08 DIAGNOSIS — I340 Nonrheumatic mitral (valve) insufficiency: Secondary | ICD-10-CM | POA: Insufficient documentation

## 2015-08-08 DIAGNOSIS — Z954 Presence of other heart-valve replacement: Secondary | ICD-10-CM | POA: Diagnosis not present

## 2015-08-08 DIAGNOSIS — I2510 Atherosclerotic heart disease of native coronary artery without angina pectoris: Secondary | ICD-10-CM | POA: Insufficient documentation

## 2015-08-08 DIAGNOSIS — I5022 Chronic systolic (congestive) heart failure: Secondary | ICD-10-CM | POA: Diagnosis not present

## 2015-08-08 DIAGNOSIS — I119 Hypertensive heart disease without heart failure: Secondary | ICD-10-CM | POA: Insufficient documentation

## 2015-08-08 DIAGNOSIS — A0472 Enterocolitis due to Clostridium difficile, not specified as recurrent: Secondary | ICD-10-CM

## 2015-08-08 DIAGNOSIS — A047 Enterocolitis due to Clostridium difficile: Secondary | ICD-10-CM

## 2015-08-08 DIAGNOSIS — B951 Streptococcus, group B, as the cause of diseases classified elsewhere: Secondary | ICD-10-CM

## 2015-08-08 DIAGNOSIS — E785 Hyperlipidemia, unspecified: Secondary | ICD-10-CM | POA: Insufficient documentation

## 2015-08-08 NOTE — Patient Instructions (Signed)
Medication Instructions:  Your physician recommends that you continue on your current medications as directed. Please refer to the Current Medication list given to you today.   Labwork: -None  Testing/Procedures: -None  Follow-Up: Your physician recommends that you keep your scheduled  follow-up appointment with Dr. Turner.    Any Other Special Instructions Will Be Listed Below (If Applicable).     If you need a refill on your cardiac medications before your next appointment, please call your pharmacy.    

## 2015-08-08 NOTE — Progress Notes (Signed)
Office Visit    Patient Name: Alfred Dennis Date of Encounter: 08/08/2015  Primary Care Provider:  Osborne Casco, MD Primary Cardiologist:  T. Turner, MD   Chief Complaint    82 year old male with a prior history of ischemic cardiomyopathy, chronic systolic CHF, CAD, hypertension, hyperlipidemia, and recent bout of group B Streptococcus bacteremia, dictated by C. difficile, who presents for follow-up.  Past Medical History    Past Medical History  Diagnosis Date  . Hypertensive heart disease   . Chronic systolic CHF (congestive heart failure) ([place])     a. 05/2015 Echo: EF 15-20%, diff HK.  . Arthritis     "left foot; fingers" (02/03/2012)  . CKD (chronic kidney disease), stage III   . GIB (gastrointestinal bleeding)   . Chronic atrial fibrillation ([facility])     a. CHA2DS2VASc = 7-->chronic coumadin.  . Non-sustained ventricular tachycardia ([place])   . Chronic anticoagulation     a. coumadin.  . Coronary artery disease     a. 05/2000 s/p CABG x 4 (LIMA to LAD, SVG to RI, SVG to OM).  . Mitral valvular regurgitation     a. 05/2000 s/p SJM 33 mm mechanical MV replacement;  b. 05/2015 Echo: mild MR.  . Ischemic dilated cardiomyopathy     a. 01/2012 Echo: EF 25-30%;  b. 05/2015 Echo: EF 15-20%, mild MR, sev dil LA, mod dil RV w/ mod reduced RV fxn, mod dil RA, sev TR, mild PR, PASP 83mmHg.  Marland Kitchen Hyperlipidemia   . Stroke [facility])     a. 05/2000 after CABG  . Carcinoma of colon ([place])   . Basal cell carcinoma of shoulder 10/2010  . Ventricular tachycardia ([place])     a.not felt to be AICD candidate 2/2 advanced age/co-morbidities.  . Bacteremia due to group B Streptococcus     a. 05/2015-->outpt abx x 6 wks complicated by C diff.  . C. difficile colitis     a. Admit 5/1 - 08/01/2015 - occurred in setting of outpt Abx for Group B Strep Bacteremia.   Past Surgical History  Procedure Laterality Date  . Inguinal hernia repair      right  . Cataract extraction w/ intraocular lens   implant, bilateral  ~ 2011  . Valve replacement  05/2000    St. Jude 46mm mechanical valve  . Cardiac valve replacement  05/2000    St. Jude 63mm  . Esophagogastroduodenoscopy  02/08/2012    Procedure: ESOPHAGOGASTRODUODENOSCOPY (EGD);  Surgeon: Gatha Mayer, MD;  Location: [facility] ENDOSCOPY;  Service: Endoscopy;  Laterality: N/A;  . Colonoscopy  02/08/2012    Procedure: COLONOSCOPY;  Surgeon: Gatha Mayer, MD;  Location: [place];  Service: Endoscopy;  Laterality: N/A;  . Laparoscopic partial colectomy  02/10/2012    Procedure: LAPAROSCOPIC PARTIAL COLECTOMY;  Surgeon: Stark Klein, MD;  Location: [place];  Service: General;  Laterality: N/A;  . Coronary artery bypass graft      LIMA to LAD, SVG to IM, SVG to OM2  . Embolectomy Left 03/21/2014    Procedure: LEFT BRACHIAL EMBOLECTOMY;  Surgeon: Elam Dutch, MD;  Location: [place];  Service: Vascular;  Laterality: Left;    Allergies  Allergies  Allergen Reactions  . Tape Other (See Comments)    CERTAIN BANDAGES CAN TEAR THE SKIN; PLEASE USE PAPER TAPE    History of Present Illness    82 year old male with the above complex past medical history including coronary artery disease and mitral valve disease status post  CABG and mechanical mitral valve replacement in 2002. He also has a history of hypertension, hyperlipidemia, stage III chronic kidney disease, and chronic atrial fibrillation. He is chronically anticoagulated in the setting of mechanical mitral valve but does also carry a history of GI bleed. Earlier this spring, he was diagnosed with croup B streptococcus bacteremia and following hospitalization was discharged home on IV ceftriaxone for 6 weeks. Unfortunately, in late May, he developed watery diarrhea and nausea prompting presentation to [facility] on May 21. He was admitted and diagnosed with C. difficile and subsequently treated with oral vancomycin with improvement in diarrhea. He was discharged home on May 25. Since discharge,  he has had no further diarrhea. He remains on oral vancomycin for another 4 days. From a cardiac standpoint, he has been doing well. His weight has been coming down in the setting of illnesses between March and May and he has not been having any issues with volume overload, edema, PND, orthopnea, dizziness, syncope, edema, chest pain, or dyspnea.  Home Medications    Prior to Admission medications   Medication Sig Start Date End Date Taking? Authorizing Provider  Acetaminophen (TYLENOL EXTRA STRENGTH PO) Take 1 tablet by mouth 2 (two) times daily as needed (back pain).   Yes Historical Provider, MD  carvedilol (COREG) 3.125 MG tablet Take 1 tablet (3.125 mg total) by mouth 2 (two) times daily with a meal. 06/11/15  Yes Orson Eva, MD  furosemide (LASIX) 40 MG tablet Take 1 tablet (40 mg total) by mouth 2 (two) times daily. 03/14/14  Yes Bynum Bellows, MD  Multiple Vitamin (MULTIVITAMIN WITH MINERALS) TABS Take 1 tablet by mouth daily.   Yes Historical Provider, MD  potassium chloride (K-DUR) 10 MEQ tablet Take 1 tablet (10 mEq total) by mouth daily. 06/11/15  Yes Orson Eva, MD  saccharomyces boulardii (FLORASTOR) 250 MG capsule Take 1 capsule (250 mg total) by mouth 2 (two) times daily. 08/01/15  Yes Theodis Blaze, MD  simvastatin (ZOCOR) 10 MG tablet Take 10 mg by mouth at bedtime.   Yes Historical Provider, MD  vancomycin (VANCOCIN) 50 mg/mL oral solution Take 2.5 mLs (125 mg total) by mouth 4 (four) times daily. Take until June 4 th, 2017 08/01/15  Yes Theodis Blaze, MD  warfarin (COUMADIN) 5 MG tablet Take 2.5 mg by mouth daily. Sun/Mon/Tues/Wed/Thurs/Fri/Sat (evenings at 1730)   Yes Historical Provider, MD    Review of Systems    Overall, he is recovering from recent hospitalizations and C. difficile, albeit slowly.  He denies chest pain, palpitations, dyspnea, pnd, orthopnea, n, v, dizziness, syncope, edema, weight gain, or early satiety.  All other systems reviewed and are otherwise negative except  as noted above.  Physical Exam    VS:  BP 116/60 mmHg  Pulse 84  Ht 5\' 7"  (1.702 m)  Wt 130 lb 9.6 oz (59.24 kg)  BMI 20.45 kg/m2 , BMI Body mass index is 20.45 kg/(m^2). GEN: Well nourished, well developed, in no acute distress. HEENT: normal. Neck: Supple, no JVD, carotid bruits, or masses. Cardiac: RRR, mechanical S1, no murmurs, rubs, or gallops. No clubbing, cyanosis, edema.  Radials/DP/PT 2+ and equal bilaterally.  Respiratory:  Respirations regular and unlabored, clear to auscultation bilaterally. GI: Soft, nontender, nondistended, BS + x 4. MS: no deformity or atrophy. Skin: warm and dry, no rash. Neuro:  Strength and sensation are intact. Psych: Normal affect.  Accessory Clinical Findings    Hospital records from 5/21 to 5/25 reviewed in detail.  Assessment & Plan    1.  Ischemic cardiomyopathy/chronic systolic congestive heart failure: Patient is doing well and is euvolemic on exam today. Recent echo in March showed an EF of 15-20%. He is very well compensated. He remains on beta blocker and Lasix therapy. He has not been on ACE inhibitor, ARB, spironolactone, or ARNI, presumably secondary to stage III chronic kidney disease along with a history of acute kidney injury in the past.  2. Coronary artery disease: Status post remote coronary artery bypass grafting. He has not been having any chest pain or significant dyspnea on exertion. He did have mild, flat troponin elevations in the setting of both bacteremia followed by C. difficile in April and then May respectively. He has not been having any chest pain. Continue conservative therapy with beta blocker and statin. No aspirin in the setting of ongoing Coumadin anticoagulation and prior history of GI bleed.  3. Group B streptococcus bacteremia/Clostridium difficile: Patient status post hospitalization and discharge on May 25 related to C. difficile following treatment for group B streptococcus bacteremia earlier this year. He  remains on oral vancomycin and has not been having any diarrhea. He has been having labs drawn by [facility] and per daughter, these been reviewed by primary care.  4. Stage III chronic kidney disease: As above, he has had labs drawn by [facility] and these have been forwarded to primary care.  5. Evidence of heart disease: Blood pressure is stable on beta blocker and Lasix therapy.  6. Hyperlipidemia: Continue statin therapy. LDL was 46 and 09 October 2014.  7. Chronic atrial fibrillation: This is rate controlled on beta blocker therapy. He is anticoagulated on Coumadin. INRs are currently being checked by [facility] and followed by our Coumadin clinic here.  8. Status post mechanical mitral valve replacement: On chronic Coumadin as above. Mild MR by echo in March.  9. History of ventricular tachycardia: This is been conservatively managed with beta blocker therapy. He is not felt to be an ICD candidate in the setting of advanced age and comorbidities.  10. Disposition: Follow-up with Dr. Radford Pax in 3 months or sooner if necessary. He'll continue to follow-up at Coumadin clinic as scheduled.  Murray Hodgkins, NP 08/08/2015, 2:22 PM

## 2015-08-15 DIAGNOSIS — K4090 Unilateral inguinal hernia, without obstruction or gangrene, not specified as recurrent: Secondary | ICD-10-CM | POA: Diagnosis not present

## 2015-08-15 DIAGNOSIS — I119 Hypertensive heart disease without heart failure: Secondary | ICD-10-CM | POA: Diagnosis not present

## 2015-08-15 DIAGNOSIS — A047 Enterocolitis due to Clostridium difficile: Secondary | ICD-10-CM | POA: Diagnosis not present

## 2015-08-15 DIAGNOSIS — Z952 Presence of prosthetic heart valve: Secondary | ICD-10-CM | POA: Diagnosis not present

## 2015-08-15 DIAGNOSIS — I509 Heart failure, unspecified: Secondary | ICD-10-CM | POA: Diagnosis not present

## 2015-08-15 DIAGNOSIS — I129 Hypertensive chronic kidney disease with stage 1 through stage 4 chronic kidney disease, or unspecified chronic kidney disease: Secondary | ICD-10-CM | POA: Diagnosis not present

## 2015-08-15 DIAGNOSIS — I052 Rheumatic mitral stenosis with insufficiency: Secondary | ICD-10-CM | POA: Diagnosis not present

## 2015-08-21 DIAGNOSIS — R197 Diarrhea, unspecified: Secondary | ICD-10-CM | POA: Diagnosis not present

## 2015-08-22 ENCOUNTER — Telehealth: Payer: Self-pay | Admitting: Internal Medicine

## 2015-08-22 NOTE — Telephone Encounter (Signed)
Patient is aware of appointment °

## 2015-08-22 NOTE — Telephone Encounter (Signed)
Pt is on oral vancomycin that will end 09/06/15 for recurrent C diff. Pt scheduled to see Amy Esterwood PA 09/03/15@1 :30pm. Please notify pt of appt date and time.

## 2015-09-03 ENCOUNTER — Encounter: Payer: Self-pay | Admitting: Physician Assistant

## 2015-09-03 ENCOUNTER — Ambulatory Visit (INDEPENDENT_AMBULATORY_CARE_PROVIDER_SITE_OTHER): Payer: Medicare Other | Admitting: Physician Assistant

## 2015-09-03 VITALS — BP 104/60 | HR 80 | Ht 63.0 in | Wt 127.5 lb

## 2015-09-03 DIAGNOSIS — A047 Enterocolitis due to Clostridium difficile: Secondary | ICD-10-CM | POA: Diagnosis not present

## 2015-09-03 DIAGNOSIS — A0472 Enterocolitis due to Clostridium difficile, not specified as recurrent: Secondary | ICD-10-CM

## 2015-09-03 DIAGNOSIS — I255 Ischemic cardiomyopathy: Secondary | ICD-10-CM | POA: Diagnosis not present

## 2015-09-03 MED ORDER — VANCOMYCIN 50 MG/ML ORAL SOLUTION: ORAL | Status: DC

## 2015-09-03 MED ORDER — SACCHAROMYCES BOULARDII 250 MG PO CAPS: 250.0000 mg | ORAL_CAPSULE | Freq: Two times a day (BID) | ORAL | Status: DC

## 2015-09-03 NOTE — Progress Notes (Signed)
Patient ID: Alfred Dennis Dennis, male   DOB: 1923/10/11, 82 y.o.   MRN: [id number]   Subjective:    Patient ID: Alfred Dennis Dennis, male    DOB: 05-Mar-1924, 82 y.o.   MRN: [id number]  HPI Alfred Dennis Dennis Is a pleasant 82 year old white male known to Dr. Carlean Purl, who is referred today by Dr. Kelton Pillar regarding relapsing C. difficile colitis. Patient has history of congestive heart failure, he may cardiomyopathy with EF of 15-20%, coronary artery disease, history of nonsustained V. tach, chronic anticoagulation with Coumadin and is status post mitral valve replacement with a St. Jude valve. He was diagnosed with colon cancer in 2013 and underwent a laparoscopic partial colectomy for a transverse colon tumor. His was a T3 N1 lesion. He was seen in consultation by Dr. Benay Spice and elected not to do any adjuvant chemotherapy. Patient had been treated for a group B strep bacteremia in spring of 2017 a believe felt to have originated from a wound on his foot. He required 6 weeks of ceftriaxone IV. He was hospitalized on 07/28/2015 and diagnosed with C. difficile colitis. He was treated with a 14 day course of vancomycin which his daughter says he completed on 08/11/2015. He had responded well and was not having any diarrhea at that time. Unfortunately on 08/18/2015 he relapsed and started having very frequent diarrheal stools. He was retested per Dr. Laurann Montana and C. difficile toxin returned positive and he was restarted on vancomycin on 6/16 /2017. Fortunately his diarrhea quickly resolved and he says at this point he is back to normal. He has not had any significant abdominal discomfort or cramping, no fever or chills, no nausea or vomiting. He says his appetite is fine and he has been eating well. He is generally having just 1 bowel movement per day which is been soft. No melena or hematochezia.  Review of Systems Pertinent positive and negative review of systems were noted in the above HPI section.  All other review of  systems was otherwise negative.  Outpatient Encounter Prescriptions as of 09/03/2015  Medication Sig  . carvedilol (COREG) 3.125 MG tablet Take 1 tablet (3.125 mg total) by mouth 2 (two) times daily with a meal.  . furosemide (LASIX) 40 MG tablet Take 1 tablet (40 mg total) by mouth 2 (two) times daily.  . Multiple Vitamin (MULTIVITAMIN WITH MINERALS) TABS Take 1 tablet by mouth daily.  . potassium chloride (K-DUR) 10 MEQ tablet Take 1 tablet (10 mEq total) by mouth daily.  Marland Kitchen saccharomyces boulardii (FLORASTOR) 250 MG capsule Take 1 capsule (250 mg total) by mouth 2 (two) times daily.  . simvastatin (ZOCOR) 10 MG tablet Take 10 mg by mouth at bedtime.  . vancomycin (VANCOCIN) 50 mg/mL oral solution Take 2.5 mLs (125 mg total) by mouth 4 (four) times daily. Take until June 4 th, 2017  . warfarin (COUMADIN) 5 MG tablet Take 2.5 mg by mouth daily. Sun/Mon/Tues/Wed/Thurs/Fri/Sat (evenings at 1730)  . [DISCONTINUED] saccharomyces boulardii (FLORASTOR) 250 MG capsule Take 1 capsule (250 mg total) by mouth 2 (two) times daily.  . [DISCONTINUED] Acetaminophen (TYLENOL EXTRA STRENGTH PO) Take 1 tablet by mouth 2 (two) times daily as needed (back pain).   No facility-administered encounter medications on file as of 09/03/2015.   Allergies  Allergen Reactions  . Tape Other (See Comments)    CERTAIN BANDAGES CAN TEAR THE SKIN; PLEASE USE PAPER TAPE   Patient Active Problem List   Diagnosis Date Noted  . Hypertensive heart disease   .  Chronic systolic CHF (congestive heart failure) ([place])   . Bacteremia due to group B Streptococcus   . Ventricular tachycardia ([place])   . Hyperlipidemia   . Mitral valvular regurgitation   . Coronary artery disease   . Chronic atrial fibrillation ([place])   . CKD (chronic kidney disease), stage III   . AKI (acute kidney injury) ([place])   . Dehydration 07/28/2015  . C. difficile colitis 07/28/2015  . Clostridium difficile diarrhea 07/28/2015  . Gram-positive bacteremia  06/07/2015  . Anemia 06/06/2015  . GERD (gastroesophageal reflux disease) 06/06/2015  . Sepsis ([place]) 06/06/2015  . Cellulitis of leg, right 06/06/2015  . NSVT (nonsustained ventricular tachycardia) ([place]) 03/12/2014  . Cellulitis of right lower extremity   . Cellulitis 03/09/2014  . Sepsis due to cellulitis ([place]) 03/09/2014  . Chronic systolic heart failure ([place]) 03/09/2014  . Chronic renal disease, stage III 03/09/2014  . Ischemic dilated cardiomyopathy-EF 25-30% 2013 10/04/2013  . Encounter for therapeutic drug monitoring 07/10/2013  . S/P CABG x 3 2002 12/26/2012  . Dyslipidemia 12/26/2012  . Mitral valve disorder 12/13/2012  . Heart valve replaced by other means 12/13/2012  . Carcinoma of colon ([place]) 02/08/2012  . Benign neoplasm of colon 02/08/2012  . Gastritis 02/08/2012  . Colonic mass 02/08/2012  . Iron deficiency anemia 02/05/2012  . Chronic a-fib ([place]) 02/03/2012  . HTN (hypertension) 02/03/2012  . S/P MVR (mitral valve replacement)-St Jude 2002 02/03/2012  . GIB (gastrointestinal bleeding) 02/03/2012   Social History   Social History  . Marital Status: Married    Spouse Name: Pamala Hurry  . Number of Children: 2  . Years of Education: N/A   Occupational History  . Retired     Cytogeneticist   Social History Main Topics  . Smoking status: Former Smoker -- 0.00 packs/day for 0 years    Types: Cigarettes    Quit date: 03/09/1980  . Smokeless tobacco: Never Used     Comment: 02/03/2012 "quit smoking in my 41's"  . Alcohol Use: No  . Drug Use: No  . Sexual Activity: No   Other Topics Concern  . Not on file   Social History Narrative   Lives with wife, [name] story single family home   Retired Regulatory affairs officer   Two grown children-boy & girl   Gets medications from [place] (except Coumadin)    Mr. Maria family history includes Cancer - Prostate in his son; Heart disease in his mother and sister.      Objective:    Filed  Vitals:   09/03/15 1330  BP: 104/60  Pulse: 80    Physical Exam  well-developed elderly white male in no acute distress, pleasant accompanied by his daughter blood pressure 104/60 pulse 80 height 5 foot 3 weight 127 BMI of 22.5. HEENT; nontraumatic, cephalic EOMI PERRLA sclera anicteric, Cardiovascular; regular rate and rhythm with S1-S2 does have a valvular click, Pulmonary ;clear bilaterally, Abdomen ;soft nontender nondistended bowel sounds active there is no palpable mass or hepatosplenomegaly, Rectal; not done, Extremities; no clubbing cyanosis or edema skin warm and dry, Neuropsych; patient is hard of hearing but otherwise alert and appropriate     Assessment & Plan:   #1 82 yo White male with initial diagnosis of C. difficile colitis 07/28/2015, treated with 14 day course of vancomycin and relapsed about a week later. He is currently on a second 14 day course of vancomycin and fairly asymptomatic. #2 chronic anticoagulation-Coumadin #3 status post mitral  valve replacement St. Jude #4 ischemic cardiomyopathy with EF 15-20% #5 history of colon cancer 2013 -T3 N1. He is status post resection and opted not to do any chemotherapy #6 prior CVA  Plan; We will plan to give him a long tapered course of vancomycin over 6 weeks total to assure eradication. He will complete the current 14 day course of 125 mg by mouth 4 times a day then decrease  To 125 mg by mouth 3 times a day 1 week then 125 mg by mouth twice a day 1 week then 125 mg by mouth daily 1 week then every other day 1 week  Continue Florastor one by mouth twice a day times for 6 weeks He will follow-up with Dr. Carlean Purl or myself as needed and is asked to call here should he have any problems with recurrent diarrhea after he completes this regimen.  We will also contact patient's daughter to have him get an INR checked through his cardiologist given need for prolonged antibiotics.  Alfred Dennis Dennis Genia Harold PA-C 09/03/2015   Cc:  Kelton Pillar, MD

## 2015-09-03 NOTE — Patient Instructions (Addendum)
Sent a prescription to [facility] for [employer].   Complete the 14 day course of Vancomycin.   Vancomycin 125 mg, ( 2.5 ml )  Take 1 dose 3 times daily for 7 days. Take 1 dose 2 times daily for 7 days. Take 1 dose daily for 7 days.  Take 1 dose every other day for 7 days.    Call for any problems, ask for Dr. Marla Roe nurse.  We call [facility]  With the Vancomycin prescription and the Florastor.

## 2015-09-04 ENCOUNTER — Ambulatory Visit (INDEPENDENT_AMBULATORY_CARE_PROVIDER_SITE_OTHER): Payer: Medicare Other | Admitting: *Deleted

## 2015-09-04 DIAGNOSIS — Z954 Presence of other heart-valve replacement: Secondary | ICD-10-CM

## 2015-09-04 DIAGNOSIS — Z952 Presence of prosthetic heart valve: Secondary | ICD-10-CM

## 2015-09-04 DIAGNOSIS — Z5181 Encounter for therapeutic drug level monitoring: Secondary | ICD-10-CM | POA: Diagnosis not present

## 2015-09-04 DIAGNOSIS — I059 Rheumatic mitral valve disease, unspecified: Secondary | ICD-10-CM | POA: Diagnosis not present

## 2015-09-04 LAB — POCT INR: INR: 4.3

## 2015-09-18 ENCOUNTER — Encounter (INDEPENDENT_AMBULATORY_CARE_PROVIDER_SITE_OTHER): Payer: Self-pay

## 2015-09-18 ENCOUNTER — Ambulatory Visit (INDEPENDENT_AMBULATORY_CARE_PROVIDER_SITE_OTHER): Payer: Medicare Other | Admitting: *Deleted

## 2015-09-18 DIAGNOSIS — Z954 Presence of other heart-valve replacement: Secondary | ICD-10-CM | POA: Diagnosis not present

## 2015-09-18 DIAGNOSIS — I059 Rheumatic mitral valve disease, unspecified: Secondary | ICD-10-CM

## 2015-09-18 DIAGNOSIS — Z5181 Encounter for therapeutic drug level monitoring: Secondary | ICD-10-CM | POA: Diagnosis not present

## 2015-09-18 DIAGNOSIS — Z952 Presence of prosthetic heart valve: Secondary | ICD-10-CM

## 2015-09-18 LAB — POCT INR: INR: 3.9

## 2015-09-18 MED ORDER — WARFARIN SODIUM 2.5 MG PO TABS: ORAL_TABLET | ORAL | Status: DC

## 2015-09-23 DIAGNOSIS — L97512 Non-pressure chronic ulcer of other part of right foot with fat layer exposed: Secondary | ICD-10-CM | POA: Diagnosis not present

## 2015-09-23 NOTE — Progress Notes (Signed)
Agree with Ms. Esterwood's assessment and plan. Vasilisa Vore E. Bailie Christenbury, MD, FACG   

## 2015-09-30 DIAGNOSIS — I509 Heart failure, unspecified: Secondary | ICD-10-CM | POA: Diagnosis not present

## 2015-09-30 DIAGNOSIS — I4891 Unspecified atrial fibrillation: Secondary | ICD-10-CM | POA: Diagnosis not present

## 2015-09-30 DIAGNOSIS — Z7901 Long term (current) use of anticoagulants: Secondary | ICD-10-CM | POA: Diagnosis not present

## 2015-09-30 DIAGNOSIS — I119 Hypertensive heart disease without heart failure: Secondary | ICD-10-CM | POA: Diagnosis not present

## 2015-09-30 DIAGNOSIS — I129 Hypertensive chronic kidney disease with stage 1 through stage 4 chronic kidney disease, or unspecified chronic kidney disease: Secondary | ICD-10-CM | POA: Diagnosis not present

## 2015-09-30 DIAGNOSIS — Z952 Presence of prosthetic heart valve: Secondary | ICD-10-CM | POA: Diagnosis not present

## 2015-09-30 DIAGNOSIS — A047 Enterocolitis due to Clostridium difficile: Secondary | ICD-10-CM | POA: Diagnosis not present

## 2015-09-30 DIAGNOSIS — I052 Rheumatic mitral stenosis with insufficiency: Secondary | ICD-10-CM | POA: Diagnosis not present

## 2015-10-02 ENCOUNTER — Ambulatory Visit (INDEPENDENT_AMBULATORY_CARE_PROVIDER_SITE_OTHER): Payer: Medicare Other | Admitting: *Deleted

## 2015-10-02 DIAGNOSIS — Z5181 Encounter for therapeutic drug level monitoring: Secondary | ICD-10-CM | POA: Diagnosis not present

## 2015-10-02 DIAGNOSIS — I059 Rheumatic mitral valve disease, unspecified: Secondary | ICD-10-CM | POA: Diagnosis not present

## 2015-10-02 DIAGNOSIS — Z954 Presence of other heart-valve replacement: Secondary | ICD-10-CM

## 2015-10-02 DIAGNOSIS — Z952 Presence of prosthetic heart valve: Secondary | ICD-10-CM

## 2015-10-02 LAB — POCT INR: INR: 3

## 2015-10-10 DIAGNOSIS — L97512 Non-pressure chronic ulcer of other part of right foot with fat layer exposed: Secondary | ICD-10-CM | POA: Diagnosis not present

## 2015-10-16 ENCOUNTER — Ambulatory Visit (INDEPENDENT_AMBULATORY_CARE_PROVIDER_SITE_OTHER): Payer: Medicare Other | Admitting: *Deleted

## 2015-10-16 DIAGNOSIS — I059 Rheumatic mitral valve disease, unspecified: Secondary | ICD-10-CM | POA: Diagnosis not present

## 2015-10-16 DIAGNOSIS — Z952 Presence of prosthetic heart valve: Secondary | ICD-10-CM

## 2015-10-16 DIAGNOSIS — Z5181 Encounter for therapeutic drug level monitoring: Secondary | ICD-10-CM

## 2015-10-16 DIAGNOSIS — Z954 Presence of other heart-valve replacement: Secondary | ICD-10-CM

## 2015-10-16 LAB — POCT INR: INR: 3.7

## 2015-10-25 DIAGNOSIS — L97512 Non-pressure chronic ulcer of other part of right foot with fat layer exposed: Secondary | ICD-10-CM | POA: Diagnosis not present

## 2015-10-30 ENCOUNTER — Ambulatory Visit (INDEPENDENT_AMBULATORY_CARE_PROVIDER_SITE_OTHER): Payer: Medicare Other | Admitting: *Deleted

## 2015-10-30 DIAGNOSIS — Z952 Presence of prosthetic heart valve: Secondary | ICD-10-CM

## 2015-10-30 DIAGNOSIS — I059 Rheumatic mitral valve disease, unspecified: Secondary | ICD-10-CM

## 2015-10-30 DIAGNOSIS — Z5181 Encounter for therapeutic drug level monitoring: Secondary | ICD-10-CM | POA: Diagnosis not present

## 2015-10-30 DIAGNOSIS — Z954 Presence of other heart-valve replacement: Secondary | ICD-10-CM

## 2015-10-30 LAB — POCT INR: INR: 2.6

## 2015-11-12 ENCOUNTER — Telehealth: Payer: Self-pay | Admitting: Cardiology

## 2015-11-12 NOTE — Telephone Encounter (Signed)
Please have patient stop coreg and followup with PA in office in 1 week.  Agree with holding diuretic and if SBP > 100 mhg In am restart diuretic

## 2015-11-12 NOTE — Telephone Encounter (Signed)
[name] is calling because Alfred Dennis blood pressure is low and his PCP(Dr. Laurann Montana)  is wanting to make sure that Dr. Radford Pax is ok with the instructions that they gave. Dr. Laurann Montana said for him to omit his Furosemide and do not take his evening Coreg and increase his fluids and call Dr. Radford Pax and then cut his morning lasik in half. . Blood pressure is 103/50 , and pulse 49.Marland Kitchen Please call   Thanks

## 2015-11-12 NOTE — Telephone Encounter (Signed)
Instructed [name] to have patient STOP COREG and hold diuretic. She understands to check BP in the AM and if systolic is greater than [id number], he may restart diuretic. Scheduled patient 9/12 with Lyda Jester. [name] agrees with treatment plan.

## 2015-11-14 ENCOUNTER — Encounter: Payer: Self-pay | Admitting: Cardiology

## 2015-11-19 ENCOUNTER — Ambulatory Visit (INDEPENDENT_AMBULATORY_CARE_PROVIDER_SITE_OTHER): Payer: Medicare Other | Admitting: Pharmacist

## 2015-11-19 ENCOUNTER — Encounter: Payer: Self-pay | Admitting: Cardiology

## 2015-11-19 ENCOUNTER — Ambulatory Visit (INDEPENDENT_AMBULATORY_CARE_PROVIDER_SITE_OTHER): Payer: Medicare Other | Admitting: Cardiology

## 2015-11-19 VITALS — BP 130/82 | HR 64 | Ht 63.0 in | Wt 132.4 lb

## 2015-11-19 DIAGNOSIS — Z954 Presence of other heart-valve replacement: Secondary | ICD-10-CM

## 2015-11-19 DIAGNOSIS — Z8679 Personal history of other diseases of the circulatory system: Secondary | ICD-10-CM | POA: Diagnosis not present

## 2015-11-19 DIAGNOSIS — Z5181 Encounter for therapeutic drug level monitoring: Secondary | ICD-10-CM

## 2015-11-19 DIAGNOSIS — Z952 Presence of prosthetic heart valve: Secondary | ICD-10-CM

## 2015-11-19 DIAGNOSIS — I059 Rheumatic mitral valve disease, unspecified: Secondary | ICD-10-CM | POA: Diagnosis not present

## 2015-11-19 DIAGNOSIS — I255 Ischemic cardiomyopathy: Secondary | ICD-10-CM

## 2015-11-19 LAB — POCT INR: INR: 3.8

## 2015-11-19 NOTE — Progress Notes (Signed)
11/19/2015 Alfred Dennis   31-Aug-1923  [id number]  Primary Physician Osborne Casco, MD Primary Cardiologist: Dr. Radford Pax  Reason for Visit/CC: Hypotension   HPI:  Patient is a 82 year old male, followed by Dr. Radford Pax, with a prior history ischemic cardiomyopathy, chronic systolic heart failure, CAD, hypertension, hyperlipidemia and prior history of group B streptococcus bacteremia in the setting of C. Difficile.  He presents to clinic today for evaluation given recent issues of hypertension. Several weeks ago he had symptoms of near syncope at home. His daughter checked his blood pressure and his systolic blood pressure was low in the upper 80s. He was seen by his PCP Dr. Laurann Montana who recommended that he discontinue his carvedilol and to take his Lasix only if systolic blood pressure is greater than 100.  In clinic today,  his blood pressure is improved however he did not take his Lasix earlier this morning. His blood pressure is 130/80. Pulse rate is 66. He has mild 1+ bilateral pitting edema on exam. He denies any dyspnea. No lightheadedness, dizziness, near syncope. His daughter has been trying to adjust his diet and is trying to get him on a low-sodium regimen. She has recently moved in with her parents and plan to do the cooking.    Current Outpatient Prescriptions  Medication Sig Dispense Refill  . furosemide (LASIX) 40 MG tablet Take 1 tablet (40 mg total) by mouth 2 (two) times daily. 30 tablet   . Multiple Vitamin (MULTIVITAMIN WITH MINERALS) TABS Take 1 tablet by mouth daily.    . potassium chloride (K-DUR) 10 MEQ tablet Take 1 tablet (10 mEq total) by mouth daily. 30 tablet 3  . simvastatin (ZOCOR) 10 MG tablet Take 10 mg by mouth at bedtime.    Marland Kitchen warfarin (COUMADIN) 2.5 MG tablet Take as directed by coumadin clinic 90 tablet 1   No current facility-administered medications for this visit.     Allergies  Allergen Reactions  . Tape Other (See Comments)    CERTAIN  BANDAGES CAN TEAR THE SKIN; PLEASE USE PAPER TAPE    Social History   Social History  . Marital status: Married    Spouse name: Pamala Hurry  . Number of children: 2  . Years of education: N/A   Occupational History  . Retired     Cytogeneticist   Social History Main Topics  . Smoking status: Former Smoker    Packs/day: 0.00    Years: 0.00    Types: Cigarettes    Quit date: 03/09/1980  . Smokeless tobacco: Never Used     Comment: 02/03/2012 "quit smoking in my 89's"  . Alcohol use No  . Drug use: No  . Sexual activity: No   Other Topics Concern  . Not on file   Social History Narrative   Lives with wife, [name] story single family home   Retired Regulatory affairs officer   Two grown children-boy & girl   Gets medications from [place] (except Coumadin)     Review of Systems: General: negative for chills, fever, night sweats or weight changes.  Cardiovascular: negative for chest pain, dyspnea on exertion, edema, orthopnea, palpitations, paroxysmal nocturnal dyspnea or shortness of breath Dermatological: negative for rash Respiratory: negative for cough or wheezing Urologic: negative for hematuria Abdominal: negative for nausea, vomiting, diarrhea, bright red blood per rectum, melena, or hematemesis Neurologic: negative for visual changes, syncope, or dizziness All other systems reviewed and are otherwise negative except as noted above.  Blood pressure 130/82, pulse 64, height 5\' 3"  (1.6 m), weight 132 lb 6.4 oz (60.1 kg).  General appearance: alert, cooperative and no distress Neck: no carotid bruit and no JVD Lungs: clear to auscultation bilaterally Heart: regular rate and rhythm, S1, S2 normal, no murmur, click, rub or gallop Extremities: 1+ bilateral LE pitting edema Pulses: 2+ and symmetric Skin: warm and dry Neurologic: Grossly normal  EKG not peformed   ASSESSMENT AND PLAN:   1. Hypotension: BP improved since discontinuing his Coreg. BP  remains soft but he continues to need lasix given chronic systolic HF and 1+ bilateral LEE on exam. No elevated JVD. Lungs are CTAB. Continue lasix BID if BP allows. Patient and daughter advised to hold lasix dose if SBP is <100 mmHg. Patient also encouraged to Elevate legs/ use compression stockings.   2. Chronic Systolic HF/ ICM: 1+ bilateral LEE on exam. Lungs are CTAB bilaterally. No JVD. No dyspnea, orthopnea nor PND. He sleeps with only 1 pillow. Continue Lasix BID, if BP allows. Coreg stopped due to hypotension, which has resolved. No ACE/ARBs given BP. Low sodium diet advised.   3. CAD: stable w/o angina.   4. HLD: continue simvastatin   PLAN  Keep 3 month f/u with Dr. Radford Pax 11/28/15.   Lyda Jester PA-C 11/19/2015 10:22 AM

## 2015-11-19 NOTE — Patient Instructions (Addendum)
Your physician recommends that you continue on your current medications as directed. Please refer to the Current Medication list given to you today.  Your physician recommends that you schedule a follow-up appointment in:  AS  SCHEDULED  WITH  DR  TURNER  HOLD  LASIX  IF   TOP  NUMBER (SYSTOLIC  LESS THAN  [id number] )

## 2015-11-27 DIAGNOSIS — L84 Corns and callosities: Secondary | ICD-10-CM | POA: Diagnosis not present

## 2015-11-27 DIAGNOSIS — I739 Peripheral vascular disease, unspecified: Secondary | ICD-10-CM | POA: Diagnosis not present

## 2015-11-27 DIAGNOSIS — L603 Nail dystrophy: Secondary | ICD-10-CM | POA: Diagnosis not present

## 2015-11-27 DIAGNOSIS — L97512 Non-pressure chronic ulcer of other part of right foot with fat layer exposed: Secondary | ICD-10-CM | POA: Diagnosis not present

## 2015-11-28 ENCOUNTER — Telehealth: Payer: Self-pay | Admitting: Cardiology

## 2015-11-28 ENCOUNTER — Encounter: Payer: Self-pay | Admitting: Cardiology

## 2015-11-28 ENCOUNTER — Ambulatory Visit (INDEPENDENT_AMBULATORY_CARE_PROVIDER_SITE_OTHER): Payer: Medicare Other | Admitting: Cardiology

## 2015-11-28 VITALS — BP 116/56 | HR 73 | Ht 66.0 in | Wt 126.2 lb

## 2015-11-28 DIAGNOSIS — I5022 Chronic systolic (congestive) heart failure: Secondary | ICD-10-CM

## 2015-11-28 DIAGNOSIS — I82402 Acute embolism and thrombosis of unspecified deep veins of left lower extremity: Secondary | ICD-10-CM

## 2015-11-28 DIAGNOSIS — I10 Essential (primary) hypertension: Secondary | ICD-10-CM | POA: Diagnosis not present

## 2015-11-28 DIAGNOSIS — I059 Rheumatic mitral valve disease, unspecified: Secondary | ICD-10-CM

## 2015-11-28 DIAGNOSIS — I4729 Other ventricular tachycardia: Secondary | ICD-10-CM

## 2015-11-28 DIAGNOSIS — I255 Ischemic cardiomyopathy: Secondary | ICD-10-CM | POA: Diagnosis not present

## 2015-11-28 DIAGNOSIS — E785 Hyperlipidemia, unspecified: Secondary | ICD-10-CM

## 2015-11-28 DIAGNOSIS — I472 Ventricular tachycardia: Secondary | ICD-10-CM

## 2015-11-28 DIAGNOSIS — I482 Chronic atrial fibrillation, unspecified: Secondary | ICD-10-CM

## 2015-11-28 DIAGNOSIS — I420 Dilated cardiomyopathy: Secondary | ICD-10-CM

## 2015-11-28 DIAGNOSIS — R600 Localized edema: Secondary | ICD-10-CM

## 2015-11-28 DIAGNOSIS — I2583 Coronary atherosclerosis due to lipid rich plaque: Secondary | ICD-10-CM

## 2015-11-28 DIAGNOSIS — I2510 Atherosclerotic heart disease of native coronary artery without angina pectoris: Secondary | ICD-10-CM | POA: Diagnosis not present

## 2015-11-28 NOTE — Telephone Encounter (Signed)
New Message  Pt voiced here is the dosage of the medication MD-Turner requested: furosemide 40 mg tablets twice daily

## 2015-11-28 NOTE — Progress Notes (Signed)
Cardiology Office Note    Date:  11/28/2015   ID:  Alfred Dennis, DOB Jan 11, 1924, MRN [id number]  PCP:  Osborne Casco, MD  Cardiologist:  Fransico Him, MD   Chief Complaint  Patient presents with  . Coronary Artery Disease  . Hypertension  . Atrial Fibrillation    History of Present Illness:  Alfred Dennis is a 82 y.o. male with a history of severe MR s/p MVR, HTN, dyslipidemia, chronic atrial fibrillation, CAD s/p CABG and ischemic dilated CM with EF 35-30% at time of his colon surgery and chronic systolic CHF. He has a history of VT but due to his age and comorbidities he was not felt to be a candidate for ICD.He is doing well today.   He denies any chest pain. He has chronic DOE which is unchanged. This only occurs with overexertion.  He denies any palpitations, dizziness or syncope. He occasionally has some mild RLE edema which is chronic and improved since he got out of the hospital.      Past Medical History:  Diagnosis Date  . Arthritis    "left foot; fingers" (02/03/2012)  . Bacteremia due to group B Streptococcus    a. 05/2015-->outpt abx x 6 wks complicated by C diff.  . Basal cell carcinoma of shoulder 10/2010  . C. difficile colitis    a. Admit 5/1 - 08/01/2015 - occurred in setting of outpt Abx for Group B Strep Bacteremia.  . Carcinoma of colon ([place])   . Chronic anticoagulation    a. coumadin.  . Chronic atrial fibrillation (HCC)    a. CHA2DS2VASc = 7-->chronic coumadin.  . Chronic systolic CHF (congestive heart failure) ([place])    a. 05/2015 Echo: EF 15-20%, diff HK.  . CKD (chronic kidney disease), stage III   . Coronary artery disease    a. 05/2000 s/p CABG x 4 (LIMA to LAD, SVG to RI, SVG to OM).  Marland Kitchen GIB (gastrointestinal bleeding)   . Hyperlipidemia   . Hypertension   . Hypertensive heart disease   . Ischemic dilated cardiomyopathy    a. 01/2012 Echo: EF 25-30%;  b. 05/2015 Echo: EF 15-20%, mild MR, sev dil LA, mod dil RV w/ mod reduced RV  fxn, mod dil RA, sev TR, mild PR, PASP 23mmHg.  . Mitral valvular regurgitation    a. 05/2000 s/p SJM 33 mm mechanical MV replacement;  b. 05/2015 Echo: mild MR.  . Non-sustained ventricular tachycardia ([place])   . Prediabetes   . Stroke [facility])    a. 05/2000 after CABG  . Ventricular tachycardia ([place])    a.not felt to be AICD candidate 2/2 advanced age/co-morbidities.    Past Surgical History:  Procedure Laterality Date  . CARDIAC VALVE REPLACEMENT  05/2000   St. Jude 37mm  . CATARACT EXTRACTION W/ INTRAOCULAR LENS  IMPLANT, BILATERAL Bilateral ~ 2011  . COLONOSCOPY  02/08/2012   Procedure: COLONOSCOPY;  Surgeon: Gatha Mayer, MD;  Location: [place];  Service: Endoscopy;  Laterality: N/A;  . CORONARY ARTERY BYPASS GRAFT     LIMA to LAD, SVG to IM, SVG to OM2  . EMBOLECTOMY Left 03/21/2014   Procedure: LEFT BRACHIAL EMBOLECTOMY;  Surgeon: Elam Dutch, MD;  Location: [place];  Service: Vascular;  Laterality: Left;  . ESOPHAGOGASTRODUODENOSCOPY  02/08/2012   Procedure: ESOPHAGOGASTRODUODENOSCOPY (EGD);  Surgeon: Gatha Mayer, MD;  Location[facility] ENDOSCOPY;  Service: Endoscopy;  Laterality: N/A;  . INGUINAL HERNIA REPAIR Right   . LAPAROSCOPIC PARTIAL COLECTOMY  02/10/2012   Procedure: LAPAROSCOPIC PARTIAL COLECTOMY;  Surgeon: Stark Klein, MD;  Location: [place];  Service: General;  Laterality: N/A;  . VALVE REPLACEMENT  05/2000   St. Jude 72mm mechanical valve    Current Medications: Outpatient Medications Prior to Visit  Medication Sig Dispense Refill  . furosemide (LASIX) 40 MG tablet Take 1 tablet (40 mg total) by mouth 2 (two) times daily. 30 tablet   . Multiple Vitamin (MULTIVITAMIN WITH MINERALS) TABS Take 1 tablet by mouth daily.    . potassium chloride (K-DUR) 10 MEQ tablet Take 1 tablet (10 mEq total) by mouth daily. 30 tablet 3  . simvastatin (ZOCOR) 10 MG tablet Take 10 mg by mouth at bedtime.    Marland Kitchen warfarin (COUMADIN) 2.5 MG tablet Take as directed by coumadin clinic 90  tablet 1   No facility-administered medications prior to visit.      Allergies:   Tape   Social History   Social History  . Marital status: Married    Spouse name: Pamala Hurry  . Number of children: 2  . Years of education: N/A   Occupational History  . Retired     Cytogeneticist   Social History Main Topics  . Smoking status: Former Smoker    Packs/day: 0.00    Years: 0.00    Types: Cigarettes    Quit date: 03/09/1980  . Smokeless tobacco: Never Used     Comment: 02/03/2012 "quit smoking in my 64's"  . Alcohol use No  . Drug use: No  . Sexual activity: No   Other Topics Concern  . None   Social History Narrative   Lives with wife, [name] story single family home   Retired Regulatory affairs officer   Two grown children-boy & girl   Gets medications from [place] (except Coumadin)     Family History:  The patient's family history includes Cancer - Prostate in his son; Heart disease in his mother and sister.   ROS:   Please see the history of present illness.    ROS All other systems reviewed and are negative.  No flowsheet data found.     PHYSICAL EXAM:   VS:  BP (!) 116/56   Pulse 73   Ht 5\' 6"  (1.676 m)   Wt 126 lb 2.6 oz (57.2 kg)   SpO2 99%   BMI 20.36 kg/m    GEN: Well nourished, well developed, in no acute distress  HEENT: normal  Neck: no JVD, carotid bruits, or masses Cardiac: RRR; no murmurs, rubs, or gallops.  3+ edema of LLE in the calf.  Intact distal pulses bilaterally.  Respiratory:  clear to auscultation bilaterally, normal work of breathing GI: soft, nontender, nondistended, + BS MS: no deformity or atrophy  Skin: warm and dry, no rash Neuro:  Alert and Oriented x 3, Strength and sensation are intact Psych: euthymic mood, full affect  Wt Readings from Last 3 Encounters:  11/28/15 126 lb 2.6 oz (57.2 kg)  11/19/15 132 lb 6.4 oz (60.1 kg)  09/03/15 127 lb 8 oz (57.8 kg)      Studies/Labs Reviewed:   EKG:  EKG is not  ordered today.    Recent Labs: 06/05/2015: B Natriuretic Peptide 1,585.8 06/09/2015: Magnesium 2.5 07/29/2015: ALT 33 08/01/2015: BUN 18; Creatinine, Ser 1.18; Hemoglobin 11.7; Platelets 123; Potassium 3.5; Sodium 141   Lipid Panel    Component Value Date/Time   CHOL 110 10/23/2014 0822   TRIG 112.0 10/23/2014  [id number]   HDL 41.30 10/23/2014 0822   CHOLHDL 3 10/23/2014 0822   VLDL 22.4 10/23/2014 0822   LDLCALC 46 10/23/2014 0822    Additional studies/ records that were reviewed today include:  none    ASSESSMENT:    1. Mitral valve disorder   2. Ischemic dilated cardiomyopathy-EF 25-30% 2013   3. NSVT (nonsustained ventricular tachycardia) ([place])   4. Essential hypertension   5. Coronary artery disease due to lipid rich plaque   6. Chronic systolic CHF (congestive heart failure) ([place])   7. Chronic atrial fibrillation (HCC)   8. Hyperlipidemia   9. Edema of lower extremity, unspecified laterality      PLAN:  In order of problems listed above:  1. Severe MR s/p mechanical MVR.  Continue warfarin. 2. Ischemic DCM EF 25-30% - soft BP limits ACE I and BB therapy.   3. NSVT with no reoccurence. 4. HTN - BP controlled and his daughter says that it has run as low as [id number] systolic. He is on no BP meds at this time.  5. ASCAD with no angina.  He is not on ASA due to warfarin.  Continue statin.  6. Chronic systolic CHF - he appears euvolemic on exam.  His weight is down.  Cannot use standard CHF meds due to soft BP.  He uses Lasix when he cans but sometimes BP is too low.  His daughter does not know what the dose is and will check and call us.  Check BMET. 7. Chronic atrial fibrillation rate controlled.   8. Hyperlipidemia LDL goal < 70.  Continue statin. Check FLp and ALT. 9. LLE edema - this is significantly out of proportion to the RLE - he is on warfarin so unlikely to have a DVT but will check a LE venous doppler.    Medication Adjustments/Labs and Tests Ordered: Current  medicines are reviewed at length with the patient today.  Concerns regarding medicines are outlined above.  Medication changes, Labs and Tests ordered today are listed in the Patient Instructions below.  There are no Patient Instructions on file for this visit.   Signed, Fransico Him, MD  11/28/2015 10:39 AM    [address], [place], [place]  [address] Phone: [phone]; Fax: [phone]

## 2015-11-28 NOTE — Telephone Encounter (Signed)
[name] advised to decrease lasix to 40mg  daily.  [name] confirmed KCL dose is 10 mEq daily. I will forward to Dr Radford Pax for review.

## 2015-11-28 NOTE — Telephone Encounter (Signed)
Will FORWARD  TO   DR TURNER  FOR   REVIEW./CY

## 2015-11-28 NOTE — Telephone Encounter (Signed)
Continue current potassium dose

## 2015-11-28 NOTE — Telephone Encounter (Signed)
[name] aware to continue current potassium dose.

## 2015-11-28 NOTE — Telephone Encounter (Signed)
Decrease Lasix to 40mg  daily - please verify the potassium dose he is taking

## 2015-11-28 NOTE — Patient Instructions (Signed)
Medication Instructions:  Your physician recommends that you continue on your current medications as directed. Please refer to the Current Medication list given to you today.   Labwork: Your physician recommends that you return for a FASTING lipid profile: on Monday, September 25   Testing/Procedures: Your physician has requested that you have a lower extremity arterial exercise duplex. During this test, exercise and ultrasound are used to evaluate arterial blood flow in the legs. Allow one hour for this exam. There are no restrictions or special instructions.   Follow-Up: Your physician wants you to follow-up in: 6 months with Melina Copa, PA and 1 year with Dr. Mallie Snooks will receive a reminder letter in the mail two months in advance. If you Stace't receive a letter, please call our office to schedule the follow-up appointment.   Any Other Special Instructions Will Be Listed Below (If Applicable).     If you need a refill on your cardiac medications before your next appointment, please call your pharmacy.

## 2015-12-02 ENCOUNTER — Other Ambulatory Visit: Payer: Medicare Other | Admitting: *Deleted

## 2015-12-02 DIAGNOSIS — I5022 Chronic systolic (congestive) heart failure: Secondary | ICD-10-CM | POA: Diagnosis not present

## 2015-12-02 DIAGNOSIS — I2583 Coronary atherosclerosis due to lipid rich plaque: Secondary | ICD-10-CM

## 2015-12-02 DIAGNOSIS — I2510 Atherosclerotic heart disease of native coronary artery without angina pectoris: Secondary | ICD-10-CM | POA: Diagnosis not present

## 2015-12-02 LAB — HEPATIC FUNCTION PANEL
ALT: 33 U/L (ref 9–46)
AST: 49 U/L — AB (ref 10–35)
Albumin: 4.4 g/dL (ref 3.6–5.1)
Alkaline Phosphatase: 56 U/L (ref 40–115)
BILIRUBIN INDIRECT: 0.7 mg/dL (ref 0.2–1.2)
Bilirubin, Direct: 0.3 mg/dL — ABNORMAL HIGH (ref <=0.2)
TOTAL PROTEIN: 6.6 g/dL (ref 6.1–8.1)
Total Bilirubin: 1 mg/dL (ref 0.2–1.2)

## 2015-12-02 LAB — LIPID PANEL
Cholesterol: 101 mg/dL — ABNORMAL LOW (ref 125–200)
HDL: 51 mg/dL (ref >=40)
LDL Cholesterol: 34 mg/dL (ref <130)
TRIGLYCERIDES: 80 mg/dL (ref <150)
Total CHOL/HDL Ratio: 2 Ratio (ref <=5.0)
VLDL: 16 mg/dL (ref <30)

## 2015-12-02 LAB — BASIC METABOLIC PANEL
BUN: 30 mg/dL — ABNORMAL HIGH (ref 7–25)
CHLORIDE: 106 mmol/L (ref 98–110)
CO2: 26 mmol/L (ref 20–31)
CREATININE: 1.39 mg/dL — AB (ref 0.70–1.11)
Calcium: 9.9 mg/dL (ref 8.6–10.3)
Glucose, Bld: 111 mg/dL — ABNORMAL HIGH (ref 65–99)
POTASSIUM: 4.6 mmol/L (ref 3.5–5.3)
Sodium: 141 mmol/L (ref 135–146)

## 2015-12-06 ENCOUNTER — Ambulatory Visit (HOSPITAL_COMMUNITY)
Admission: RE | Admit: 2015-12-06 | Discharge: 2015-12-06 | Disposition: A | Discharge status: Home / Self Care | Payer: Medicare Other | Source: Ambulatory Visit | Attending: Cardiology | Admitting: Cardiology

## 2015-12-06 DIAGNOSIS — Z86718 Personal history of other venous thrombosis and embolism: Secondary | ICD-10-CM | POA: Diagnosis not present

## 2015-12-06 DIAGNOSIS — I82402 Acute embolism and thrombosis of unspecified deep veins of left lower extremity: Secondary | ICD-10-CM

## 2015-12-06 DIAGNOSIS — M7989 Other specified soft tissue disorders: Secondary | ICD-10-CM | POA: Diagnosis not present

## 2015-12-10 ENCOUNTER — Ambulatory Visit (INDEPENDENT_AMBULATORY_CARE_PROVIDER_SITE_OTHER): Payer: Medicare Other

## 2015-12-10 DIAGNOSIS — Z5181 Encounter for therapeutic drug level monitoring: Secondary | ICD-10-CM | POA: Diagnosis not present

## 2015-12-10 DIAGNOSIS — I059 Rheumatic mitral valve disease, unspecified: Secondary | ICD-10-CM | POA: Diagnosis not present

## 2015-12-10 DIAGNOSIS — Z952 Presence of prosthetic heart valve: Secondary | ICD-10-CM | POA: Diagnosis not present

## 2015-12-10 LAB — POCT INR: INR: 2.5

## 2015-12-19 DIAGNOSIS — L97512 Non-pressure chronic ulcer of other part of right foot with fat layer exposed: Secondary | ICD-10-CM | POA: Diagnosis not present

## 2016-01-07 ENCOUNTER — Ambulatory Visit (INDEPENDENT_AMBULATORY_CARE_PROVIDER_SITE_OTHER): Payer: Medicare Other

## 2016-01-07 DIAGNOSIS — Z5181 Encounter for therapeutic drug level monitoring: Secondary | ICD-10-CM | POA: Diagnosis not present

## 2016-01-07 DIAGNOSIS — Z952 Presence of prosthetic heart valve: Secondary | ICD-10-CM

## 2016-01-07 DIAGNOSIS — I059 Rheumatic mitral valve disease, unspecified: Secondary | ICD-10-CM

## 2016-01-07 LAB — POCT INR: INR: 2.8

## 2016-01-08 DIAGNOSIS — L97512 Non-pressure chronic ulcer of other part of right foot with fat layer exposed: Secondary | ICD-10-CM | POA: Diagnosis not present

## 2016-01-08 DIAGNOSIS — L97521 Non-pressure chronic ulcer of other part of left foot limited to breakdown of skin: Secondary | ICD-10-CM | POA: Diagnosis not present

## 2016-01-22 DIAGNOSIS — L97512 Non-pressure chronic ulcer of other part of right foot with fat layer exposed: Secondary | ICD-10-CM | POA: Diagnosis not present

## 2016-01-22 DIAGNOSIS — L97521 Non-pressure chronic ulcer of other part of left foot limited to breakdown of skin: Secondary | ICD-10-CM | POA: Diagnosis not present

## 2016-01-24 ENCOUNTER — Telehealth: Payer: Self-pay | Admitting: Cardiology

## 2016-01-24 NOTE — Telephone Encounter (Signed)
Notified [name] to check BP and if Systolic is greater than [id number] to give him Lasix 40 mg BID x 2 days and follow up in office.  Advised her also to weigh him daily along with taking the BP.  Made him a follow up appointment for 11/27 at 3:30 with Kathrene Alu. She will call if swelling does not decrease.  States she will be stricter with him regarding his intake.

## 2016-01-24 NOTE — Telephone Encounter (Signed)
LM w/daughter [name] to call back

## 2016-01-24 NOTE — Telephone Encounter (Signed)
New Message:    Pt had to reduced his Lasix from 2 pills to 1 pill. Pt is retaining fluid,he may need to go back taking 2 tablets of Lasix.SHis daughter is also concerned about how much water he should drink,since he is retaining fluid.

## 2016-01-24 NOTE — Telephone Encounter (Signed)
Please have her check his BP to make sure it is ok and if SBP > 155mmHg then increase Lasix to 40mg  BID for 2 days and then 40mg  daily and followup in office next week.

## 2016-01-24 NOTE — Telephone Encounter (Signed)
Alfred Dennis, daughter, (DPR on file) calling stating that over the past few weeks and more noticable over past few days has noticed that her father's legs are swollen.  (L) is worse with whole leg swollen; (R) leg is also swollen at ankles. No redness noted. She has been very strict with maintaining low sodium diet. She is giving him total of 1500 mg per day of sodium. He is taking Lasix 40 mg daily.  Lasix was decreased in late Sept from Lasix 40 mg twice a day to 40 mg daily due to low BP. Is taking KCL 10 meq daily. Has not weighted him or taken his BP recently. She moved in with her parents 8/31 to take care of them. (1) she would like to know if should increase Lasix (2) how much water should he be drinking a day Advised would forward to Dr. Radford Pax for recommendations.

## 2016-01-27 ENCOUNTER — Telehealth: Payer: Self-pay | Admitting: *Deleted

## 2016-01-27 NOTE — Telephone Encounter (Signed)
S/w pt's daughter per DPR is aware changed appointment date to the day of coumadin.  Pt's appointment was put in held slot for [facility] meeting.

## 2016-02-03 ENCOUNTER — Ambulatory Visit: Payer: Medicare Other | Admitting: Nurse Practitioner

## 2016-02-04 ENCOUNTER — Ambulatory Visit (INDEPENDENT_AMBULATORY_CARE_PROVIDER_SITE_OTHER): Payer: Medicare Other | Admitting: Nurse Practitioner

## 2016-02-04 ENCOUNTER — Ambulatory Visit (INDEPENDENT_AMBULATORY_CARE_PROVIDER_SITE_OTHER): Payer: Medicare Other | Admitting: *Deleted

## 2016-02-04 ENCOUNTER — Encounter: Payer: Self-pay | Admitting: Nurse Practitioner

## 2016-02-04 VITALS — BP 116/70 | Ht 63.0 in | Wt 139.1 lb

## 2016-02-04 DIAGNOSIS — R609 Edema, unspecified: Secondary | ICD-10-CM

## 2016-02-04 DIAGNOSIS — I2589 Other forms of chronic ischemic heart disease: Secondary | ICD-10-CM

## 2016-02-04 DIAGNOSIS — I059 Rheumatic mitral valve disease, unspecified: Secondary | ICD-10-CM | POA: Diagnosis not present

## 2016-02-04 DIAGNOSIS — Z952 Presence of prosthetic heart valve: Secondary | ICD-10-CM

## 2016-02-04 DIAGNOSIS — E7800 Pure hypercholesterolemia, unspecified: Secondary | ICD-10-CM

## 2016-02-04 DIAGNOSIS — I110 Hypertensive heart disease with heart failure: Secondary | ICD-10-CM | POA: Diagnosis not present

## 2016-02-04 DIAGNOSIS — I482 Chronic atrial fibrillation, unspecified: Secondary | ICD-10-CM

## 2016-02-04 DIAGNOSIS — Z5181 Encounter for therapeutic drug level monitoring: Secondary | ICD-10-CM

## 2016-02-04 DIAGNOSIS — I10 Essential (primary) hypertension: Secondary | ICD-10-CM

## 2016-02-04 DIAGNOSIS — I255 Ischemic cardiomyopathy: Secondary | ICD-10-CM

## 2016-02-04 LAB — PROTIME-INR
INR: 5.08
Prothrombin Time: 48.7 seconds — ABNORMAL HIGH (ref 11.4–15.2)

## 2016-02-04 LAB — POCT INR: INR: 7.6

## 2016-02-04 NOTE — Patient Instructions (Addendum)
We will be checking the following labs today - BMET, BNP and CBC  Coumadin check today   Medication Instructions:    Continue with your current medicines. BUT  I am increasing the Lasix to 60 mg to take twice a day - early morning and early afternoon.    Testing/Procedures To Be Arranged:  N/A  Follow-Up:   See me in about 2 weeks    Other Special Instructions:   Try wrapping the legs in ACE wraps  Keep restricting the salt.     If you need a refill on your cardiac medications before your next appointment, please call your pharmacy.   Call the [place] office at [phone] if you have any questions, problems or concerns.

## 2016-02-04 NOTE — Progress Notes (Signed)
CARDIOLOGY OFFICE NOTE  Date:  02/04/2016    Alfred Dennis Date of Birth: Dec 30, 1923 Medical Record [id number]  PCP:  Osborne Casco, MD  Cardiologist:  Radford Pax    Chief Complaint  Patient presents with  . Atrial Fibrillation  . Edema  . Cardiac Valve Problem    Follow up visit - seen for Dr. Radford Pax    History of Present Illness: Alfred Dennis is a 82 y.o. male who presents today for a 2 month check. Seen for Dr. Radford Pax.   He has a history of severe MR s/p MVR, HTN, dyslipidemia, chronic atrial fibrillation, CAD s/p CABG and ischemic dilated CM with EF 35-30% at time of his colon surgery and chronic systolic CHF. He has a history of VT but due to his age and comorbidities he was not felt to be a candidate for ICD.  He was last seen back in September. Felt to be doing ok but having some swelling in the right leg. Negative doppler. Remains on warfarin.   Comes in today. Here with his daughter (who I used to work with at [employer]) and his wife (who is demented). Still with lots of swelling issues. No longer on any BP medicines. EF is 15% per echo from March. He is pretty sedentary. His salt intake is less than 1500 mg. Swelling has gotten worse. Weight is up. He is short of breath with minimal activities. No real chest pain. Not dizzy. Appetite is good. No bloating. Has had a fall - hard to say when this was. Soon to turn 89.   Past Medical History:  Diagnosis Date  . Arthritis    "left foot; fingers" (02/03/2012)  . Bacteremia due to group B Streptococcus    a. 05/2015-->outpt abx x 6 wks complicated by C diff.  . Basal cell carcinoma of shoulder 10/2010  . C. difficile colitis    a. Admit 5/1 - 08/01/2015 - occurred in setting of outpt Abx for Group B Strep Bacteremia.  . Carcinoma of colon ([place])   . Chronic anticoagulation    a. coumadin.  . Chronic atrial fibrillation (HCC)    a. CHA2DS2VASc = 7-->chronic coumadin.  . Chronic systolic CHF (congestive  heart failure) ([place])    a. 05/2015 Echo: EF 15-20%, diff HK.  . CKD (chronic kidney disease), stage III   . Coronary artery disease    a. 05/2000 s/p CABG x 4 (LIMA to LAD, SVG to RI, SVG to OM).  Marland Kitchen GIB (gastrointestinal bleeding)   . Hyperlipidemia   . Hypertension   . Hypertensive heart disease   . Ischemic dilated cardiomyopathy ([place])    a. 01/2012 Echo: EF 25-30%;  b. 05/2015 Echo: EF 15-20%, mild MR, sev dil LA, mod dil RV w/ mod reduced RV fxn, mod dil RA, sev TR, mild PR, PASP 70mmHg.  . Mitral valvular regurgitation    a. 05/2000 s/p SJM 33 mm mechanical MV replacement;  b. 05/2015 Echo: mild MR.  . Non-sustained ventricular tachycardia ([place])   . Prediabetes   . Stroke [facility])    a. 05/2000 after CABG  . Ventricular tachycardia ([place])    a.not felt to be AICD candidate 2/2 advanced age/co-morbidities.    Past Surgical History:  Procedure Laterality Date  . CARDIAC VALVE REPLACEMENT  05/2000   St. Jude 64mm  . CATARACT EXTRACTION W/ INTRAOCULAR LENS  IMPLANT, BILATERAL Bilateral ~ 2011  . COLONOSCOPY  02/08/2012   Procedure: COLONOSCOPY;  Surgeon: Gatha Mayer,  MD;  Location: [facility];  Service: Endoscopy;  Laterality: N/A;  . CORONARY ARTERY BYPASS GRAFT     LIMA to LAD, SVG to IM, SVG to OM2  . EMBOLECTOMY Left 03/21/2014   Procedure: LEFT BRACHIAL EMBOLECTOMY;  Surgeon: Elam Dutch, MD;  Location: [facility];  Service: Vascular;  Laterality: Left;  . ESOPHAGOGASTRODUODENOSCOPY  02/08/2012   Procedure: ESOPHAGOGASTRODUODENOSCOPY (EGD);  Surgeon: Gatha Mayer, MD;  Location: [facility] ENDOSCOPY;  Service: Endoscopy;  Laterality: N/A;  . INGUINAL HERNIA REPAIR Right   . LAPAROSCOPIC PARTIAL COLECTOMY  02/10/2012   Procedure: LAPAROSCOPIC PARTIAL COLECTOMY;  Surgeon: Stark Klein, MD;  Location: [facility];  Service: General;  Laterality: N/A;  . VALVE REPLACEMENT  05/2000   St. Jude 38mm mechanical valve     Medications: Current Outpatient Prescriptions  Medication Sig Dispense Refill   . furosemide (LASIX) 40 MG tablet Take 60 mg by mouth 2 (two) times daily.    . Multiple Vitamin (MULTIVITAMIN WITH MINERALS) TABS Take 1 tablet by mouth daily.    . mupirocin ointment (BACTROBAN) 2 % APPLY TO AFFECTED AREA DAILY DURING DRESSING CHANGE  1  . potassium chloride (K-DUR) 10 MEQ tablet Take 1 tablet (10 mEq total) by mouth daily. 30 tablet 3  . simvastatin (ZOCOR) 10 MG tablet Take 10 mg by mouth at bedtime.    Marland Kitchen warfarin (COUMADIN) 2.5 MG tablet Take as directed by coumadin clinic 90 tablet 1   No current facility-administered medications for this visit.     Allergies: Allergies  Allergen Reactions  . Tape Other (See Comments)    CERTAIN BANDAGES CAN TEAR THE SKIN; PLEASE USE PAPER TAPE    Social History: The patient  reports that he quit smoking about 35 years ago. His smoking use included Cigarettes. He smoked 0.00 packs per day for 0.00 years. He has never used smokeless tobacco. He reports that he does not drink alcohol or use drugs.   Family History: The patient's family history includes Cancer - Prostate in his son; Heart disease in his mother and sister.   Review of Systems: Please see the history of present illness.   Otherwise, the review of systems is positive for none.   All other systems are reviewed and negative.   Physical Exam: VS:  BP 116/70   Ht 5\' 3"  (1.6 m)   Wt 139 lb 1.9 oz (63.1 kg)   BMI 24.64 kg/m  .  BMI Body mass index is 24.64 kg/m.  Wt Readings from Last 3 Encounters:  02/04/16 139 lb 1.9 oz (63.1 kg)  11/28/15 126 lb 2.6 oz (57.2 kg)  11/19/15 132 lb 6.4 oz (60.1 kg)    General: Elderly male who appears chronically ill but alert and in no acute distress.   HEENT: Normal.  Neck: Supple, no JVD, carotid bruits, or masses noted.  Cardiac: Irregular irregular rhythm. Rate is ok. Over 2+ edema bilaterally.  Respiratory:  Lungs are clear to auscultation bilaterally with normal work of breathing.  GI: Soft and nontender.  MS: No  deformity or atrophy. Gait and ROM intact.  Skin: Warm and dry. Color is normal.  Neuro:  Strength and sensation are intact and no gross focal deficits noted.  Psych: Alert, appropriate and with normal affect.   LABORATORY DATA:  EKG:  EKG is not ordered today.  Lab Results  Component Value Date   WBC 7.1 08/01/2015   HGB 11.7 (L) 08/01/2015   HCT 36.6 (L) 08/01/2015   PLT 123 (  L) 08/01/2015   GLUCOSE 111 (H) 12/02/2015   CHOL 101 (L) 12/02/2015   TRIG 80 12/02/2015   HDL 51 12/02/2015   LDLCALC 34 12/02/2015   ALT 33 12/02/2015   AST 49 (H) 12/02/2015   NA 141 12/02/2015   K 4.6 12/02/2015   CL 106 12/02/2015   CREATININE 1.39 (H) 12/02/2015   BUN 30 (H) 12/02/2015   CO2 26 12/02/2015   TSH 3.922 02/03/2012   INR 2.8 01/07/2016    BNP (last 3 results)  Recent Labs  06/05/15 1959  BNP 1,585.8*    ProBNP (last 3 results) No results for input(s): PROBNP in the last 8760 hours.   Other Studies Reviewed Today:  Echo Study Conclusions from 05/2015  - Left ventricle: The cavity size was moderately dilated. Systolic   function was severely reduced. The estimated ejection fraction   was in the range of 15-20%. Diffuse hypokinesis. The study is not   technically sufficient to allow evaluation of LV diastolic   function. - Ventricular septum: Septal motion showed paradox. - Aortic valve: There was no regurgitation. - Aortic root: The aortic root was normal in size. - Mitral valve: There was mild regurgitation. Valve area by   continuity equation (using LVOT flow): 1.44 cm^2. - Left atrium: The atrium was severely dilated. - Right ventricle: The cavity size was moderately dilated. Wall   thickness was normal. Systolic function was moderately reduced. - Right atrium: The atrium was moderately dilated. - Tricuspid valve: There was severe regurgitation. - Pulmonic valve: There was mild regurgitation. - Pulmonary arteries: Systolic pressure was severely increased.  PA   peak pressure: 55 mm Hg (S). - Inferior vena cava: The vessel was dilated. The respirophasic   diameter changes were blunted (< 50%), consistent with elevated   central venous pressure. - Pericardium, extracardiac: There was no pericardial effusion.  Impressions:  - Compared to the prior study from 03/12/2014 LVEF is now decreased   estimated at 15-20%, also moderately decreased RVEF.   There is now severe pulmonary hypertension.   Mechanical mitral valve has normal function.   Assessment/Plan:  1. Acute on chronic systolic HF - weight is up significantly. Volume overloaded on exam. Breathing seems to be at baseline. EF of only 15%. Suspect he is end stage. Will increase Lasix to 60 mg BID. Lab today. Not able to wear support stockings - daughter will try ACE wraps.   2. Chronic AF - on coumadin - rate controlled.  3. Ischemic CM - not on optimal therapy due to BP  4. CAD - no active angina that I can note  5. Prior mechanical MVR - on coumadin - INR today.   6. Advanced age - limited options.   Current medicines are reviewed with the patient today.  The patient does not have concerns regarding medicines other than what has been noted above.  The following changes have been made:  See above.  Labs/ tests ordered today include:    Orders Placed This Encounter  Procedures  . Brain natriuretic peptide  . Basic metabolic panel  . CBC  . Hepatic function panel     Disposition:   FU with me in 2 weeks.    Patient is agreeable to this plan and will call if any problems develop in the interim.   Signed: Burtis Junes, RN, ANP-C 02/04/2016 10:39 AM  [facility] [address] [place], [place]  [address] Phone: [phone] Fax: [phone]        

## 2016-02-11 ENCOUNTER — Encounter: Payer: Self-pay | Admitting: Cardiology

## 2016-02-11 ENCOUNTER — Ambulatory Visit (INDEPENDENT_AMBULATORY_CARE_PROVIDER_SITE_OTHER): Payer: Medicare Other | Admitting: Cardiology

## 2016-02-11 ENCOUNTER — Ambulatory Visit (INDEPENDENT_AMBULATORY_CARE_PROVIDER_SITE_OTHER): Payer: Medicare Other | Admitting: *Deleted

## 2016-02-11 VITALS — BP 118/62 | HR 61 | Ht 63.0 in | Wt 139.0 lb

## 2016-02-11 DIAGNOSIS — Z79899 Other long term (current) drug therapy: Secondary | ICD-10-CM

## 2016-02-11 DIAGNOSIS — I059 Rheumatic mitral valve disease, unspecified: Secondary | ICD-10-CM

## 2016-02-11 DIAGNOSIS — I255 Ischemic cardiomyopathy: Secondary | ICD-10-CM

## 2016-02-11 DIAGNOSIS — Z952 Presence of prosthetic heart valve: Secondary | ICD-10-CM

## 2016-02-11 DIAGNOSIS — I482 Chronic atrial fibrillation, unspecified: Secondary | ICD-10-CM

## 2016-02-11 DIAGNOSIS — I2583 Coronary atherosclerosis due to lipid rich plaque: Secondary | ICD-10-CM

## 2016-02-11 DIAGNOSIS — R609 Edema, unspecified: Secondary | ICD-10-CM | POA: Diagnosis not present

## 2016-02-11 DIAGNOSIS — I110 Hypertensive heart disease with heart failure: Secondary | ICD-10-CM | POA: Diagnosis not present

## 2016-02-11 DIAGNOSIS — L03115 Cellulitis of right lower limb: Secondary | ICD-10-CM | POA: Diagnosis not present

## 2016-02-11 DIAGNOSIS — Z5181 Encounter for therapeutic drug level monitoring: Secondary | ICD-10-CM

## 2016-02-11 DIAGNOSIS — I420 Dilated cardiomyopathy: Secondary | ICD-10-CM

## 2016-02-11 DIAGNOSIS — I2510 Atherosclerotic heart disease of native coronary artery without angina pectoris: Secondary | ICD-10-CM

## 2016-02-11 LAB — CBC WITH DIFFERENTIAL/PLATELET
BASOS PCT: 1 %
Basophils Absolute: 61 cells/uL (ref 0–200)
EOS PCT: 1 %
Eosinophils Absolute: 61 cells/uL (ref 15–500)
HCT: 39 % (ref 38.5–50.0)
Hemoglobin: 12.5 g/dL — ABNORMAL LOW (ref 13.2–17.1)
LYMPHS PCT: 8 %
Lymphs Abs: 488 cells/uL — ABNORMAL LOW (ref 850–3900)
MCH: 30.2 pg (ref 27.0–33.0)
MCHC: 32.1 g/dL (ref 32.0–36.0)
MCV: 94.2 fL (ref 80.0–100.0)
MONOS PCT: 13 %
MPV: 12 fL (ref 7.5–12.5)
Monocytes Absolute: 793 cells/uL (ref 200–950)
Neutro Abs: 4697 cells/uL (ref 1500–7800)
Neutrophils Relative %: 77 %
PLATELETS: 156 10*3/uL (ref 140–400)
RBC: 4.14 MIL/uL — AB (ref 4.20–5.80)
RDW: 14.9 % (ref 11.0–15.0)
WBC: 6.1 10*3/uL (ref 3.8–10.8)

## 2016-02-11 LAB — BASIC METABOLIC PANEL
BUN: 40 mg/dL — ABNORMAL HIGH (ref 7–25)
CALCIUM: 9.1 mg/dL (ref 8.6–10.3)
CHLORIDE: 106 mmol/L (ref 98–110)
CO2: 26 mmol/L (ref 20–31)
CREATININE: 1.54 mg/dL — AB (ref 0.70–1.11)
Glucose, Bld: 70 mg/dL (ref 65–99)
Potassium: 4.4 mmol/L (ref 3.5–5.3)
Sodium: 141 mmol/L (ref 135–146)

## 2016-02-11 LAB — HEPATIC FUNCTION PANEL
ALBUMIN: 3.7 g/dL (ref 3.6–5.1)
ALT: 22 U/L (ref 9–46)
AST: 32 U/L (ref 10–35)
Alkaline Phosphatase: 87 U/L (ref 40–115)
BILIRUBIN TOTAL: 1 mg/dL (ref 0.2–1.2)
Bilirubin, Direct: 0.4 mg/dL — ABNORMAL HIGH (ref <=0.2)
Indirect Bilirubin: 0.6 mg/dL (ref 0.2–1.2)
TOTAL PROTEIN: 5.7 g/dL — AB (ref 6.1–8.1)

## 2016-02-11 LAB — BRAIN NATRIURETIC PEPTIDE: BRAIN NATRIURETIC PEPTIDE: 2431.3 pg/mL — AB (ref <100)

## 2016-02-11 LAB — POCT INR: INR: 5.5

## 2016-02-11 MED ORDER — CEPHALEXIN 250 MG PO CAPS: 250.0000 mg | ORAL_CAPSULE | Freq: Three times a day (TID) | ORAL | 0 refills | Status: DC

## 2016-02-11 MED ORDER — METOLAZONE 2.5 MG PO TABS: ORAL_TABLET | ORAL | 3 refills | Status: DC

## 2016-02-11 NOTE — Patient Instructions (Addendum)
Medication Instructions:  Your physician has recommended you make the following change in your medication:  1-START Metolazone 2.5 mg by mouth daily before taking your Lasix (furosemide) 1-START Keflex 250 mg by mouth three times daily for 7 days- Make sure you do not stop taking even if your right leg looks better.  Labwork: Your physician recommends that you have lab work today- BMET, CBC, INR/PT, BNP and liver panel   Testing/Procedures: NONE  Follow-Up: Keep your appointment with Truitt Merle NP next week.   If you need a refill on your cardiac medications before your next appointment, please call your pharmacy.

## 2016-02-11 NOTE — Progress Notes (Signed)
Cardiology Office Note   Date:  02/11/2016   ID:  Alfred Dennis, DOB 1924-03-02, MRN [id number]  PCP:  Osborne Casco, MD  Cardiologist:  Dr. Radford Pax    Chief Complaint  Patient presents with  . Shortness of Breath    pt states sometimes       History of Present Illness: Alfred Dennis is a 82 y.o. male who presents for edema.   He has a history of severe MR s/p MVR, HTN, dyslipidemia, chronic atrial fibrillation, CAD s/p CABG and ischemic dilated CM with EF 35-30% at time of his colon surgery and chronic systolic CHF. He has a history of VT but due to his age and comorbidities he was not felt to be a candidate for ICD. Visit 02/04/16 with No longer on any BP medicines. EF is 15% per echo from March. He is pretty sedentary. His salt intake is less than 1500 mg. Swelling has gotten worse. Weight is up. He is short of breath with minimal activities. No real chest pain. Not dizzy. Appetite is good. No bloating.  Lasix was increased to 60 mg BID-- his INR was 5.08 same day.    Today edema of legs mildly improved per daughter.  Though now with redness of Rt leg and drainage.  Changing dressing twice a day.  He did not have labs at last visit due to INR of 5.08.  To be rechecked today and we will also check labs.  He has mild chest discomfort he has had for years.     Past Medical History:  Diagnosis Date  . Arthritis    "left foot; fingers" (02/03/2012)  . Bacteremia due to group B Streptococcus    a. 05/2015-->outpt abx x 6 wks complicated by C diff.  . Basal cell carcinoma of shoulder 10/2010  . C. difficile colitis    a. Admit 5/1 - 08/01/2015 - occurred in setting of outpt Abx for Group B Strep Bacteremia.  . Carcinoma of colon ([place])   . Chronic anticoagulation    a. coumadin.  . Chronic atrial fibrillation (HCC)    a. CHA2DS2VASc = 7-->chronic coumadin.  . Chronic systolic CHF (congestive heart failure) ([place])    a. 05/2015 Echo: EF 15-20%, diff HK.  . CKD  (chronic kidney disease), stage III   . Coronary artery disease    a. 05/2000 s/p CABG x 4 (LIMA to LAD, SVG to RI, SVG to OM).  Marland Kitchen GIB (gastrointestinal bleeding)   . Hyperlipidemia   . Hypertension   . Hypertensive heart disease   . Ischemic dilated cardiomyopathy ([place])    a. 01/2012 Echo: EF 25-30%;  b. 05/2015 Echo: EF 15-20%, mild MR, sev dil LA, mod dil RV w/ mod reduced RV fxn, mod dil RA, sev TR, mild PR, PASP 33mmHg.  . Mitral valvular regurgitation    a. 05/2000 s/p SJM 33 mm mechanical MV replacement;  b. 05/2015 Echo: mild MR.  . Non-sustained ventricular tachycardia ([place])   . Prediabetes   . Stroke [facility])    a. 05/2000 after CABG  . Ventricular tachycardia ([place])    a.not felt to be AICD candidate 2/2 advanced age/co-morbidities.    Past Surgical History:  Procedure Laterality Date  . CARDIAC VALVE REPLACEMENT  05/2000   St. Jude 75mm  . CATARACT EXTRACTION W/ INTRAOCULAR LENS  IMPLANT, BILATERAL Bilateral ~ 2011  . COLONOSCOPY  02/08/2012   Procedure: COLONOSCOPY;  Surgeon: Gatha Mayer, MD;  Location: [place] [facility];  Service: Endoscopy;  Laterality: N/A;  . CORONARY ARTERY BYPASS GRAFT     LIMA to LAD, SVG to IM, SVG to OM2  . EMBOLECTOMY Left 03/21/2014   Procedure: LEFT BRACHIAL EMBOLECTOMY;  Surgeon: Elam Dutch, MD;  Location: [facility];  Service: Vascular;  Laterality: Left;  . ESOPHAGOGASTRODUODENOSCOPY  02/08/2012   Procedure: ESOPHAGOGASTRODUODENOSCOPY (EGD);  Surgeon: Gatha Mayer, MD;  [facility] ENDOSCOPY;  Service: Endoscopy;  Laterality: N/A;  . INGUINAL HERNIA REPAIR Right   . LAPAROSCOPIC PARTIAL COLECTOMY  02/10/2012   Procedure: LAPAROSCOPIC PARTIAL COLECTOMY;  Surgeon: Stark Klein, MD;  Location: [facility];  Service: General;  Laterality: N/A;  . VALVE REPLACEMENT  05/2000   St. Jude 13mm mechanical valve     Current Outpatient Prescriptions  Medication Sig Dispense Refill  . furosemide (LASIX) 40 MG tablet Take 60 mg by mouth 2 (two) times daily.    .  Multiple Vitamin (MULTIVITAMIN WITH MINERALS) TABS Take 1 tablet by mouth daily.    . mupirocin ointment (BACTROBAN) 2 % APPLY TO AFFECTED AREA DAILY DURING DRESSING CHANGE  1  . potassium chloride (K-DUR) 10 MEQ tablet Take 1 tablet (10 mEq total) by mouth daily. 30 tablet 3  . simvastatin (ZOCOR) 10 MG tablet Take 10 mg by mouth at bedtime.    Marland Kitchen warfarin (COUMADIN) 2.5 MG tablet Take as directed by coumadin clinic 90 tablet 1   No current facility-administered medications for this visit.     Allergies:   Tape    Social History:  The patient  reports that he quit smoking about 35 years ago. His smoking use included Cigarettes. He smoked 0.00 packs per day for 0.00 years. He has never used smokeless tobacco. He reports that he does not drink alcohol or use drugs.   Family History:  The patient's family history includes Cancer - Prostate in his son; Heart disease in his mother and sister.    ROS:  General:no colds or fevers, no weight changes Skin:no rashes + ulcers on Lt foot and now with cellulitis of RT lower ext with edema and serous drainage. Skin cancer of face, followed by PCP.  HEENT:no blurred vision, no congestion CV:see HPI PUL:see HPI GI:no diarrhea constipation or melena, no indigestion GU:no hematuria, no dysuria MS:no joint pain, no claudication Neuro:no syncope, no lightheadedness Endo:no diabetes, no thyroid disease  Wt Readings from Last 3 Encounters:  02/11/16 139 lb (63 kg)  02/04/16 139 lb 1.9 oz (63.1 kg)  11/28/15 126 lb 2.6 oz (57.2 kg)     PHYSICAL EXAM: VS:  BP 118/62   Pulse 61   Ht 5\' 3"  (1.6 m)   Wt 139 lb (63 kg)   SpO2 (!) 89%   BMI 24.62 kg/m  , BMI Body mass index is 24.62 kg/m. General:Pleasant affect, NAD Skin:Warm and dry, brisk capillary refill, skin cancer on Rt cheek.  HEENT:normocephalic, sclera clear, mucus membranes moist Neck:supple, no JVD in upright position. , no bruits  Heart:irreg irreg  With [id number] systolic murmur, no  gallup, rub or click Lungs:clear without rales, rhonchi, or wheezes [id number], non tender, + BS, do not palpate liver spleen or masses Ext:2-3+ lower ext edema up into thighs, 2+ radial pulses  Lt leg with reddened area, possible bites and reddened areas and Rt lower ext with serous drainage reddened area - I unwrapped part of leg -superficial cellulitis.  Neuro:alert and oriented X 3, MAE, follows commands, + facial symmetry    EKG:  EKG is NOT ordered today.  Recent Labs: 06/05/2015: B Natriuretic Peptide 1,585.8 06/09/2015: Magnesium 2.5 08/01/2015: Hemoglobin 11.7; Platelets 123 12/02/2015: ALT 33; BUN 30; Creat 1.39; Potassium 4.6; Sodium 141    Lipid Panel    Component Value Date/Time   CHOL 101 (L) 12/02/2015 0835   TRIG 80 12/02/2015 0835   HDL 51 12/02/2015 0835   CHOLHDL 2.0 12/02/2015 0835   VLDL 16 12/02/2015 0835   LDLCALC 34 12/02/2015 0835       Other studies Reviewed: Additional studies/ records that were reviewed today include: . Echo: 06/06/15  Study Conclusions  - Left ventricle: The cavity size was moderately dilated. Systolic   function was severely reduced. The estimated ejection fraction   was in the range of 15-20%. Diffuse hypokinesis. The study is not   technically sufficient to allow evaluation of LV diastolic   function. - Ventricular septum: Septal motion showed paradox. - Aortic valve: There was no regurgitation. - Aortic root: The aortic root was normal in size. - Mitral valve: There was mild regurgitation. Valve area by   continuity equation (using LVOT flow): 1.44 cm^2. - Left atrium: The atrium was severely dilated. - Right ventricle: The cavity size was moderately dilated. Wall   thickness was normal. Systolic function was moderately reduced. - Right atrium: The atrium was moderately dilated. - Tricuspid valve: There was severe regurgitation. - Pulmonic valve: There was mild regurgitation. - Pulmonary arteries: Systolic pressure was  severely increased. PA   peak pressure: 55 mm Hg (S). - Inferior vena cava: The vessel was dilated. The respirophasic   diameter changes were blunted (< 50%), consistent with elevated   central venous pressure. - Pericardium, extracardiac: There was no pericardial effusion.  Impressions:  - Compared to the prior study from 03/12/2014 LVEF is now decreased   estimated at 15-20%, also moderately decreased RVEF.   There is now severe pulmonary hypertension.   Mechanical mitral valve has normal function.  ASSESSMENT AND PLAN:   1. Acute on chronic systolic HF - weight is up significantly. Volume overloaded on exam. I agree with Cecille Rubin breathing seems to be at baseline. EF of only 15%. Suspect he is end stage.Lasix to 60 mg BID. Lab today not done last week due to INR of 5.   Not able to wear support stockings - daughter is using  ACE wraps.  Mild improvement in edema per daughter.  Wt is the same.  Will add 2.5 mg metolazone prior to AM lasix.  If no improvement may need to increase the metolazone to 5 mg.  This also depends on K+ and Cr today.   He follows low salt diet.  His daughter has moved in with her parents and monitors them closely.    Keep follow up next week.   2. Cellulitis of lower ext.  With drainage of fluid.  Will add keflex 250 mg TID for 7 days and continue with leg wraps TID using neosporin.   No pain  3. Chronic AF - on coumadin - rate controlled.  4. Ischemic CM - not on optimal therapy due to BP  5. CAD - no active angina that I can note he has chest pain since valve surgery that comes and goes and is no different.    6. Prior mechanical MVR - on coumadin - INR today.  Last week 5.08  7. Advanced age - limited options. He is able to ambulate but I am going slow to prevent weakness.  8. Skin cancer followed by PCP  To  see him within a month    Current medicines are reviewed with the patient today.  The patient Has no concerns regarding medicines.  The  following changes have been made:  See above Labs/ tests ordered today include:see above  Disposition:   FU:  see above  Signed, Cecilie Kicks, NP  02/11/2016 10:58 AM    [facility] [place], [place], [place], [place] Phone: [phone]; Fax: [phone]

## 2016-02-12 ENCOUNTER — Telehealth: Payer: Self-pay | Admitting: Cardiology

## 2016-02-12 NOTE — Telephone Encounter (Signed)
New Message  Pt call requesting to speak with RN. Pt daughter states pt was prescribed an antibiotic from [facility] appt on 12/5. Pt daughter states pt has C diff and may need to be prescribed a different antibiotic. Please call back to discuss

## 2016-02-12 NOTE — Telephone Encounter (Signed)
Pt daughter called.  She is concerned with Keflex.  It has a precaution that it could cause CDiff and pt has had it before.  Pt daughter doesn't want to have to go thru that again.  She said the pharmacist mentioned to her about possibly the pt taking Florastor with the ABX to help keep him from gettting CDIFF.  What do you advise!

## 2016-02-12 NOTE — Telephone Encounter (Signed)
Can take the florastar and only take keflex for 3 days.

## 2016-02-13 DIAGNOSIS — L97512 Non-pressure chronic ulcer of other part of right foot with fat layer exposed: Secondary | ICD-10-CM | POA: Diagnosis not present

## 2016-02-13 NOTE — Telephone Encounter (Signed)
Returned call to pts daughter, [name], [place] on file. She has been advised of pts labs results and that she can just give pt 3 days of antibiotic instead of 7 and he can take Florastor with it.

## 2016-02-13 NOTE — Telephone Encounter (Signed)
-----   Message from Isaiah Serge, NP sent at 02/12/2016 10:21 PM EST ----- Labs do show heart failure.  His daughter had asked about keflex if legs do not become more red then could hold antibiotic, otherwise I said to take the probiotic and only take keflex for 3 days.  His white count is not elevated.

## 2016-02-13 NOTE — Telephone Encounter (Signed)
Called pt daughter, [name], back.  Left her a message for her to call back re: pts labs and abx.

## 2016-02-13 NOTE — Telephone Encounter (Signed)
F/u message  Pt daughter RN call. Please call back to discuss

## 2016-02-18 ENCOUNTER — Encounter: Payer: Self-pay | Admitting: Nurse Practitioner

## 2016-02-18 ENCOUNTER — Ambulatory Visit (INDEPENDENT_AMBULATORY_CARE_PROVIDER_SITE_OTHER): Payer: Medicare Other | Admitting: Nurse Practitioner

## 2016-02-18 ENCOUNTER — Ambulatory Visit (INDEPENDENT_AMBULATORY_CARE_PROVIDER_SITE_OTHER): Payer: Medicare Other | Admitting: *Deleted

## 2016-02-18 VITALS — BP 102/56 | Ht 63.0 in | Wt 128.0 lb

## 2016-02-18 DIAGNOSIS — I5022 Chronic systolic (congestive) heart failure: Secondary | ICD-10-CM

## 2016-02-18 DIAGNOSIS — I482 Chronic atrial fibrillation, unspecified: Secondary | ICD-10-CM

## 2016-02-18 DIAGNOSIS — I059 Rheumatic mitral valve disease, unspecified: Secondary | ICD-10-CM | POA: Diagnosis not present

## 2016-02-18 DIAGNOSIS — I255 Ischemic cardiomyopathy: Secondary | ICD-10-CM | POA: Diagnosis not present

## 2016-02-18 DIAGNOSIS — I420 Dilated cardiomyopathy: Secondary | ICD-10-CM | POA: Diagnosis not present

## 2016-02-18 DIAGNOSIS — Z952 Presence of prosthetic heart valve: Secondary | ICD-10-CM | POA: Diagnosis not present

## 2016-02-18 DIAGNOSIS — I2589 Other forms of chronic ischemic heart disease: Secondary | ICD-10-CM

## 2016-02-18 DIAGNOSIS — Z5181 Encounter for therapeutic drug level monitoring: Secondary | ICD-10-CM

## 2016-02-18 LAB — POCT INR: INR: 3.5

## 2016-02-18 NOTE — Patient Instructions (Addendum)
We will be checking the following labs today -  BMET and BNP  INR today   Medication Instructions:    Continue with your current medicines. BUT  STOP Zaroxolyn for now - I will tell you when to start back - probably just twice a week    Testing/Procedures To Be Arranged:  N/A  Follow-Up:   See me in about 3 weeks.     Other Special Instructions:   N/A    If you need a refill on your cardiac medications before your next appointment, please call your pharmacy.   Call the [place] office at [phone] if you have any questions, problems or concerns.

## 2016-02-18 NOTE — Progress Notes (Signed)
CARDIOLOGY OFFICE NOTE  Date:  02/18/2016    Alfred Dennis Date of Birth: [birth year] Medical Record [id number]  PCP:  Osborne Casco, MD  Cardiologist:  Rosanne Sack   Chief Complaint  Patient presents with  . Congestive Heart Failure    Follow up visit - seen for Dr. Radford Pax    History of Present Illness: Alfred Dennis is a 82 y.o. male who presents today for a follow up visit. Seen for Dr. Radford Pax.   He has a history of severe MR s/p MVR, HTN, dyslipidemia, chronic atrial fibrillation, CAD s/p CABG and ischemic dilated CM with EF 35-30% at time of his colon surgery and chronic systolic CHF. He has a history of VT but due to his age and comorbidities he was not felt to be a candidate for ICD.  I saw him at the end of November - no longer on any BP medicines due to low BP. EF 15% by echo from March. Weight up. Lasix increased.   Saw Mickel Baas last week for a recheck - still with significant volume overload - started on low dose Zaroxolyn. Legs weeping. Daughter was wrapping with ACE wraps. Short course of Keflex given as well. He is felt to be end stage.     Comes in today. Here with his daughter Alfred Dennis) and wife. Wife is demented. He has just turned 89. Using a walker. He has had significant diuresis with the Zaroxolyn but with more fatigue - actually fell trying to go the bathroom. Legs still swollen - not as bad - still with some redness. Alfred Dennis is wrapping the legs - using neosporin as well. She restricts his sodium. No chest pain.   Past Medical History:  Diagnosis Date  . Arthritis    "left foot; fingers" (02/03/2012)  . Bacteremia due to group B Streptococcus    a. 05/2015-->outpt abx x 6 wks complicated by C diff.  . Basal cell carcinoma of shoulder 10/2010  . C. difficile colitis    a. Admit 5/1 - 08/01/2015 - occurred in setting of outpt Abx for Group B Strep Bacteremia.  . Carcinoma of colon ([place])   . Chronic anticoagulation    a. coumadin.  .  Chronic atrial fibrillation (HCC)    a. CHA2DS2VASc = 7-->chronic coumadin.  . Chronic systolic CHF (congestive heart failure) ([place])    a. 05/2015 Echo: EF 15-20%, diff HK.  . CKD (chronic kidney disease), stage III   . Coronary artery disease    a. 05/2000 s/p CABG x 4 (LIMA to LAD, SVG to RI, SVG to OM).  Marland Kitchen GIB (gastrointestinal bleeding)   . Hyperlipidemia   . Hypertension   . Hypertensive heart disease   . Ischemic dilated cardiomyopathy ([place])    a. 01/2012 Echo: EF 25-30%;  b. 05/2015 Echo: EF 15-20%, mild MR, sev dil LA, mod dil RV w/ mod reduced RV fxn, mod dil RA, sev TR, mild PR, PASP 56mmHg.  . Mitral valvular regurgitation    a. 05/2000 s/p SJM 33 mm mechanical MV replacement;  b. 05/2015 Echo: mild MR.  . Non-sustained ventricular tachycardia ([place])   . Prediabetes   . Stroke [facility])    a. 05/2000 after CABG  . Ventricular tachycardia ([place])    a.not felt to be AICD candidate 2/2 advanced age/co-morbidities.    Past Surgical History:  Procedure Laterality Date  . CARDIAC VALVE REPLACEMENT  05/2000   St. Jude 1mm  . CATARACT EXTRACTION W/ INTRAOCULAR LENS  IMPLANT, BILATERAL Bilateral ~ 2011  . COLONOSCOPY  02/08/2012   Procedure: COLONOSCOPY;  Surgeon: Gatha Mayer, MD;  Location: [place];  Service: Endoscopy;  Laterality: N/A;  . CORONARY ARTERY BYPASS GRAFT     LIMA to LAD, SVG to IM, SVG to OM2  . EMBOLECTOMY Left 03/21/2014   Procedure: LEFT BRACHIAL EMBOLECTOMY;  Surgeon: Elam Dutch, MD;  Location: [place];  Service: Vascular;  Laterality: Left;  . ESOPHAGOGASTRODUODENOSCOPY  02/08/2012   Procedure: ESOPHAGOGASTRODUODENOSCOPY (EGD);  Surgeon: Gatha Mayer, MD;  Location: [facility] ENDOSCOPY;  Service: Endoscopy;  Laterality: N/A;  . INGUINAL HERNIA REPAIR Right   . LAPAROSCOPIC PARTIAL COLECTOMY  02/10/2012   Procedure: LAPAROSCOPIC PARTIAL COLECTOMY;  Surgeon: Stark Klein, MD;  Location: [place];  Service: General;  Laterality: N/A;  . VALVE REPLACEMENT  05/2000   St.  Jude 12mm mechanical valve     Medications: Current Outpatient Prescriptions  Medication Sig Dispense Refill  . saccharomyces boulardii (FLORASTOR) 250 MG capsule Take 250 mg by mouth 2 (two) times daily.    . furosemide (LASIX) 40 MG tablet Take 60 mg by mouth 2 (two) times daily.    . metolazone (ZAROXOLYN) 2.5 MG tablet Take one tablet by mouth daily before taking morning dose of Lasix (furosemide) 90 tablet 3  . Multiple Vitamin (MULTIVITAMIN WITH MINERALS) TABS Take 1 tablet by mouth daily.    . mupirocin ointment (BACTROBAN) 2 % APPLY TO AFFECTED AREA DAILY DURING DRESSING CHANGE  1  . potassium chloride (K-DUR) 10 MEQ tablet Take 1 tablet (10 mEq total) by mouth daily. 30 tablet 3  . simvastatin (ZOCOR) 10 MG tablet Take 10 mg by mouth at bedtime.    Marland Kitchen warfarin (COUMADIN) 2.5 MG tablet Take as directed by coumadin clinic 90 tablet 1   No current facility-administered medications for this visit.     Allergies: Allergies  Allergen Reactions  . Tape Other (See Comments)    CERTAIN BANDAGES CAN TEAR THE SKIN; PLEASE USE PAPER TAPE    Social History: The patient  reports that he quit smoking about 35 years ago. His smoking use included Cigarettes. He smoked 0.00 packs per day for 0.00 years. He has never used smokeless tobacco. He reports that he does not drink alcohol or use drugs.   Family History: The patient's family history includes Cancer - Prostate in his son; Heart disease in his mother and sister.   Review of Systems: Please see the history of present illness.   Otherwise, the review of systems is positive for none.   All other systems are reviewed and negative.   Physical Exam: VS:  BP (!) 102/56   Ht 5\' 3"  (1.6 m)   Wt 128 lb (58.1 kg)   BMI 22.67 kg/m  .  BMI Body mass index is 22.67 kg/m.  Wt Readings from Last 3 Encounters:  02/18/16 128 lb (58.1 kg)  02/11/16 139 lb (63 kg)  02/04/16 139 lb 1.9 oz (63.1 kg)    General: Elderly. Chronically ill  appearing but alert and in no acute distress.  His weight is down 11 pounds. Weight was 126 in September.  HEENT: Normal.  Neck: Supple, no JVD, carotid bruits, or masses noted.  Cardiac: Irregular irregular rhythm. Rate is ok. Outflow murmur. Legs with decreased  Edema but still present.  Respiratory:  Lungs are clear to auscultation bilaterally with normal work of breathing.  GI: Soft and nontender.  MS: No deformity or atrophy. Gait and ROM  intact. He is using a walker.  Skin: Warm and dry. Color is normal.  Neuro:  Strength and sensation are intact and no gross focal deficits noted.  Psych: Alert, appropriate and with normal affect.   LABORATORY DATA:  EKG:  EKG is not ordered today.  Lab Results  Component Value Date   WBC 6.1 02/11/2016   HGB 12.5 (L) 02/11/2016   HCT 39.0 02/11/2016   PLT 156 02/11/2016   GLUCOSE 70 02/11/2016   CHOL 101 (L) 12/02/2015   TRIG 80 12/02/2015   HDL 51 12/02/2015   LDLCALC 34 12/02/2015   ALT 22 02/11/2016   AST 32 02/11/2016   NA 141 02/11/2016   K 4.4 02/11/2016   CL 106 02/11/2016   CREATININE 1.54 (H) 02/11/2016   BUN 40 (H) 02/11/2016   CO2 26 02/11/2016   TSH 3.922 02/03/2012   INR 5.5 02/11/2016    BNP (last 3 results)  Recent Labs  06/05/15 1959 02/11/16 1200  BNP 1,585.8* 2,431.3*    ProBNP (last 3 results) No results for input(s): PROBNP in the last 8760 hours.   Other Studies Reviewed Today:  Echo Study Conclusions from 05/2015  - Left ventricle: The cavity size was moderately dilated. Systolic   function was severely reduced. The estimated ejection fraction   was in the range of 15-20%. Diffuse hypokinesis. The study is not   technically sufficient to allow evaluation of LV diastolic   function. - Ventricular septum: Septal motion showed paradox. - Aortic valve: There was no regurgitation. - Aortic root: The aortic root was normal in size. - Mitral valve: There was mild regurgitation. Valve area by    continuity equation (using LVOT flow): 1.44 cm^2. - Left atrium: The atrium was severely dilated. - Right ventricle: The cavity size was moderately dilated. Wall   thickness was normal. Systolic function was moderately reduced. - Right atrium: The atrium was moderately dilated. - Tricuspid valve: There was severe regurgitation. - Pulmonic valve: There was mild regurgitation. - Pulmonary arteries: Systolic pressure was severely increased. PA   peak pressure: 55 mm Hg (S). - Inferior vena cava: The vessel was dilated. The respirophasic   diameter changes were blunted (< 50%), consistent with elevated   central venous pressure. - Pericardium, extracardiac: There was no pericardial effusion.  Impressions:  - Compared to the prior study from 03/12/2014 LVEF is now decreased   estimated at 15-20%, also moderately decreased RVEF.   There is now severe pulmonary hypertension.   Mechanical mitral valve has normal function.   Assessment/Plan: 1. Acute on chronic systolic HF - has had more issues with volume overloaded recently.  EF of only 15%. Suspect he is end stage.  Recheck lab today - probably need to add back the Zaroxolyn with twice a week dosing.   2. Cellulitis of lower extremities - short course of Keflex given last week.   3. Chronic AF - on coumadin - rate controlled.  4. Ischemic CM - not on optimal therapy due to BP - overall prognosis very poor  5. CAD - no active angina that I can note he has chest pain since valve surgery that comes and goes and is no different.    6. Prior mechanical MVR - on coumadin - INR today.   7. Advanced age - limited options.   Current medicines are reviewed with the patient today.  The patient does not have concerns regarding medicines other than what has been noted above.  The following changes  have been made:  See above.  Labs/ tests ordered today include:    Orders Placed This Encounter  Procedures  . Basic metabolic panel  .  Brain natriuretic peptide     Disposition:   FU with me after Christmas.   Patient is agreeable to this plan and will call if any problems develop in the interim.   Signed: Burtis Junes, RN, ANP-C 02/18/2016 11:32 AM  [facility] [address] [place], [place]  [address] Phone: [phone] Fax: [phone]

## 2016-02-19 ENCOUNTER — Other Ambulatory Visit: Payer: Self-pay | Admitting: *Deleted

## 2016-02-19 DIAGNOSIS — E876 Hypokalemia: Secondary | ICD-10-CM

## 2016-02-19 LAB — BASIC METABOLIC PANEL
BUN: 45 mg/dL — ABNORMAL HIGH (ref 7–25)
CO2: 35 mmol/L — ABNORMAL HIGH (ref 20–31)
Calcium: 9.4 mg/dL (ref 8.6–10.3)
Chloride: 95 mmol/L — ABNORMAL LOW (ref 98–110)
Creat: 1.61 mg/dL — ABNORMAL HIGH (ref 0.70–1.11)
Glucose, Bld: 68 mg/dL (ref 65–99)
Potassium: 2.9 mmol/L — ABNORMAL LOW (ref 3.5–5.3)
Sodium: 139 mmol/L (ref 135–146)

## 2016-02-19 LAB — BRAIN NATRIURETIC PEPTIDE: Brain Natriuretic Peptide: 1203.3 pg/mL — ABNORMAL HIGH (ref <100)

## 2016-02-19 MED ORDER — POTASSIUM CHLORIDE ER 10 MEQ PO TBCR: 10.0000 meq | EXTENDED_RELEASE_TABLET | Freq: Two times a day (BID) | ORAL | 3 refills | Status: DC

## 2016-02-21 ENCOUNTER — Other Ambulatory Visit: Payer: Medicare Other | Admitting: *Deleted

## 2016-02-21 DIAGNOSIS — E876 Hypokalemia: Secondary | ICD-10-CM

## 2016-02-21 LAB — BASIC METABOLIC PANEL
BUN: 53 mg/dL — ABNORMAL HIGH (ref 7–25)
CO2: 37 mmol/L — ABNORMAL HIGH (ref 20–31)
Calcium: 9.3 mg/dL (ref 8.6–10.3)
Chloride: 94 mmol/L — ABNORMAL LOW (ref 98–110)
Creat: 1.62 mg/dL — ABNORMAL HIGH (ref 0.70–1.11)
Glucose, Bld: 103 mg/dL — ABNORMAL HIGH (ref 65–99)
Potassium: 3.2 mmol/L — ABNORMAL LOW (ref 3.5–5.3)
Sodium: 139 mmol/L (ref 135–146)

## 2016-02-24 ENCOUNTER — Other Ambulatory Visit: Payer: Self-pay | Admitting: *Deleted

## 2016-02-24 DIAGNOSIS — E876 Hypokalemia: Secondary | ICD-10-CM

## 2016-02-24 MED ORDER — POTASSIUM CHLORIDE ER 10 MEQ PO TBCR: 10.0000 meq | EXTENDED_RELEASE_TABLET | Freq: Three times a day (TID) | ORAL | 3 refills | Status: AC

## 2016-02-25 ENCOUNTER — Telehealth: Payer: Self-pay | Admitting: Nurse Practitioner

## 2016-02-25 NOTE — Telephone Encounter (Signed)
New Message:    Please call Karen,question about pt's medicine and blood work.

## 2016-02-25 NOTE — Telephone Encounter (Signed)
?   Lost control of his bowels?? Could not pass thru bladder.   Most likely it was the shell/casing of the tablet - his labs did show his potassium to be improving so he is absorbing.

## 2016-02-25 NOTE — Telephone Encounter (Signed)
I meant bowels not bladder.  Stated one potassium pill was from this am and did not dissolve. Stated still come in on Thursday for repeat labs.  Pt's daughter agreeable to plan.

## 2016-02-25 NOTE — Telephone Encounter (Signed)
Daughter was calling in to let Cecille Rubin know pt lost bladder control and out came two whole potassium pills.  Pt's daughter stated if pt is not digesting pills should pt come back on December 21 for repeats labs to check potassium.  Will send to Cecille Rubin to advise and will call daughter back.

## 2016-02-26 DIAGNOSIS — L97512 Non-pressure chronic ulcer of other part of right foot with fat layer exposed: Secondary | ICD-10-CM | POA: Diagnosis not present

## 2016-02-26 DIAGNOSIS — L97529 Non-pressure chronic ulcer of other part of left foot with unspecified severity: Secondary | ICD-10-CM | POA: Diagnosis not present

## 2016-02-27 ENCOUNTER — Other Ambulatory Visit: Payer: Medicare Other | Admitting: *Deleted

## 2016-02-27 DIAGNOSIS — E876 Hypokalemia: Secondary | ICD-10-CM | POA: Diagnosis not present

## 2016-02-27 LAB — BASIC METABOLIC PANEL
BUN: 60 mg/dL — ABNORMAL HIGH (ref 7–25)
CO2: 30 mmol/L (ref 20–31)
Calcium: 9.3 mg/dL (ref 8.6–10.3)
Chloride: 99 mmol/L (ref 98–110)
Creat: 1.71 mg/dL — ABNORMAL HIGH (ref 0.70–1.11)
Glucose, Bld: 132 mg/dL — ABNORMAL HIGH (ref 65–99)
Potassium: 4.4 mmol/L (ref 3.5–5.3)
Sodium: 139 mmol/L (ref 135–146)

## 2016-02-28 NOTE — Telephone Encounter (Signed)
S/w daughter per DPR stay on (10 meq) tid of Potassium.

## 2016-02-28 NOTE — Telephone Encounter (Signed)
Follow Up:    [name] wants to know what dose of Potasium does pt need to be taking?

## 2016-03-03 ENCOUNTER — Ambulatory Visit (INDEPENDENT_AMBULATORY_CARE_PROVIDER_SITE_OTHER): Payer: Medicare Other | Admitting: *Deleted

## 2016-03-03 DIAGNOSIS — I255 Ischemic cardiomyopathy: Secondary | ICD-10-CM

## 2016-03-03 DIAGNOSIS — Z952 Presence of prosthetic heart valve: Secondary | ICD-10-CM | POA: Diagnosis not present

## 2016-03-03 DIAGNOSIS — Z5181 Encounter for therapeutic drug level monitoring: Secondary | ICD-10-CM

## 2016-03-03 DIAGNOSIS — I059 Rheumatic mitral valve disease, unspecified: Secondary | ICD-10-CM | POA: Diagnosis not present

## 2016-03-03 LAB — POCT INR: INR: 3.7

## 2016-03-04 ENCOUNTER — Telehealth: Payer: Self-pay | Admitting: Nurse Practitioner

## 2016-03-04 NOTE — Telephone Encounter (Signed)
New message    Pt daughter verbalized that pt is on potassium 3xday and that he has been skipping meals

## 2016-03-04 NOTE — Telephone Encounter (Signed)
[name] [facility]) called with concern that the patient hardly ate any dinner last night and he skipped lunch about 1 week ago. She reports he NEVER skips meals and she is very concerned.  She blames her dad's decrease in appetite on the recent increase in potassium - confirmed with [name] he is taking 10 meq TID. Also confirmed with her that he is NOT taking metolazone still.  She states the potassium does not seem to be a mechanical issue - he can swallow the pills. She is more concerned that he is unable to drink enough water with the pills so the potassium is irritating his stomach.  Confirmed he is taking the potassium AFTER meals.  She wants to DECREASE KDUR back to 10 meq daily. Educated her on normal K values and the importance of potassium for the body.  She understands to call PCP to report decreased appetite. Instructed her to continue medications as directed for now and Dr. Radford Pax and Cecille Rubin would be consulted.  She understands she will be called with further recommendations.

## 2016-03-04 NOTE — Telephone Encounter (Signed)
Please have him come in for a BMET

## 2016-03-05 ENCOUNTER — Encounter: Payer: Self-pay | Admitting: Physician Assistant

## 2016-03-05 ENCOUNTER — Ambulatory Visit (INDEPENDENT_AMBULATORY_CARE_PROVIDER_SITE_OTHER): Payer: Medicare Other | Admitting: Physician Assistant

## 2016-03-05 ENCOUNTER — Inpatient Hospital Stay (HOSPITAL_COMMUNITY)
Admission: AD | Admit: 2016-03-05 | Discharge: 2016-03-10 | DRG: 291 | Disposition: A | Discharge status: Hospice | Payer: Medicare Other | Source: Ambulatory Visit | Attending: Cardiology | Admitting: Cardiology

## 2016-03-05 VITALS — BP 74/60 | HR 91 | Ht 63.0 in | Wt 132.0 lb

## 2016-03-05 DIAGNOSIS — I482 Chronic atrial fibrillation, unspecified: Secondary | ICD-10-CM | POA: Diagnosis present

## 2016-03-05 DIAGNOSIS — I255 Ischemic cardiomyopathy: Secondary | ICD-10-CM

## 2016-03-05 DIAGNOSIS — I5023 Acute on chronic systolic (congestive) heart failure: Secondary | ICD-10-CM | POA: Diagnosis present

## 2016-03-05 DIAGNOSIS — N183 Chronic kidney disease, stage 3 (moderate): Secondary | ICD-10-CM | POA: Diagnosis present

## 2016-03-05 DIAGNOSIS — I420 Dilated cardiomyopathy: Secondary | ICD-10-CM | POA: Diagnosis present

## 2016-03-05 DIAGNOSIS — Z9109 Other allergy status, other than to drugs and biological substances: Secondary | ICD-10-CM

## 2016-03-05 DIAGNOSIS — Z85828 Personal history of other malignant neoplasm of skin: Secondary | ICD-10-CM

## 2016-03-05 DIAGNOSIS — I059 Rheumatic mitral valve disease, unspecified: Secondary | ICD-10-CM | POA: Diagnosis not present

## 2016-03-05 DIAGNOSIS — Z9842 Cataract extraction status, left eye: Secondary | ICD-10-CM

## 2016-03-05 DIAGNOSIS — Z87891 Personal history of nicotine dependence: Secondary | ICD-10-CM

## 2016-03-05 DIAGNOSIS — R627 Adult failure to thrive: Secondary | ICD-10-CM | POA: Diagnosis present

## 2016-03-05 DIAGNOSIS — I472 Ventricular tachycardia: Secondary | ICD-10-CM | POA: Diagnosis present

## 2016-03-05 DIAGNOSIS — I272 Pulmonary hypertension, unspecified: Secondary | ICD-10-CM | POA: Diagnosis present

## 2016-03-05 DIAGNOSIS — L8990 Pressure ulcer of unspecified site, unspecified stage: Secondary | ICD-10-CM | POA: Insufficient documentation

## 2016-03-05 DIAGNOSIS — R630 Anorexia: Secondary | ICD-10-CM | POA: Diagnosis present

## 2016-03-05 DIAGNOSIS — H9190 Unspecified hearing loss, unspecified ear: Secondary | ICD-10-CM | POA: Diagnosis present

## 2016-03-05 DIAGNOSIS — Z7189 Other specified counseling: Secondary | ICD-10-CM

## 2016-03-05 DIAGNOSIS — Z8673 Personal history of transient ischemic attack (TIA), and cerebral infarction without residual deficits: Secondary | ICD-10-CM

## 2016-03-05 DIAGNOSIS — N179 Acute kidney failure, unspecified: Secondary | ICD-10-CM

## 2016-03-05 DIAGNOSIS — Z66 Do not resuscitate: Secondary | ICD-10-CM | POA: Diagnosis present

## 2016-03-05 DIAGNOSIS — Z961 Presence of intraocular lens: Secondary | ICD-10-CM | POA: Diagnosis present

## 2016-03-05 DIAGNOSIS — R6881 Early satiety: Secondary | ICD-10-CM | POA: Diagnosis present

## 2016-03-05 DIAGNOSIS — I2609 Other pulmonary embolism with acute cor pulmonale: Secondary | ICD-10-CM | POA: Diagnosis present

## 2016-03-05 DIAGNOSIS — I2510 Atherosclerotic heart disease of native coronary artery without angina pectoris: Secondary | ICD-10-CM | POA: Diagnosis present

## 2016-03-05 DIAGNOSIS — Z8249 Family history of ischemic heart disease and other diseases of the circulatory system: Secondary | ICD-10-CM

## 2016-03-05 DIAGNOSIS — Z951 Presence of aortocoronary bypass graft: Secondary | ICD-10-CM

## 2016-03-05 DIAGNOSIS — Z515 Encounter for palliative care: Secondary | ICD-10-CM | POA: Diagnosis present

## 2016-03-05 DIAGNOSIS — I959 Hypotension, unspecified: Secondary | ICD-10-CM

## 2016-03-05 DIAGNOSIS — Z79899 Other long term (current) drug therapy: Secondary | ICD-10-CM

## 2016-03-05 DIAGNOSIS — Z7901 Long term (current) use of anticoagulants: Secondary | ICD-10-CM

## 2016-03-05 DIAGNOSIS — Z5181 Encounter for therapeutic drug level monitoring: Secondary | ICD-10-CM

## 2016-03-05 DIAGNOSIS — Z952 Presence of prosthetic heart valve: Secondary | ICD-10-CM

## 2016-03-05 DIAGNOSIS — Z9841 Cataract extraction status, right eye: Secondary | ICD-10-CM

## 2016-03-05 DIAGNOSIS — I130 Hypertensive heart and chronic kidney disease with heart failure and stage 1 through stage 4 chronic kidney disease, or unspecified chronic kidney disease: Principal | ICD-10-CM | POA: Diagnosis present

## 2016-03-05 DIAGNOSIS — Z85038 Personal history of other malignant neoplasm of large intestine: Secondary | ICD-10-CM

## 2016-03-05 DIAGNOSIS — E785 Hyperlipidemia, unspecified: Secondary | ICD-10-CM | POA: Diagnosis present

## 2016-03-05 LAB — PROTIME-INR
INR: 3.59
Prothrombin Time: 36.7 seconds — ABNORMAL HIGH (ref 11.4–15.2)

## 2016-03-05 LAB — COMPREHENSIVE METABOLIC PANEL
ALBUMIN: 3.3 g/dL — AB (ref 3.5–5.0)
ALT: 52 U/L (ref 17–63)
AST: 65 U/L — AB (ref 15–41)
Alkaline Phosphatase: 83 U/L (ref 38–126)
Anion gap: 9 (ref 5–15)
BILIRUBIN TOTAL: 1.3 mg/dL — AB (ref 0.3–1.2)
BUN: 63 mg/dL — AB (ref 6–20)
CALCIUM: 9.4 mg/dL (ref 8.9–10.3)
CO2: 22 mmol/L (ref 22–32)
CREATININE: 2.17 mg/dL — AB (ref 0.61–1.24)
Chloride: 104 mmol/L (ref 101–111)
GFR calc Af Amer: 29 mL/min — ABNORMAL LOW (ref >60)
GFR calc non Af Amer: 25 mL/min — ABNORMAL LOW (ref >60)
GLUCOSE: 127 mg/dL — AB (ref 65–99)
Potassium: 5.1 mmol/L (ref 3.5–5.1)
Sodium: 135 mmol/L (ref 135–145)
TOTAL PROTEIN: 5.9 g/dL — AB (ref 6.5–8.1)

## 2016-03-05 LAB — BRAIN NATRIURETIC PEPTIDE: B Natriuretic Peptide: 3126.2 pg/mL — ABNORMAL HIGH (ref 0.0–100.0)

## 2016-03-05 LAB — MRSA PCR SCREENING: MRSA BY PCR: NEGATIVE

## 2016-03-05 MED ORDER — ADULT MULTIVITAMIN W/MINERALS CH
1.0000 | ORAL_TABLET | Freq: Every day | ORAL | Status: DC
  Administered 2016-03-06 – 2016-03-10 (×5): 1 via ORAL
  Filled 2016-03-05 (×5): qty 1

## 2016-03-05 MED ORDER — POTASSIUM CHLORIDE ER 10 MEQ PO TBCR
10.0000 meq | EXTENDED_RELEASE_TABLET | Freq: Two times a day (BID) | ORAL | Status: DC
  Administered 2016-03-06 – 2016-03-10 (×9): 10 meq via ORAL
  Filled 2016-03-05 (×18): qty 1

## 2016-03-05 MED ORDER — SODIUM CHLORIDE 0.9% FLUSH: 3.0000 mL | INTRAVENOUS | Status: DC | PRN

## 2016-03-05 MED ORDER — SACCHAROMYCES BOULARDII 250 MG PO CAPS
250.0000 mg | ORAL_CAPSULE | Freq: Two times a day (BID) | ORAL | Status: DC
Start: 2016-03-05 — End: 2016-03-10
  Administered 2016-03-05 – 2016-03-10 (×10): 250 mg via ORAL
  Filled 2016-03-05 (×11): qty 1

## 2016-03-05 MED ORDER — WARFARIN - PHARMACIST DOSING INPATIENT
Freq: Every day | Status: DC
  Administered 2016-03-09: 18:00:00

## 2016-03-05 MED ORDER — ACETAMINOPHEN 325 MG PO TABS
650.0000 mg | ORAL_TABLET | ORAL | Status: DC | PRN
  Administered 2016-03-05 – 2016-03-06 (×2): 650 mg via ORAL
  Filled 2016-03-05 (×2): qty 2

## 2016-03-05 MED ORDER — SODIUM CHLORIDE 0.9 % IV SOLN: 250.0000 mL | INTRAVENOUS | Status: DC | PRN

## 2016-03-05 MED ORDER — FUROSEMIDE 10 MG/ML IJ SOLN
40.0000 mg | Freq: Two times a day (BID) | INTRAMUSCULAR | Status: DC
  Administered 2016-03-05 – 2016-03-10 (×10): 40 mg via INTRAVENOUS
  Filled 2016-03-05 (×10): qty 4

## 2016-03-05 MED ORDER — SIMVASTATIN 20 MG PO TABS
10.0000 mg | ORAL_TABLET | Freq: Every day | ORAL | Status: DC
  Administered 2016-03-05 – 2016-03-09 (×5): 10 mg via ORAL
  Filled 2016-03-05 (×5): qty 1

## 2016-03-05 MED ORDER — SODIUM CHLORIDE 0.9% FLUSH
3.0000 mL | Freq: Two times a day (BID) | INTRAVENOUS | Status: DC
  Administered 2016-03-05 – 2016-03-10 (×10): 3 mL via INTRAVENOUS

## 2016-03-05 MED ORDER — ONDANSETRON HCL 4 MG/2ML IJ SOLN: 4.0000 mg | Freq: Four times a day (QID) | INTRAMUSCULAR | Status: DC | PRN

## 2016-03-05 MED ORDER — WARFARIN SODIUM 2.5 MG PO TABS
1.2500 mg | ORAL_TABLET | Freq: Once | ORAL | Status: AC
  Administered 2016-03-05: 1.25 mg via ORAL
  Filled 2016-03-05: qty 0.5

## 2016-03-05 NOTE — Progress Notes (Signed)
Cardiology Office Note    Date:  03/05/2016   ID:  Alfred Dennis, DOB April 05, 1923, MRN [id number]  PCP:  Osborne Casco, MD  Cardiologist:  Dr. Radford Pax  Chief Complaint: leg edema  History of Present Illness:   Alfred Dennis is a 82 y.o. male who presented for Le edema.   He has a history of severe MR s/p MVR, HTN, dyslipidemia, chronic atrial fibrillation, CAD s/p CABG and ischemic dilated CM with EF 35-30% at time of his colon surgery and chronic systolic CHF. He has a history of VT but due to his age and comorbidities he was not felt to be a candidate for ICD. Last echo 05/2015 showed LV EF of 20-25%, also moderately decreased RVEF. There is now severe pulmonary hypertension. Mechanical mitral valve has normal function.  Recently required multiple dose adjustment for LE edema and abx for cellulitis.   Last seen by Truitt Merle 02/18/16. He has had significant diuresis with the Zaroxolyn but with more fatigue - actually fell trying to go the bathroom. Legs still swollen - not as bad - still with some redness. [name] (daughter) is wrapping the legs - using neosporin as well. Still off metolazone. ? Issue with potassium absorption.   LAST BMP 02/27/16 - K of 4.4, Creat of 1.7 and BUN of 60.  He is using walker. Here with daughter and wife. He has gained 5lb in one week. Decreased appetite. Early satiety. + orthopnea, PND and LE edema.   Hard to get his vital today both manually and machine. Finally BP of 74/60 manually however unsure if this is current. Radial pulse is irregular on doppler. EKG showed afib at rate of 91 bpm.   Past Medical History:  Diagnosis Date  . Arthritis    "left foot; fingers" (02/03/2012)  . Bacteremia due to group B Streptococcus    a. 05/2015-->outpt abx x 6 wks complicated by C diff.  . Basal cell carcinoma of shoulder 10/2010  . C. difficile colitis    a. Admit 5/1 - 08/01/2015 - occurred in setting of outpt Abx for Group B Strep  Bacteremia.  . Carcinoma of colon ([place])   . Chronic anticoagulation    a. coumadin.  . Chronic atrial fibrillation (HCC)    a. CHA2DS2VASc = 7-->chronic coumadin.  . Chronic systolic CHF (congestive heart failure) ([place])    a. 05/2015 Echo: EF 15-20%, diff HK.  . CKD (chronic kidney disease), stage III   . Coronary artery disease    a. 05/2000 s/p CABG x 4 (LIMA to LAD, SVG to RI, SVG to OM).  Marland Kitchen GIB (gastrointestinal bleeding)   . Hyperlipidemia   . Hypertension   . Hypertensive heart disease   . Ischemic dilated cardiomyopathy ([place])    a. 01/2012 Echo: EF 25-30%;  b. 05/2015 Echo: EF 15-20%, mild MR, sev dil LA, mod dil RV w/ mod reduced RV fxn, mod dil RA, sev TR, mild PR, PASP 37mmHg.  . Mitral valvular regurgitation    a. 05/2000 s/p SJM 33 mm mechanical MV replacement;  b. 05/2015 Echo: mild MR.  . Non-sustained ventricular tachycardia ([place])   . Prediabetes   . Stroke [facility])    a. 05/2000 after CABG  . Ventricular tachycardia ([place])    a.not felt to be AICD candidate 2/2 advanced age/co-morbidities.    Past Surgical History:  Procedure Laterality Date  . CARDIAC VALVE REPLACEMENT  05/2000   St. Jude 37mm  . CATARACT EXTRACTION W/ INTRAOCULAR LENS  IMPLANT, BILATERAL Bilateral ~ 2011  . COLONOSCOPY  02/08/2012   Procedure: COLONOSCOPY;  Surgeon: Alfred Mayer, MD;  Location: [place];  Service: Endoscopy;  Laterality: N/A;  . CORONARY ARTERY BYPASS GRAFT     LIMA to LAD, SVG to IM, SVG to OM2  . EMBOLECTOMY Left 03/21/2014   Procedure: LEFT BRACHIAL EMBOLECTOMY;  Surgeon: Alfred Dutch, MD;  Location: [place];  Service: Vascular;  Laterality: Left;  . ESOPHAGOGASTRODUODENOSCOPY  02/08/2012   Procedure: ESOPHAGOGASTRODUODENOSCOPY (EGD);  Surgeon: Alfred Mayer, MD;  Location[facility] ENDOSCOPY;  Service: Endoscopy;  Laterality: N/A;  . INGUINAL HERNIA REPAIR Right   . LAPAROSCOPIC PARTIAL COLECTOMY  02/10/2012   Procedure: LAPAROSCOPIC PARTIAL COLECTOMY;  Surgeon: Alfred Klein, MD;   Location: [place];  Service: General;  Laterality: N/A;  . VALVE REPLACEMENT  05/2000   St. Jude 3mm mechanical valve    Current Medications: Prior to Admission medications   Medication Sig Start Date End Date Taking? Authorizing Provider  furosemide (LASIX) 40 MG tablet Take 60 mg by mouth 2 (two) times daily. 11/28/15   Sueanne Margarita, MD  metolazone (ZAROXOLYN) 2.5 MG tablet Take one tablet by mouth daily before taking morning dose of Lasix (furosemide) 02/11/16   Isaiah Serge, NP  Multiple Vitamin (MULTIVITAMIN WITH MINERALS) TABS Take 1 tablet by mouth daily.    Historical Provider, MD  mupirocin ointment (BACTROBAN) 2 % APPLY TO AFFECTED AREA DAILY DURING DRESSING CHANGE 01/22/16   Historical Provider, MD  potassium chloride (K-DUR) 10 MEQ tablet Take 1 tablet (10 mEq total) by mouth 3 (three) times daily. 02/24/16   Burtis Junes, NP  saccharomyces boulardii (FLORASTOR) 250 MG capsule Take 250 mg by mouth 2 (two) times daily.    Historical Provider, MD  simvastatin (ZOCOR) 10 MG tablet Take 10 mg by mouth at bedtime.    Historical Provider, MD  warfarin (COUMADIN) 2.5 MG tablet Take as directed by coumadin clinic 09/18/15   Sueanne Margarita, MD    Allergies:   Tape   Social History   Social History  . Marital status: Married    Spouse name: Pamala Hurry  . Number of children: 2  . Years of education: N/A   Occupational History  . Retired     Cytogeneticist   Social History Main Topics  . Smoking status: Former Smoker    Packs/day: 0.00    Years: 0.00    Types: Cigarettes    Quit date: 03/09/1980  . Smokeless tobacco: Never Used     Comment: 02/03/2012 "quit smoking in my 80's"  . Alcohol use No  . Drug use: No  . Sexual activity: No   Other Topics Concern  . None   Social History Narrative   Lives with wife, Alfred Dennis story single family home   Retired Regulatory affairs officer   Two grown children-boy & girl   Gets medications from [facility] (except  Coumadin)     Family History:  The patient's family history includes Cancer - Prostate in his son; Heart disease in his mother and sister.   ROS:   Please see the history of present illness.    ROS All other systems reviewed and are negative.   PHYSICAL EXAM:   VS:  BP (!) 74/60 (BP Location: Right Arm, Patient Position: Sitting, Cuff Size: Normal)   Pulse 91   Ht 5\' 3"  (1.6 m)   Wt 132 lb (59.9 kg)  BMI 23.38 kg/m    GEN: Thin frail male in no acute distress  HEENT: normal  Neck: + JVD, carotid bruits, or masses Cardiac: Ir IR; no murmurs, rubs, or gallops, 3-4+ BL LEedema  Respiratory: bibasilar rales normal work of breathing GI: soft, nontender, nondistended, + BS MS: no deformity or atrophy  Skin: warm and dry, no rash Neuro:  Alert and Oriented x 3, Strength and sensation are intact Psych: euthymic mood, full affect  Wt Readings from Last 3 Encounters:  03/05/16 132 lb (59.9 kg)  02/18/16 128 lb (58.1 kg)  02/11/16 139 lb (63 kg)      Studies/Labs Reviewed:   EKG:  EKG is ordered today.  The ekg ordered today demonstrates afib at rate of 91 bpm  Recent Labs: 06/09/2015: Magnesium 2.5 02/11/2016: ALT 22; Hemoglobin 12.5; Platelets 156 02/18/2016: Brain Natriuretic Peptide 1,203.3 02/27/2016: BUN 60; Creat 1.71; Potassium 4.4; Sodium 139   Lipid Panel    Component Value Date/Time   CHOL 101 (L) 12/02/2015 0835   TRIG 80 12/02/2015 0835   HDL 51 12/02/2015 0835   CHOLHDL 2.0 12/02/2015 0835   VLDL 16 12/02/2015 0835   LDLCALC 34 12/02/2015 0835    Additional studies/ records that were reviewed today include:   As above Echo Study Conclusions from 05/2015  - Left ventricle: The cavity size was moderately dilated. Systolic function was severely reduced. The estimated ejection fraction was in the range of 15-20%. Diffuse hypokinesis. The study is not technically sufficient to allow evaluation of LV diastolic function. - Ventricular septum: Septal  motion showed paradox. - Aortic valve: There was no regurgitation. - Aortic root: The aortic root was normal in size. - Mitral valve: There was mild regurgitation. Valve area by continuity equation (using LVOT flow): 1.44 cm^2. - Left atrium: The atrium was severely dilated. - Right ventricle: The cavity size was moderately dilated. Wall thickness was normal. Systolic function was moderately reduced. - Right atrium: The atrium was moderately dilated. - Tricuspid valve: There was severe regurgitation. - Pulmonic valve: There was mild regurgitation. - Pulmonary arteries: Systolic pressure was severely increased. PA peak pressure: 55 mm Hg (S). - Inferior vena cava: The vessel was dilated. The respirophasic diameter changes were blunted (<50%), consistent with elevated central venous pressure. - Pericardium, extracardiac: There was no pericardial effusion.  Impressions:  - Compared to the prior study from 03/12/2014 LVEF is now decreased estimated at 15-20%, also moderately decreased RVEF. There is now severe pulmonary hypertension. Mechanical mitral valve has normal function.    ASSESSMENT & PLAN:    1. Acute on hronic systolic CHF - EF of 0000000. Difficult situation given low BP. DoD Dr. Lovena Le came by and saw the patient. Will need admission for IV diuretics with close f/u on BP. Likely end stage of life.Will be on Dr. Radford Pax service (primary cardiologist). Likely evaluation by CHF team. Concern for R sided CHF.   2. Permanent atrial fibrillation - On coumadin  3. ICM - Not on optimal medical therapy due to low BP. Overall prognosis very poor.  4. CAD s/p CABG - No angina.   5. Hx of MVR - On coumadin per pharmacy  6. Hypotension - AS above   I have spent over 45 minutes, face-to-face with the patient and over 50% was spent in counseling and/or coordination of care. This includes review of prior records, care discussion with DOD (Dr. Lovena Le), need  ofadmission, developing & discussing different plans, discussion of disease and its complications, life  style changes.    Medication Adjustments/Labs and Tests Ordered: Current medicines are reviewed at length with the patient today.  Concerns regarding medicines are outlined above.  Medication changes, Labs and Tests ordered today are listed in the Patient Instructions below. There are no Patient Instructions on file for this visit.   Jarrett Soho, [place]  03/05/2016 2:28 PM    [facility] [place] [address], [place], [place]  [address] Phone: [phone]; Fax: [phone]   Cardiology Attending  Patient seen and examined. I have reviewed all aspects of the note above. I have discussed my findings with the patient and Mr. Curly [place], [place]. The patient presents today for evaluation of lower extremity swelling, loss of appetite and failure to thrive. He has an endstage CM, chronic atrial fib and worsening edema. He has not had syncope. He is anorexic. His exam is notable for a blood pressure of 75/40, rales about 1/3 up, abdominal distention, 10 cm of JVD, 3+ peripheral edema. His ECG shows atrial fib with an IVCD/LBBB. He is diskempt and chronically ill appearing.  A/P 1. Acute on chronic systolic heart failure - he appears to have a low output state and is fluid overloaded. I have recommended admission for IV lasix and likely ionotropic support. His long term prognosis is poor. 2. Atrial fib - his rate is controlled.  3. Weight loss - I am concerned he has gut edema.   Mikle Bosworth.D.

## 2016-03-05 NOTE — Telephone Encounter (Signed)
Called to schedule BMET to evaluate potassium level. Karen's anxiety is higher now and she states her dad's weight is fluctuating and it seems his legs are getting "puffy" again.  She requests an ASAP appointment as she is "really concerned" about her dad and wants him evaluated.  Patient has OV scheduled next week. Rescheduled him to TODAY at 1330 with Bhagat, PA. She will bring his list of weights for Vin to review. She wants assessment of swelling and to discuss decreasing potassium frequency as he cannot drink enough water with the pills and it is causing stomach upset. She understands BMET will be drawn at visit to help determine necessity.

## 2016-03-05 NOTE — Progress Notes (Addendum)
ANTICOAGULATION CONSULT NOTE - Initial Consult  Pharmacy Consult for Coumadin Indication: mechanical heart valve  Allergies  Allergen Reactions  . Tape Other (See Comments)    CERTAIN BANDAGES CAN TEAR THE SKIN; PLEASE USE PAPER TAPE    Patient Measurements: Height: 5\' 3"  (160 cm) Weight: 129 lb 13.6 oz (58.9 kg) IBW/kg (Calculated) : 56.9   Vital Signs: Temp: 97.8 F (36.6 C) (12/28 1716) Temp Source: Oral (12/28 1716) BP: 107/69 (12/28 1716) Pulse Rate: 92 (12/28 1716)  Labs:  Recent Labs  03/03/16 1346  INR 3.7    Estimated Creatinine Clearance: 23.1 mL/min (by C-G formula based on SCr of 1.71 mg/dL (H)).   Medical History: Past Medical History:  Diagnosis Date  . Arthritis    "left foot; fingers" (02/03/2012)  . Bacteremia due to group B Streptococcus    a. 05/2015-->outpt abx x 6 wks complicated by C diff.  . Basal cell carcinoma of shoulder 10/2010  . C. difficile colitis    a. Admit 5/1 - 08/01/2015 - occurred in setting of outpt Abx for Group B Strep Bacteremia.  . Carcinoma of colon ([place])   . Chronic anticoagulation    a. coumadin.  . Chronic atrial fibrillation (HCC)    a. CHA2DS2VASc = 7-->chronic coumadin.  . Chronic systolic CHF (congestive heart failure) ([place])    a. 05/2015 Echo: EF 15-20%, diff HK.  . CKD (chronic kidney disease), stage III   . Coronary artery disease    a. 05/2000 s/p CABG x 4 (LIMA to LAD, SVG to RI, SVG to OM).  Marland Kitchen GIB (gastrointestinal bleeding)   . Hyperlipidemia   . Hypertension   . Hypertensive heart disease   . Ischemic dilated cardiomyopathy ([place])    a. 01/2012 Echo: EF 25-30%;  b. 05/2015 Echo: EF 15-20%, mild MR, sev dil LA, mod dil RV w/ mod reduced RV fxn, mod dil RA, sev TR, mild PR, PASP 33mmHg.  . Mitral valvular regurgitation    a. 05/2000 s/p SJM 33 mm mechanical MV replacement;  b. 05/2015 Echo: mild MR.  . Non-sustained ventricular tachycardia ([place])   . Prediabetes   . Stroke [facility])    a. 05/2000 after CABG  .  Ventricular tachycardia ([place])    a.not felt to be AICD candidate 2/2 advanced age/co-morbidities.    Medications:  Scheduled:  . furosemide  40 mg Intravenous BID  . multivitamin with minerals  1 tablet Oral Daily  . potassium chloride  10 mEq Oral BID  . saccharomyces boulardii  250 mg Oral BID  . simvastatin  10 mg Oral QHS  . sodium chloride flush  3 mL Intravenous Q12H    Assessment: 82yo male presenting with LE edema.  Pt with mechanical heart valve on Coumadin pta, goal INR 2.5-3.5.  Per Coumadin clinic visit on 12/26 his home dose is 2.5mg  daily x 1.25mg  on MWF.  He has not taken his Coumadin yet today, no INR has been drawn.  INR 3.7 on 12/26- instructed to hold dose that day & resume previous dosing on 12/27.  Goal of Therapy:  INR 2.5-3.5 Monitor platelets by anticoagulation protocol: Yes   Plan:  Protime now, then daily Coumadin dosing to follow  Gracy Bruins, PharmD [facility]  03/05/16 [phone])   INR 3.59, remain mildly above goal after held dose on 12/26 and Coumadin 1.25mg  yesterday.    1-  Repeat Coumadin 1.25mg  (in lieu of 2.5mg  norm) 2-  Daily INR 3-  Watch for s/s of bleeding  Gracy Bruins, PharmD Clinical Pharmacist [facility]

## 2016-03-06 DIAGNOSIS — N183 Chronic kidney disease, stage 3 (moderate): Secondary | ICD-10-CM

## 2016-03-06 DIAGNOSIS — Z951 Presence of aortocoronary bypass graft: Secondary | ICD-10-CM | POA: Diagnosis not present

## 2016-03-06 DIAGNOSIS — R531 Weakness: Secondary | ICD-10-CM | POA: Diagnosis not present

## 2016-03-06 DIAGNOSIS — I270 Primary pulmonary hypertension: Secondary | ICD-10-CM | POA: Diagnosis not present

## 2016-03-06 DIAGNOSIS — I420 Dilated cardiomyopathy: Secondary | ICD-10-CM | POA: Diagnosis present

## 2016-03-06 DIAGNOSIS — I482 Chronic atrial fibrillation: Secondary | ICD-10-CM

## 2016-03-06 DIAGNOSIS — Z961 Presence of intraocular lens: Secondary | ICD-10-CM | POA: Diagnosis present

## 2016-03-06 DIAGNOSIS — E46 Unspecified protein-calorie malnutrition: Secondary | ICD-10-CM | POA: Diagnosis not present

## 2016-03-06 DIAGNOSIS — R600 Localized edema: Secondary | ICD-10-CM | POA: Diagnosis not present

## 2016-03-06 DIAGNOSIS — Z9842 Cataract extraction status, left eye: Secondary | ICD-10-CM | POA: Diagnosis not present

## 2016-03-06 DIAGNOSIS — N170 Acute kidney failure with tubular necrosis: Secondary | ICD-10-CM | POA: Diagnosis not present

## 2016-03-06 DIAGNOSIS — I4901 Ventricular fibrillation: Secondary | ICD-10-CM | POA: Diagnosis not present

## 2016-03-06 DIAGNOSIS — I679 Cerebrovascular disease, unspecified: Secondary | ICD-10-CM | POA: Diagnosis not present

## 2016-03-06 DIAGNOSIS — I959 Hypotension, unspecified: Secondary | ICD-10-CM | POA: Diagnosis present

## 2016-03-06 DIAGNOSIS — R630 Anorexia: Secondary | ICD-10-CM | POA: Diagnosis present

## 2016-03-06 DIAGNOSIS — Z7189 Other specified counseling: Secondary | ICD-10-CM | POA: Diagnosis not present

## 2016-03-06 DIAGNOSIS — H9190 Unspecified hearing loss, unspecified ear: Secondary | ICD-10-CM | POA: Diagnosis present

## 2016-03-06 DIAGNOSIS — I519 Heart disease, unspecified: Secondary | ICD-10-CM | POA: Diagnosis not present

## 2016-03-06 DIAGNOSIS — E785 Hyperlipidemia, unspecified: Secondary | ICD-10-CM | POA: Diagnosis present

## 2016-03-06 DIAGNOSIS — I272 Pulmonary hypertension, unspecified: Secondary | ICD-10-CM | POA: Diagnosis present

## 2016-03-06 DIAGNOSIS — R6881 Early satiety: Secondary | ICD-10-CM | POA: Diagnosis present

## 2016-03-06 DIAGNOSIS — I5020 Unspecified systolic (congestive) heart failure: Secondary | ICD-10-CM | POA: Diagnosis not present

## 2016-03-06 DIAGNOSIS — I349 Nonrheumatic mitral valve disorder, unspecified: Secondary | ICD-10-CM | POA: Diagnosis not present

## 2016-03-06 DIAGNOSIS — I5023 Acute on chronic systolic (congestive) heart failure: Secondary | ICD-10-CM

## 2016-03-06 DIAGNOSIS — I130 Hypertensive heart and chronic kidney disease with heart failure and stage 1 through stage 4 chronic kidney disease, or unspecified chronic kidney disease: Secondary | ICD-10-CM | POA: Diagnosis present

## 2016-03-06 DIAGNOSIS — I25119 Atherosclerotic heart disease of native coronary artery with unspecified angina pectoris: Secondary | ICD-10-CM | POA: Diagnosis not present

## 2016-03-06 DIAGNOSIS — Z9841 Cataract extraction status, right eye: Secondary | ICD-10-CM | POA: Diagnosis not present

## 2016-03-06 DIAGNOSIS — E8770 Fluid overload, unspecified: Secondary | ICD-10-CM | POA: Diagnosis not present

## 2016-03-06 DIAGNOSIS — N179 Acute kidney failure, unspecified: Secondary | ICD-10-CM | POA: Diagnosis present

## 2016-03-06 DIAGNOSIS — Z66 Do not resuscitate: Secondary | ICD-10-CM | POA: Diagnosis not present

## 2016-03-06 DIAGNOSIS — I255 Ischemic cardiomyopathy: Secondary | ICD-10-CM | POA: Diagnosis present

## 2016-03-06 DIAGNOSIS — I4891 Unspecified atrial fibrillation: Secondary | ICD-10-CM | POA: Diagnosis not present

## 2016-03-06 DIAGNOSIS — I2609 Other pulmonary embolism with acute cor pulmonale: Secondary | ICD-10-CM

## 2016-03-06 DIAGNOSIS — I472 Ventricular tachycardia: Secondary | ICD-10-CM | POA: Diagnosis present

## 2016-03-06 DIAGNOSIS — Z515 Encounter for palliative care: Secondary | ICD-10-CM | POA: Diagnosis not present

## 2016-03-06 DIAGNOSIS — I2510 Atherosclerotic heart disease of native coronary artery without angina pectoris: Secondary | ICD-10-CM | POA: Diagnosis not present

## 2016-03-06 DIAGNOSIS — Z952 Presence of prosthetic heart valve: Secondary | ICD-10-CM | POA: Diagnosis not present

## 2016-03-06 DIAGNOSIS — I10 Essential (primary) hypertension: Secondary | ICD-10-CM | POA: Diagnosis not present

## 2016-03-06 DIAGNOSIS — R627 Adult failure to thrive: Secondary | ICD-10-CM | POA: Diagnosis present

## 2016-03-06 LAB — BASIC METABOLIC PANEL
ANION GAP: 10 (ref 5–15)
BUN: 57 mg/dL — ABNORMAL HIGH (ref 6–20)
CALCIUM: 9 mg/dL (ref 8.9–10.3)
CO2: 23 mmol/L (ref 22–32)
CREATININE: 1.82 mg/dL — AB (ref 0.61–1.24)
Chloride: 104 mmol/L (ref 101–111)
GFR calc non Af Amer: 31 mL/min — ABNORMAL LOW (ref >60)
GFR, EST AFRICAN AMERICAN: 36 mL/min — AB (ref >60)
Glucose, Bld: 105 mg/dL — ABNORMAL HIGH (ref 65–99)
Potassium: 4 mmol/L (ref 3.5–5.1)
Sodium: 137 mmol/L (ref 135–145)

## 2016-03-06 LAB — PROTIME-INR
INR: 3.74
PROTHROMBIN TIME: 37.9 s — AB (ref 11.4–15.2)

## 2016-03-06 LAB — CBC
HCT: 36 % — ABNORMAL LOW (ref 39.0–52.0)
Hemoglobin: 12 g/dL — ABNORMAL LOW (ref 13.0–17.0)
MCH: 29.5 pg (ref 26.0–34.0)
MCHC: 33.3 g/dL (ref 30.0–36.0)
MCV: 88.5 fL (ref 78.0–100.0)
PLATELETS: 150 10*3/uL (ref 150–400)
RBC: 4.07 MIL/uL — ABNORMAL LOW (ref 4.22–5.81)
RDW: 15.1 % (ref 11.5–15.5)
WBC: 6 10*3/uL (ref 4.0–10.5)

## 2016-03-06 NOTE — H&P (Signed)
Cardiology Office Note    Date:  03/05/2016   ID:  Alfred Dennis, DOB January 26, 1924, MRN [id number]  PCP:  Osborne Casco, MD         Cardiologist:  Dr. Radford Pax  Chief Complaint: leg edema  History of Present Illness:   Alfred Dennis is a 82 y.o. male who presented for Le edema.   He has a history of severe MR s/p MVR, HTN, dyslipidemia, chronic atrial fibrillation, CAD s/p CABG and ischemic dilated CM with EF 35-30% at time of his colon surgery and chronic systolic CHF. He has a history of VT but due to his age and comorbidities he was not felt to be a candidate for ICD. Last echo 05/2015 showed LV EF of 20-25%, also moderately decreased RVEF. There is now severe pulmonary hypertension. Mechanical mitral valve has normal function.  Recently required multiple dose adjustment for LE edema and abx for cellulitis.   Last seen by Truitt Merle 02/18/16. He has had significant diuresis with the Zaroxolyn but with more fatigue - actually fell trying to go the bathroom. Legs still swollen - not as bad - still with some redness. [name] (daughter) is wrapping the legs - using neosporin as well. Still off metolazone. ? Issue with potassium absorption.   LAST BMP 02/27/16 - K of 4.4, Creat of 1.7 and BUN of 60.  He is using walker. Here with daughter and wife. He has gained 5lb in one week. Decreased appetite. Early satiety. + orthopnea, PND and LE edema.   Hard to get his vital today both manually and machine. Finally BP of 74/60 manually however unsure if this is current. Radial pulse is irregular on doppler. EKG showed afib at rate of 91 bpm.       Past Medical History:  Diagnosis Date  . Arthritis    "left foot; fingers" (02/03/2012)  . Bacteremia due to group B Streptococcus    a. 05/2015-->outpt abx x 6 wks complicated by C diff.  . Basal cell carcinoma of shoulder 10/2010  . C. difficile colitis    a. Admit 5/1 - 08/01/2015 - occurred in setting of outpt  Abx for Group B Strep Bacteremia.  . Carcinoma of colon ([place])   . Chronic anticoagulation    a. coumadin.  . Chronic atrial fibrillation (HCC)    a. CHA2DS2VASc = 7-->chronic coumadin.  . Chronic systolic CHF (congestive heart failure) ([place])    a. 05/2015 Echo: EF 15-20%, diff HK.  . CKD (chronic kidney disease), stage III   . Coronary artery disease    a. 05/2000 s/p CABG x 4 (LIMA to LAD, SVG to RI, SVG to OM).  Marland Kitchen GIB (gastrointestinal bleeding)   . Hyperlipidemia   . Hypertension   . Hypertensive heart disease   . Ischemic dilated cardiomyopathy ([place])    a. 01/2012 Echo: EF 25-30%;  b. 05/2015 Echo: EF 15-20%, mild MR, sev dil LA, mod dil RV w/ mod reduced RV fxn, mod dil RA, sev TR, mild PR, PASP 66mmHg.  . Mitral valvular regurgitation    a. 05/2000 s/p SJM 33 mm mechanical MV replacement;  b. 05/2015 Echo: mild MR.  . Non-sustained ventricular tachycardia ([place])   . Prediabetes   . Stroke [facility])    a. 05/2000 after CABG  . Ventricular tachycardia ([place])    a.not felt to be AICD candidate 2/2 advanced age/co-morbidities.         Past Surgical History:  Procedure Laterality Date  . CARDIAC VALVE  REPLACEMENT  05/2000   St. Jude 79mm  . CATARACT EXTRACTION W/ INTRAOCULAR LENS  IMPLANT, BILATERAL Bilateral ~ 2011  . COLONOSCOPY  02/08/2012   Procedure: COLONOSCOPY;  Surgeon: Gatha Mayer, MD;  Location: [place];  Service: Endoscopy;  Laterality: N/A;  . CORONARY ARTERY BYPASS GRAFT     LIMA to LAD, SVG to IM, SVG to OM2  . EMBOLECTOMY Left 03/21/2014   Procedure: LEFT BRACHIAL EMBOLECTOMY;  Surgeon: Elam Dutch, MD;  Location: [place];  Service: Vascular;  Laterality: Left;  . ESOPHAGOGASTRODUODENOSCOPY  02/08/2012   Procedure: ESOPHAGOGASTRODUODENOSCOPY (EGD);  Surgeon: Gatha Mayer, MD;  Location: [facility] ENDOSCOPY;  Service: Endoscopy;  Laterality: N/A;  . INGUINAL HERNIA REPAIR Right   . LAPAROSCOPIC PARTIAL COLECTOMY  02/10/2012    Procedure: LAPAROSCOPIC PARTIAL COLECTOMY;  Surgeon: Stark Klein, MD;  Location: [place];  Service: General;  Laterality: N/A;  . VALVE REPLACEMENT  05/2000   St. Jude 31mm mechanical valve    Current Medications:        Prior to Admission medications   Medication Sig Start Date End Date Taking? Authorizing Provider  furosemide (LASIX) 40 MG tablet Take 60 mg by mouth 2 (two) times daily. 11/28/15   Sueanne Margarita, MD  metolazone (ZAROXOLYN) 2.5 MG tablet Take one tablet by mouth daily before taking morning dose of Lasix (furosemide) 02/11/16   Isaiah Serge, NP  Multiple Vitamin (MULTIVITAMIN WITH MINERALS) TABS Take 1 tablet by mouth daily.    Historical Provider, MD  mupirocin ointment (BACTROBAN) 2 % APPLY TO AFFECTED AREA DAILY DURING DRESSING CHANGE 01/22/16   Historical Provider, MD  potassium chloride (K-DUR) 10 MEQ tablet Take 1 tablet (10 mEq total) by mouth 3 (three) times daily. 02/24/16   Burtis Junes, NP  saccharomyces boulardii (FLORASTOR) 250 MG capsule Take 250 mg by mouth 2 (two) times daily.    Historical Provider, MD  simvastatin (ZOCOR) 10 MG tablet Take 10 mg by mouth at bedtime.    Historical Provider, MD  warfarin (COUMADIN) 2.5 MG tablet Take as directed by coumadin clinic 09/18/15   Sueanne Margarita, MD    Allergies:   Tape   Social History   Social History  . Marital status: Married    Spouse name: Pamala Hurry  . Number of children: 2  . Years of education: N/A        Occupational History  . Retired     Cytogeneticist         Social History Main Topics  . Smoking status: Former Smoker    Packs/day: 0.00    Years: 0.00    Types: Cigarettes    Quit date: 03/09/1980  . Smokeless tobacco: Never Used     Comment: 02/03/2012 "quit smoking in my 30's"  . Alcohol use No  . Drug use: No  . Sexual activity: No       Other Topics Concern  . None      Social History Narrative   Lives with wife, [name] story single family home   Retired Regulatory affairs officer   Two grown children-boy & girl   Gets medications from [facility] (except Coumadin)     Family History:  The patient's family history includes Cancer - Prostate in his son; Heart disease in his mother and sister.   ROS:   Please see the history of present illness.    ROS All other systems reviewed and are negative.  PHYSICAL EXAM:   VS:  BP (!) 74/60 (BP Location: Right Arm, Patient Position: Sitting, Cuff Size: Normal)   Pulse 91   Ht 5\' 3"  (1.6 m)   Wt 132 lb (59.9 kg)   BMI 23.38 kg/m    GEN: Thin frail male in no acute distress  HEENT: normal  Neck: + JVD, carotid bruits, or masses Cardiac: Ir IR; no murmurs, rubs, or gallops, 3-4+ BL LEedema  Respiratory: bibasilar rales normal work of breathing GI: soft, nontender, nondistended, + BS MS: no deformity or atrophy  Skin: warm and dry, no rash Neuro:  Alert and Oriented x 3, Strength and sensation are intact Psych: euthymic mood, full affect     Wt Readings from Last 3 Encounters:  03/05/16 132 lb (59.9 kg)  02/18/16 128 lb (58.1 kg)  02/11/16 139 lb (63 kg)      Studies/Labs Reviewed:   EKG:  EKG is ordered today.  The ekg ordered today demonstrates afib at rate of 91 bpm  Recent Labs: 06/09/2015: Magnesium 2.5 02/11/2016: ALT 22; Hemoglobin 12.5; Platelets 156 02/18/2016: Brain Natriuretic Peptide 1,203.3 02/27/2016: BUN 60; Creat 1.71; Potassium 4.4; Sodium 139   Lipid Panel Labs (Brief)          Component Value Date/Time   CHOL 101 (L) 12/02/2015 0835   TRIG 80 12/02/2015 0835   HDL 51 12/02/2015 0835   CHOLHDL 2.0 12/02/2015 0835   VLDL 16 12/02/2015 0835   LDLCALC 34 12/02/2015 0835      Additional studies/ records that were reviewed today include:   As above Echo Study Conclusions from 05/2015  - Left ventricle: The cavity size was moderately dilated. Systolic function was severely reduced. The  estimated ejection fraction was in the range of 15-20%. Diffuse hypokinesis. The study is not technically sufficient to allow evaluation of LV diastolic function. - Ventricular septum: Septal motion showed paradox. - Aortic valve: There was no regurgitation. - Aortic root: The aortic root was normal in size. - Mitral valve: There was mild regurgitation. Valve area by continuity equation (using LVOT flow): 1.44 cm^2. - Left atrium: The atrium was severely dilated. - Right ventricle: The cavity size was moderately dilated. Wall thickness was normal. Systolic function was moderately reduced. - Right atrium: The atrium was moderately dilated. - Tricuspid valve: There was severe regurgitation. - Pulmonic valve: There was mild regurgitation. - Pulmonary arteries: Systolic pressure was severely increased. PA peak pressure: 55 mm Hg (S). - Inferior vena cava: The vessel was dilated. The respirophasic diameter changes were blunted (<50%), consistent with elevated central venous pressure. - Pericardium, extracardiac: There was no pericardial effusion.  Impressions:  - Compared to the prior study from 03/12/2014 LVEF is now decreased estimated at 15-20%, also moderately decreased RVEF. There is now severe pulmonary hypertension. Mechanical mitral valve has normal function.    ASSESSMENT & PLAN:    1. Acute on hronic systolic CHF - EF of 0000000. Difficult situation given low BP. DoD Dr. Lovena Le came by and saw the patient. Will need admission for IV diuretics with close f/u on BP. Likely end stage of life.Will be on Dr. Radford Pax service (primary cardiologist). Likely evaluation by CHF team. Concern for R sided CHF.   2. Permanent atrial fibrillation - On coumadin  3. ICM - Not on optimal medical therapy due to low BP. Overall prognosis very poor.  4. CAD s/p CABG - No angina.   5. Hx of MVR - On coumadin per pharmacy  6. Hypotension -  AS  above   I have spent over 45 minutes, face-to-face with the patient and over 50% was spent in counseling and/or coordination of care. This includes review of prior records, care discussion with DOD (Dr. Lovena Le), need ofadmission, developing & discussing different plans, discussion of disease and its complications, life style changes.    Medication Adjustments/Labs and Tests Ordered: Current medicines are reviewed at length with the patient today.  Concerns regarding medicines are outlined above.  Medication changes, Labs and Tests ordered today are listed in the Patient Instructions below. There are no Patient Instructions on file for this visit.   Jarrett Soho, [place]  03/05/2016 2:28 PM    [facility] [address], [place], [place]  [address] Phone: [phone]; Fax: [phone]   Cardiology Attending  Patient seen and examined. I have reviewed all aspects of the note above. I have discussed my findings with the patient and Mr. Curly [place], [place]. The patient presents today for evaluation of lower extremity swelling, loss of appetite and failure to thrive. He has an endstage CM, chronic atrial fib and worsening edema. He has not had syncope. He is anorexic. His exam is notable for a blood pressure of 75/40, rales about 1/3 up, abdominal distention, 10 cm of JVD, 3+ peripheral edema. His ECG shows atrial fib with an IVCD/LBBB. He is diskempt and chronically ill appearing.  A/P 1. Acute on chronic systolic heart failure - he appears to have a low output state and is fluid overloaded. I have recommended admission for IV lasix and likely ionotropic support. His long term prognosis is poor. 2. Atrial fib - his rate is controlled.  3. Weight loss - I am concerned he has gut edema.    Mikle Bosworth.D.

## 2016-03-06 NOTE — Progress Notes (Signed)
Patient just had a 33 beats run of Vtach at 0551. Patient is asymptomatic and in NAD. Cardiology notified.

## 2016-03-06 NOTE — Progress Notes (Signed)
[place] for Coumadin Indication: mechanical heart valve, Afib  Allergies  Allergen Reactions  . Tape Other (See Comments)    CERTAIN BANDAGES CAN TEAR THE SKIN; PLEASE USE PAPER TAPE    Patient Measurements: Height: 5\' 3"  (160 cm) Weight: 128 lb 12 oz (58.4 kg) IBW/kg (Calculated) : 56.9   Vital Signs: Temp: 97.2 F (36.2 C) (12/29 0806) Temp Source: Oral (12/29 0806) BP: 104/74 (12/29 0806) Pulse Rate: 85 (12/29 0806)  Labs:  Recent Labs  03/03/16 1346 03/05/16 1918 03/06/16 0244  LABPROT  --  36.7* 37.9*  INR 3.7 3.59 3.74  CREATININE  --  2.17* 1.82*    Estimated Creatinine Clearance: 21.7 mL/min (by C-G formula based on SCr of 1.82 mg/dL (H)).   Medical History: Past Medical History:  Diagnosis Date  . Arthritis    "left foot; fingers" (02/03/2012)  . Bacteremia due to group B Streptococcus    a. 05/2015-->outpt abx x 6 wks complicated by C diff.  . Basal cell carcinoma of shoulder 10/2010  . C. difficile colitis    a. Admit 5/1 - 08/01/2015 - occurred in setting of outpt Abx for Group B Strep Bacteremia.  . Carcinoma of colon ([place])   . Chronic anticoagulation    a. coumadin.  . Chronic atrial fibrillation (HCC)    a. CHA2DS2VASc = 7-->chronic coumadin.  . Chronic systolic CHF (congestive heart failure) ([place])    a. 05/2015 Echo: EF 15-20%, diff HK.  . CKD (chronic kidney disease), stage III   . Coronary artery disease    a. 05/2000 s/p CABG x 4 (LIMA to LAD, SVG to RI, SVG to OM).  Marland Kitchen GIB (gastrointestinal bleeding)   . Hyperlipidemia   . Hypertension   . Hypertensive heart disease   . Ischemic dilated cardiomyopathy ([place])    a. 01/2012 Echo: EF 25-30%;  b. 05/2015 Echo: EF 15-20%, mild MR, sev dil LA, mod dil RV w/ mod reduced RV fxn, mod dil RA, sev TR, mild PR, PASP 18mmHg.  . Mitral valvular regurgitation    a. 05/2000 s/p SJM 33 mm mechanical MV replacement;  b. 05/2015 Echo: mild MR.  . Non-sustained  ventricular tachycardia ([place])   . Prediabetes   . Stroke [facility])    a. 05/2000 after CABG  . Ventricular tachycardia ([place])    a.not felt to be AICD candidate 2/2 advanced age/co-morbidities.    Medications:  Scheduled:  . furosemide  40 mg Intravenous BID  . multivitamin with minerals  1 tablet Oral Daily  . potassium chloride  10 mEq Oral BID  . saccharomyces boulardii  250 mg Oral BID  . simvastatin  10 mg Oral QHS  . sodium chloride flush  3 mL Intravenous Q12H  . Warfarin - Pharmacist Dosing Inpatient   Does not apply q1800    Assessment: 82yo male presenting with LE edema.  Pt with mechanical MVR on Coumadin pta, goal INR 2.5-3.5.  Per Coumadin clinic visit on 12/26 his home dose is 2.5mg  daily x 1.25mg  on MWF.   INR 3.7 on 12/26- instructed to hold dose that day & resume previous dosing on 12/27.  Today's INR elevated at 3.74.  No bleeding or complications noted.  Goal of Therapy:  INR 2.5-3.5 Monitor platelets by anticoagulation protocol: Yes   Plan:  No Coumadin tonight. Daily PT/INR.  Uvaldo Rising, BCPS  Clinical Pharmacist Pager [phone]  03/06/2016 8:36 AM

## 2016-03-06 NOTE — Progress Notes (Signed)
Patient thought to have desaturated to the 70's and low 80's as nurse was unable to bring the O2Sat back up on the monitor despite a non-rebreather mask at 15 L/min. Bilateral fingers as well as right earlobe were tried. Cardiology, E-link, and respiratory were paged. O2 saturation finally came back to 100% as nurse used the sensor on the left earlobe. Currently O2Sat is 100% at RA. Patient in no distress. Will continue to monitor closely.

## 2016-03-06 NOTE — Progress Notes (Signed)
Patient Name: Alfred Dennis Date of Encounter: 03/06/2016  Primary Cardiologist: Alfred Him, MD  Hospital Problem List     Active Problems:   Acute on chronic systolic CHF (congestive heart failure) (HCC)     Subjective   No complaints this am except for severe LE swelling.  Apparently desaturated last night but now O2 sats 100%.  Nurse states he was very cold and pulse ox was not tracking.    Inpatient Medications    Scheduled Meds: . furosemide  40 mg Intravenous BID  . multivitamin with minerals  1 tablet Oral Daily  . potassium chloride  10 mEq Oral BID  . saccharomyces boulardii  250 mg Oral BID  . simvastatin  10 mg Oral QHS  . sodium chloride flush  3 mL Intravenous Q12H  . Warfarin - Pharmacist Dosing Inpatient   Does not apply q1800   Continuous Infusions:  PRN Meds: sodium chloride, acetaminophen, ondansetron (ZOFRAN) IV, sodium chloride flush   Vital Signs    Vitals:   03/05/16 2347 03/06/16 0433 03/06/16 0700 03/06/16 0806  BP: 111/66 97/72  104/74  Pulse:  73  85  Resp: 14 13  15   Temp: 97.4 F (36.3 C) 97.4 F (36.3 C)  97.2 F (36.2 C)  TempSrc: Oral Oral  Oral  SpO2: 100% 100%  100%  Weight:   128 lb 12 oz (58.4 kg)   Height:        Intake/Output Summary (Last 24 hours) at 03/06/16 0809 Last data filed at 03/06/16 0700  Gross per 24 hour  Intake                0 ml  Output             1300 ml  Net            -1300 ml   Filed Weights   03/05/16 1656 03/05/16 1716 03/06/16 0700  Weight: 129 lb 13.6 oz (58.9 kg) 129 lb 13.6 oz (58.9 kg) 128 lb 12 oz (58.4 kg)    Physical Exam    GEN: Well nourished, well developed, in no acute distress.  HEENT: Grossly normal.  Neck: Supple, no JVD, carotid bruits, or masses. Cardiac: irregularly irregular, no murmurs, rubs, or gallops. No clubbing, cyanosis.  3+ pitting edema Respiratory:  Respirations regular and unlabored, clear to auscultation bilaterally. GI: Soft, nontender, nondistended,  BS + x 4. MS: no deformity or atrophy. Skin: warm and dry, no rash. Neuro:  Strength and sensation are intact. Psych: AAOx3.  Normal affect.  Labs    CBC No results for input(s): WBC, NEUTROABS, HGB, HCT, MCV, PLT in the last 72 hours. Basic Metabolic Panel  Recent Labs  03/05/16 1918 03/06/16 0244  NA 135 137  K 5.1 4.0  CL 104 104  CO2 22 23  GLUCOSE 127* 105*  BUN 63* 57*  CREATININE 2.17* 1.82*  CALCIUM 9.4 9.0   Liver Function Tests  Recent Labs  03/05/16 1918  AST 65*  ALT 52  ALKPHOS 83  BILITOT 1.3*  PROT 5.9*  ALBUMIN 3.3*   No results for input(s): LIPASE, AMYLASE in the last 72 hours. Cardiac Enzymes No results for input(s): CKTOTAL, CKMB, CKMBINDEX, TROPONINI in the last 72 hours. BNP Invalid input(s): POCBNP D-Dimer No results for input(s): DDIMER in the last 72 hours. Hemoglobin A1C No results for input(s): HGBA1C in the last 72 hours. Fasting Lipid Panel No results for input(s): CHOL, HDL, LDLCALC, TRIG, CHOLHDL, LDLDIRECT  in the last 72 hours. Thyroid Function Tests No results for input(s): TSH, T4TOTAL, T3FREE, THYROIDAB in the last 72 hours.  Invalid input(s): FREET3  Telemetry    Atrial fibrillation - Personally Reviewed  ECG    Atrial fibrillation - Personally Reviewed  Radiology    No results found.  Cardiac Studies   pending  Patient Profile      82 y.o. male with a history of severe MR s/p MVR, HTN, dyslipidemia, chronic atrial fibrillation, CAD s/p CABG and ischemic dilated CM with EF 35-30% at time of his colon surgery and chronic systolic CHF. He has a history of VT but due to his age and comorbidities he was not felt to be a candidate for ICD. Last echo 05/2015 showed LV EF of 15-20%, also moderately decreased RVEF. There is now severe pulmonary hypertension. Mechanical mitral valve has normal function. Last seen by Truitt Merle 02/18/16. He has had significant diuresis with the Zaroxolyn but with more fatigue.  Seen  with daughter and wife yesterday.  He had gained 5lb in one week. Decreased appetite. Early satiety. + orthopnea, PND and LE edema. BP in office 74/60 manually and EKG showed afib at rate of 91 bpm.   Admitted for further evaluation and treatment.   Assessment & Plan    1. Acute on hronic systolic CHF - EF of 0000000. Difficult situation given low BP. I think he has reached the endstages of life and not much more to offer given his advanced age and comorbidities. He has severe right and left sided heart failure with failure to thrive, reduced appetite likely due to bowel wall edema from severe pulmonary HTN and right sided HF.  Poor CO resulting in hypotension limits use of HF meds.   I have discussed this with the family today and Alfred Dennis today.  I have recommended Palliative Care consult for end of life decisions.  Will continue IV diuresis as BP tolerates.  Cannot tolerate BB or ACE I/ARB due to soft BP and CKD.  2. Permanent atrial fibrillation - rate controlled. - On coumadin  3. ICM - Not on optimal medical therapy due to low BP. Overall prognosis very poor.  4. CAD s/p CABG - No angina.   5. Hx of MVR - On coumadin per pharmacy with therapeutic INR.  6. Hypotension - BP soft in upper 90's to low 100's.  7.  Acute on CKD stage 3 - creatinine improved from yesterday with diuresis.    Signed, Alfred Him, MD  03/06/2016, 8:09 AM

## 2016-03-07 DIAGNOSIS — Z7189 Other specified counseling: Secondary | ICD-10-CM

## 2016-03-07 DIAGNOSIS — L8990 Pressure ulcer of unspecified site, unspecified stage: Secondary | ICD-10-CM | POA: Insufficient documentation

## 2016-03-07 DIAGNOSIS — Z515 Encounter for palliative care: Secondary | ICD-10-CM

## 2016-03-07 DIAGNOSIS — Z66 Do not resuscitate: Secondary | ICD-10-CM

## 2016-03-07 LAB — BASIC METABOLIC PANEL
Anion gap: 8 (ref 5–15)
BUN: 50 mg/dL — ABNORMAL HIGH (ref 6–20)
CALCIUM: 8.7 mg/dL — AB (ref 8.9–10.3)
CHLORIDE: 104 mmol/L (ref 101–111)
CO2: 26 mmol/L (ref 22–32)
CREATININE: 1.8 mg/dL — AB (ref 0.61–1.24)
GFR calc non Af Amer: 31 mL/min — ABNORMAL LOW (ref >60)
GFR, EST AFRICAN AMERICAN: 36 mL/min — AB (ref >60)
Glucose, Bld: 102 mg/dL — ABNORMAL HIGH (ref 65–99)
Potassium: 3.7 mmol/L (ref 3.5–5.1)
SODIUM: 138 mmol/L (ref 135–145)

## 2016-03-07 LAB — MAGNESIUM: MAGNESIUM: 2.1 mg/dL (ref 1.7–2.4)

## 2016-03-07 LAB — PROTIME-INR
INR: 4.17 — AB
PROTHROMBIN TIME: 40.1 s — AB (ref 11.4–15.2)

## 2016-03-07 MED ORDER — MAGNESIUM SULFATE 2 GM/50ML IV SOLN: 2.0000 g | Freq: Once | INTRAVENOUS | Status: DC

## 2016-03-07 MED ORDER — MAGNESIUM SULFATE 2 GM/50ML IV SOLN: 2.0000 g | Freq: Once | INTRAVENOUS | Status: AC | PRN

## 2016-03-07 NOTE — Progress Notes (Signed)
Patient Name: Alfred Dennis Date of Encounter: 03/07/2016  Primary Cardiologist: Fransico Him, MD  Hospital Problem List     Active Problems:   Coronary artery disease   Chronic atrial fibrillation (HCC)   Acute on chronic systolic CHF (congestive heart failure) (HCC)   Acute cor pulmonale (HCC)   Acute renal failure superimposed on stage 3 chronic kidney disease (HCC)   Pressure injury of skin     Subjective   Feels a little better, eating some lunch. Spoke with daughter and wife in room. Daughter is care giver for both of them (wife has Alzheimer). She is also friends with Truitt Merle.  No SOB sitting up. No CP.   Out 1.8 L  Inpatient Medications    Scheduled Meds: . furosemide  40 mg Intravenous BID  . multivitamin with minerals  1 tablet Oral Daily  . potassium chloride  10 mEq Oral BID  . saccharomyces boulardii  250 mg Oral BID  . simvastatin  10 mg Oral QHS  . sodium chloride flush  3 mL Intravenous Q12H  . Warfarin - Pharmacist Dosing Inpatient   Does not apply q1800   Continuous Infusions:  PRN Meds: sodium chloride, acetaminophen, ondansetron (ZOFRAN) IV, sodium chloride flush   Vital Signs    Vitals:   03/07/16 0300 03/07/16 0413 03/07/16 0803 03/07/16 1206  BP: (!) 89/63 (!) 91/53 101/62 94/60  Pulse: 74 75 88 83  Resp: 12 (!) '9 15 17  '$ Temp:  97.3 F (36.3 C) 97.5 F (36.4 C) 97.6 F (36.4 C)  TempSrc:  Axillary Axillary Oral  SpO2: 100% 100% 100% 100%  Weight:  130 lb 1.1 oz (59 kg)    Height:        Intake/Output Summary (Last 24 hours) at 03/07/16 1316 Last data filed at 03/07/16 0900  Gross per 24 hour  Intake              480 ml  Output             2300 ml  Net            -1820 ml   Filed Weights   03/05/16 1716 03/06/16 0700 03/07/16 0413  Weight: 129 lb 13.6 oz (58.9 kg) 128 lb 12 oz (58.4 kg) 130 lb 1.1 oz (59 kg)    Physical Exam    GEN: Thin, frail, in no acute distress.  HEENT: Grossly normal.  Neck: Supple, no  JVD, carotid bruits, or masses. Cardiac: irregularly irregular, no murmurs, rubs, or gallops. No clubbing, cyanosis.  3+ pitting edema Respiratory:  Respirations regular and unlabored, clear to auscultation bilaterally. GI: Soft, nontender, nondistended, BS + x 4. MS: no deformity or atrophy. Skin: warm and dry, no rash. Neuro:  Strength and sensation are intact. Psych: AAOx3.  Normal affect.  Labs    CBC  Recent Labs  03/06/16 0854  WBC 6.0  HGB 12.0*  HCT 36.0*  MCV 88.5  PLT 982   Basic Metabolic Panel  Recent Labs  03/06/16 0244 03/07/16 0236 03/07/16 0240  NA 137 138  --   K 4.0 3.7  --   CL 104 104  --   CO2 23 26  --   GLUCOSE 105* 102*  --   BUN 57* 50*  --   CREATININE 1.82* 1.80*  --   CALCIUM 9.0 8.7*  --   MG  --   --  2.1   Liver Function Tests  Recent Labs  03/05/16 1918  AST 65*  ALT 52  ALKPHOS 83  BILITOT 1.3*  PROT 5.9*  ALBUMIN 3.3*     Telemetry    Atrial fibrillation with bursts of tachy- Personally Reviewed  ECG    Atrial fibrillation - Personally Reviewed  Radiology    No results found.  Cardiac Studies   ECHO 06/06/15 - Compared to the prior study from 03/12/2014 LVEF is now decreased   estimated at 15-20%, also moderately decreased RVEF.   There is now severe pulmonary hypertension.   Mechanical mitral valve has normal function.  Patient Profile      82 y.o. male with a history of severe MR s/p MVR, HTN, dyslipidemia, chronic atrial fibrillation, CAD s/p CABG and ischemic dilated CM with EF 35-30% at time of his colon surgery and chronic systolic CHF. He has a history of VT but due to his age and comorbidities he was not felt to be a candidate for ICD. Last echo 05/2015 showed LV EF of 15-20%, also moderately decreased RVEF. There is now severe pulmonary hypertension. Mechanical mitral valve has normal function. Last seen by Truitt Merle 02/18/16. He has had significant diuresis with the Zaroxolyn but with more  fatigue.  Seen with daughter and wife yesterday.  He had gained 5lb in one week. Decreased appetite. Early satiety. + orthopnea, PND and LE edema. BP in office 74/60 manually and EKG showed afib at rate of 91 bpm.   Admitted for further evaluation and treatment.   Assessment & Plan    1. Acute on chronic systolic CHF - EF of 45-03%. Difficult situation given low BP. I think he has reached the endstages of life and not much more to offer given his advanced age and comorbidities. He has severe right and left sided heart failure with failure to thrive, reduced appetite likely due to bowel wall edema from severe pulmonary HTN and right sided HF.  Poor CO resulting in hypotension limits use of HF meds.   Palliative Care consult for end of life decisions, appreciate their team. They met this AM.   Will continue IV diuresis as BP tolerates.  Cannot tolerate BB or ACE I/ARB due to soft BP and CKD.  2. Permanent atrial fibrillation - rate controlled. - On coumadin, supratherapeutic  3. ICM - Not on optimal medical therapy due to low BP. Overall prognosis very poor.  4. CAD s/p CABG - No angina.   5. Hx of MVR - On coumadin per pharmacy with therapeutic INR.  6. Hypotension - BP soft in upper 90's to low 100's.  7.  Acute on CKD stage 3 - creatinine improved from yesterday with diuresis.    Signed, Candee Furbish, MD  03/07/2016, 1:16 PM

## 2016-03-07 NOTE — Consult Note (Signed)
Consultation Note Date: 03/07/2016   Patient Name: Alfred Dennis  DOB: 16-Sep-1923  MRN: [id number]  Age / Sex: 82 y.o., male  PCP: Kelton Pillar, MD Referring Physician: Sueanne Margarita, MD  Reason for Consultation: Establishing goals of care  HPI/Patient Profile: 82 y.o. male  with past medical history of severe MR s/p MVR, HTN, atrial fibrillation, CAD s/p CABG, ICM, systolic CHF (EF 94%), h/o VT admitted on 03/05/2016 with increased leg edema and weight, decreased appetite, weakness. Found to have progressive systolic CHF with hypotension. Now with severe pulmonary hypertension as well.   Clinical Assessment and Goals of Care: I met today with Alfred Dennis (who was limited in conversation d/t hard of hearing), Alfred Dennis (unable to participate d/t dementia), daughter Alfred Dennis, and son Alfred Dennis. Alfred Dennis is main caregiver of both her parents.   We discussed Alfred Dennis heart failure progression and gradual functional decline over the past few months and especially over the past month. They understand that he is end stage and life is limited. We discuss care and decisions at EOL. They all confirm DNR. We discuss hospice support (at home and facility), symptom management (morphine for dyspnea), and expectations at EOL.   They have decided on a goal to return home with hospice. Alfred Dennis is mainly concerned about care for his wife first and then also that his finances are in order. They are concerned about how Alfred Dennis will respond to hospice staff coming in the home but wish to get him home (may consider transfer to hospice facility if care becomes too difficult). Await cardiology to optimize health and status and then home with hospice.   Primary Decision Maker NEXT OF KIN wife with Alzheimer's disease, daughter Alfred Dennis and son Alfred Dennis make decisions with Alfred Dennis    SUMMARY OF  RECOMMENDATIONS   - DNR, focus on comfort/QOL - Optimize status and then home with hospice  Code Status/Advance Care Planning:  DNR  Symptom Management:   Currently no active symptoms.  Anticipate increasing dyspnea/SOB: utilize diuretics, oxygen, low dose morphine. Recommend to send home with roxanol 2.5 mg every 4 hours prn SOB.   Palliative Prophylaxis:   Bowel Regimen, Delirium Protocol and Frequent Pain Assessment  Additional Recommendations (Limitations, Scope, Preferences):  Comfort care  Psycho-social/Spiritual:   Desire for further Chaplaincy support:no  Additional Recommendations: Caregiving  Support/Resources and Education on Hospice  Prognosis:   Weeks to months is possible with worsening CHF but could be very short.   Discharge Planning: Home with Hospice      Primary Diagnoses: Present on Admission: . Acute on chronic systolic CHF (congestive heart failure) ([place]) . Chronic atrial fibrillation ([place]) . Coronary artery disease   I have reviewed the medical record, interviewed the patient and family, and examined the patient. The following aspects are pertinent.  Past Medical History:  Diagnosis Date  . Arthritis    "left foot; fingers" (02/03/2012)  . Bacteremia due to group B Streptococcus    a. 05/2015-->outpt abx x 6 wks complicated by  C diff.  . Basal cell carcinoma of shoulder 10/2010  . C. difficile colitis    a. Admit 5/1 - 08/01/2015 - occurred in setting of outpt Abx for Group B Strep Bacteremia.  . Carcinoma of colon ([place])   . Chronic anticoagulation    a. coumadin.  . Chronic atrial fibrillation (HCC)    a. CHA2DS2VASc = 7-->chronic coumadin.  . Chronic systolic CHF (congestive heart failure) ([place])    a. 05/2015 Echo: EF 15-20%, diff HK.  . CKD (chronic kidney disease), stage III   . Coronary artery disease    a. 05/2000 s/p CABG x 4 (LIMA to LAD, SVG to RI, SVG to OM).  Marland Kitchen GIB (gastrointestinal bleeding)   . Hyperlipidemia   .  Hypertension   . Hypertensive heart disease   . Ischemic dilated cardiomyopathy ([place])    a. 01/2012 Echo: EF 25-30%;  b. 05/2015 Echo: EF 15-20%, mild MR, sev dil LA, mod dil RV w/ mod reduced RV fxn, mod dil RA, sev TR, mild PR, PASP 69mHg.  . Mitral valvular regurgitation    a. 05/2000 s/p SJM 33 mm mechanical MV replacement;  b. 05/2015 Echo: mild MR.  . Non-sustained ventricular tachycardia ([place]   . Prediabetes   . Stroke ([facility]    a. 05/2000 after CABG  . Ventricular tachycardia ([place]    a.not felt to be AICD candidate 2/2 advanced age/co-morbidities.   Social History   Social History  . Marital status: Married    Spouse name: BPamala Dennis . Number of children: 2  . Years of education: N/A   Occupational History  . Retired     ACytogeneticist  Social History Main Topics  . Smoking status: Former Smoker    Packs/day: 0.00    Years: 0.00    Types: Cigarettes    Quit date: 03/09/1980  . Smokeless tobacco: Never Used     Comment: 02/03/2012 "quit smoking in my 34's  . Alcohol use No  . Drug use: No  . Sexual activity: No   Other Topics Concern  . Not on file   Social History Narrative   Lives with wife, Alfred [place] single family home   Retired aRegulatory affairs officer  Two grown children-boy & girl   G[place] from [place](except Coumadin)   Family History  Problem Relation Age of Onset  . Heart disease Mother   . Heart disease Sister   . Cancer - Prostate Son    Scheduled Meds: . furosemide  40 mg Intravenous BID  . multivitamin with minerals  1 tablet Oral Daily  . potassium chloride  10 mEq Oral BID  . saccharomyces boulardii  250 mg Oral BID  . simvastatin  10 mg Oral QHS  . sodium chloride flush  3 mL Intravenous Q12H  . Warfarin - Pharmacist Dosing Inpatient   Does not apply q1800   Continuous Infusions: PRN Meds:.sodium chloride, acetaminophen, ondansetron (ZOFRAN) IV, sodium chloride flush Allergies  Allergen Reactions  .  Tape Other (See Comments)    CERTAIN BANDAGES CAN TEAR THE SKIN; PLEASE USE PAPER TAPE   Review of Systems  Constitutional: Positive for activity change, appetite change and fatigue.  Respiratory: Positive for shortness of breath.   Cardiovascular: Positive for leg swelling.  Neurological: Positive for weakness.    Physical Exam  Constitutional: He appears well-developed.  HENT:  Head: Normocephalic and atraumatic.  Cardiovascular: An irregularly irregular rhythm present.  Pulmonary/Chest: Effort  normal. No accessory muscle usage. No tachypnea. No respiratory distress.  Abdominal: Normal appearance.  Neurological: He is alert.  Mostly oriented to situation  Nursing note and vitals reviewed.   Vital Signs: BP 101/62 (BP Location: Right Arm)   Pulse 88   Temp 97.5 F (36.4 C) (Axillary)   Resp 15   Ht _0  (1.6 m)   Wt 59 kg (130 lb 1.1 oz)   SpO2 100%   BMI 23.04 kg/m  Pain Assessment: No/denies pain   Pain Score: 0-No pain   SpO2: SpO2: 100 % O2 Device:SpO2: 100 % O2 Flow Rate: .   IO: Intake/output summary:  Intake/Output Summary (Last 24 hours) at 03/07/16 1118 Last data filed at 03/07/16 0900  Gross per 24 hour  Intake              480 ml  Output             2300 ml  Net            -1820 ml    LBM: Last BM Date: 03/05/16 Baseline Weight: Weight: 58.9 kg (129 lb 13.6 oz) Most recent weight: Weight: 59 kg (130 lb 1.1 oz)     Palliative Assessment/Data:    Time In: 1000 Time Out: 1120 Time Total: 19mn Greater than 50%  of this time was spent counseling and coordinating care related to the above assessment and plan.  Signed by: AVinie Sill NP Palliative Medicine Team Pager # [phone](M-F 8a-5p) Team Phone # [phone](Nights/Weekends)

## 2016-03-07 NOTE — Progress Notes (Addendum)
CRITICAL VALUE ALERT  Critical value received:  INR 4.17  Date of notification:  03/07/16  Time of notification:  0621  Critical value read back:Yes.    Nurse who received alert:  Legrand Pitts RN  MD notified (1st page):  Dr. Lamona Curl  Time of first page:  [phone]  Time MD responded: N/A;  MDs changing shifts, will pass on to day shift to notify rounding MD. No s/s bleeding.

## 2016-03-07 NOTE — Progress Notes (Addendum)
Patient having frequent runs of wide complex SVT. Text page to cardiology fellow to see if serum mag should be checked; K stable at 4.0. Pt is asymptomatic at this time. Will continue to monitor.   Update: MD placed orders for Mg level and repletion of if necessary.

## 2016-03-07 NOTE — Progress Notes (Signed)
[facility] for Coumadin Indication: mechanical heart valve, Afib  Allergies  Allergen Reactions  . Tape Other (See Comments)    CERTAIN BANDAGES CAN TEAR THE SKIN; PLEASE USE PAPER TAPE    Patient Measurements: Height: 5\' 3"  (160 cm) Weight: 130 lb 1.1 oz (59 kg) IBW/kg (Calculated) : 56.9   Vital Signs: Temp: 97.5 F (36.4 C) (12/30 0803) Temp Source: Axillary (12/30 0803) BP: 101/62 (12/30 0803) Pulse Rate: 88 (12/30 0803)  Labs:  Recent Labs  03/05/16 1918 03/06/16 0244 03/06/16 0854 03/07/16 0236  HGB  --   --  12.0*  --   HCT  --   --  36.0*  --   PLT  --   --  150  --   LABPROT 36.7* 37.9*  --  40.1*  INR 3.59 3.74  --  4.17*  CREATININE 2.17* 1.82*  --  1.80*    Estimated Creatinine Clearance: 22 mL/min (by C-G formula based on SCr of 1.8 mg/dL (H)).   Medical History: Past Medical History:  Diagnosis Date  . Arthritis    "left foot; fingers" (02/03/2012)  . Bacteremia due to group B Streptococcus    a. 05/2015-->outpt abx x 6 wks complicated by C diff.  . Basal cell carcinoma of shoulder 10/2010  . C. difficile colitis    a. Admit 5/1 - 08/01/2015 - occurred in setting of outpt Abx for Group B Strep Bacteremia.  . Carcinoma of colon ([place])   . Chronic anticoagulation    a. coumadin.  . Chronic atrial fibrillation (HCC)    a. CHA2DS2VASc = 7-->chronic coumadin.  . Chronic systolic CHF (congestive heart failure) ([place])    a. 05/2015 Echo: EF 15-20%, diff HK.  . CKD (chronic kidney disease), stage III   . Coronary artery disease    a. 05/2000 s/p CABG x 4 (LIMA to LAD, SVG to RI, SVG to OM).  Marland Kitchen GIB (gastrointestinal bleeding)   . Hyperlipidemia   . Hypertension   . Hypertensive heart disease   . Ischemic dilated cardiomyopathy ([place])    a. 01/2012 Echo: EF 25-30%;  b. 05/2015 Echo: EF 15-20%, mild MR, sev dil LA, mod dil RV w/ mod reduced RV fxn, mod dil RA, sev TR, mild PR, PASP 34mmHg.  . Mitral valvular  regurgitation    a. 05/2000 s/p SJM 33 mm mechanical MV replacement;  b. 05/2015 Echo: mild MR.  . Non-sustained ventricular tachycardia ([place])   . Prediabetes   . Stroke [facility])    a. 05/2000 after CABG  . Ventricular tachycardia ([place])    a.not felt to be AICD candidate 2/2 advanced age/co-morbidities.    Medications:  Scheduled:  . furosemide  40 mg Intravenous BID  . multivitamin with minerals  1 tablet Oral Daily  . potassium chloride  10 mEq Oral BID  . saccharomyces boulardii  250 mg Oral BID  . simvastatin  10 mg Oral QHS  . sodium chloride flush  3 mL Intravenous Q12H  . Warfarin - Pharmacist Dosing Inpatient   Does not apply q1800    Assessment: 82yo male presenting with LE edema.  Pt with mechanical MVR on Coumadin pta, goal INR 2.5-3.5.  Per Coumadin clinic visit on 12/26 his home dose is 2.5mg  daily x 1.25mg  on MWF. Today's INR elevated at 4.17. No issues noted.   Goal of Therapy:  INR 2.5-3.5 Monitor platelets by anticoagulation protocol: Yes   Plan:  No Coumadin tonight. Daily PT/INR.  Angela Burke,  PharmD, BCPS Pharmacy Resident Pager: [phone]  03/07/2016 9:59 AM

## 2016-03-08 LAB — BASIC METABOLIC PANEL
ANION GAP: 7 (ref 5–15)
BUN: 46 mg/dL — AB (ref 6–20)
CALCIUM: 8.6 mg/dL — AB (ref 8.9–10.3)
CO2: 28 mmol/L (ref 22–32)
Chloride: 102 mmol/L (ref 101–111)
Creatinine, Ser: 1.57 mg/dL — ABNORMAL HIGH (ref 0.61–1.24)
GFR calc Af Amer: 43 mL/min — ABNORMAL LOW (ref >60)
GFR, EST NON AFRICAN AMERICAN: 37 mL/min — AB (ref >60)
Glucose, Bld: 107 mg/dL — ABNORMAL HIGH (ref 65–99)
POTASSIUM: 3.6 mmol/L (ref 3.5–5.1)
SODIUM: 137 mmol/L (ref 135–145)

## 2016-03-08 LAB — PROTIME-INR
INR: 2.45
PROTHROMBIN TIME: 27.1 s — AB (ref 11.4–15.2)

## 2016-03-08 MED ORDER — WARFARIN SODIUM 2.5 MG PO TABS
2.5000 mg | ORAL_TABLET | Freq: Once | ORAL | Status: AC
  Administered 2016-03-08: 2.5 mg via ORAL
  Filled 2016-03-08: qty 1

## 2016-03-08 NOTE — Progress Notes (Signed)
Patient Name: Alfred Dennis Date of Encounter: 03/08/2016  Primary Cardiologist: Fransico Him, MD  Hospital Problem List     Active Problems:   Coronary artery disease   Chronic atrial fibrillation (HCC)   Acute on chronic systolic CHF (congestive heart failure) (HCC)   Acute cor pulmonale (HCC)   Acute renal failure superimposed on stage 3 chronic kidney disease ([place])   Pressure injury of skin   DNR (do not resuscitate)   Goals of care, counseling/discussion   Palliative care encounter     Subjective   Sleeping today.  Family not in the room. Was awake and interactive earlier today. No SOB sitting up. No CP.   Out 4.3 L since admission  Inpatient Medications    Scheduled Meds: . furosemide  40 mg Intravenous BID  . multivitamin with minerals  1 tablet Oral Daily  . potassium chloride  10 mEq Oral BID  . saccharomyces boulardii  250 mg Oral BID  . simvastatin  10 mg Oral QHS  . sodium chloride flush  3 mL Intravenous Q12H  . warfarin  2.5 mg Oral ONCE-1800  . Warfarin - Pharmacist Dosing Inpatient   Does not apply q1800   Continuous Infusions:  PRN Meds: sodium chloride, acetaminophen, ondansetron (ZOFRAN) IV, sodium chloride flush   Vital Signs    Vitals:   03/08/16 0700 03/08/16 0800 03/08/16 0833 03/08/16 1140  BP:  (!) 94/53 (!) 94/53 114/82  Pulse: 84 77  81  Resp: 14 12  (!) 21  Temp:   98.1 F (36.7 C) 97.3 F (36.3 C)  TempSrc:   Oral Axillary  SpO2: 91% 91%  100%  Weight:      Height:        Intake/Output Summary (Last 24 hours) at 03/08/16 1416 Last data filed at 03/08/16 0800  Gross per 24 hour  Intake              123 ml  Output             2875 ml  Net            -2752 ml   Filed Weights   03/06/16 0700 03/07/16 0413 03/08/16 0500  Weight: 128 lb 12 oz (58.4 kg) 130 lb 1.1 oz (59 kg) 123 lb 14.4 oz (56.2 kg)    Physical Exam    GEN: Thin, frail, in no acute distress.  HEENT: Grossly normal.  Neck: Supple, no JVD, carotid  bruits, or masses. Cardiac: irregularly irregular, no murmurs, rubs, or gallops. No clubbing, cyanosis.  3+ pitting edema Respiratory:  Respirations regular and unlabored, clear to auscultation bilaterally. GI: Soft, nontender, nondistended, BS + x 4. MS: no deformity or atrophy. Skin: warm and dry, no rash. Neuro:  Strength and sensation are intact. Psych: AAOx3.  Normal affect.  Labs    CBC  Recent Labs  03/06/16 0854  WBC 6.0  HGB 12.0*  HCT 36.0*  MCV 88.5  PLT [id number]   Basic Metabolic Panel  Recent Labs  03/07/16 0236 03/07/16 0240 03/08/16 0303  NA 138  --  137  K 3.7  --  3.6  CL 104  --  102  CO2 26  --  28  GLUCOSE 102*  --  107*  BUN 50*  --  46*  CREATININE 1.80*  --  1.57*  CALCIUM 8.7*  --  8.6*  MG  --  2.1  --    Liver Function Tests  Recent Labs  03/05/16 1918  AST 65*  ALT 52  ALKPHOS 83  BILITOT 1.3*  PROT 5.9*  ALBUMIN 3.3*     Telemetry    Atrial fibrillation with bursts of tachy- Personally Reviewed  ECG    Atrial fibrillation - Personally Reviewed  Radiology    No results found.  Cardiac Studies   ECHO 06/06/15 - Compared to the prior study from 03/12/2014 LVEF is now decreased   estimated at 15-20%, also moderately decreased RVEF.   There is now severe pulmonary hypertension.   Mechanical mitral valve has normal function.  Patient Profile      82 y.o. male with a history of severe MR s/p MVR, HTN, dyslipidemia, chronic atrial fibrillation, CAD s/p CABG and ischemic dilated CM with EF 35-30% at time of his colon surgery and chronic systolic CHF. He has a history of VT but due to his age and comorbidities he was not felt to be a candidate for ICD. Last echo 05/2015 showed LV EF of 15-20%, also moderately decreased RVEF. There is now severe pulmonary hypertension. Mechanical mitral valve has normal function. Last seen by Truitt Merle 02/18/16. He has had significant diuresis with the Zaroxolyn but with more fatigue.  Seen  with daughter and wife yesterday.  He had gained 5lb in one week. Decreased appetite. Early satiety. + orthopnea, PND and LE edema. BP in office 74/60 manually and EKG showed afib at rate of 91 bpm.   Admitted for further evaluation and treatment.   Assessment & Plan    1. Acute on chronic systolic CHF - EF of 0000000. Difficult situation given low BP. I think he has reached the endstages of life and not much more to offer given his advanced age and comorbidities. He has severe right and left sided heart failure with failure to thrive, reduced appetite likely due to bowel wall edema from severe pulmonary HTN and right sided HF.  Poor CO resulting in hypotension limits use of HF meds.   Plan to discharge home with hospice with palliative recs.  Chikita Dogan continue IV diuresis as BP tolerates.  May be able to return home tomorrow or soon after if hospice can be arranged.  Appears euvolemic on exam. Cannot tolerate BB or ACE I/ARB due to soft BP and CKD.  2. Permanent atrial fibrillation - rate controlled. - On coumadin.  3. ICM - BP remains low and thus not able to tolerate OMT. Overall prognosis very poor.  4. CAD s/p CABG - No angina.   5. Hx of MVR - On coumadin per pharmacy with therapeutic INR.  6. Hypotension - BP improved into the low 110s. Continue current management  7.  Acute on CKD stage 3 - creatinine continues to improve with diuresis.    Signed, Doris Gruhn Meredith Leeds, MD  03/08/2016, 2:16 PM

## 2016-03-08 NOTE — Progress Notes (Signed)
[place] for Coumadin Indication: mechanical heart valve, Afib  Allergies  Allergen Reactions  . Tape Other (See Comments)    CERTAIN BANDAGES CAN TEAR THE SKIN; PLEASE USE PAPER TAPE    Patient Measurements: Height: 5\' 3"  (160 cm) Weight: 123 lb 14.4 oz (56.2 kg) IBW/kg (Calculated) : 56.9   Vital Signs: Temp: 98.1 F (36.7 C) (12/31 0833) Temp Source: Oral (12/31 0833) BP: 94/53 (12/31 [id number]) Pulse Rate: 77 (12/31 0800)  Labs:  Recent Labs  03/06/16 0244 03/06/16 0854 03/07/16 0236 03/08/16 0303  HGB  --  12.0*  --   --   HCT  --  36.0*  --   --   PLT  --  150  --   --   LABPROT 37.9*  --  40.1* 27.1*  INR 3.74  --  4.17* 2.45  CREATININE 1.82*  --  1.80* 1.57*    Estimated Creatinine Clearance: 24.9 mL/min (by C-G formula based on SCr of 1.57 mg/dL (H)).   Medical History: Past Medical History:  Diagnosis Date  . Arthritis    "left foot; fingers" (02/03/2012)  . Bacteremia due to group B Streptococcus    a. 05/2015-->outpt abx x 6 wks complicated by C diff.  . Basal cell carcinoma of shoulder 10/2010  . C. difficile colitis    a. Admit 5/1 - 08/01/2015 - occurred in setting of outpt Abx for Group B Strep Bacteremia.  . Carcinoma of colon ([place])   . Chronic anticoagulation    a. coumadin.  . Chronic atrial fibrillation (HCC)    a. CHA2DS2VASc = 7-->chronic coumadin.  . Chronic systolic CHF (congestive heart failure) ([place])    a. 05/2015 Echo: EF 15-20%, diff HK.  . CKD (chronic kidney disease), stage III   . Coronary artery disease    a. 05/2000 s/p CABG x 4 (LIMA to LAD, SVG to RI, SVG to OM).  Marland Kitchen GIB (gastrointestinal bleeding)   . Hyperlipidemia   . Hypertension   . Hypertensive heart disease   . Ischemic dilated cardiomyopathy ([place])    a. 01/2012 Echo: EF 25-30%;  b. 05/2015 Echo: EF 15-20%, mild MR, sev dil LA, mod dil RV w/ mod reduced RV fxn, mod dil RA, sev TR, mild PR, PASP 31mmHg.  . Mitral valvular  regurgitation    a. 05/2000 s/p SJM 33 mm mechanical MV replacement;  b. 05/2015 Echo: mild MR.  . Non-sustained ventricular tachycardia ([place])   . Prediabetes   . Stroke [facility])    a. 05/2000 after CABG  . Ventricular tachycardia ([place])    a.not felt to be AICD candidate 2/2 advanced age/co-morbidities.    Medications:  Scheduled:  . furosemide  40 mg Intravenous BID  . multivitamin with minerals  1 tablet Oral Daily  . potassium chloride  10 mEq Oral BID  . saccharomyces boulardii  250 mg Oral BID  . simvastatin  10 mg Oral QHS  . sodium chloride flush  3 mL Intravenous Q12H  . Warfarin - Pharmacist Dosing Inpatient   Does not apply q1800    Assessment: 82yo male presenting with LE edema.  Pt with mechanical MVR on Coumadin pta, goal INR 2.5-3.5.  Per Coumadin clinic visit on 12/26 his home dose is 2.5mg  daily x 1.25mg  on MWF. Pt's INR has been supra-therapeutic this admission but now slightly below goal at 2.45. No issues noted.   Goal of Therapy:  INR 2.5-3.5 Monitor platelets by anticoagulation protocol: Yes   Plan:  Warfarin po 2.5 mg x 1 tonight Daily INR; monitor CBC and for S&S of bleed  Angela Burke, PharmD, BCPS Pharmacy Resident Pager: [phone]  03/08/2016 9:05 AM

## 2016-03-08 NOTE — Discharge Instructions (Signed)
Information on my medicine - Coumadin   (Warfarin)  This medication education was reviewed with me or my healthcare representative as part of my discharge preparation.  The pharmacist that spoke with me during my hospital stay was:  Myrene Galas, [facility]  Why was Coumadin prescribed for you? Coumadin was prescribed for you because you have a blood clot or a medical condition that can cause an increased risk of forming blood clots. Blood clots can cause serious health problems by blocking the flow of blood to the heart, lung, or brain. Coumadin can prevent harmful blood clots from forming. As a reminder your indication for Coumadin is:   Blood Clot Prevention After Heart Valve Surgery  What test will check on my response to Coumadin? While on Coumadin (warfarin) you will need to have an INR test regularly to ensure that your dose is keeping you in the desired range. The INR (international normalized ratio) number is calculated from the result of the laboratory test called prothrombin time (PT).  If an INR APPOINTMENT HAS NOT ALREADY BEEN MADE FOR YOU please schedule an appointment to have this lab work done by your health care provider within 7 days. Your INR goal is usually a number between:  2 to 3 or your provider may give you a more narrow range like 2-2.5.  Ask your health care provider during an office visit what your goal INR is.  What  do you need to  know  About  COUMADIN? Take Coumadin (warfarin) exactly as prescribed by your healthcare provider about the same time each day.  DO NOT stop taking without talking to the doctor who prescribed the medication.  Stopping without other blood clot prevention medication to take the place of Coumadin may increase your risk of developing a new clot or stroke.  Get refills before you run out.  What do you do if you miss a dose? If you miss a dose, take it as soon as you remember on the same day then continue your regularly scheduled regimen the next  day.  Do not take two doses of Coumadin at the same time.  Important Safety Information A possible side effect of Coumadin (Warfarin) is an increased risk of bleeding. You should call your healthcare provider right away if you experience any of the following: ? Bleeding from an injury or your nose that does not stop. ? Unusual colored urine (red or dark brown) or unusual colored stools (red or black). ? Unusual bruising for unknown reasons. ? A serious fall or if you hit your head (even if there is no bleeding).  Some foods or medicines interact with Coumadin (warfarin) and might alter your response to warfarin. To help avoid this: ? Eat a balanced diet, maintaining a consistent amount of Vitamin K. ? Notify your provider about major diet changes you plan to make. ? Avoid alcohol or limit your intake to 1 drink for women and 2 drinks for men per day. (1 drink is 5 oz. wine, 12 oz. beer, or 1.5 oz. liquor.)  Make sure that ANY health care provider who prescribes medication for you knows that you are taking Coumadin (warfarin).  Also make sure the healthcare provider who is monitoring your Coumadin knows when you have started a new medication including herbals and non-prescription products.  Coumadin (Warfarin)  Major Drug Interactions  Increased Warfarin Effect Decreased Warfarin Effect  Alcohol (large quantities) Antibiotics (esp. Septra/Bactrim, Flagyl, Cipro) Amiodarone (Cordarone) Aspirin (ASA) Cimetidine (Tagamet) Megestrol (Megace) NSAIDs (  ibuprofen, naproxen, etc.) °Piroxicam (Feldene) °Propafenone (Rythmol SR) °Propranolol (Inderal) °Isoniazid (INH) °Posaconazole (Noxafil) Barbiturates (Phenobarbital) °Carbamazepine (Tegretol) °Chlordiazepoxide (Librium) °Cholestyramine (Questran) °Griseofulvin °Oral Contraceptives °Rifampin °Sucralfate (Carafate) °Vitamin K  ° °Coumadin® (Warfarin) Major Herbal Interactions  °Increased Warfarin Effect Decreased Warfarin Effect   °Garlic °Ginseng °Ginkgo biloba Coenzyme Q10 °Green tea °St. John’s wort   ° °Coumadin® (Warfarin) FOOD Interactions  °Eat a consistent number of servings per week of foods HIGH in Vitamin K °(1 serving = ½ cup)  °Collards (cooked, or boiled & drained) °Kale (cooked, or boiled & drained) °Mustard greens (cooked, or boiled & drained) °Parsley *serving size only = ¼ cup °Spinach (cooked, or boiled & drained) °Swiss chard (cooked, or boiled & drained) °Turnip greens (cooked, or boiled & drained)  °Eat a consistent number of servings per week of foods MEDIUM-HIGH in Vitamin K °(1 serving = 1 cup)  °Asparagus (cooked, or boiled & drained) °Broccoli (cooked, boiled & drained, or raw & chopped) °Brussel sprouts (cooked, or boiled & drained) *serving size only = ½ cup °Lettuce, raw (green leaf, endive, romaine) °Spinach, raw °Turnip greens, raw & chopped  ° °These websites have more information on Coumadin (warfarin):  www.coumadin.com; °www.ahrq.gov/consumer/coumadin.htm; ° ° °

## 2016-03-08 NOTE — Progress Notes (Signed)
Pt had a 7 beat run of [facility], asymptomatic, text page sent to Dr. Raiford Simmonds.

## 2016-03-09 DIAGNOSIS — Z515 Encounter for palliative care: Secondary | ICD-10-CM

## 2016-03-09 DIAGNOSIS — Z66 Do not resuscitate: Secondary | ICD-10-CM

## 2016-03-09 DIAGNOSIS — Z7189 Other specified counseling: Secondary | ICD-10-CM

## 2016-03-09 LAB — BASIC METABOLIC PANEL
ANION GAP: 7 (ref 5–15)
BUN: 39 mg/dL — ABNORMAL HIGH (ref 6–20)
CHLORIDE: 100 mmol/L — AB (ref 101–111)
CO2: 29 mmol/L (ref 22–32)
Calcium: 8.7 mg/dL — ABNORMAL LOW (ref 8.9–10.3)
Creatinine, Ser: 1.45 mg/dL — ABNORMAL HIGH (ref 0.61–1.24)
GFR calc Af Amer: 47 mL/min — ABNORMAL LOW (ref >60)
GFR calc non Af Amer: 41 mL/min — ABNORMAL LOW (ref >60)
Glucose, Bld: 104 mg/dL — ABNORMAL HIGH (ref 65–99)
POTASSIUM: 3.9 mmol/L (ref 3.5–5.1)
Sodium: 136 mmol/L (ref 135–145)

## 2016-03-09 LAB — PROTIME-INR
INR: 1.91
Prothrombin Time: 22.2 seconds — ABNORMAL HIGH (ref 11.4–15.2)

## 2016-03-09 MED ORDER — ENOXAPARIN SODIUM 60 MG/0.6ML ~~LOC~~ SOLN
60.0000 mg | SUBCUTANEOUS | Status: DC
  Administered 2016-03-09 – 2016-03-10 (×2): 60 mg via SUBCUTANEOUS
  Filled 2016-03-09 (×2): qty 0.6

## 2016-03-09 MED ORDER — POLYVINYL ALCOHOL 1.4 % OP SOLN
1.0000 [drp] | OPHTHALMIC | Status: DC | PRN
  Filled 2016-03-09: qty 15

## 2016-03-09 MED ORDER — WARFARIN SODIUM 2.5 MG PO TABS
1.2500 mg | ORAL_TABLET | Freq: Once | ORAL | Status: AC
  Administered 2016-03-09: 1.25 mg via ORAL
  Filled 2016-03-09 (×2): qty 0.5

## 2016-03-09 NOTE — Progress Notes (Addendum)
Patient Name: Alfred Dennis Date of Encounter: 03/09/2016  Primary Cardiologist: Fransico Him, MD  Hospital Problem List     Active Problems:   Coronary artery disease   Chronic atrial fibrillation (HCC)   Acute on chronic systolic CHF (congestive heart failure) (HCC)   Acute cor pulmonale (HCC)   Acute renal failure superimposed on stage 3 chronic kidney disease ([place])   Pressure injury of skin   DNR (do not resuscitate)   Goals of care, counseling/discussion   Palliative care encounter     Subjective   Feels a little better, eating some lunch. Spoke with daughter and wife in room. Daughter is care giver for both of them (wife has Alzheimer). She is also friends with Truitt Merle.  No SOB sitting up. No CP.   Out 1.3L yesterday and net neg 5.4L since admit  Inpatient Medications    Scheduled Meds: . furosemide  40 mg Intravenous BID  . multivitamin with minerals  1 tablet Oral Daily  . potassium chloride  10 mEq Oral BID  . saccharomyces boulardii  250 mg Oral BID  . simvastatin  10 mg Oral QHS  . sodium chloride flush  3 mL Intravenous Q12H  . Warfarin - Pharmacist Dosing Inpatient   Does not apply q1800   Continuous Infusions:  PRN Meds: sodium chloride, acetaminophen, ondansetron (ZOFRAN) IV, sodium chloride flush   Vital Signs    Vitals:   03/09/16 0000 03/09/16 0400 03/09/16 0500 03/09/16 0700  BP: (!) 100/51 91/61 108/62 (!) 88/56  Pulse: 74 72 71 69  Resp: 17 (!) 26 16 14   Temp:   97.3 F (36.3 C) 97.6 F (36.4 C)  TempSrc:   Axillary Oral  SpO2: 98% 99% 97% 98%  Weight:   125 lb 10.6 oz (57 kg)   Height:        Intake/Output Summary (Last 24 hours) at 03/09/16 0842 Last data filed at 03/09/16 0500  Gross per 24 hour  Intake              950 ml  Output             2025 ml  Net            -1075 ml   Filed Weights   03/07/16 0413 03/08/16 0500 03/09/16 0500  Weight: 130 lb 1.1 oz (59 kg) 123 lb 14.4 oz (56.2 kg) 125 lb 10.6 oz (57 kg)     Physical Exam    GEN: Thin, frail, in no acute distress.  HEENT: Grossly normal.  Neck: Supple, no JVD, carotid bruits, or masses. Cardiac: irregularly irregular, no murmurs, rubs, or gallops. No clubbing, cyanosis.  1+ edema Respiratory:  Respirations regular and unlabored, clear to auscultation bilaterally. GI: Soft, nontender, nondistended, BS + x 4. MS: no deformity or atrophy. Skin: warm and dry, no rash. Neuro:  Strength and sensation are intact. Psych: AAOx3.  Normal affect.  Labs    CBC  Recent Labs  03/06/16 0854  WBC 6.0  HGB 12.0*  HCT 36.0*  MCV 88.5  PLT [id number]   Basic Metabolic Panel  Recent Labs  03/07/16 0240 03/08/16 0303 03/09/16 0222  NA  --  137 136  K  --  3.6 3.9  CL  --  102 100*  CO2  --  28 29  GLUCOSE  --  107* 104*  BUN  --  46* 39*  CREATININE  --  1.57* 1.45*  CALCIUM  --  8.6*  8.7*  MG 2.1  --   --    Liver Function Tests No results for input(s): AST, ALT, ALKPHOS, BILITOT, PROT, ALBUMIN in the last 72 hours.   Telemetry    Atrial fibrillation with bursts of tachy- Personally Reviewed  ECG    Atrial fibrillation - Personally Reviewed  Radiology    No results found.  Cardiac Studies   ECHO 06/06/15 - Compared to the prior study from 03/12/2014 LVEF is now decreased   estimated at 15-20%, also moderately decreased RVEF.   There is now severe pulmonary hypertension.   Mechanical mitral valve has normal function.  Patient Profile      83 y.o. male with a history of severe MR s/p MVR, HTN, dyslipidemia, chronic atrial fibrillation, CAD s/p CABG and ischemic dilated CM with EF 35-30% at time of his colon surgery and chronic systolic CHF. He has a history of VT but due to his age and comorbidities he was not felt to be a candidate for ICD. Last echo 05/2015 showed LV EF of 15-20%, also moderately decreased RVEF. There is now severe pulmonary hypertension. Mechanical mitral valve has normal function. Last seen by Truitt Merle 02/18/16. He has had significant diuresis with the Zaroxolyn but with more fatigue.  Seen with daughter and wife yesterday.  He had gained 5lb in one week. Decreased appetite. Early satiety. + orthopnea, PND and LE edema. BP in office 74/60 manually and EKG showed afib at rate of 91 bpm.   Admitted for further evaluation and treatment.   Assessment & Plan    1. Acute on chronic systolic CHF - EF of 0000000. Difficult situation given low BP. I think he has reached the endstages of life and not much more to offer given his advanced age and comorbidities. He has severe right and left sided heart failure with failure to thrive, reduced appetite likely due to bowel wall edema from severe pulmonary HTN and right sided HF.  Poor CO resulting in hypotension limits use of HF meds.    LE edema much improved.  Plan to discharge home with hospice with palliative recs.  Will continue IV diuresis as BP tolerates.  May be able to return home today or soon after if hospice can be arranged.  Appears euvolemic on exam. Cannot tolerate BB or ACE I/ARB due to soft BP and CKD.  2. Permanent atrial fibrillation - rate controlled. - On coumadin, subtherapeutic today - pharmacy dosing  3. ICM - Not on optimal medical therapy due to low BP. Overall prognosis very poor.  4. CAD s/p CABG - No angina.   5. Hx of MVR - On coumadin per pharmacy with therapeutic INR.  6. Hypotension - BP soft in upper 90's to low 100's.  7.  Acute on CKD stage 3 - creatinine improved from yesterday with diuresis.    Signed, Fransico Him, MD  03/09/2016, 8:42 AM

## 2016-03-09 NOTE — Progress Notes (Signed)
Pt had 49 beats of Vtach, asymptomatic. VSS. Kerin Ransom, NP paged and episode reported to night shift RN.

## 2016-03-09 NOTE — Progress Notes (Addendum)
[place] for Coumadin Indication: mechanical heart valve, Afib  Allergies  Allergen Reactions  . Tape Other (See Comments)    CERTAIN BANDAGES CAN TEAR THE SKIN; PLEASE USE PAPER TAPE    Patient Measurements: Height: 5\' 3"  (160 cm) Weight: 125 lb 10.6 oz (57 kg) IBW/kg (Calculated) : 56.9  Vital Signs: Temp: 97.6 F (36.4 C) (01/01 0700) Temp Source: Oral (01/01 0700) BP: 104/64 (01/01 0800) Pulse Rate: 80 (01/01 0800)   Recent Labs  03/07/16 0236 03/08/16 0303 03/09/16 0222  LABPROT 40.1* 27.1* 22.2*  INR 4.17* 2.45 1.91  CREATININE 1.80* 1.57* 1.45*    Medical History: Past Medical History:  Diagnosis Date  . Arthritis    "left foot; fingers" (02/03/2012)  . Bacteremia due to group B Streptococcus    a. 05/2015-->outpt abx x 6 wks complicated by C diff.  . Basal cell carcinoma of shoulder 10/2010  . C. difficile colitis    a. Admit 5/1 - 08/01/2015 - occurred in setting of outpt Abx for Group B Strep Bacteremia.  . Carcinoma of colon ([place])   . Chronic anticoagulation    a. coumadin.  . Chronic atrial fibrillation (HCC)    a. CHA2DS2VASc = 7-->chronic coumadin.  . Chronic systolic CHF (congestive heart failure) ([place])    a. 05/2015 Echo: EF 15-20%, diff HK.  . CKD (chronic kidney disease), stage III   . Coronary artery disease    a. 05/2000 s/p CABG x 4 (LIMA to LAD, SVG to RI, SVG to OM).  Marland Kitchen GIB (gastrointestinal bleeding)   . Hyperlipidemia   . Hypertension   . Hypertensive heart disease   . Ischemic dilated cardiomyopathy ([place])    a. 01/2012 Echo: EF 25-30%;  b. 05/2015 Echo: EF 15-20%, mild MR, sev dil LA, mod dil RV w/ mod reduced RV fxn, mod dil RA, sev TR, mild PR, PASP 3mmHg.  . Mitral valvular regurgitation    a. 05/2000 s/p SJM 33 mm mechanical MV replacement;  b. 05/2015 Echo: mild MR.  . Non-sustained ventricular tachycardia ([place])   . Prediabetes   . Stroke [facility])    a. 05/2000 after CABG  . Ventricular  tachycardia ([place])    a.not felt to be AICD candidate 2/2 advanced age/co-morbidities.    Medications:  Scheduled:  . furosemide  40 mg Intravenous BID  . multivitamin with minerals  1 tablet Oral Daily  . potassium chloride  10 mEq Oral BID  . saccharomyces boulardii  250 mg Oral BID  . simvastatin  10 mg Oral QHS  . sodium chloride flush  3 mL Intravenous Q12H  . Warfarin - Pharmacist Dosing Inpatient   Does not apply q1800    Assessment: 83 yo male presenting with LE edema.  Pt with mechanical MVR on warfarin prior to admission. INR had been supratherapeutic for most of admission, warfarin doses were held on 12/29, 12/30, warfarin was resumed yesterday; however, now the INR is subtherapeutic at 1.9. Will bridge with Lovenox as pt is close to being discharged.   PTA warfarin dose: 1.25 mg Mon/Wed/Fri, 2.5 mg all other days   Goal of Therapy:  INR 2.5-3.5 Monitor platelets by anticoagulation protocol: Yes   Plan:  Warfarin po 2.5 mg po x1 Daily INR Lovenox 60 mg [place] q24h Stop Lovenox when INR > 2.5 Would discharge patient on lower dosing regimen than what he came in on     Hughes Better, PharmD, BCPS Clinical Pharmacist 03/09/2016 10:26 AM

## 2016-03-09 NOTE — Progress Notes (Addendum)
                                                   Daily Progress Note   Patient Name: Alfred Dennis       Date: 03/09/2016 DOB: 03/11/1923  Age: 83 y.o. MRN#: [id number] Attending Physician: Traci R Turner, MD Primary Care Physician: GRIFFIN,ELAINE COLLINS, MD Admit Date: 03/05/2016  Reason for Consultation/Follow-up: Disposition and Psychosocial/spiritual support  Subjective: Alfred Dennis is doing well this morning and denies dyspnea. He does report a right eye burning and itching, which he believes is from the overly dry air in the hospital. He previously tried a warm moist cloth over his eyes, which helped immensely. His wife and daughter are at the bedside.  Length of Stay: 4  Current Medications: Scheduled Meds:  . furosemide  40 mg Intravenous BID  . multivitamin with minerals  1 tablet Oral Daily  . potassium chloride  10 mEq Oral BID  . saccharomyces boulardii  250 mg Oral BID  . simvastatin  10 mg Oral QHS  . sodium chloride flush  3 mL Intravenous Q12H  . Warfarin - Pharmacist Dosing Inpatient   Does not apply q1800    Continuous Infusions:   PRN Meds: sodium chloride, acetaminophen, ondansetron (ZOFRAN) IV, sodium chloride flush  Physical Exam  Constitutional: He is oriented to person, place, and time. He has a sickly appearance.  Frail elderly man sitting comfortably in bed  HENT:  Head: Normocephalic and atraumatic.  Mouth/Throat: Mucous membranes are dry.  HOH  Eyes: EOM are normal.  Neck: Normal range of motion.  Cardiovascular: An irregularly irregular rhythm present.  Pulmonary/Chest: Effort normal. No respiratory distress.  Abdominal: Soft.  Musculoskeletal: He exhibits edema (+1 BLE).  Arthritis limits movement, generalized weakness  Neurological: He is alert and oriented to person, place, and time.  Skin: Skin is warm and dry. There is pallor.    Psychiatric: He has a normal mood and affect. His behavior is normal. Judgment and thought content normal.            Vital Signs: BP 104/64 (BP Location: Right Arm)   Pulse 80   Temp 97.6 F (36.4 C) (Oral)   Resp 16   Ht 5' 3" (1.6 m)   Wt 57 kg (125 lb 10.6 oz)   SpO2 97%   BMI 22.26 kg/m  SpO2: SpO2: 97 % O2 Device: O2 Device: Not Delivered O2 Flow Rate: O2 Flow Rate (L/min): 2 L/min  Intake/output summary:  Intake/Output Summary (Last 24 hours) at 03/09/16 0947 Last data filed at 03/09/16 0500  Gross per 24 hour  Intake              950 ml  Output             2025 ml  Net            -1075 ml   LBM: Last BM Date: 03/05/17 Baseline Weight: Weight: 58.9 kg (129 lb 13.6 oz) Most recent weight: Weight: 57 kg (125 lb 10.6 oz)       Palliative Assessment/Data: PPS 40%     Patient Active Problem List   Diagnosis Date Noted  . Pressure injury of skin 03/07/2016  . DNR (do not resuscitate)   . Goals of care, counseling/discussion   . Palliative care encounter   .   Acute renal failure superimposed on stage 3 chronic kidney disease (HCC) 03/06/2016  . Acute cor pulmonale (HCC)   . Acute on chronic systolic CHF (congestive heart failure) (HCC) 03/05/2016  . Hypertensive heart disease   . Chronic systolic CHF (congestive heart failure) (HCC)   . Bacteremia due to group B Streptococcus   . Ventricular tachycardia (HCC)   . Hyperlipidemia   . Mitral valvular regurgitation   . Coronary artery disease   . Chronic atrial fibrillation (HCC)   . CKD (chronic kidney disease), stage III   . AKI (acute kidney injury) (HCC)   . Dehydration 07/28/2015  . C. difficile colitis 07/28/2015  . Clostridium difficile diarrhea 07/28/2015  . Gram-positive bacteremia 06/07/2015  . Anemia 06/06/2015  . GERD (gastroesophageal reflux disease) 06/06/2015  . Sepsis (HCC) 06/06/2015  . Cellulitis of leg, right 06/06/2015  . NSVT (nonsustained ventricular tachycardia) (HCC) 03/12/2014  .  Cellulitis of right lower extremity   . Sepsis due to cellulitis (HCC) 03/09/2014  . Chronic renal disease, stage III 03/09/2014  . Ischemic dilated cardiomyopathy-EF 25-30% 2013 10/04/2013  . Encounter for therapeutic drug monitoring 07/10/2013  . S/P CABG x 3 2002 12/26/2012  . Mitral valve disorder 12/13/2012  . Heart valve replaced by other means 12/13/2012  . Carcinoma of colon (HCC) 02/08/2012  . Benign neoplasm of colon 02/08/2012  . Gastritis 02/08/2012  . Colonic mass 02/08/2012  . Iron deficiency anemia 02/05/2012  . HTN (hypertension) 02/03/2012  . S/P MVR (mitral valve replacement)-St Jude 2002 02/03/2012  . GIB (gastrointestinal bleeding) 02/03/2012    Palliative Care Assessment & Plan   HPI: 83 y.o. male  with past medical history of severe MR s/p MVR, HTN, atrial fibrillation, CAD s/p CABG, ICM, systolic CHF (EF 15%), h/o VT admitted on 03/05/2016 with increased leg edema and weight, decreased appetite, weakness. Found to have progressive systolic CHF with hypotension. Now with severe pulmonary hypertension as well. After extensive discussion with Alfred Dennis and his family (primarily his daughter, Karen, who is his primary caregiver), the plan is to optimize him medically, then discharge home with Hospice.  Assessment: Alfred Dennis appears comfortable in bed. I reiterated the plan to optimize his status and then have him return home with Hospice. He was comfortable with this plan. I encouraged him to verbalize his needs both here and at home, as the goals of care are focused on maintaining his comfort.  I was able to speak with Karen (his daughter and primary caregiver) this morning and confirmed she remained interested in pursuing home hospice at discharge. I placed the order for care management to facilitate Hospice referral, and to ensure a Hospice representative has met with Alfred Dennis and his family to explain services prior to discharge. The care manager was  working with Karen when I arrived at the patient's bedside.   Recommendations/Plan:  DNR/focus on quality of life and comfort  Discharge home with Hospice once medically optimized, which is expected 03/10/16  Order placed for care management to facilitate Hospice referral; [place] is working on this  I did change pt to a regular diet. I discussed the increased salt effect with him and his daughter. They acknowledge the side effects from increased sodium and feel the benefit of a liberalized diet, in the context of end of life, is greater than the harm.   Goals of Care and Additional Recommendations:  Limitations on Scope of Treatment: comfort care  Code Status:  DNR  Prognosis:   Weeks to   months; however it could be sooner with his worsening CHF  Discharge Planning:  Home with Hospice  Care plan was discussed with pt, pt's wife and daughter, and care manager  Thank you for allowing the Palliative Medicine Team to assist in the care of this patient.   Time In: 0815 0945 Time Out: 0845 1000 Total Time 45 minutes Prolonged Time Billed  yes       Greater than 50%  of this time was spent counseling and coordinating care related to the above assessment and plan.  Charlynn Court, NP Palliative Medicine Team [phone] pager (7a-5p) Team Phone # [phone]

## 2016-03-09 NOTE — Care Management Note (Signed)
Case Management Note  Patient Details  Name: Alfred Dennis MRN: [id number] Date of Birth: 1923-06-22  Subjective/Objective:   Pt lives with spouse and daughter, will discharge home with hospice.  Provided list of agencies to daughter, referral made per choice.                        Expected Discharge Plan:  [name]  Discharge planning Services  CM Consult   Choice offered to:  Adult Children  HH Arranged:  RN, Social Work [name] Agency:  [name] of Service:  Completed, signed off  Girard Cooter, [place] 03/09/2016, 2:42 PM

## 2016-03-10 LAB — BASIC METABOLIC PANEL
ANION GAP: 6 (ref 5–15)
BUN: 34 mg/dL — ABNORMAL HIGH (ref 6–20)
CO2: 31 mmol/L (ref 22–32)
Calcium: 8.5 mg/dL — ABNORMAL LOW (ref 8.9–10.3)
Chloride: 100 mmol/L — ABNORMAL LOW (ref 101–111)
Creatinine, Ser: 1.4 mg/dL — ABNORMAL HIGH (ref 0.61–1.24)
GFR calc Af Amer: 49 mL/min — ABNORMAL LOW (ref >60)
GFR calc non Af Amer: 43 mL/min — ABNORMAL LOW (ref >60)
Glucose, Bld: 95 mg/dL (ref 65–99)
POTASSIUM: 3.6 mmol/L (ref 3.5–5.1)
Sodium: 137 mmol/L (ref 135–145)

## 2016-03-10 LAB — PROTIME-INR
INR: 1.9
Prothrombin Time: 22.1 seconds — ABNORMAL HIGH (ref 11.4–15.2)

## 2016-03-10 MED ORDER — ENOXAPARIN SODIUM 60 MG/0.6ML ~~LOC~~ SOLN: 60.0000 mg | SUBCUTANEOUS | 9 refills | Status: DC

## 2016-03-10 MED ORDER — FUROSEMIDE 40 MG PO TABS: 80.0000 mg | ORAL_TABLET | ORAL | 5 refills | Status: AC

## 2016-03-10 MED ORDER — POTASSIUM CHLORIDE CRYS ER 20 MEQ PO TBCR
20.0000 meq | EXTENDED_RELEASE_TABLET | Freq: Once | ORAL | Status: AC
  Administered 2016-03-10: 20 meq via ORAL
  Filled 2016-03-10: qty 1

## 2016-03-10 MED ORDER — WARFARIN SODIUM 2.5 MG PO TABS: 1.2500 mg | ORAL_TABLET | ORAL | Status: DC

## 2016-03-10 MED ORDER — MORPHINE SULFATE (CONCENTRATE) 10 MG /0.5 ML PO SOLN: 2.5000 mg | ORAL | 0 refills | Status: AC | PRN

## 2016-03-10 MED ORDER — FUROSEMIDE 80 MG PO TABS: 80.0000 mg | ORAL_TABLET | Freq: Two times a day (BID) | ORAL | Status: DC

## 2016-03-10 MED ORDER — WARFARIN SODIUM 2.5 MG PO TABS: 2.5000 mg | ORAL_TABLET | Freq: Once | ORAL | Status: DC

## 2016-03-10 NOTE — Progress Notes (Signed)
Will be once home and signs up

## 2016-03-10 NOTE — Progress Notes (Signed)
[facility] Liaison visit:  Notified by Edwin Dada, of patient/family request for Hospice and [place] services at home after discharge.  Chart and patient information reviewed with Dr. Alferd Patee, [place] Director) and hospice eligibility confirmed.  Writer spoke with [name], daughter of patient, at bedside to initiate education related to hospice philosophy, services and team approach to care.  Family voiced understanding of information provided.    Please send signed completed DNR form home with patient/family.  Patient will need prescriptions for discharge comfort medications as per Palliative Medicine Team note on 03/09/2016.  DME needs discussed with [name], daughter.  She did not want me to order any DME at this time.  She wants to discuss with AV RN upon visit this evening.   Daughter unsure of where the best place to put equipment is and is unsure of what she will actually need in the home.   [facility] aware of above.  Completed d/c summary will need to be faxed to [facility] at [phone] when final.  Please notify [facility] when patient is ready to leave unit at discharge -- call [phone] or [phone] after 5pm. [facility] information and contact numbers have been given to [name], daughter, during visit.  Above information shared with Jackelyn Poling, [facility].  Please call with any questions.   Thank you,   Edyth Gunnels, RN, [facility] Liaison [phone]  All [place] are now on [place].

## 2016-03-10 NOTE — Progress Notes (Signed)
Contacted hospice services per physician and the Representative was going to look into the referral. Spoke with Debra the CM and she is also checking into the details. RN then received a phone call from [facility] who states they did not receive a referral but will start to work on it right away. Will continue to follow up.

## 2016-03-10 NOTE — Discharge Summary (Signed)
Discharge Summary    Patient ID: CLENT LAFON,  MRN: [id number], DOB/AGE: Nov 25, 1923 83 y.o.  Admit date: 03/05/2016 Discharge date: 03/10/2016  Primary Care Provider: Osborne Casco Primary Cardiologist: Dr. Radford Pax  Discharge Diagnoses    Active Problems:   Coronary artery disease   Chronic atrial fibrillation (HCC)   Acute on chronic systolic CHF (congestive heart failure) (HCC)   Acute cor pulmonale (HCC)   Acute renal failure superimposed on stage 3 chronic kidney disease (HCC)   Pressure injury of skin   DNR (do not resuscitate)   Goals of care, counseling/discussion   Palliative care encounter   History of Present Illness    Alfred Dennis is a 83 y.o. male with past medical history of severe MR (s/p MVR in 2002), HTN, dyslipidemia, chronic atrial fibrillation, CAD (s/p CABG) and ischemic dilated CM (EF 20-25% by echo in 05/2015) and chronic systolic CHF who was directly admitted from the office to [facility] on 12/28/2017for acute on chronic CHF exacerbation.   Was evaluated and noted to be volume overloaded with family reporting a loss of appetite and failure to thrive. BP was at 74/60 at the time of exam. It was thought his heart failure was of a low output state and he was admitted for IV diuresis and possible inotropic support.   Hospital Course     Consultants: Palliative Medicine   His heart failure was thought to be at end-stage and given his advanced age and comorbidities, a Palliative Care consult was recommended. He was continued on IV diuresis but was unable to tolerate BB, ACE-I, or ARB secondary to hypotension and CKD.   Palliative care discussed goals and end-of-life care with the patient and his family. They desired for him to return home with Hospice.   He was diuresed as BP allowed and negative -6.7L throughout admission. Creatinine was elevated to 2.17 on admission, improving to 1.40 at discharge. Weight at time of discharge  was 128 lbs. He was discharged on PO Lasix 80mg  BID with appropriate K+ supplementation.   Pharmacy was consulted in regards to his Coumadin dosing. He was subtherapeutic with an INR of 1.90, therefore he was prescribed Lovenox 60mg  daily for at least 5 days and Coumadin for 2.5mg  on day of discharge then to resume home dosing.   He was last examined by Dr. Radford Pax and deemed stable stable for discharge. An INR appointment and hospital follow-up have been arranged. He was discharged home with Hospice.  _____________  Discharge Vitals Blood pressure (!) 101/54, pulse 79, temperature 97.7 F (36.5 C), temperature source Oral, resp. rate 12, height 5\' 3"  (1.6 m), weight 128 lb 4.9 oz (58.2 kg), SpO2 96 %.  Filed Weights   03/08/16 0500 03/09/16 0500 03/10/16 0429  Weight: 123 lb 14.4 oz (56.2 kg) 125 lb 10.6 oz (57 kg) 128 lb 4.9 oz (58.2 kg)    Labs & Radiologic Studies     CBC No results for input(s): WBC, NEUTROABS, HGB, HCT, MCV, PLT in the last 72 hours. Basic Metabolic Panel  Recent Labs  03/09/16 0222 03/10/16 0301  NA 136 137  K 3.9 3.6  CL 100* 100*  CO2 29 31  GLUCOSE 104* 95  BUN 39* 34*  CREATININE 1.45* 1.40*  CALCIUM 8.7* 8.5*    Diagnostic Studies/Procedures    None Performed.    Disposition   Pt is being discharged home today with Home Hospice.  Follow-up Plans & Appointments  Follow-up Information    Lyda Jester, PA-C Follow up.   Specialties:  Cardiology, Radiology Why:  Cardiology Hospital Follow-Up on 03/25/2015 at 1:30PM.  Contact information: [address] [place] [address] [phone]        [place] Office Follow up on 03/13/2016.   Specialty:  Cardiology Why:  INR Check on 03/14/2015 at 12:15PM.  Contact information: [address] [place] [phone]           Discharge Medications     Medication List    STOP taking these medications     metolazone 2.5 MG tablet Commonly known as:  ZAROXOLYN   ONE-A-DAY MENS 50+ ADVANTAGE PO     TAKE these medications   enoxaparin 60 MG/0.6ML injection Commonly known as:  LOVENOX Inject 0.6 mLs (60 mg total) into the skin daily. Start taking on:  03/11/2016   furosemide 40 MG tablet Commonly known as:  LASIX Take 2 tablets (80 mg total) by mouth See admin instructions. Take 40 mg by mouth in the morning and take 20 mg by mouth 6 hours later. What changed:  how much to take   morphine CONCENTRATE 10 mg / 0.5 ml concentrated solution Take 0.13 mLs (2.6 mg total) by mouth every 4 (four) hours as needed for severe pain.   multivitamin with minerals Tabs tablet Take 1 tablet by mouth daily.   mupirocin ointment 2 % Commonly known as:  BACTROBAN APPLY TO AFFECTED AREA DAILY DURING DRESSING CHANGE   potassium chloride 10 MEQ tablet Commonly known as:  K-DUR Take 1 tablet (10 mEq total) by mouth 3 (three) times daily.   simvastatin 10 MG tablet Commonly known as:  ZOCOR Take 10 mg by mouth at bedtime.   warfarin 2.5 MG tablet Commonly known as:  COUMADIN Take 0.5-1 tablets (1.25-2.5 mg total) by mouth See admin instructions. Take 1.25 mg by mouth on Monday, Wednesday and Friday. Take 2.5 mg by mouth on all other days. Takes with dinner. What changed:  how much to take  how to take this  when to take this  additional instructions          Allergies Allergies  Allergen Reactions  . Tape Other (See Comments)    CERTAIN BANDAGES CAN TEAR THE SKIN; PLEASE USE PAPER TAPE     Outstanding Labs/Studies   INR Appointment this week. BMET at follow-up appointment.   Duration of Discharge Encounter   Greater than 30 minutes including physician time.  Signed, Erma Heritage, PA-C 03/10/2016, 2:05 PM

## 2016-03-10 NOTE — Progress Notes (Signed)
Patient Name: Alfred Dennis Date of Encounter: 03/10/2016  Primary Cardiologist: Fransico Him, MD  Hospital Problem List     Active Problems:   Coronary artery disease   Chronic atrial fibrillation (HCC)   Acute on chronic systolic CHF (congestive heart failure) (HCC)   Acute cor pulmonale (HCC)   Acute renal failure superimposed on stage 3 chronic kidney disease ([place])   Pressure injury of skin   DNR (do not resuscitate)   Goals of care, counseling/discussion   Palliative care encounter     Subjective   Feels better.  LE edema improved. No SOB sitting up. No CP.   Out 0.3L yesterday and net neg 5.5L since admit  Inpatient Medications    Scheduled Meds: . enoxaparin (LOVENOX) injection  60 mg Subcutaneous Q24H  . furosemide  40 mg Intravenous BID  . multivitamin with minerals  1 tablet Oral Daily  . potassium chloride  10 mEq Oral BID  . saccharomyces boulardii  250 mg Oral BID  . simvastatin  10 mg Oral QHS  . sodium chloride flush  3 mL Intravenous Q12H  . Warfarin - Pharmacist Dosing Inpatient   Does not apply q1800   Continuous Infusions:  PRN Meds: sodium chloride, acetaminophen, ondansetron (ZOFRAN) IV, polyvinyl alcohol, sodium chloride flush   Vital Signs    Vitals:   03/10/16 0429 03/10/16 0700 03/10/16 0800 03/10/16 0900  BP: (!) 100/45 (!) 82/50 (!) 82/50   Pulse: 72 79    Resp: 16 12 15 15   Temp: 97.4 F (36.3 C) 97.6 F (36.4 C)    TempSrc: Oral Oral    SpO2: 96%     Weight: 128 lb 4.9 oz (58.2 kg)     Height:        Intake/Output Summary (Last 24 hours) at 03/10/16 0959 Last data filed at 03/10/16 0900  Gross per 24 hour  Intake              480 ml  Output              700 ml  Net             -220 ml   Filed Weights   03/08/16 0500 03/09/16 0500 03/10/16 0429  Weight: 123 lb 14.4 oz (56.2 kg) 125 lb 10.6 oz (57 kg) 128 lb 4.9 oz (58.2 kg)    Physical Exam    GEN: Thin, frail, in no acute distress.  HEENT: Grossly normal.    Neck: Supple, no JVD, carotid bruits, or masses. Cardiac: irregularly irregular, no murmurs, rubs, or gallops. No clubbing, cyanosis.  1+ edema Respiratory:  Respirations regular and unlabored, clear to auscultation bilaterally. GI: Soft, nontender, nondistended, BS + x 4. MS: no deformity or atrophy. Skin: warm and dry, no rash. Neuro:  Strength and sensation are intact. Psych: AAOx3.  Normal affect.  Labs    CBC No results for input(s): WBC, NEUTROABS, HGB, HCT, MCV, PLT in the last 72 hours. Basic Metabolic Panel  Recent Labs  03/09/16 0222 03/10/16 0301  NA 136 137  K 3.9 3.6  CL 100* 100*  CO2 29 31  GLUCOSE 104* 95  BUN 39* 34*  CREATININE 1.45* 1.40*  CALCIUM 8.7* 8.5*   Liver Function Tests No results for input(s): AST, ALT, ALKPHOS, BILITOT, PROT, ALBUMIN in the last 72 hours.   Telemetry    Atrial fibrillation with bursts of tachy- Personally Reviewed  ECG    Atrial fibrillation - Personally Reviewed  Radiology    No results found.  Cardiac Studies   ECHO 06/06/15 - Compared to the prior study from 03/12/2014 LVEF is now decreased   estimated at 15-20%, also moderately decreased RVEF.   There is now severe pulmonary hypertension.   Mechanical mitral valve has normal function.  Patient Profile      83 y.o. male with a history of severe MR s/p MVR, HTN, dyslipidemia, chronic atrial fibrillation, CAD s/p CABG and ischemic dilated CM with EF 35-30% at time of his colon surgery and chronic systolic CHF. He has a history of VT but due to his age and comorbidities he was not felt to be a candidate for ICD. Last echo 05/2015 showed LV EF of 15-20%, also moderately decreased RVEF. There is now severe pulmonary hypertension. Mechanical mitral valve has normal function. Last seen by Truitt Merle 02/18/16. He has had significant diuresis with the Zaroxolyn but with more fatigue.  Seen with daughter and wife yesterday.  He had gained 5lb in one week. Decreased  appetite. Early satiety. + orthopnea, PND and LE edema. BP in office 74/60 manually and EKG showed afib at rate of 91 bpm.   Admitted for further evaluation and treatment.   Assessment & Plan    1. Acute on chronic systolic CHF - EF of 0000000. Difficult situation given low BP. I think he has reached the endstages of life and not much more to offer given his advanced age and comorbidities. He has severe right and left sided heart failure with failure to thrive, reduced appetite likely due to bowel wall edema from severe pulmonary HTN and right sided HF.  Poor CO resulting in hypotension limits use of HF meds.    LE edema much improved.  Plan to discharge home with hospice with palliative recs.  Discussed with Palliative care yesterday and should have everything in place to go home today. Change to PO diuretics. Cannot tolerate BB or ACE I/ARB due to soft BP and CKD.  2. Permanent atrial fibrillation - rate controlled. - On coumadin, subtherapeutic today - pharmacy dosing  3. ICM - Not on optimal medical therapy due to low BP. Overall prognosis very poor.  4. CAD s/p CABG - No angina.   5. Hx of MVR - On coumadin per pharmacy .  6. Hypotension - BP soft in upper 90's to low 100's.  7.  Acute on CKD stage 3 - creatinine improved from yesterday with diuresis.   8.  Ventricular tachycardia - will give potassium replacement today.  Patient is DNR/Hospice.   Signed, Fransico Him, MD  03/10/2016, 9:59 AM

## 2016-03-10 NOTE — Consult Note (Addendum)
[facility] Nurse wound consult note Reason for Consult: Consult requested for bilat feet.  Daughter at the bedside to assess wounds and discuss plan of care.  She is well informed on topical treatment and states pt is followed by the [facility] as an outpatient.  She soaks bilat feet in Epsom salts and applies antibiotic ointment Q day.   Wound type: Left plantar foot with chronic raised callous; 2X2cm.  Full thickness wound in center .1X.1X.1cm, dry cracked full thickness lesion.  No odor, drainage, or fluctuance.  Right plantar foot with chronic full thickness wound; .2X.2X.2cm, dry dark red wound bed, no odor, drainage, or fluctuance.  Dressing procedure/placement/frequency: Pt plans to discharge home today with hospice, according to the bedside nurse. Foam dressings applied to protect from further injury and pt can continue present plan of care as outlined above after discharge. Discussed plan of care with daughter and she denies further questions. Please re-consult if further assistance is needed.  Thank-you,  Julien Girt MSN, [place], [place], [place], [place]

## 2016-03-10 NOTE — Progress Notes (Signed)
Dc home w hospice and paliative care to pick up once home

## 2016-03-10 NOTE — Progress Notes (Addendum)
Patient being discharged home with hospice. Instructions given to daughter as well as prescription. Transport arrived and report given. IV catheter removed and dressed, no bleeding. Condom catheter removed.

## 2016-03-10 NOTE — Progress Notes (Addendum)
[place] for Lovenox>>Coumadin Indication: mechanical heart valve, Afib  Allergies  Allergen Reactions  . Tape Other (See Comments)    CERTAIN BANDAGES CAN TEAR THE SKIN; PLEASE USE PAPER TAPE    Patient Measurements: Height: 5\' 3"  (160 cm) Weight: 128 lb 4.9 oz (58.2 kg) IBW/kg (Calculated) : 56.9  Vital Signs: Temp: 97.7 F (36.5 C) (01/02 1100) Temp Source: Oral (01/02 1100) BP: 106/67 (01/02 1100) Pulse Rate: 79 (01/02 0700)   Recent Labs  03/08/16 0303 03/09/16 0222 03/10/16 0301  LABPROT 27.1* 22.2* 22.1*  INR 2.45 1.91 1.90  CREATININE 1.57* 1.45* 1.40*    Medical History: Past Medical History:  Diagnosis Date  . Arthritis    "left foot; fingers" (02/03/2012)  . Bacteremia due to group B Streptococcus    a. 05/2015-->outpt abx x 6 wks complicated by C diff.  . Basal cell carcinoma of shoulder 10/2010  . C. difficile colitis    a. Admit 5/1 - 08/01/2015 - occurred in setting of outpt Abx for Group B Strep Bacteremia.  . Carcinoma of colon ([place])   . Chronic anticoagulation    a. coumadin.  . Chronic atrial fibrillation (HCC)    a. CHA2DS2VASc = 7-->chronic coumadin.  . Chronic systolic CHF (congestive heart failure) ([place])    a. 05/2015 Echo: EF 15-20%, diff HK.  . CKD (chronic kidney disease), stage III   . Coronary artery disease    a. 05/2000 s/p CABG x 4 (LIMA to LAD, SVG to RI, SVG to OM).  Mar[place] Kitchen GIB (gastrointestinal bleeding)   . Hyperlipidemia   . Hypertension   . Hypertensive heart disease   . Ischemic dilated cardiomyopathy ([place])    a. 01/2012 Echo: EF 25-30%;  b. 05/2015 Echo: EF 15-20%, mild MR, sev dil LA, mod dil RV w/ mod reduced RV fxn, mod dil RA, sev TR, mild PR, PASP 46mmHg.  . Mitral valvular regurgitation    a. 05/2000 s/p SJM 33 mm mechanical MV replacement;  b. 05/2015 Echo: mild MR.  . Non-sustained ventricular tachycardia ([place])   . Prediabetes   . Stroke [facility])    a. 05/2000 after CABG  .  Ventricular tachycardia ([place])    a.not felt to be AICD candidate 2/2 advanced age/co-morbidities.   Assessment: 83 yo male presenting with LE edema.  Pt with mechanical MVR on warfarin prior to admission. INR had been supratherapeutic for most of admission, warfarin doses were held on 12/29, 12/30, warfarin was resumed yesterday; however, now the INR is subtherapeutic at 1.9. Will continue bridge with Lovenox as pt is close to being discharged.     PTA warfarin dose: 1.25 mg Mon/Wed/Fri, 2.5 mg all other days   Goal of Therapy:  INR 2.5-3.5 Monitor platelets by anticoagulation protocol: Yes   Plan:  Would recommend warfarin 2.5mg  tonight and then resume home dose tomorrow night Lovenox 60 mg [place] q24h>>expect patient to need at least 5 days of additional coverage to ensure he is therapeutic. Stop Lovenox when INR > 2.5>>would recommend INR check on Friday if possible  Erin Hearing PharmD., BCPS Clinical Pharmacist Pager [phone] 03/10/2016 12:25 PM

## 2016-03-12 ENCOUNTER — Telehealth: Payer: Self-pay | Admitting: Cardiology

## 2016-03-12 NOTE — Telephone Encounter (Signed)
New Message    Pt is now in hospice and they would like to do INR in house with Hospice, but they need an order faxed to their office with approval for them to do this.    Hospice Office Number [phone] Fax [phone] Efrain Sella

## 2016-03-12 NOTE — Telephone Encounter (Signed)
Called Wendy with Hospice and gave verbal order under Dr Radford Pax for them to start checking INR. They will check an INR tomorrow and call clinic with results for dosing instructions. Verified Coumadin dosing, INR range, and gave our telephone number. No further questions at this time.

## 2016-03-13 ENCOUNTER — Ambulatory Visit: Payer: Medicare Other | Admitting: Nurse Practitioner

## 2016-03-13 ENCOUNTER — Telehealth: Payer: Self-pay | Admitting: Student

## 2016-03-13 NOTE — Telephone Encounter (Signed)
   Hospice called to report the patient's INR is 2.4 today. Discharged from [facility] on 1/2 with Coumadin and Lovenox injections until INR therapeutic at 2.5-3.5. Advised to recheck INR on 03/15/2016. If therapeutic at that time, can discontinue Lovenox injections and continue on Coumadin. The nurse voiced understanding of this and was appreciative of the call.   Signed, Erma Heritage, PA-C 03/13/2016, 7:05 PM Pager: [phone]

## 2016-03-15 ENCOUNTER — Telehealth: Payer: Self-pay | Admitting: Cardiology

## 2016-03-15 NOTE — Telephone Encounter (Signed)
Called by Hospice RN pt's INR 2.0, down from 2.5. He has been on Lovenox 60 mg daily. He has a history of MVR. His current Coumadin dose is 1.25 mg MWF- 2.5 mg other days. I suggested they renew his Lovenox 60 mg daily and increase his coumadin to 2.5 mg daily- INR this Wed.  Kerin Ransom PA-C 03/15/2016 10:37 AM

## 2016-03-18 ENCOUNTER — Ambulatory Visit (INDEPENDENT_AMBULATORY_CARE_PROVIDER_SITE_OTHER): Payer: Medicare Other | Admitting: Pharmacist

## 2016-03-18 DIAGNOSIS — Z5181 Encounter for therapeutic drug level monitoring: Secondary | ICD-10-CM

## 2016-03-18 DIAGNOSIS — I059 Rheumatic mitral valve disease, unspecified: Secondary | ICD-10-CM

## 2016-03-18 LAB — POCT INR: INR: 3.4

## 2016-03-20 ENCOUNTER — Telehealth: Payer: Self-pay | Admitting: *Deleted

## 2016-03-20 NOTE — Telephone Encounter (Signed)
Faxing to hospice supplemental orders for a coumadin draw @ [phone].

## 2016-03-24 ENCOUNTER — Ambulatory Visit (INDEPENDENT_AMBULATORY_CARE_PROVIDER_SITE_OTHER): Payer: Medicare Other | Admitting: Cardiology

## 2016-03-24 ENCOUNTER — Encounter: Payer: Self-pay | Admitting: Cardiology

## 2016-03-24 ENCOUNTER — Telehealth: Payer: Self-pay

## 2016-03-24 ENCOUNTER — Ambulatory Visit (INDEPENDENT_AMBULATORY_CARE_PROVIDER_SITE_OTHER): Payer: Medicare Other | Admitting: *Deleted

## 2016-03-24 VITALS — BP 100/64 | HR 51 | Ht 67.0 in | Wt 126.4 lb

## 2016-03-24 DIAGNOSIS — I5022 Chronic systolic (congestive) heart failure: Secondary | ICD-10-CM

## 2016-03-24 DIAGNOSIS — Z952 Presence of prosthetic heart valve: Secondary | ICD-10-CM

## 2016-03-24 DIAGNOSIS — I059 Rheumatic mitral valve disease, unspecified: Secondary | ICD-10-CM | POA: Diagnosis not present

## 2016-03-24 DIAGNOSIS — Z5181 Encounter for therapeutic drug level monitoring: Secondary | ICD-10-CM

## 2016-03-24 LAB — POCT INR: INR: 4.4

## 2016-03-24 MED ORDER — WARFARIN SODIUM 2.5 MG PO TABS
2.5000 mg | ORAL_TABLET | ORAL | 3 refills | Status: AC
Start: 2016-03-24 — End: ?

## 2016-03-24 NOTE — Patient Instructions (Addendum)
Medication Instructions:    Your physician recommends that you continue on your current medications as directed. Please refer to the Current Medication list given to you today.   If you need a refill on your cardiac medications before your next appointment, please call your pharmacy.  Labwork: NONE ORDERED  TODAY    Testing/Procedures: NONE ORDERED  TODAY    Follow-Up: WITH DR TURNER IN 8 WEEKS     Any Other Special Instructions Will Be Listed Below (If Applicable).  YO HAVE BEEN GIVEN A SCRIPT FOR TED HOSE TO TAKE TO YOUR LOCAL  MEDICAL SUPPLY STORE..FOR YOU TO  WEAR DURING THE DAY AND TO REMOVE AT NIGHT TIME

## 2016-03-24 NOTE — Telephone Encounter (Signed)
Patient had OV with B. Rosita Fire, [place] today.  At visit, they asked if Dr. Radford Pax would be his Attendingfor Hospice. Per Dr. Radford Pax, informed [name] that his PCP will need to asked about this. She agrees with treatment plan.

## 2016-03-24 NOTE — Progress Notes (Signed)
03/24/2016 KHALIB KERNS   [birth year]  [id number]  Primary Physician Osborne Casco, MD Primary Cardiologist: Dr. Radford Pax    Reason for Visit/CC: [facility] f/u for Acute on Chronic Systolic HF  HPI:  Mr. Herpel, presents to clinic today for post hospital f/u after recent admission to [facility] for acute on chronic systolic HF.  To summarize, he is a  83 y.o. male with past medical history of severe MR (s/p MVR in 2002), HTN, dyslipidemia, chronic atrial fibrillation, CAD (s/p CABG) and ischemic dilated CM (EF 20-25% by echo in 05/2015) and chronic systolic CHF who was directly admitted from the office to [facility] on 12/28/2017for acute on chronic CHF exacerbation.   Was evaluated and noted to be volume overloaded with family reporting a loss of appetite and failure to thrive. BP was at 74/60 at the time of exam. It was thought his heart failure was of a low output state and he was admitted for IV diuresis and possible inotropic support.   His heart failure was thought to be at end-stage and given his advanced age and comorbidities, a Palliative Care consult was recommended. He was continued on IV diuresis but was unable to tolerate BB, ACE-I, or ARB secondary to hypotension and CKD.   Palliative care discussed goals and end-of-life care with the patient and his family. They desired for him to return home with Hospice.   He was diuresed as BP allowed and negative -6.7L throughout admission. Creatinine was elevated to 2.17 on admission, improving to 1.40 at discharge. Weight at time of discharge was 128 lbs. He was discharged on PO Lasix 80mg  BID with appropriate K+ supplementation.   Pharmacy was consulted in regards to his Coumadin dosing. He was subtherapeutic with an INR of 1.90, therefore he was prescribed Lovenox 60mg  daily for at least 5 days and Coumadin for 2.5mg  on day of discharge then to resume home dosing. He was discharged to home Hospice.  Today in  f/u, he is here with his wife and daughter. He has been getting along fairly well. He continues to have mild dyspnea with exertion but denies resting dyspnea. No orthopnea/ PND. He sleeps with only 1 pillow. He notes good urine output. His weight has remained stable between 121-124 since discharge. He denies CP. He has chronic edema and he tries to elevate his legs while at home. His daughter has him on a low sodium diet. He has not required any further increase in his home lasix. Still on 40 mg BID. He does not require supplemental O2.     No outpatient prescriptions have been marked as taking for the 03/24/16 encounter (Appointment) with Consuelo Pandy, PA-C.   Allergies  Allergen Reactions  . Tape Other (See Comments)    CERTAIN BANDAGES CAN TEAR THE SKIN; PLEASE USE PAPER TAPE   Past Medical History:  Diagnosis Date  . Arthritis    "left foot; fingers" (02/03/2012)  . Bacteremia due to group B Streptococcus    a. 05/2015-->outpt abx x 6 wks complicated by C diff.  . Basal cell carcinoma of shoulder 10/2010  . C. difficile colitis    a. Admit 5/1 - 08/01/2015 - occurred in setting of outpt Abx for Group B Strep Bacteremia.  . Carcinoma of colon ([place])   . Chronic anticoagulation    a. coumadin.  . Chronic atrial fibrillation (HCC)    a. CHA2DS2VASc = 7-->chronic coumadin.  . Chronic systolic CHF (congestive heart failure) ([place])  a. 05/2015 Echo: EF 15-20%, diff HK.  . CKD (chronic kidney disease), stage III   . Coronary artery disease    a. 05/2000 s/p CABG x 4 (LIMA to LAD, SVG to RI, SVG to OM).  Marland Kitchen GIB (gastrointestinal bleeding)   . Hyperlipidemia   . Hypertension   . Hypertensive heart disease   . Ischemic dilated cardiomyopathy ([place])    a. 01/2012 Echo: EF 25-30%;  b. 05/2015 Echo: EF 15-20%, mild MR, sev dil LA, mod dil RV w/ mod reduced RV fxn, mod dil RA, sev TR, mild PR, PASP 26mmHg.  . Mitral valvular regurgitation    a. 05/2000 s/p SJM 33 mm mechanical MV replacement;   b. 05/2015 Echo: mild MR.  . Non-sustained ventricular tachycardia ([place])   . Prediabetes   . Stroke [facility])    a. 05/2000 after CABG  . Ventricular tachycardia ([place])    a.not felt to be AICD candidate 2/2 advanced age/co-morbidities.   Family History  Problem Relation Age of Onset  . Heart disease Mother   . Heart disease Sister   . Cancer - Prostate Son    Past Surgical History:  Procedure Laterality Date  . CARDIAC VALVE REPLACEMENT  05/2000   St. Jude 51mm  . CATARACT EXTRACTION W/ INTRAOCULAR LENS  IMPLANT, BILATERAL Bilateral ~ 2011  . COLONOSCOPY  02/08/2012   Procedure: COLONOSCOPY;  Surgeon: Gatha Mayer, MD;  Location: [facility];  Service: Endoscopy;  Laterality: N/A;  . CORONARY ARTERY BYPASS GRAFT     LIMA to LAD, SVG to IM, SVG to OM2  . EMBOLECTOMY Left 03/21/2014   Procedure: LEFT BRACHIAL EMBOLECTOMY;  Surgeon: Elam Dutch, MD;  Location: [place];  Service: Vascular;  Laterality: Left;  . ESOPHAGOGASTRODUODENOSCOPY  02/08/2012   Procedure: ESOPHAGOGASTRODUODENOSCOPY (EGD);  Surgeon: Gatha Mayer, MD;  [facility] ENDOSCOPY;  Service: Endoscopy;  Laterality: N/A;  . INGUINAL HERNIA REPAIR Right   . LAPAROSCOPIC PARTIAL COLECTOMY  02/10/2012   Procedure: LAPAROSCOPIC PARTIAL COLECTOMY;  Surgeon: Stark Klein, MD;  Location: [facility];  Service: General;  Laterality: N/A;  . VALVE REPLACEMENT  05/2000   St. Jude 36mm mechanical valve   Social History   Social History  . Marital status: Married    Spouse name: Pamala Hurry  . Number of children: 2  . Years of education: N/A   Occupational History  . Retired     Cytogeneticist   Social History Main Topics  . Smoking status: Former Smoker    Packs/day: 0.00    Years: 0.00    Types: Cigarettes    Quit date: 03/09/1980  . Smokeless tobacco: Never Used     Comment: 02/03/2012 "quit smoking in my 58's"  . Alcohol use No  . Drug use: No  . Sexual activity: No   Other Topics Concern  . Not on file   Social  History Narrative   Lives with wife, [name] story single family home   Retired Regulatory affairs officer   Two grown children-boy & girl   Gets medications from [facility] (except Coumadin)     Review of Systems: General: negative for chills, fever, night sweats or weight changes.  Cardiovascular: negative for chest pain, dyspnea on exertion, edema, orthopnea, palpitations, paroxysmal nocturnal dyspnea or shortness of breath Dermatological: negative for rash Respiratory: negative for cough or wheezing Urologic: negative for hematuria Abdominal: negative for nausea, vomiting, diarrhea, bright red blood per rectum, melena, or hematemesis Neurologic: negative  for visual changes, syncope, or dizziness All other systems reviewed and are otherwise negative except as noted above.   Physical Exam:  There were no vitals taken for this visit.  General appearance: alert, cooperative and no distress Neck: no carotid bruit and no JVD Lungs: clear to auscultation bilaterally Heart: regular rate and rhythm, S1, S2 normal, no murmur, click, rub or gallop Extremities: 2+ bilateral pittind edema Pulses: 2+ and symmetric Skin: Skin color, texture, turgor normal. No rashes or lesions Neurologic: Grossly normal  EKG not performed.   ASSESSMENT AND PLAN:   1. Chronic systolic CHF: - EF of 0000000. End stage heart failure. He has severe right and left sided heart failure with failure to thrive, reduced appetite likely due to bowel wall edema from severe pulmonary HTN and right sided HF.  Poor CO resulting in hypotension limits use of HF meds. Cannot tolerate BB or ACE I/ARB due to soft BP and CKD. Decision was reached during recent hospitalization to go with palliative care. He now has home hospice. He is DNR. He is comfortable at rest on RA. No supplemental O2 requirements. Lungs are clear but he has 2+ bilateral LEE. Weight has remained between 121-124 lb since discharge. Continue Lasix 40 mg  BID. Pt encouraged to elevate legs to help reduce edema, while sitting at home. We discussed compression stockings. Given palliative approach, focus on control of HF symptoms. Palliative care RN may need to increase Lasix, PRN based on symptoms, w/o concern for worsening renal function.    2. Permanent atrial fibrillation - rate controlled. - On coumadin, subtherapeutic today - pharmacy dosing. INR check is scheduled for today.   3. ICM - Not on optimal medical therapy due to low BP. BB and ACE-I discontinued. Overall prognosis very poor. Palliative Care.  4. CAD s/p CABG - No angina.   5. Hx of MVR - On coumadin per pharmacy .  6. Hypotension - stable in clinic today. 100/64.  BB/ACE-I discontinued.   7.CKD stage 3 - on Lasix 40 mg BID. Given palliative approach, focus on control of HF symptoms. Palliative care RN may need to increase Lasix, PRN based on symptoms, w/o concern for worsening renal function.    PLAN  F/u in 6-8 weeks.   Lyda Jester PA-C 03/24/2016 1:34 PM

## 2016-04-03 ENCOUNTER — Ambulatory Visit (INDEPENDENT_AMBULATORY_CARE_PROVIDER_SITE_OTHER): Payer: Medicare Other | Admitting: Internal Medicine

## 2016-04-03 DIAGNOSIS — I059 Rheumatic mitral valve disease, unspecified: Secondary | ICD-10-CM

## 2016-04-03 DIAGNOSIS — Z5181 Encounter for therapeutic drug level monitoring: Secondary | ICD-10-CM

## 2016-04-03 LAB — POCT INR: INR: 3.6

## 2016-04-09 ENCOUNTER — Telehealth: Payer: Self-pay | Admitting: Cardiology

## 2016-04-09 NOTE — Telephone Encounter (Signed)
Left message with answering service to call back.

## 2016-04-09 NOTE — Telephone Encounter (Signed)
New message      Pt c/o BP issue: STAT if pt c/o blurred vision, one-sided weakness or slurred speech  1. What are your last 5 BP readings? 104/58 yesterday, 100/60 today 2. Are you having any other symptoms (ex. Dizziness, headache, blurred vision, passed out)?  dizziness 3. What is your BP issue? Pt fell over the weekend due to dizziness.  He takes lasix--40mg  bid.  Calling to see if he can hold his pm lasix today.

## 2016-04-10 ENCOUNTER — Telehealth: Payer: Self-pay | Admitting: Cardiology

## 2016-04-10 NOTE — Telephone Encounter (Signed)
New Message    Pt is having problem with adema but his bp 100/60 is bottoming out, the caregiver thinks he is taking too much lasix   Patient fell 04/05/16 and he is having problem with dizziness (no major injuries)   Cell phone: [phone]

## 2016-04-10 NOTE — Telephone Encounter (Signed)
Hospice RN notified that I spoke with the daughter this morning regarding this issue and the recommendation per Lyda Jester, PA was to hold the lasix while the BP is too low and she can give it if the patient becomes extremely SOB or if his BP improves. The Hospice RN was also advised to coordinate with the Attending for Hospice (patient's PCP) for managing the patient's care.

## 2016-04-10 NOTE — Telephone Encounter (Signed)
New Message     Thinks the pt bp is bottoming out , pt fell Sunday 04/05/16 because he was dizzy, his bp was 100/60  Family thinks he is taking to many lasix, he is suffering from adema         After 5p [phone]

## 2016-04-10 NOTE — Telephone Encounter (Signed)
Daughter called and said that the patient's BP has been low (104/58, 100/60). She states that her and the Hospice RN held the patient's PM lasix yesterday. She states that the patient has been dizzy and has had no appetite. She states that he fell a few days ago. She states that his weight has gone from 123.8 to 127.2 and his legs are swollen and weeping. She states that she gave her father his morning dose of lasix (40 mg) this morning and now his BP is 89/59. She states that she feels like her father is drowning in fluid and needs the lasix but his BP is too low. She is asking for advise on whether to give the lasix. Spoke with Lyda Jester, PA and she advised to hold the lasix while the BP is too low and she can give it if the patient becomes extremely SOB or if his BP improves. The daughter was also advised to coordinate with the Hospice RN and the Attending for Hospice (patient's PCP) for managing the patient's care. The daughter verbalized understanding.

## 2016-04-10 NOTE — Telephone Encounter (Signed)
F/u Message  Pt c/o BP issue: STAT if pt c/o blurred vision, one-sided weakness or slurred speech  1. What are your last 5 BP readings? 89/59/ pulse 78  2. Are you having any other symptoms (ex. Dizziness, headache, blurred vision, passed out)? no  3. What is your BP issue? Pt daughter call requesting to speak with RN she states pt weight went from 123.8 to 127.2. Please call back to disucuss

## 2016-04-15 ENCOUNTER — Ambulatory Visit (INDEPENDENT_AMBULATORY_CARE_PROVIDER_SITE_OTHER): Admitting: Internal Medicine

## 2016-04-15 DIAGNOSIS — Z5181 Encounter for therapeutic drug level monitoring: Secondary | ICD-10-CM

## 2016-04-15 DIAGNOSIS — I059 Rheumatic mitral valve disease, unspecified: Secondary | ICD-10-CM

## 2016-04-15 LAB — POCT INR: INR: 5.1

## 2016-04-24 ENCOUNTER — Ambulatory Visit: Payer: Self-pay | Admitting: Cardiology

## 2016-04-24 DIAGNOSIS — I059 Rheumatic mitral valve disease, unspecified: Secondary | ICD-10-CM

## 2016-04-24 DIAGNOSIS — Z5181 Encounter for therapeutic drug level monitoring: Secondary | ICD-10-CM

## 2016-05-07 DEATH — deceased

## 2016-05-19 ENCOUNTER — Ambulatory Visit: Payer: Medicare Other | Admitting: Cardiology

## 2016-05-21 IMAGING — CR DG CHEST 1V PORT
1 series · 1 of 1 positions shown · non-contrast
Comparison: Chest radiograph performed 02/12/2012

CLINICAL DATA: Acute onset of generalized weakness for 1 day.
Initial encounter.

EXAM:
PORTABLE CHEST - 1 VIEW

[portable]
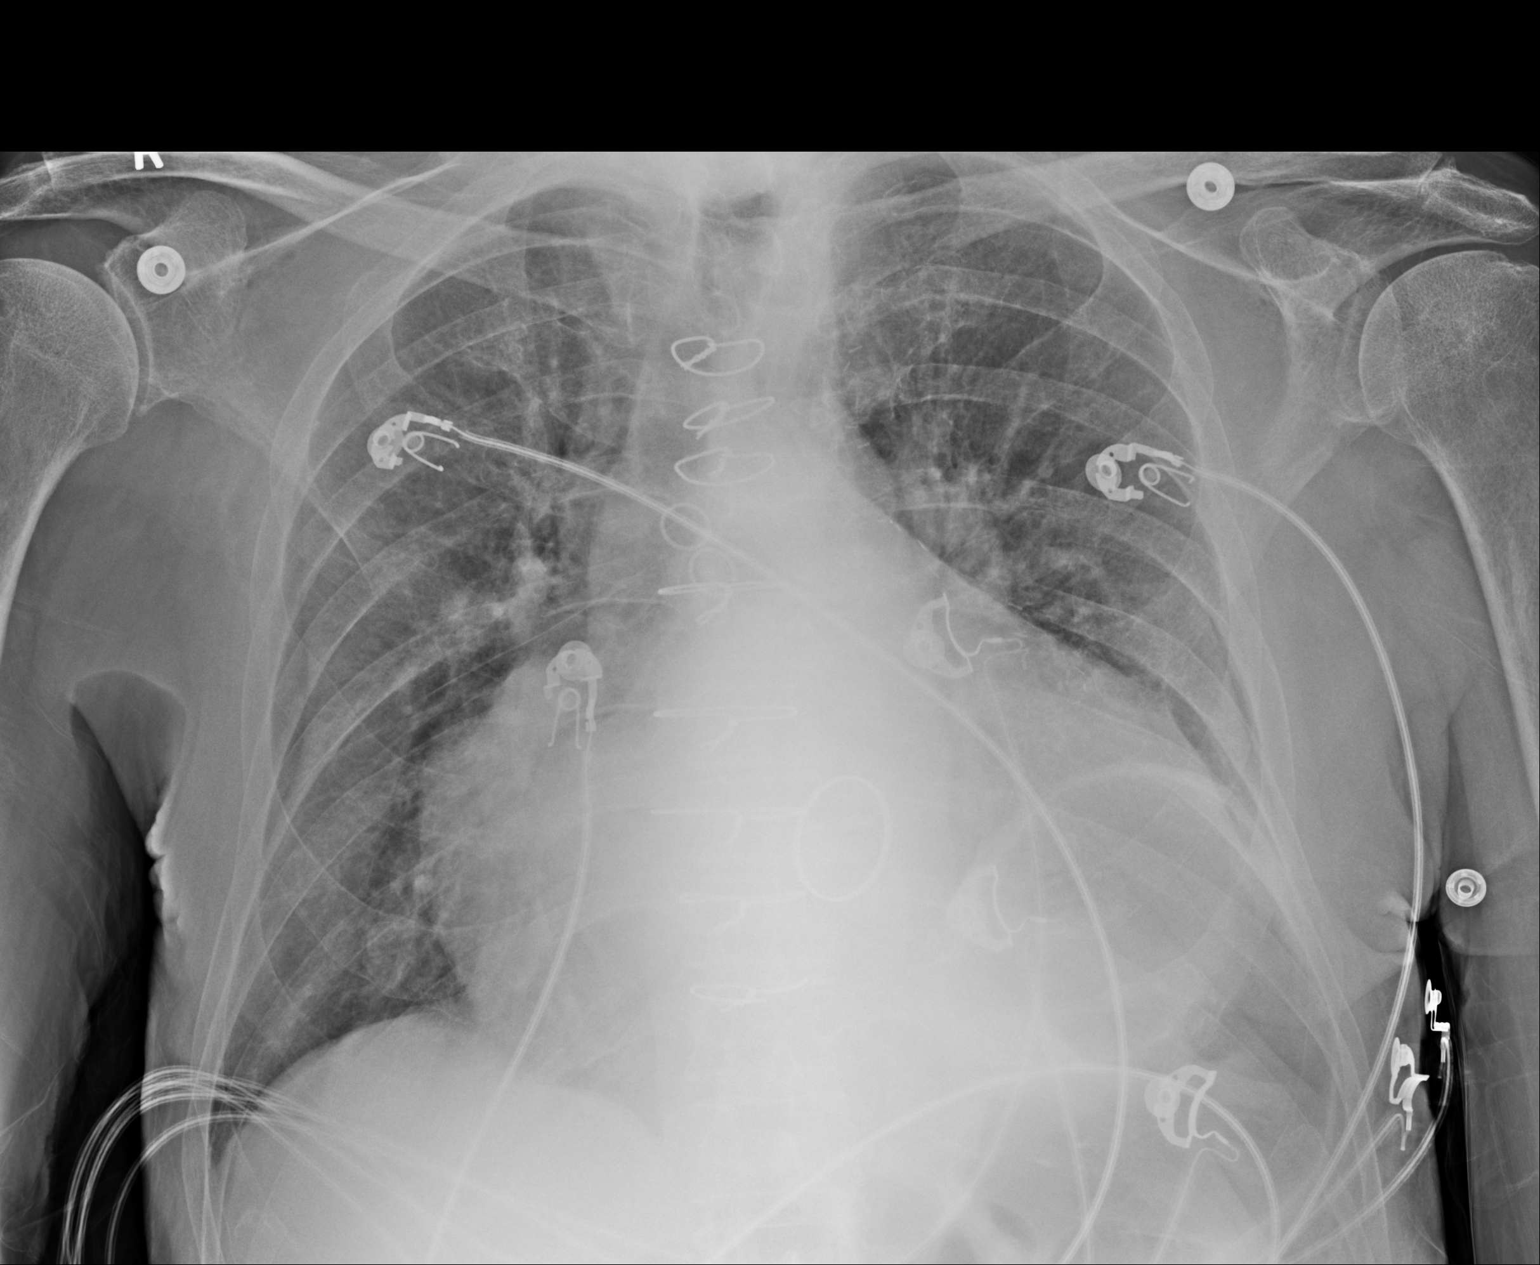

[1 of 1 positions shown; findings below may reference images not displayed]

FINDINGS: Vascular congestion is noted. Minimal bilateral opacities could
reflect minimal interstitial edema or possibly a mild infectious
process. There is elevation of the left hemidiaphragm. No pleural
effusion or pneumothorax is seen.

The cardiomediastinal silhouette remains significantly enlarged. The
patient is status post median sternotomy, with evidence of prior
CABG. A valve replacement is noted. No acute osseous abnormalities
are identified.
IMPRESSION: Vascular congestion and cardiomegaly noted. Minimal bilateral
opacities could reflect minimal interstitial edema or possibly a
mild infectious process, depending on the patient's symptoms. Stable
chronic elevation of the left hemidiaphragm.

## 2016-05-22 IMAGING — CT CT HEAD W/O CM
1 series · 16 of 30 positions shown, 20 images · non-contrast
Comparison: None.

CLINICAL DATA: Patient lost balance wall walking in slipped down to
the floor. Chronic anticoagulation.

EXAM:
CT HEAD WITHOUT CONTRAST
TECHNIQUE: Contiguous axial images were obtained from the base of the skull
through the vertex without intravenous contrast.

[Series 2: head 5.0 h30s · axial · 0.44mm/px · z∈[-141,-1]mm · 16 of 32 slices shown, 20 images]
[im 2/32  brain]
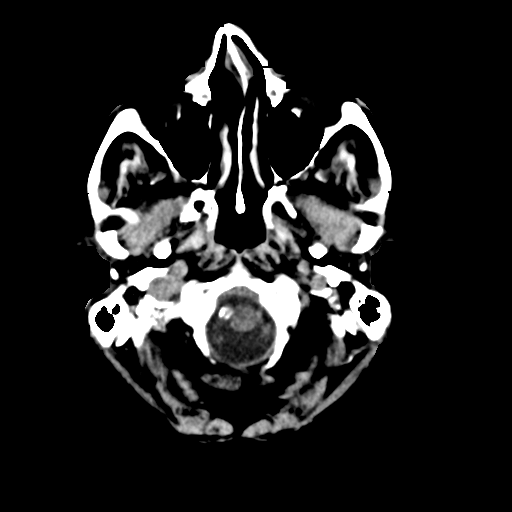
[im 2/32  bone]
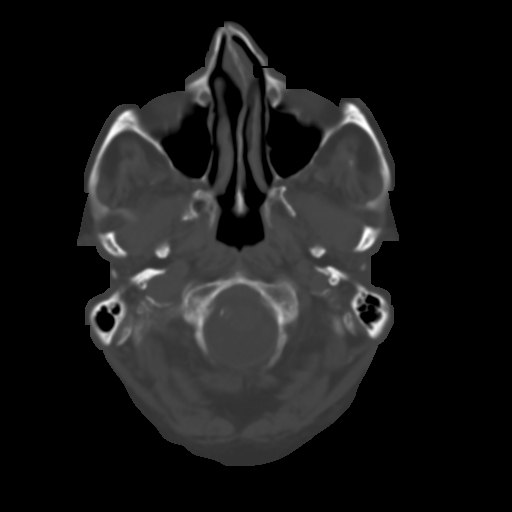
[im 4/32  brain]
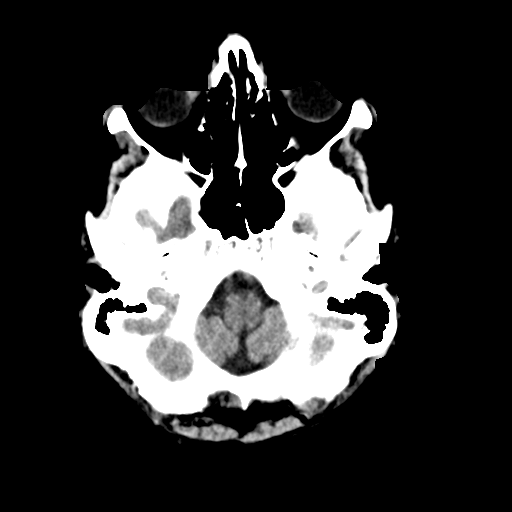
[im 6/32  brain]
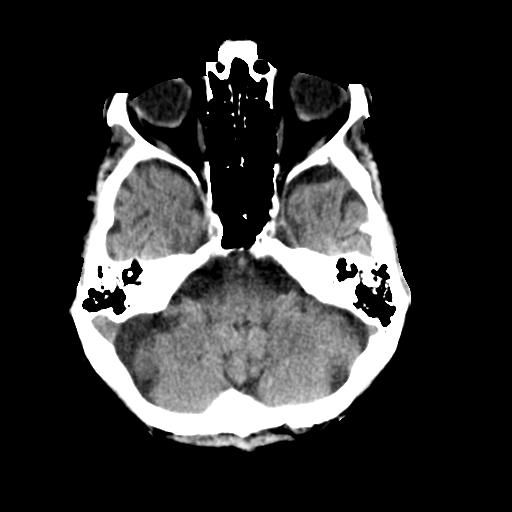
[im 8/32  brain]
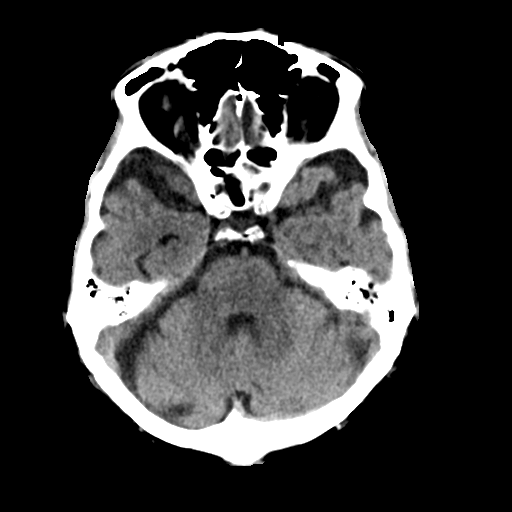
[im 9/32  brain]
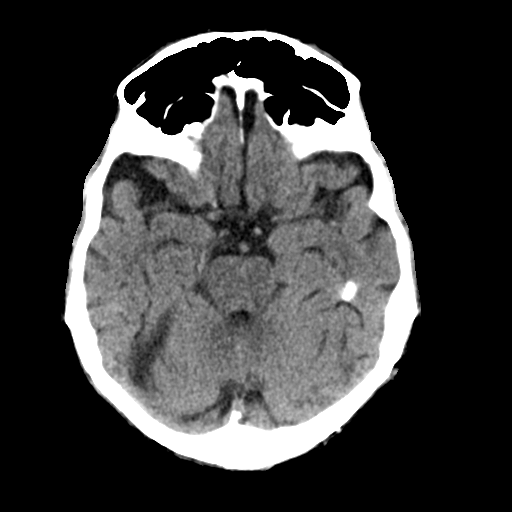
[im 9/32  bone]
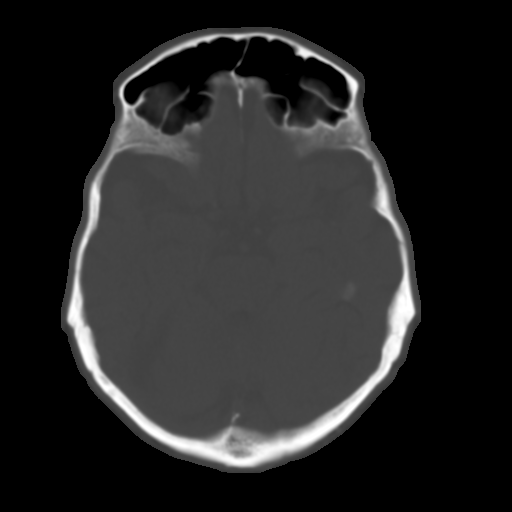
[im 11/32  brain]
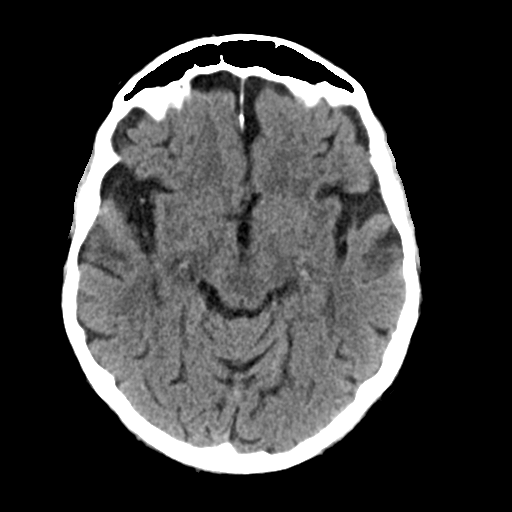
[im 13/32  brain]
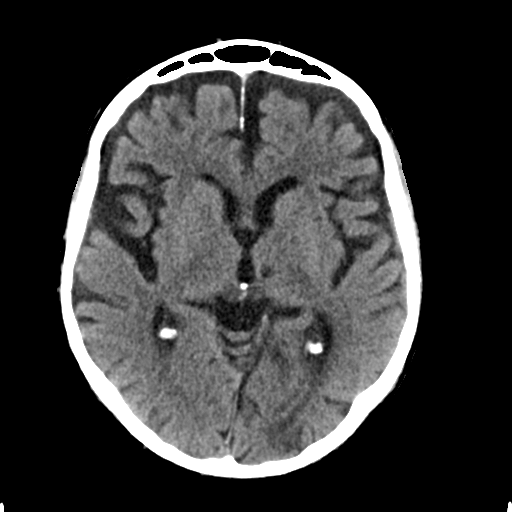
[im 15/32  brain]
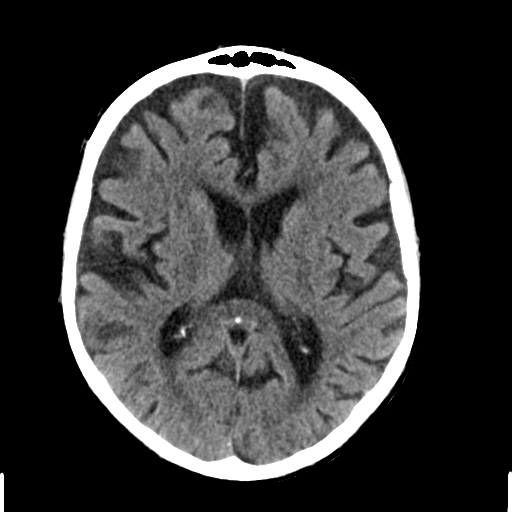
[im 17/32  brain]
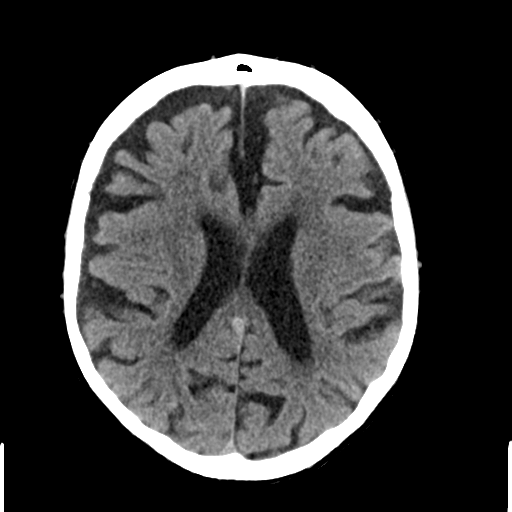
[im 17/32  bone]
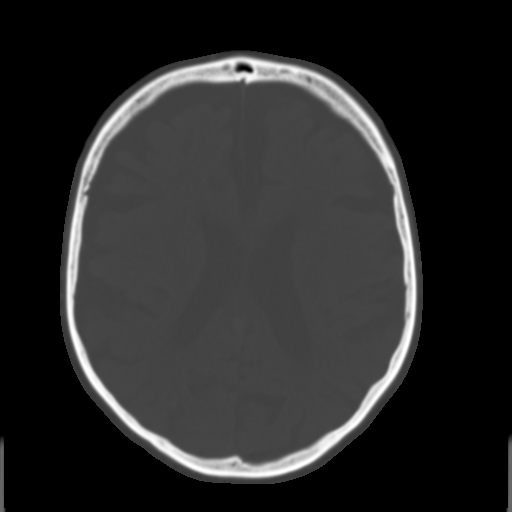
[im 19/32  brain]
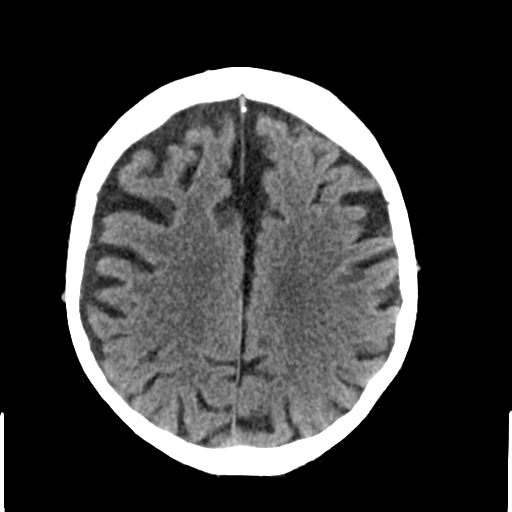
[im 21/32  brain]
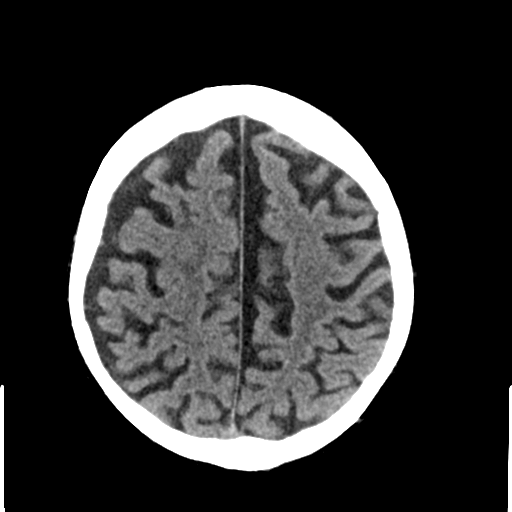
[im 23/32  brain]
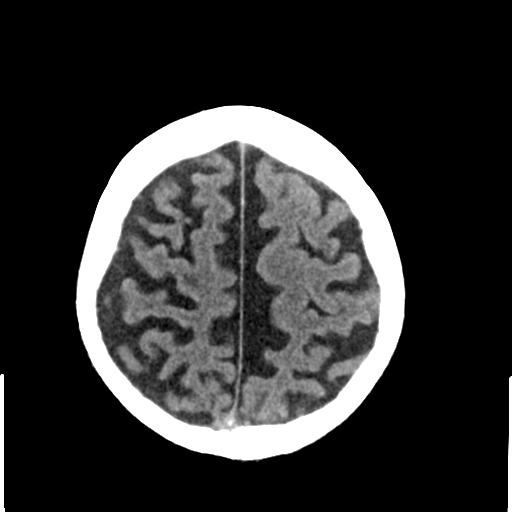
[im 24/32  brain]
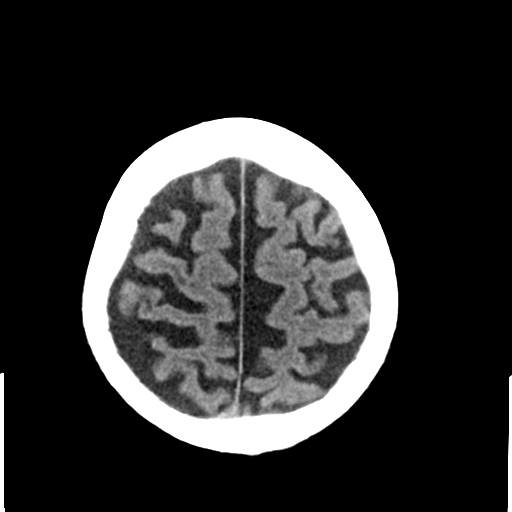
[im 24/32  bone]
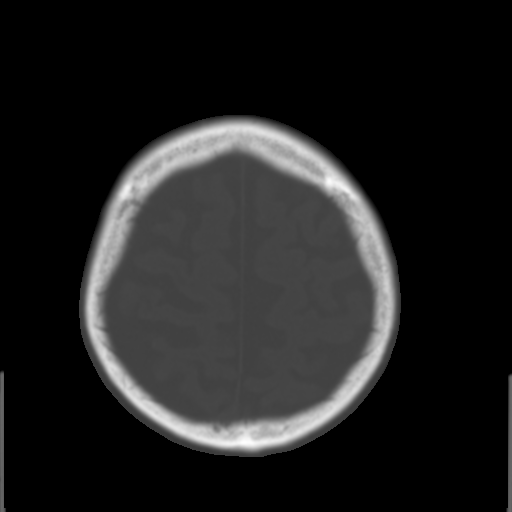
[im 26/32  brain]
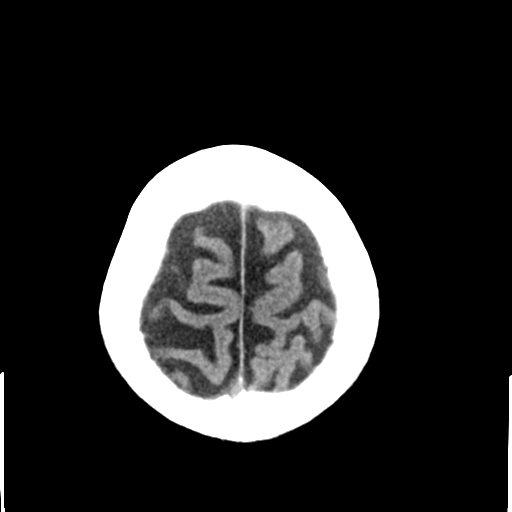
[im 28/32  brain]
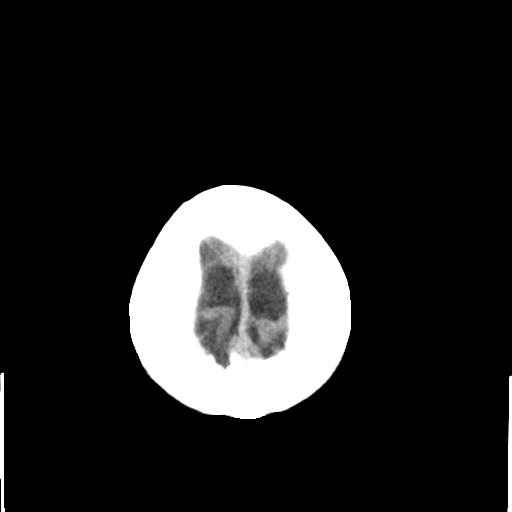
[im 30/32  brain]
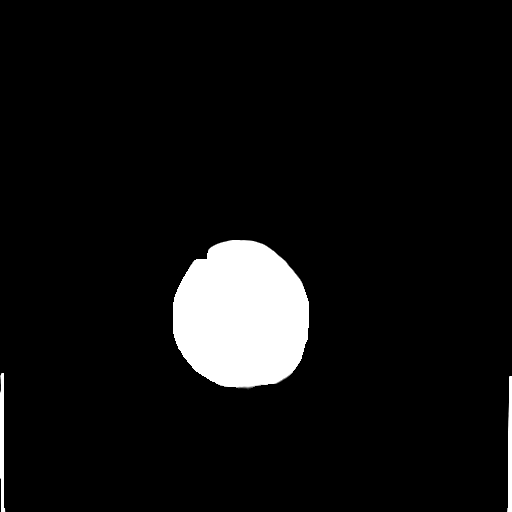

[16 of 30 positions shown; findings below may reference images not displayed]

FINDINGS: Diffuse cerebral atrophy. Ventricular dilatation consistent with
central atrophy. Low-attenuation changes in the deep white matter
consistent with small vessel ischemia. Small focal area of
encephalomalacia in the left occipital region consistent with old
infarct. No mass effect or midline shift. No abnormal extra-axial
fluid collections. Gray-white matter junctions are distinct. Basal
cisterns are not effaced. No evidence of acute intracranial
hemorrhage. No depressed skull fractures. Visualized paranasal
sinuses and mastoid air cells are not opacified.
IMPRESSION: No acute intracranial abnormalities. Chronic atrophy and small
vessel ischemic changes. Probable old left occipital infarct.

## 2017-08-23 IMAGING — DX DG CHEST 1V PORT
1 series · 1 of 1 positions shown · non-contrast
Comparison: 06/08/2015

CLINICAL DATA: Line placement

EXAM:
PORTABLE CHEST 1 VIEW

[chest ap]
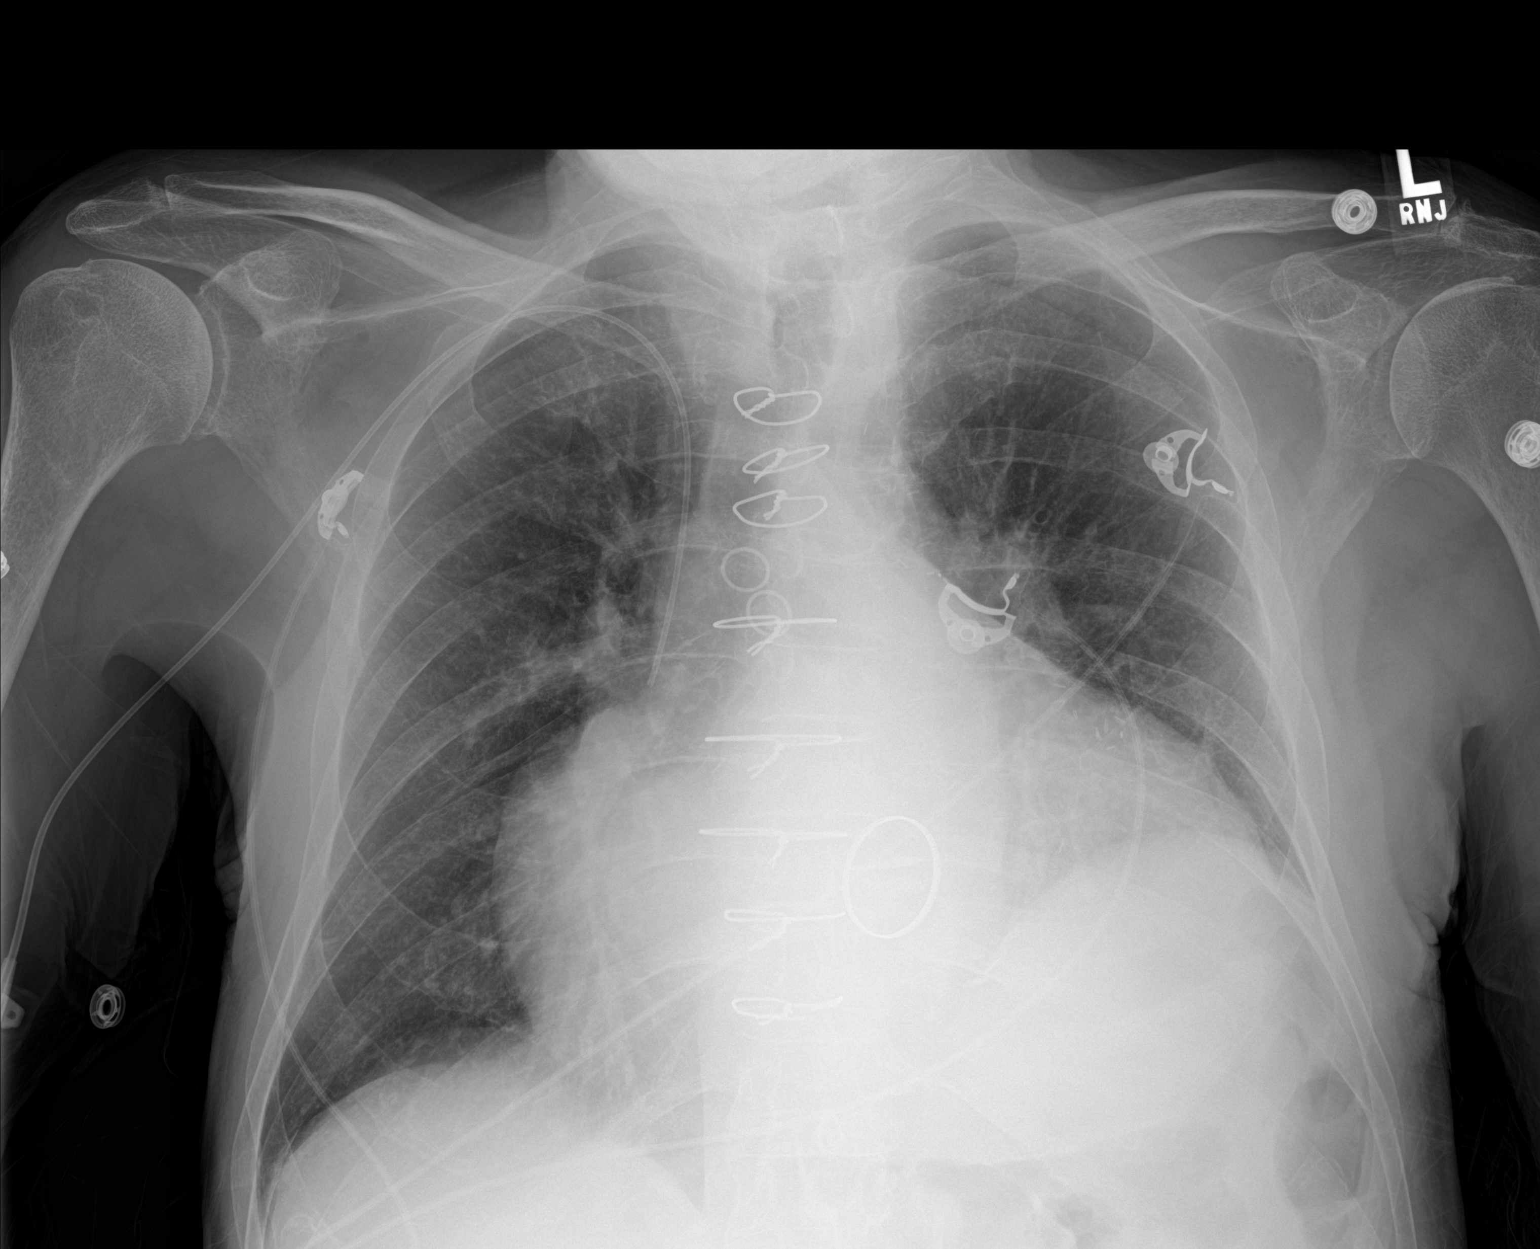

[1 of 1 positions shown; findings below may reference images not displayed]

FINDINGS: Right upper extremity PICC placed. Tip is at the cavoatrial
junction. Severe cardiomegaly is unchanged. Left hemidiaphragm
remains elevated. Lungs remain grossly clear. Vascular congestion
has nearly resolved.
IMPRESSION: Right upper extremity PICC placed with its tip at the cavoatrial
junction. Vascular congestion nearly resolved.
